# Patient Record
Sex: Female | Born: 1985 | Race: White | Hispanic: No | Marital: Married | Smoking: Never smoker
Health system: Southern US, Community
[De-identification: ages and names within clinical notes are randomized; demographics above are authoritative.]

## PROBLEM LIST (undated history)

## (undated) ENCOUNTER — Inpatient Hospital Stay: Payer: Self-pay

## (undated) DIAGNOSIS — R001 Bradycardia, unspecified: Secondary | ICD-10-CM

## (undated) DIAGNOSIS — D649 Anemia, unspecified: Secondary | ICD-10-CM

## (undated) DIAGNOSIS — I10 Essential (primary) hypertension: Secondary | ICD-10-CM

## (undated) DIAGNOSIS — E119 Type 2 diabetes mellitus without complications: Secondary | ICD-10-CM

## (undated) DIAGNOSIS — R55 Syncope and collapse: Secondary | ICD-10-CM

## (undated) DIAGNOSIS — K56609 Unspecified intestinal obstruction, unspecified as to partial versus complete obstruction: Secondary | ICD-10-CM

## (undated) DIAGNOSIS — R519 Headache, unspecified: Secondary | ICD-10-CM

## (undated) DIAGNOSIS — G4730 Sleep apnea, unspecified: Secondary | ICD-10-CM

## (undated) DIAGNOSIS — K432 Incisional hernia without obstruction or gangrene: Secondary | ICD-10-CM

## (undated) DIAGNOSIS — R42 Dizziness and giddiness: Secondary | ICD-10-CM

## (undated) DIAGNOSIS — I959 Hypotension, unspecified: Secondary | ICD-10-CM

## (undated) DIAGNOSIS — G039 Meningitis, unspecified: Secondary | ICD-10-CM

## (undated) DIAGNOSIS — E7800 Pure hypercholesterolemia, unspecified: Secondary | ICD-10-CM

## (undated) DIAGNOSIS — Z87442 Personal history of urinary calculi: Secondary | ICD-10-CM

## (undated) DIAGNOSIS — F32A Depression, unspecified: Secondary | ICD-10-CM

## (undated) DIAGNOSIS — E282 Polycystic ovarian syndrome: Secondary | ICD-10-CM

## (undated) DIAGNOSIS — R51 Headache: Secondary | ICD-10-CM

## (undated) DIAGNOSIS — F329 Major depressive disorder, single episode, unspecified: Secondary | ICD-10-CM

## (undated) HISTORY — DX: Bradycardia, unspecified: R00.1

## (undated) HISTORY — PX: DIAGNOSTIC LAPAROSCOPY: SUR761

## (undated) HISTORY — PX: VENTRAL HERNIA REPAIR: SHX424

## (undated) HISTORY — PX: CHOLECYSTECTOMY: SHX55

## (undated) HISTORY — PX: COLON SURGERY: SHX602

## (undated) HISTORY — PX: TONSILLECTOMY: SUR1361

## (undated) HISTORY — PX: OOPHORECTOMY: SHX86

## (undated) HISTORY — PX: TONSILECTOMY, ADENOIDECTOMY, BILATERAL MYRINGOTOMY AND TUBES: SHX2538

## (undated) HISTORY — DX: Syncope and collapse: R55

## (undated) HISTORY — PX: HERNIA REPAIR: SHX51

---

## 2006-07-09 DIAGNOSIS — O1490 Unspecified pre-eclampsia, unspecified trimester: Secondary | ICD-10-CM

## 2007-07-27 ENCOUNTER — Other Ambulatory Visit: Payer: Self-pay

## 2007-07-27 ENCOUNTER — Emergency Department: Payer: Self-pay | Admitting: Emergency Medicine

## 2008-07-24 ENCOUNTER — Encounter: Payer: Self-pay | Admitting: Sports Medicine

## 2008-08-03 ENCOUNTER — Encounter: Payer: Self-pay | Admitting: Sports Medicine

## 2008-09-02 ENCOUNTER — Encounter: Payer: Self-pay | Admitting: Sports Medicine

## 2008-10-18 ENCOUNTER — Emergency Department: Payer: Self-pay | Admitting: Emergency Medicine

## 2009-05-28 ENCOUNTER — Emergency Department: Payer: Self-pay | Admitting: Emergency Medicine

## 2009-06-19 ENCOUNTER — Emergency Department: Payer: Self-pay | Admitting: Emergency Medicine

## 2009-06-26 ENCOUNTER — Emergency Department: Payer: Self-pay | Admitting: Unknown Physician Specialty

## 2009-09-19 ENCOUNTER — Emergency Department: Payer: Self-pay | Admitting: Emergency Medicine

## 2010-09-24 ENCOUNTER — Emergency Department: Payer: Self-pay | Admitting: Emergency Medicine

## 2011-03-06 ENCOUNTER — Emergency Department: Payer: Self-pay | Admitting: Emergency Medicine

## 2011-03-10 ENCOUNTER — Emergency Department: Payer: Self-pay

## 2011-05-15 ENCOUNTER — Emergency Department: Payer: Self-pay | Admitting: *Deleted

## 2011-06-25 ENCOUNTER — Emergency Department: Payer: Self-pay | Admitting: Emergency Medicine

## 2011-06-25 LAB — CBC
HCT: 46.4 % (ref 35.0–47.0)
HGB: 15.7 g/dL (ref 12.0–16.0)
MCH: 30.5 pg (ref 26.0–34.0)
MCV: 90 fL (ref 80–100)
RDW: 12.2 % (ref 11.5–14.5)
WBC: 8.1 10*3/uL (ref 3.6–11.0)

## 2011-06-25 LAB — HCG, QUANTITATIVE, PREGNANCY: Beta Hcg, Quant.: <1 m[IU]/mL — ABNORMAL LOW

## 2011-06-25 LAB — COMPREHENSIVE METABOLIC PANEL
Alkaline Phosphatase: 99 U/L (ref 50–136)
Bilirubin,Total: 0.5 mg/dL (ref 0.2–1.0)
Chloride: 101 mmol/L (ref 98–107)
Co2: 27 mmol/L (ref 21–32)
Creatinine: 0.75 mg/dL (ref 0.60–1.30)
EGFR (African American): >60
EGFR (Non-African Amer.): >60
Glucose: 290 mg/dL — ABNORMAL HIGH (ref 65–99)
SGPT (ALT): 76 U/L

## 2011-06-25 LAB — URINALYSIS, COMPLETE
Bilirubin,UR: NEGATIVE
Ketone: NEGATIVE
Ph: 5 (ref 4.5–8.0)
Protein: 30
Specific Gravity: 1.026 (ref 1.003–1.030)
Squamous Epithelial: 14

## 2011-07-29 ENCOUNTER — Emergency Department: Payer: Self-pay | Admitting: Emergency Medicine

## 2011-07-29 LAB — URINALYSIS, COMPLETE
Bacteria: NONE SEEN
Bilirubin,UR: NEGATIVE
Ph: 6 (ref 4.5–8.0)
Squamous Epithelial: 3
WBC UR: 10 /HPF (ref 0–5)

## 2011-07-29 LAB — CBC
HGB: 15.1 g/dL (ref 12.0–16.0)
MCH: 30.6 pg (ref 26.0–34.0)
MCHC: 34.1 g/dL (ref 32.0–36.0)
Platelet: 343 10*3/uL (ref 150–440)

## 2011-07-29 LAB — COMPREHENSIVE METABOLIC PANEL
Alkaline Phosphatase: 101 U/L (ref 50–136)
BUN: 10 mg/dL (ref 7–18)
Bilirubin,Total: 0.4 mg/dL (ref 0.2–1.0)
Co2: 25 mmol/L (ref 21–32)
Creatinine: 0.63 mg/dL (ref 0.60–1.30)
EGFR (Non-African Amer.): >60
Glucose: 290 mg/dL — ABNORMAL HIGH (ref 65–99)
Potassium: 3.7 mmol/L (ref 3.5–5.1)
SGPT (ALT): 83 U/L — ABNORMAL HIGH
Total Protein: 7.5 g/dL (ref 6.4–8.2)

## 2011-07-29 LAB — PREGNANCY, URINE: Pregnancy Test, Urine: NEGATIVE m[IU]/mL

## 2011-09-07 ENCOUNTER — Emergency Department: Payer: Self-pay | Admitting: Unknown Physician Specialty

## 2012-07-28 DIAGNOSIS — E785 Hyperlipidemia, unspecified: Secondary | ICD-10-CM | POA: Insufficient documentation

## 2012-08-23 ENCOUNTER — Ambulatory Visit: Payer: Self-pay | Admitting: Internal Medicine

## 2012-09-14 DIAGNOSIS — G4733 Obstructive sleep apnea (adult) (pediatric): Secondary | ICD-10-CM | POA: Insufficient documentation

## 2013-01-31 ENCOUNTER — Emergency Department: Payer: Self-pay | Admitting: Emergency Medicine

## 2013-02-07 ENCOUNTER — Emergency Department: Payer: Self-pay | Admitting: Emergency Medicine

## 2013-02-08 LAB — URINALYSIS, COMPLETE
Bilirubin,UR: NEGATIVE
Glucose,UR: >=500 mg/dL
Ketone: NEGATIVE
Leukocyte Esterase: NEGATIVE
Nitrite: NEGATIVE
Ph: 5
Protein: 30
RBC,UR: 4 /HPF
Specific Gravity: 1.033
Squamous Epithelial: 11
WBC UR: 4 /HPF

## 2013-03-04 DIAGNOSIS — IMO0002 Reserved for concepts with insufficient information to code with codable children: Secondary | ICD-10-CM | POA: Insufficient documentation

## 2013-05-05 DIAGNOSIS — G039 Meningitis, unspecified: Secondary | ICD-10-CM

## 2013-05-05 HISTORY — DX: Meningitis, unspecified: G03.9

## 2013-06-22 ENCOUNTER — Emergency Department: Payer: Self-pay | Admitting: Emergency Medicine

## 2013-07-10 DIAGNOSIS — R519 Headache, unspecified: Secondary | ICD-10-CM | POA: Insufficient documentation

## 2013-07-15 DIAGNOSIS — I10 Essential (primary) hypertension: Secondary | ICD-10-CM | POA: Insufficient documentation

## 2013-07-22 DIAGNOSIS — F329 Major depressive disorder, single episode, unspecified: Secondary | ICD-10-CM | POA: Insufficient documentation

## 2013-07-22 DIAGNOSIS — E039 Hypothyroidism, unspecified: Secondary | ICD-10-CM | POA: Insufficient documentation

## 2014-04-07 DIAGNOSIS — H6983 Other specified disorders of Eustachian tube, bilateral: Secondary | ICD-10-CM | POA: Insufficient documentation

## 2014-04-07 DIAGNOSIS — IMO0001 Reserved for inherently not codable concepts without codable children: Secondary | ICD-10-CM | POA: Insufficient documentation

## 2014-04-07 DIAGNOSIS — G43909 Migraine, unspecified, not intractable, without status migrainosus: Secondary | ICD-10-CM | POA: Insufficient documentation

## 2014-05-25 DIAGNOSIS — N946 Dysmenorrhea, unspecified: Secondary | ICD-10-CM | POA: Insufficient documentation

## 2014-05-25 DIAGNOSIS — K625 Hemorrhage of anus and rectum: Secondary | ICD-10-CM | POA: Insufficient documentation

## 2014-06-26 ENCOUNTER — Emergency Department: Payer: Self-pay | Admitting: Emergency Medicine

## 2014-06-26 ENCOUNTER — Emergency Department (HOSPITAL_COMMUNITY)
Admission: EM | Admit: 2014-06-26 | Discharge: 2014-06-26 | Disposition: A | Discharge status: Home / Self Care | Payer: BC Managed Care – PPO | Attending: Emergency Medicine | Admitting: Emergency Medicine

## 2014-06-26 ENCOUNTER — Encounter (HOSPITAL_COMMUNITY): Payer: Self-pay | Admitting: Emergency Medicine

## 2014-06-26 ENCOUNTER — Emergency Department (HOSPITAL_COMMUNITY): Payer: BC Managed Care – PPO

## 2014-06-26 DIAGNOSIS — Z794 Long term (current) use of insulin: Secondary | ICD-10-CM | POA: Insufficient documentation

## 2014-06-26 DIAGNOSIS — R1031 Right lower quadrant pain: Secondary | ICD-10-CM | POA: Diagnosis not present

## 2014-06-26 DIAGNOSIS — Z79899 Other long term (current) drug therapy: Secondary | ICD-10-CM | POA: Diagnosis not present

## 2014-06-26 DIAGNOSIS — Z880 Allergy status to penicillin: Secondary | ICD-10-CM | POA: Insufficient documentation

## 2014-06-26 DIAGNOSIS — Z9889 Other specified postprocedural states: Secondary | ICD-10-CM | POA: Diagnosis not present

## 2014-06-26 DIAGNOSIS — R109 Unspecified abdominal pain: Secondary | ICD-10-CM | POA: Diagnosis present

## 2014-06-26 DIAGNOSIS — Z9049 Acquired absence of other specified parts of digestive tract: Secondary | ICD-10-CM | POA: Diagnosis not present

## 2014-06-26 DIAGNOSIS — E119 Type 2 diabetes mellitus without complications: Secondary | ICD-10-CM | POA: Insufficient documentation

## 2014-06-26 HISTORY — DX: Type 2 diabetes mellitus without complications: E11.9

## 2014-06-26 LAB — CBC WITH DIFFERENTIAL/PLATELET
BASOS PCT: 0 % (ref 0–1)
Basophils Absolute: 0 10*3/uL (ref 0.0–0.1)
Eosinophils Absolute: 0.2 10*3/uL (ref 0.0–0.7)
Eosinophils Relative: 3 % (ref 0–5)
HCT: 38.9 % (ref 36.0–46.0)
HEMOGLOBIN: 13.7 g/dL (ref 12.0–15.0)
LYMPHS ABS: 3.4 10*3/uL (ref 0.7–4.0)
Lymphocytes Relative: 36 % (ref 12–46)
MCH: 30.2 pg (ref 26.0–34.0)
MCHC: 35.2 g/dL (ref 30.0–36.0)
MCV: 85.9 fL (ref 78.0–100.0)
MONO ABS: 0.6 10*3/uL (ref 0.1–1.0)
MONOS PCT: 6 % (ref 3–12)
Neutro Abs: 5.2 10*3/uL (ref 1.7–7.7)
Neutrophils Relative %: 55 % (ref 43–77)
Platelets: 359 10*3/uL (ref 150–400)
RBC: 4.53 MIL/uL (ref 3.87–5.11)
RDW: 12.4 % (ref 11.5–15.5)
WBC: 9.4 10*3/uL (ref 4.0–10.5)

## 2014-06-26 LAB — URINALYSIS, ROUTINE W REFLEX MICROSCOPIC
Bilirubin Urine: NEGATIVE
GLUCOSE, UA: 250 mg/dL — AB
HGB URINE DIPSTICK: NEGATIVE
KETONES UR: NEGATIVE mg/dL
LEUKOCYTES UA: NEGATIVE
Nitrite: NEGATIVE
Protein, ur: NEGATIVE mg/dL
Specific Gravity, Urine: 1.025 (ref 1.005–1.030)
UROBILINOGEN UA: 1 mg/dL (ref 0.0–1.0)
pH: 5.5 (ref 5.0–8.0)

## 2014-06-26 LAB — LIPASE, BLOOD: LIPASE: 23 U/L (ref 11–59)

## 2014-06-26 MED ORDER — HYDROCODONE-ACETAMINOPHEN 5-325 MG PO TABS: 1.0000 | ORAL_TABLET | Freq: Four times a day (QID) | ORAL | Status: DC | PRN

## 2014-06-26 MED ORDER — IOHEXOL 300 MG/ML  SOLN
100.0000 mL | Freq: Once | INTRAMUSCULAR | Status: AC | PRN
  Administered 2014-06-26: 100 mL via INTRAVENOUS

## 2014-06-26 MED ORDER — SODIUM CHLORIDE 0.9 % IV BOLUS (SEPSIS)
1000.0000 mL | Freq: Once | INTRAVENOUS | Status: AC
  Administered 2014-06-26: 1000 mL via INTRAVENOUS

## 2014-06-26 MED ORDER — IOHEXOL 300 MG/ML  SOLN
25.0000 mL | Freq: Once | INTRAMUSCULAR | Status: AC | PRN
  Administered 2014-06-26: 25 mL via ORAL

## 2014-06-26 MED ORDER — PROMETHAZINE HCL 25 MG PO TABS: 25.0000 mg | ORAL_TABLET | Freq: Three times a day (TID) | ORAL | Status: DC | PRN

## 2014-06-26 NOTE — ED Provider Notes (Signed)
CSN: [id number]     Arrival date & time 06/26/14  1120 History   First MD Initiated Contact with Patient 06/26/14 1214     Chief Complaint  Patient presents with  . Abdominal Pain     (Consider location/radiation/quality/duration/timing/severity/associated sxs/prior Treatment) HPI Patient presents to the emergency department with right-sided abdominal pain that started yesterday.  The patient states [facility] and then went to [facility] this morning.  She was sent here due to the fact that the CT scanner at [facility] does not support her weight.  Patient is transferred in by EMS.  Patient states that she has not had any chest pain, shortness of breath, vomiting,weakness, dizziness, headache, blurred vision, fever, back pain, dysuria, hematemesis, bloody stool, rash, or syncope.  Patient states that she did not take any medications prior to arrival and seems make her condition better, but palpation makes the pain worse Past Medical History  Diagnosis Date  . Diabetes mellitus without complication    Past Surgical History  Procedure Laterality Date  . Cholecystectomy    . Cesarean section    . Tonsillectomy     No family history on file. History  Substance Use Topics  . Smoking status: Never Smoker   . Smokeless tobacco: Not on file  . Alcohol Use: No   OB History    No data available     Review of Systems  All other systems negative except as documented in the HPI. All pertinent positives and negatives as reviewed in the HPI.  Allergies  Cefdinir and Penicillins  Home Medications   Prior to Admission medications   Medication Sig Start Date End Date Taking? Authorizing Provider  etonogestrel (NEXPLANON) 68 MG IMPL implant 1 each by Subdermal route once.   Yes Historical Provider, MD  insulin glargine (LANTUS) 100 UNIT/ML injection Inject 64 Units into the skin at bedtime.   Yes Historical  Provider, MD  metFORMIN (GLUCOPHAGE) 500 MG tablet Take 500 mg by mouth 2 (two) times daily with a meal.   Yes Historical Provider, MD  propranolol (INDERAL) 20 MG tablet Take 10 mg by mouth 2 (two) times daily.   Yes Historical Provider, MD  sertraline (ZOLOFT) 100 MG tablet Take 200 mg by mouth daily.   Yes Historical Provider, MD   BP 124/53 mmHg  Pulse 63  Temp(Src) 99.1 F (37.3 C) (Oral)  Resp 16  Ht 6\' 1"  (1.854 m)  Wt 404 lb (183.253 kg)  BMI 53.31 kg/m2  SpO2 97%  LMP 06/14/2014 Physical Exam  Constitutional: She is oriented to person, place, and time. She appears well-developed and well-nourished. No distress.  HENT:  Head: Normocephalic and atraumatic.  Mouth/Throat: Oropharynx is clear and moist.  Eyes: Pupils are equal, round, and reactive to light.  Neck: Normal range of motion. Neck supple.  Cardiovascular: Normal rate and regular rhythm.   Pulmonary/Chest: Effort normal and breath sounds normal. No respiratory distress. She has no wheezes.  Abdominal: Soft. Bowel sounds are normal. She exhibits no distension. There is tenderness. There is no rebound and no guarding.  Musculoskeletal: She exhibits no edema.  Neurological: She is alert and oriented to person, place, and time. She exhibits normal muscle tone. Coordination normal.  Skin: Skin is warm and dry. No rash noted. No erythema.  Nursing note and vitals reviewed.   ED Course  Procedures (including critical care time) Labs Review Labs Reviewed  URINALYSIS, ROUTINE W REFLEX MICROSCOPIC -  Abnormal; Notable for the following:    Glucose, UA 250 (*)    All other components within normal limits  CBC WITH DIFFERENTIAL/PLATELET  LIPASE, BLOOD    Imaging Review Ct Abdomen Pelvis W Contrast  06/26/2014   CLINICAL DATA:  Acute right lower quadrant abdominal pain.  EXAM: CT ABDOMEN AND PELVIS WITH CONTRAST  TECHNIQUE: Multidetector CT imaging of the abdomen and pelvis was performed using the standard protocol  following bolus administration of intravenous contrast.  CONTRAST:  OMNIPAQUE IOHEXOL 300 MG/ML  SOLN  COMPARISON:  CT scan of October 19, 2008.  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 Visualized lung bases appear normal.  Status post cholecystectomy. Fatty infiltration of the liver is noted. The spleen and pancreas appear normal. Adrenal glands and kidneys appear normal. No hydronephrosis or renal obstruction is noted. Appendix appears normal. There is no evidence of bowel obstruction. No abnormal fluid collection is noted. Uterus and ovaries appear normal. Urinary bladder is decompressed. No significant adenopathy is noted.  IMPRESSION: Fatty infiltration of the liver. No other significant abnormality seen in the abdomen or pelvis.   Electronically Signed   By: Lupita Raider, M.D.   On: 06/26/2014 14:42    The patient will be discharged home with follow-up with her primary care Dr. told to return here as needed.  I advised the patient.  Increase her fluid intake and rest as much possible and is negative CT scan findings and normal laboratory values here in the emergency department.  I reviewed her testing from her visit to [facility] and her laboratory testing was normal at that time as well  MDM   Final diagnoses:  RLQ abdominal pain      Carlyle Dolly, PA-C 06/26/14 1459  Richardean Canal, MD 06/26/14 [phone]

## 2014-06-26 NOTE — ED Notes (Signed)
Family at bedside. 

## 2014-06-26 NOTE — Discharge Instructions (Signed)
Return here as needed.  Follow-up with your primary care doctor.  The CT scan did not show any significant abnormalities °

## 2014-06-26 NOTE — ED Notes (Signed)
Pt in cT

## 2014-06-26 NOTE — ED Notes (Signed)
PT REQUEST md TO COME TO BEDSIDE BEFORE d/c

## 2014-06-26 NOTE — ED Notes (Signed)
Pt was sent here from [facility] for a CT Scan. PT started having RLQ pain this AM with nausea and diarrhea x 10. Denies any vomiting.

## 2014-06-26 NOTE — ED Notes (Signed)
Pt up ambulatory to the bathroom to attempt an urine specimen

## 2014-07-14 ENCOUNTER — Ambulatory Visit: Payer: Self-pay | Admitting: Gastroenterology

## 2014-08-28 LAB — SURGICAL PATHOLOGY

## 2015-02-12 ENCOUNTER — Other Ambulatory Visit: Payer: Self-pay | Admitting: Otolaryngology

## 2015-02-12 DIAGNOSIS — H9319 Tinnitus, unspecified ear: Secondary | ICD-10-CM

## 2015-02-16 ENCOUNTER — Ambulatory Visit: Payer: BC Managed Care – PPO

## 2015-02-20 ENCOUNTER — Ambulatory Visit
Admission: RE | Admit: 2015-02-20 | Discharge: 2015-02-20 | Disposition: A | Discharge status: Home / Self Care | Payer: BC Managed Care – PPO | Source: Ambulatory Visit | Attending: Otolaryngology | Admitting: Otolaryngology

## 2015-02-20 DIAGNOSIS — R42 Dizziness and giddiness: Secondary | ICD-10-CM | POA: Diagnosis present

## 2015-02-20 DIAGNOSIS — R51 Headache: Secondary | ICD-10-CM | POA: Insufficient documentation

## 2015-02-20 DIAGNOSIS — H9319 Tinnitus, unspecified ear: Secondary | ICD-10-CM | POA: Insufficient documentation

## 2015-03-27 DIAGNOSIS — E119 Type 2 diabetes mellitus without complications: Secondary | ICD-10-CM | POA: Insufficient documentation

## 2015-04-05 HISTORY — PX: GASTRIC BYPASS: SHX52

## 2015-04-11 ENCOUNTER — Other Ambulatory Visit: Payer: Self-pay | Admitting: Specialist

## 2015-04-16 ENCOUNTER — Other Ambulatory Visit: Payer: Self-pay | Admitting: Specialist

## 2015-04-16 ENCOUNTER — Ambulatory Visit
Admission: RE | Admit: 2015-04-16 | Discharge: 2015-04-16 | Disposition: A | Discharge status: Home / Self Care | Payer: BC Managed Care – PPO | Source: Ambulatory Visit | Attending: Specialist | Admitting: Specialist

## 2015-04-20 DIAGNOSIS — E559 Vitamin D deficiency, unspecified: Secondary | ICD-10-CM | POA: Insufficient documentation

## 2015-08-30 DIAGNOSIS — Z8679 Personal history of other diseases of the circulatory system: Secondary | ICD-10-CM | POA: Insufficient documentation

## 2015-08-30 DIAGNOSIS — Z8742 Personal history of other diseases of the female genital tract: Secondary | ICD-10-CM | POA: Insufficient documentation

## 2015-09-13 DIAGNOSIS — Z9884 Bariatric surgery status: Secondary | ICD-10-CM | POA: Insufficient documentation

## 2015-09-20 ENCOUNTER — Encounter: Payer: Self-pay | Admitting: Emergency Medicine

## 2015-09-20 ENCOUNTER — Emergency Department
Admission: EM | Admit: 2015-09-20 | Discharge: 2015-09-20 | Disposition: A | Discharge status: Home / Self Care | Payer: BC Managed Care – PPO | Attending: Emergency Medicine | Admitting: Emergency Medicine

## 2015-09-20 ENCOUNTER — Emergency Department: Payer: BC Managed Care – PPO

## 2015-09-20 DIAGNOSIS — Z7984 Long term (current) use of oral hypoglycemic drugs: Secondary | ICD-10-CM | POA: Diagnosis not present

## 2015-09-20 DIAGNOSIS — Z79899 Other long term (current) drug therapy: Secondary | ICD-10-CM | POA: Diagnosis not present

## 2015-09-20 DIAGNOSIS — Z794 Long term (current) use of insulin: Secondary | ICD-10-CM | POA: Diagnosis not present

## 2015-09-20 DIAGNOSIS — R102 Pelvic and perineal pain: Secondary | ICD-10-CM

## 2015-09-20 DIAGNOSIS — O26831 Pregnancy related renal disease, first trimester: Secondary | ICD-10-CM | POA: Diagnosis not present

## 2015-09-20 DIAGNOSIS — N1330 Unspecified hydronephrosis: Secondary | ICD-10-CM | POA: Insufficient documentation

## 2015-09-20 DIAGNOSIS — Z3A01 Less than 8 weeks gestation of pregnancy: Secondary | ICD-10-CM | POA: Insufficient documentation

## 2015-09-20 DIAGNOSIS — R319 Hematuria, unspecified: Secondary | ICD-10-CM | POA: Insufficient documentation

## 2015-09-20 DIAGNOSIS — R300 Dysuria: Secondary | ICD-10-CM

## 2015-09-20 DIAGNOSIS — E119 Type 2 diabetes mellitus without complications: Secondary | ICD-10-CM | POA: Insufficient documentation

## 2015-09-20 LAB — COMPREHENSIVE METABOLIC PANEL
ALT: 19 U/L (ref 14–54)
AST: 18 U/L (ref 15–41)
Albumin: 4.4 g/dL (ref 3.5–5.0)
Alkaline Phosphatase: 62 U/L (ref 38–126)
Anion gap: 7 (ref 5–15)
BILIRUBIN TOTAL: 0.7 mg/dL (ref 0.3–1.2)
BUN: 11 mg/dL (ref 6–20)
CHLORIDE: 109 mmol/L (ref 101–111)
CO2: 23 mmol/L (ref 22–32)
CREATININE: 0.43 mg/dL — AB (ref 0.44–1.00)
Calcium: 9.1 mg/dL (ref 8.9–10.3)
Glucose, Bld: 87 mg/dL (ref 65–99)
POTASSIUM: 3.3 mmol/L — AB (ref 3.5–5.1)
Sodium: 139 mmol/L (ref 135–145)
TOTAL PROTEIN: 7.1 g/dL (ref 6.5–8.1)

## 2015-09-20 LAB — HCG, QUANTITATIVE, PREGNANCY: hCG, Beta Chain, Quant, S: 13487 m[IU]/mL — ABNORMAL HIGH (ref <5)

## 2015-09-20 LAB — WET PREP, GENITAL
Clue Cells Wet Prep HPF POC: NONE SEEN
SPERM: NONE SEEN
Trich, Wet Prep: NONE SEEN
YEAST WET PREP: NONE SEEN

## 2015-09-20 LAB — CBC
HCT: 37.6 % (ref 35.0–47.0)
Hemoglobin: 12.6 g/dL (ref 12.0–16.0)
MCH: 29.9 pg (ref 26.0–34.0)
MCHC: 33.4 g/dL (ref 32.0–36.0)
MCV: 89.5 fL (ref 80.0–100.0)
PLATELETS: 406 10*3/uL (ref 150–440)
RBC: 4.21 MIL/uL (ref 3.80–5.20)
RDW: 13 % (ref 11.5–14.5)
WBC: 10.5 10*3/uL (ref 3.6–11.0)

## 2015-09-20 LAB — CHLAMYDIA/NGC RT PCR (ARMC ONLY)
Chlamydia Tr: NOT DETECTED
N GONORRHOEAE: NOT DETECTED

## 2015-09-20 NOTE — ED Notes (Signed)
Pt sent here by PCP for possible ectopic. (about [redacted] weeks pregnant) Pt reports right lower abdominal pain and urinary frequency, reports dx with UTI. Pt reports very small amount of brownish-pink vaginal discharge.

## 2015-09-20 NOTE — Discharge Instructions (Signed)

## 2015-09-20 NOTE — ED Provider Notes (Addendum)
[facility] Emergency Department Provider Note   ____________________________________________  Time seen: Approximately 2:05 PM  I have reviewed the triage vital signs and the nursing notes.   HISTORY  Chief Complaint possible ectopic    HPI ANNELLA Fuller is a 30 y.o. 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 She says that the pain can last up to an hour and a half and is a maximum of 6 out of 10. 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 She says that she feels the urge to urinate but then goes to the bathroom and has minimal to no urine production. She has been on multiple antibiotics from her primary care doctor. She was just at her primary care doctors earlier today where she reported the right lower quadrant pain and was sent to the emergency department to rule out ectopic pregnancy. The patient is a G2 P1 without any history of miscarriage or sexually transmitted disease. She denies any pain at this time. Denies any nausea or vomiting. Says that she is also taking prenatal vitamins. York Spaniel that she was instructed to go to the emergency department to rule out the ectopic and then call her primary care doctor back if she was cleared that they may make the next steps in the decision-making about the dysuria. The patient showed me her latest urinalysis result which only showed blood and did not have any nitrite or leukocyte Estrace.She says the pain radiates through to her back. She says that she also has had this similar pain with her dysuria in the past which resolved. She says that she has been on multiple courses of antibiotics and that the symptoms do resolve after antibiotic treatment but always return. She says that she did not have any of these issues  prior to the surgery in December. Also with a small amount of pink to brownish blood after wiping this morning.  Past Medical History  Diagnosis Date  . Diabetes mellitus without complication (HCC)     There are no active problems to display for this patient.   Past Surgical History  Procedure Laterality Date  . Cholecystectomy    . Cesarean section    . Tonsillectomy      Current Outpatient Rx  Name  Route  Sig  Dispense  Refill  . etonogestrel (NEXPLANON) 68 MG IMPL implant   Subdermal   1 each by Subdermal route once.         Marland Kitchen HYDROcodone-acetaminophen (NORCO/VICODIN) 5-325 MG per tablet   Oral   Take 1 tablet by mouth every 6 (six) hours as needed for moderate pain.   15 tablet   0   . insulin glargine (LANTUS) 100 UNIT/ML injection   Subcutaneous   Inject 64 Units into the skin at bedtime.         . metFORMIN (GLUCOPHAGE) 500 MG tablet   Oral   Take 500 mg by mouth 2 (two) times daily with a meal.         . promethazine (PHENERGAN) 25 MG tablet   Oral   Take 1 tablet (25 mg total) by mouth every 8 (eight) hours as needed for nausea or vomiting.   15 tablet   0   . propranolol (INDERAL) 20 MG tablet   Oral  Take 10 mg by mouth 2 (two) times daily.         . sertraline (ZOLOFT) 100 MG tablet   Oral   Take 200 mg by mouth daily.           Allergies Cefdinir and Penicillins  No family history on file.  Social History Social History  Substance Use Topics  . Smoking status: Never Smoker   . Smokeless tobacco: None  . Alcohol Use: No    Review of Systems Constitutional: No fever/chills Eyes: No visual changes. ENT: No sore throat. Cardiovascular: Denies chest pain. Respiratory: Denies shortness of breath. Gastrointestinal:No nausea, no vomiting.  No diarrhea.  No constipation. Genitourinary: As above Musculoskeletal:As above Skin: Negative for rash. Neurological: Negative for headaches, focal weakness or numbness.  10-point ROS  otherwise negative.  ____________________________________________   PHYSICAL EXAM:  VITAL SIGNS: ED Triage Vitals  Enc Vitals Group     BP 09/20/15 1256 139/87 mmHg     Pulse Rate 09/20/15 1256 58     Resp 09/20/15 1256 16     Temp 09/20/15 1256 97.9 F (36.6 C)     Temp Source 09/20/15 1256 Oral     SpO2 09/20/15 1256 100 %     Weight 09/20/15 1256 287 lb (130.182 kg)     Height 09/20/15 1256 6\' 1"  (1.854 m)     Head Cir --      Peak Flow --      Pain Score 09/20/15 1257 6     Pain Loc --      Pain Edu? --      Excl. in GC? --     Constitutional: Alert and oriented. Well appearing and in no acute distress. Eyes: Conjunctivae are normal. PERRL. EOMI. Head: Atraumatic. Nose: No congestion/rhinnorhea. Mouth/Throat: Mucous membranes are moist.   Neck: No stridor. Cardiovascular: Normal rate, regular rhythm. Grossly normal heart sounds.   Respiratory: Normal respiratory effort.  No retractions. Lungs CTAB. Gastrointestinal: Soft and nontender. No distention.  No CVA tenderness. Genitourinary: Normal external examination. Speculum exam without any discharge or bleeding. Bimanual exam with a closed cervical os. No CMT. No uterine or adnexal tenderness or masses palpated. Musculoskeletal: No lower extremity tenderness nor edema.  No joint effusions. Neurologic:  Normal speech and language. No gross focal neurologic deficits are appreciated.  Skin:  Skin is warm, dry and intact. No rash noted. Psychiatric: Mood and affect are normal. Speech and behavior are normal.  ____________________________________________   LABS (all labs ordered are listed, but only abnormal results are displayed)  Labs Reviewed  WET PREP, GENITAL - Abnormal; Notable for the following:    WBC, Wet Prep HPF POC RARE (*)    All other components within normal limits  HCG, QUANTITATIVE, PREGNANCY - Abnormal; Notable for the following:    hCG, Beta Chain, Quant, S 13487 (*)    All other components within  normal limits  COMPREHENSIVE METABOLIC PANEL - Abnormal; Notable for the following:    Potassium 3.3 (*)    Creatinine, Ser 0.43 (*)    All other components within normal limits  CHLAMYDIA/NGC RT PCR ([facility] ONLY)  CBC  URINALYSIS COMPLETEWITH MICROSCOPIC ([facility] ONLY)  ABO/RH   ____________________________________________  EKG   ____________________________________________  RADIOLOGY   [place] Art/Ven Flow Abd Pelv Doppler (Final result) Result time: 09/20/15 16:04:22   Final result by Rad Results In Interface (09/20/15 16:04:22)   Narrative:   CLINICAL DATA: Pelvic pain. Frequency. UTI.  EXAM: RENAL / URINARY TRACT  ULTRASOUND COMPLETE  COMPARISON: CT 06/26/2014.  FINDINGS: Right Kidney:  Length: 15.0 cm. Echogenicity within normal limits. No mass. Mild right hydronephrosis.  Left Kidney:  Length: 16.1 cm. Echogenicity within normal limits. No mass or hydronephrosis visualized.  Bladder:  Bladder is nondistended. Ureteral jets could not be identified.  IMPRESSION: 1. Mild right hydronephrosis.  2. The kidneys are bilaterally prominent, this may be related to the patient's size. No focal renal cortical abnormality identified.   Electronically Signed By: Maisie Fus Register On: 09/20/2015 16:03          US OB Transvaginal (Final result) Result time: 09/20/15 16:05:14   Final result by Rad Results In Interface (09/20/15 16:05:14)   Narrative:   CLINICAL DATA: Right pelvic pain  EXAM: OBSTETRIC <14 WK [place] AND TRANSVAGINAL OB  US DOPPLER ULTRASOUND OF OVARIES  TECHNIQUE: Both transabdominal and transvaginal ultrasound examinations were performed for complete evaluation of the gestation as well as the maternal uterus, adnexal regions, and pelvic cul-de-sac. Transvaginal technique was performed to assess early pregnancy.  Color and duplex Doppler ultrasound was utilized to evaluate blood flow to the ovaries.  COMPARISON:  None.  FINDINGS: Intrauterine gestational sac: Single  Yolk sac: Yes  Embryo: Yes  Cardiac Activity: Yes  Heart Rate: 115 bpm  CRL:  3.5 mm  6 w 0 d         [place] EDC: 05/15/2016  Subchorionic hemorrhage: None visualized.  Maternal uterus/adnexae: Corpus luteal cysts noted within the right ovary. The left ovary appears normal.  A moderate amount of free fluid noted within the pelvis.  Pulsed Doppler evaluation of both ovaries demonstrates normal appearing low-resistance arterial and venous waveforms.  IMPRESSION: 1. Single living intrauterine gestation with an estimated gestational age of [redacted] weeks and 0 days. 2. Moderate free fluid noted within the pelvis. 3. No evidence for ovarian torsion.   Electronically Signed By: Signa Kell M.D. On: 09/20/2015 16:05          US OB Comp Less 14 Wks (Final result) Result time: 09/20/15 16:05:14   Final result by Rad Results In Interface (09/20/15 16:05:14)   Narrative:   CLINICAL DATA: Right pelvic pain  EXAM: OBSTETRIC <14 WK [place] AND TRANSVAGINAL OB  US DOPPLER ULTRASOUND OF OVARIES  TECHNIQUE: Both transabdominal and transvaginal ultrasound examinations were performed for complete evaluation of the gestation as well as the maternal uterus, adnexal regions, and pelvic cul-de-sac. Transvaginal technique was performed to assess early pregnancy.  Color and duplex Doppler ultrasound was utilized to evaluate blood flow to the ovaries.  COMPARISON: None.  FINDINGS: Intrauterine gestational sac: Single  Yolk sac: Yes  Embryo: Yes  Cardiac Activity: Yes  Heart Rate: 115 bpm  CRL:  3.5 mm  6 w 0 d         [place] EDC: 05/15/2016  Subchorionic hemorrhage: None visualized.  Maternal uterus/adnexae: Corpus luteal cysts noted within the right ovary. The left ovary appears normal.  A moderate amount of free fluid noted within the pelvis.  Pulsed Doppler evaluation of both  ovaries demonstrates normal appearing low-resistance arterial and venous waveforms.  IMPRESSION: 1. Single living intrauterine gestation with an estimated gestational age of [redacted] weeks and 0 days. 2. Moderate free fluid noted within the pelvis. 3. No evidence for ovarian torsion.   Electronically Signed By: Signa Kell M.D. On: 09/20/2015 16:05          US Renal (Final result) Result time: 09/20/15 16:04:33   Final result by Rad Results In Interface (09/20/15  16:04:33)   Narrative:   CLINICAL DATA: Right pelvic pain, frequency, urinary tract infection.  EXAM: RENAL / URINARY TRACT ULTRASOUND COMPLETE  COMPARISON: CT abdomen dated 06/26/2014.  FINDINGS: Right Kidney:  Length: 15 cm. Cortical thickness and echogenicity is within normal limits. Mild hydronephrosis. No renal stones seen.  Left Kidney:  Length: 16.1 cm. Cortical thickness and echogenicity is within normal limits. No hydronephrosis. No renal stones seen.  Bladder:  Decompressed.  IMPRESSION: 1. Mild right hydronephrosis. 2. Left kidney appears normal. 3. Bladder is decompressed. Per report, a pelvic ultrasound performed today showed floating debris in the bladder.   Electronically Signed By: Bary RichardStan Maynard M.D. On: 09/20/2015 16:04       ____________________________________________   PROCEDURES   ____________________________________________   INITIAL IMPRESSION / ASSESSMENT AND PLAN / ED COURSE  Pertinent labs & imaging results that were available during my care of the patient were reviewed by me and considered in my medical decision making (see chart for details).  ----------------------------------------- 4:53 PM on 09/20/2015 -----------------------------------------  Patient resting comfortable at this time and still without any pain. Explain to her the results of her ultrasound including the right-sided hydronephrosis as well as the intrauterine pregnancy at 6  weeks. I also discussed case with Dr. Marlou PorchHerrick of urology who does not recommend any intervention at this time. He says that she may be referred to urology for likely further workup after delivery. The patient will be following up with her primary care doctor, Dr. Ludwig ClarksSantayana.  The patient says that she'll be calling the patient tonight for further recommendations. No signs of UTI on the patient's urine dip today. Will defer to the primary care doctor for further workup and treatment for the patient's urinary issues. The patient will continue to take prenatal vitamins. Patient does have right lower quadrant tenderness palpation but this is an ongoing issue. No nausea or vomiting. Normal white count. I feel this is more likely related to her ongoing issues and is not consistent with acute appendicitis. ____________________________________________   FINAL CLINICAL IMPRESSION(S) / ED DIAGNOSES  Final diagnoses:  Pelvic pain in female  Dysuria.    NEW MEDICATIONS STARTED DURING THIS VISIT:  New Prescriptions   No medications on file     Note:  This document was prepared using Dragon voice recognition software and may include unintentional dictation errors.    Myrna Blazeravid Matthew Schaevitz, MD 09/20/15 1655  Possible diagnosis of kidney stone as well but with only mild hydronephrosis as well as normal renal function and normal white count without fever do not think that there is any further workup to be done at this point either.  Myrna Blazeravid Matthew Schaevitz, MD 09/20/15 [phone]

## 2015-09-21 LAB — ABO/RH: ABO/RH(D): O POS

## 2015-10-06 ENCOUNTER — Observation Stay
Admission: EM | Admit: 2015-10-06 | Discharge: 2015-10-08 | Disposition: A | Discharge status: Home / Self Care | Payer: BC Managed Care – PPO | Attending: Obstetrics and Gynecology | Admitting: Obstetrics and Gynecology

## 2015-10-06 ENCOUNTER — Emergency Department: Payer: BC Managed Care – PPO

## 2015-10-06 ENCOUNTER — Encounter: Payer: Self-pay | Admitting: Emergency Medicine

## 2015-10-06 DIAGNOSIS — N83201 Unspecified ovarian cyst, right side: Secondary | ICD-10-CM | POA: Diagnosis not present

## 2015-10-06 DIAGNOSIS — Z3A08 8 weeks gestation of pregnancy: Secondary | ICD-10-CM | POA: Diagnosis not present

## 2015-10-06 DIAGNOSIS — R300 Dysuria: Secondary | ICD-10-CM | POA: Insufficient documentation

## 2015-10-06 DIAGNOSIS — R3915 Urgency of urination: Secondary | ICD-10-CM | POA: Diagnosis not present

## 2015-10-06 DIAGNOSIS — Z9049 Acquired absence of other specified parts of digestive tract: Secondary | ICD-10-CM | POA: Diagnosis not present

## 2015-10-06 DIAGNOSIS — Z6836 Body mass index (BMI) 36.0-36.9, adult: Secondary | ICD-10-CM | POA: Insufficient documentation

## 2015-10-06 DIAGNOSIS — Z87440 Personal history of urinary (tract) infections: Secondary | ICD-10-CM | POA: Diagnosis not present

## 2015-10-06 DIAGNOSIS — Z9884 Bariatric surgery status: Secondary | ICD-10-CM | POA: Insufficient documentation

## 2015-10-06 DIAGNOSIS — R1031 Right lower quadrant pain: Secondary | ICD-10-CM | POA: Insufficient documentation

## 2015-10-06 DIAGNOSIS — O99211 Obesity complicating pregnancy, first trimester: Secondary | ICD-10-CM | POA: Diagnosis not present

## 2015-10-06 DIAGNOSIS — O3481 Maternal care for other abnormalities of pelvic organs, first trimester: Principal | ICD-10-CM | POA: Insufficient documentation

## 2015-10-06 DIAGNOSIS — R309 Painful micturition, unspecified: Secondary | ICD-10-CM

## 2015-10-06 DIAGNOSIS — N12 Tubulo-interstitial nephritis, not specified as acute or chronic: Secondary | ICD-10-CM | POA: Insufficient documentation

## 2015-10-06 DIAGNOSIS — Z9889 Other specified postprocedural states: Secondary | ICD-10-CM | POA: Insufficient documentation

## 2015-10-06 DIAGNOSIS — O26891 Other specified pregnancy related conditions, first trimester: Secondary | ICD-10-CM

## 2015-10-06 DIAGNOSIS — R109 Unspecified abdominal pain: Secondary | ICD-10-CM | POA: Diagnosis present

## 2015-10-06 DIAGNOSIS — O0991 Supervision of high risk pregnancy, unspecified, first trimester: Secondary | ICD-10-CM

## 2015-10-06 DIAGNOSIS — Z880 Allergy status to penicillin: Secondary | ICD-10-CM | POA: Diagnosis not present

## 2015-10-06 DIAGNOSIS — O2300 Infections of kidney in pregnancy, unspecified trimester: Secondary | ICD-10-CM | POA: Diagnosis present

## 2015-10-06 DIAGNOSIS — O2301 Infections of kidney in pregnancy, first trimester: Secondary | ICD-10-CM | POA: Diagnosis present

## 2015-10-06 DIAGNOSIS — R102 Pelvic and perineal pain: Secondary | ICD-10-CM

## 2015-10-06 DIAGNOSIS — R10A1 Flank pain, right side: Secondary | ICD-10-CM | POA: Diagnosis present

## 2015-10-06 DIAGNOSIS — R1011 Right upper quadrant pain: Secondary | ICD-10-CM | POA: Diagnosis present

## 2015-10-06 LAB — CBC
HCT: 36.5 % (ref 35.0–47.0)
Hemoglobin: 12 g/dL (ref 12.0–16.0)
MCH: 29.3 pg (ref 26.0–34.0)
MCHC: 33 g/dL (ref 32.0–36.0)
MCV: 88.9 fL (ref 80.0–100.0)
PLATELETS: 367 10*3/uL (ref 150–440)
RBC: 4.1 MIL/uL (ref 3.80–5.20)
RDW: 12.5 % (ref 11.5–14.5)
WBC: 15.4 10*3/uL — AB (ref 3.6–11.0)

## 2015-10-06 LAB — BASIC METABOLIC PANEL
Anion gap: 9 (ref 5–15)
BUN: 9 mg/dL (ref 6–20)
CALCIUM: 9.2 mg/dL (ref 8.9–10.3)
CO2: 22 mmol/L (ref 22–32)
Chloride: 107 mmol/L (ref 101–111)
Creatinine, Ser: 0.42 mg/dL — ABNORMAL LOW (ref 0.44–1.00)
GFR calc Af Amer: >60 mL/min (ref >60)
GLUCOSE: 101 mg/dL — AB (ref 65–99)
Potassium: 3.6 mmol/L (ref 3.5–5.1)
SODIUM: 138 mmol/L (ref 135–145)

## 2015-10-06 LAB — URINALYSIS COMPLETE WITH MICROSCOPIC (ARMC ONLY)
Bacteria, UA: NONE SEEN
Bilirubin Urine: NEGATIVE
GLUCOSE, UA: NEGATIVE mg/dL
KETONES UR: NEGATIVE mg/dL
LEUKOCYTES UA: NEGATIVE
NITRITE: NEGATIVE
PH: 5 (ref 5.0–8.0)
PROTEIN: NEGATIVE mg/dL
SPECIFIC GRAVITY, URINE: 1.019 (ref 1.005–1.030)

## 2015-10-06 LAB — HCG, QUANTITATIVE, PREGNANCY: HCG, BETA CHAIN, QUANT, S: 65444 m[IU]/mL — AB (ref <5)

## 2015-10-06 MED ORDER — LACTATED RINGERS IV SOLN
125.0000 mL/h | INTRAVENOUS | Status: DC
  Administered 2015-10-06 – 2015-10-08 (×4): 125 mL/h via INTRAVENOUS

## 2015-10-06 MED ORDER — MORPHINE SULFATE (PF) 2 MG/ML IV SOLN
2.0000 mg | INTRAVENOUS | Status: DC | PRN
  Administered 2015-10-06 – 2015-10-07 (×5): 2 mg via INTRAVENOUS
  Filled 2015-10-06 (×5): qty 1

## 2015-10-06 MED ORDER — DEXTROSE 5 % IV SOLN
1.0000 g | Freq: Once | INTRAVENOUS | Status: AC
  Administered 2015-10-06: 1 g via INTRAVENOUS
  Filled 2015-10-06: qty 1

## 2015-10-06 MED ORDER — SODIUM CHLORIDE 0.9 % IV BOLUS (SEPSIS)
1000.0000 mL | Freq: Once | INTRAVENOUS | Status: AC
  Administered 2015-10-06: 1000 mL via INTRAVENOUS

## 2015-10-06 MED ORDER — DEXTROSE 5 % IV SOLN
1.0000 g | Freq: Two times a day (BID) | INTRAVENOUS | Status: AC
  Administered 2015-10-07: 1 g via INTRAVENOUS
  Filled 2015-10-06: qty 1

## 2015-10-06 NOTE — ED Notes (Signed)
Pt reports being 8 weeks pregnancy and having a hard time urinating and had a fever of 101.3 last night. Reports Right flank pain denies radiation. Denies vaginal bleeding or abd pain.

## 2015-10-06 NOTE — Consult Note (Signed)
Consult: Right flank pain, right lower quadrant pain Requested by: Dr. Inocencio Homes  History of Present Illness: This is a 30 year old white female [redacted] weeks pregnant. She reports a several year history of frequency urgency and dysuria. This has been treated with very labile antibiotics. Of note, she developed some mild to moderate right flank pain and right lower quadrant pain about 2 weeks ago. She reports she had a high fever. She is afebrile here. She has no history of kidney stones. She has not seen a stone pass. She's had no gross hematuria. The pain comes and goes and is not always present. She underwent a renal ultrasound which showed no hydronephrosis. The central collecting system was just visible but the calyces and ureter were not dilated. There were ureteral jets. I reviewed the images. Her white count is 15, kidney function normal. UA showed 6-30 red cells and 6-30 white cells. No bacteria.  Past Medical History  Diagnosis Date  . Diabetes mellitus without complication [facility])    Past Surgical History  Procedure Laterality Date  . Cholecystectomy    . Cesarean section    . Tonsillectomy      Home Medications:   (Not in a hospital admission) Allergies:  Allergies  Allergen Reactions  . Cefdinir   . Penicillins     No family history on file. Social History:  reports that she has never smoked. She does not have any smokeless tobacco history on file. She reports that she does not drink alcohol or use illicit drugs.  ROS: A complete review of systems was performed.  All systems are negative except for pertinent findings as noted. ROS   Physical Exam:  Vital signs in last 24 hours: Temp:  [97.7 F (36.5 C)] 97.7 F (36.5 C) (06/03 0857) Pulse Rate:  [52-66] 64 (06/03 1415) Resp:  [18-20] 18 (06/03 1014) BP: (91-112)/(43-83) 106/55 mmHg (06/03 1400) SpO2:  [97 %-100 %] 100 % (06/03 1415) Weight:  [126.554 kg (279 lb)] 126.554 kg (279 lb) (06/03 0857) General:  Alert and  oriented, No acute distress Neck: No JVD or lymphadenopathy Cardiovascular: Regular rate and rhythm Lungs: Regular rate and effort Neurologic: Grossly intact  Laboratory Data:  Results for orders placed or performed during the hospital encounter of 10/06/15 (from the past 24 hour(s))  Urinalysis complete, with microscopic- may I&O cath if menses     Status: Abnormal   Collection Time: 10/06/15  9:02 AM  Result Value Ref Range   Color, Urine YELLOW (A) YELLOW   APPearance CLOUDY (A) CLEAR   Glucose, UA NEGATIVE NEGATIVE mg/dL   Bilirubin Urine NEGATIVE NEGATIVE   Ketones, ur NEGATIVE NEGATIVE mg/dL   Specific Gravity, Urine 1.019 1.005 - 1.030   Hgb urine dipstick 1+ (A) NEGATIVE   pH 5.0 5.0 - 8.0   Protein, ur NEGATIVE NEGATIVE mg/dL   Nitrite NEGATIVE NEGATIVE   Leukocytes, UA NEGATIVE NEGATIVE   RBC / HPF 6-30 0 - 5 RBC/hpf   WBC, UA 6-30 0 - 5 WBC/hpf   Bacteria, UA NONE SEEN NONE SEEN   Squamous Epithelial / LPF 6-30 (A) NONE SEEN   Mucous PRESENT    Uric Acid Crys, UA PRESENT   hCG, quantitative, pregnancy     Status: Abnormal   Collection Time: 10/06/15  9:02 AM  Result Value Ref Range   hCG, Beta Chain, Quant, S 65444 (H) <5 mIU/mL  Basic metabolic panel     Status: Abnormal   Collection Time: 10/06/15  9:03 AM  Result Value Ref Range   Sodium 138 135 - 145 mmol/L   Potassium 3.6 3.5 - 5.1 mmol/L   Chloride 107 101 - 111 mmol/L   CO2 22 22 - 32 mmol/L   Glucose, Bld 101 (H) 65 - 99 mg/dL   BUN 9 6 - 20 mg/dL   Creatinine, Ser 9.140.42 (L) 0.44 - 1.00 mg/dL   Calcium 9.2 8.9 - 78.210.3 mg/dL   GFR calc non Af Amer >60 >60 mL/min   GFR calc Af Amer >60 >60 mL/min   Anion gap 9 5 - 15  CBC     Status: Abnormal   Collection Time: 10/06/15  9:03 AM  Result Value Ref Range   WBC 15.4 (H) 3.6 - 11.0 K/uL   RBC 4.10 3.80 - 5.20 MIL/uL   Hemoglobin 12.0 12.0 - 16.0 g/dL   HCT 95.636.5 21.335.0 - 08.647.0 %   MCV 88.9 80.0 - 100.0 fL   MCH 29.3 26.0 - 34.0 pg   MCHC 33.0 32.0 - 36.0  g/dL   RDW 57.812.5 46.911.5 - 62.914.5 %   Platelets 367 150 - 440 K/uL   No results found for this or any previous visit (from the past 240 hour(s)). Creatinine:  Recent Labs  10/06/15 0903  CREATININE 0.42*    Impression/Assessment/Plan: -Dysuria, urgency-this is more chronic. Agree with urine culture and prophylactic IV antibiotics because of history of fever. Possible pyelonephritis although a white count of 15 may be normal during pregnancy.  -Right flank pain, right lower quadrant pain-extensive differential diagnosis. Ultrasound did not appear concerning for significant obstruction. Possible small stone passing or possible pyelonephritis are two GU possibitilites. Could be musculoskeletal. If it is a stone, most stones pass during pregnancy with supportive care. Agree with fluids and pain medicine. No need for urgent urological intervention. I think if she remains stable without changing clinical symptoms or fever, she could be d/c'd in AM.   Maria Fuller 10/06/2015, 2:39 PM

## 2015-10-06 NOTE — ED Provider Notes (Signed)
[facility] Emergency Department Provider Note   ____________________________________________  Time seen: Approximately 9:43 AM  I have reviewed the triage vital signs and the nursing notes.   HISTORY  Chief Complaint Fever and Flank Pain    HPI Maria Fuller is a 30 y.o. female with history of diabetes, G2 P1 at 8 weeks estimated gestational age by first trimester ultrasound who presents for evaluation of 2 days of right flank pain and increased urinary hesitancy, gradual onset, constant, no modifying factors. Patient reports last night she also had a fever of 101.3 degrees Fahrenheit. No abdominal pain. No abnormal vaginal bleeding or vaginal discharge. No vomiting or diarrhea but she has had nausea. No chest pain or difficulty breathing. She does have issues with recurrent urinary tract infections.   Past Medical History  Diagnosis Date  . Diabetes mellitus without complication (HCC)     There are no active problems to display for this patient.   Past Surgical History  Procedure Laterality Date  . Cholecystectomy    . Cesarean section    . Tonsillectomy      Current Outpatient Rx  Name  Route  Sig  Dispense  Refill  . etonogestrel (NEXPLANON) 68 MG IMPL implant   Subdermal   1 each by Subdermal route once.         Marland Kitchen. HYDROcodone-acetaminophen (NORCO/VICODIN) 5-325 MG per tablet   Oral   Take 1 tablet by mouth every 6 (six) hours as needed for moderate pain.   15 tablet   0   . insulin glargine (LANTUS) 100 UNIT/ML injection   Subcutaneous   Inject 64 Units into the skin at bedtime.         . metFORMIN (GLUCOPHAGE) 500 MG tablet   Oral   Take 500 mg by mouth 2 (two) times daily with a meal.         . promethazine (PHENERGAN) 25 MG tablet   Oral   Take 1 tablet (25 mg total) by mouth every 8 (eight) hours as needed for nausea or vomiting.   15 tablet   0   . propranolol (INDERAL) 20 MG tablet   Oral   Take 10 mg by  mouth 2 (two) times daily.         . sertraline (ZOLOFT) 100 MG tablet   Oral   Take 200 mg by mouth daily.           Allergies Cefdinir and Penicillins  No family history on file.  Social History Social History  Substance Use Topics  . Smoking status: Never Smoker   . Smokeless tobacco: Not on file  . Alcohol Use: No    Review of Systems Constitutional: + fever, no chills Eyes: No visual changes. ENT: No sore throat. Cardiovascular: Denies chest pain. Respiratory: Denies shortness of breath. Gastrointestinal: No abdominal pain.  + nausea, no vomiting.  No diarrhea.  No constipation. Genitourinary: Negative for dysuria. Musculoskeletal: Negative for back pain. Skin: Negative for rash. Neurological: Negative for headaches, focal weakness or numbness.  10-point ROS otherwise negative.  ____________________________________________   PHYSICAL EXAM:  VITAL SIGNS: ED Triage Vitals  Enc Vitals Group     BP 10/06/15 0857 112/45 mmHg     Pulse Rate 10/06/15 0857 66     Resp 10/06/15 0857 20     Temp 10/06/15 0857 97.7 F (36.5 C)     Temp Source 10/06/15 0857 Oral     SpO2 10/06/15 0857 100 %  Weight 10/06/15 0857 279 lb (126.554 kg)     Height 10/06/15 0857  (1.854 m)     Head Cir --      Peak Flow --      Pain Score 10/06/15 0859 6     Pain Loc --      Pain Edu? --      Excl. in GC? --     Constitutional: Alert and oriented. Well appearing and in no acute distress. Eyes: Conjunctivae are normal. PERRL. EOMI. Head: Atraumatic. Nose: No congestion/rhinnorhea. Mouth/Throat: Mucous membranes are moist.  Oropharynx non-erythematous. Neck: No stridor.  Supple without meningismus. Cardiovascular: Normal rate, regular rhythm. Grossly normal heart sounds.  Good peripheral circulation. Respiratory: Normal respiratory effort.  No retractions. Lungs CTAB. Gastrointestinal: Soft and nontender. No distention.  No CVA tenderness. Genitourinary:  deferred Musculoskeletal: No lower extremity tenderness nor edema.  No joint effusions. Neurologic:  Normal speech and language. No gross focal neurologic deficits are appreciated. No gait instability. Skin:  Skin is warm, dry and intact. No rash noted. Psychiatric: Mood and affect are normal. Speech and behavior are normal.  ____________________________________________   LABS (all labs ordered are listed, but only abnormal results are displayed)  Labs Reviewed  URINALYSIS COMPLETEWITH MICROSCOPIC ([facility] ONLY) - Abnormal; Notable for the following:    Color, Urine YELLOW (*)    APPearance CLOUDY (*)    Hgb urine dipstick 1+ (*)    Squamous Epithelial / LPF 6-30 (*)    All other components within normal limits  BASIC METABOLIC PANEL - Abnormal; Notable for the following:    Glucose, Bld 101 (*)    Creatinine, Ser 0.42 (*)    All other components within normal limits  CBC - Abnormal; Notable for the following:    WBC 15.4 (*)    All other components within normal limits  HCG, QUANTITATIVE, PREGNANCY - Abnormal; Notable for the following:    hCG, Beta Chain, Quant, S 65444 (*)    All other components within normal limits  URINE CULTURE  CULTURE, BLOOD (ROUTINE X 2)  CULTURE, BLOOD (ROUTINE X 2)   ____________________________________________  EKG  none ____________________________________________  RADIOLOGY  US renal IMPRESSION: Mild fullness of the right renal collecting system, possibly early right hydronephrosis. ____________________________________________   PROCEDURES  Procedure(s) performed: None  Critical Care performed: No  ____________________________________________   INITIAL IMPRESSION / ASSESSMENT AND PLAN / ED COURSE  Pertinent labs & imaging results that were available during my care of the patient were reviewed by me and considered in my medical decision making (see chart for details).  Maria Fuller is a 30 y.o. female with history of  diabetes, G2 P1 at 8 weeks estimated gestational age by first trimester ultrasound who presents for evaluation of 2 days of right flank pain and increased urinary hesitancy. On exam, she is jelly well-appearing and in no acute distress. Vital signs stable, she is afebrile. She has a benign abdominal exam, no CVA tenderness. I reviewed her labs, CBC is remarkable for leukocytosis, white blood cell count is 15.4. CMP unremarkable. HCG elevated as expected in early pregnancy. Urinalysis with several red blood cells and white blood cells but also likely contaminated with squamous cells, no bacteria, negative leukocytes and nitrates, equivocal for UTI. Will obtain renal ultrasound, reassess for disposition.  ----------------------------------------- 2:53 PM on 10/06/2015 ----------------------------------------- Renal ultrasound again shows early right-sided hydro-nephrosis. I discussed the case with Dr. Mena Goes of urology and given the patient's equivocal UTI, complaining of fever,  leukocytosis and concern for possibly an obstructing stone versus pyelonephritis.  Dr. Mena Goes will evaluate the patient, he recommends, antibiotics and admission to OB/GYN. I discussed the case with Dr. Jean Rosenthal of OB/GYN and he will evaluate the patient and admit. I ordered a history of MS the patient has a history of allergic reaction including hives to both Cefdinir and penicillin.  ____________________________________________   FINAL CLINICAL IMPRESSION(S) / ED DIAGNOSES  Final diagnoses:  Acute right flank pain  Pyelonephritis      NEW MEDICATIONS STARTED DURING THIS VISIT:  New Prescriptions   No medications on file     Note:  This document was prepared using Dragon voice recognition software and may include unintentional dictation errors.    Gayla Doss, MD 10/06/15 1455

## 2015-10-06 NOTE — H&P (Signed)
OBSTETRICS/GYNECOLOGY ADMISSION HISTORY AND PHYSICAL  OB/GYN Admission  Attending Provider: Conard NovakJackson, Stephen D, MD   Maria Fuller [id number] 10/06/2015 3:51 PM    Chief Complaint:   Maria Fuller is a 30 y.o. G2P0101 at 6948w2d gestational age by a 3831w0d u/s on 09/20/15 who presents with right flank pain and a recent fever.    History of Present Ilness:   She has a complicated history of urinary tract infection for which she has been treated with multiple antibiotic regimens.  Most recently she was diagnosed with a UTI by her PCP (about two weeks ago). She was given fosfomycin and took one dose.  Her symptoms did not resolve. She states that she is to be referred to a urogynecologist for further evaluation.  In the past two weeks she has had worsening right flank/back pain for which she has taken tylenol and used a heating pad.  Yesterday she had a fever reported to be 101.29F with worsening back pain. She also reports a throbbing sort of pain in her lower anterior abdomen.  The pain is described as dull and aching.  It does not radiate.  She rates it as severe.  She notes mild nausea as an associated symptom.  She specifically denies emesis, vaginal bleeding or uterine cramping.  She is currently tolerating PO well.  Her last pregnancy was complicated by severe preeclampsia necessitating delivery by cesarean section at [redacted] weeks gestation 9 years ago.  Her current pregnancy is complicated by her obesity and history of diabetes and hypertension (both of which may be resolved as a result of losing 130+ pounds since her surgery).  Nothing seems to make her pain worse.  The heating pad and tylenol due help with the discomfort somewhat. When her symptoms began two weeks ago, she states she had hematuria. However, that is no longer present.  Past Medical History  Diagnosis Date  . Diabetes mellitus without complication (HCC)     states has resolved since her gastric bypass surgery (last A1c 4.6 two months  ago)   Past Surgical History  Procedure Laterality Date  . Cholecystectomy    . Cesarean section    . Tonsillectomy    . 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 Cefdinir Rash  . Penicillins Rash    Has patient had a PCN reaction causing immediate rash, facial/tongue/throat swelling, SOB or lightheadedness with hypotension: Yes Has patient had a PCN reaction causing severe rash involving mucus membranes or skin necrosis: No Has patient had a PCN reaction that required hospitalization No Has patient had a PCN reaction occurring within the last 10 years: No If all of the above answers are "NO", then may proceed with Cephalosporin use.   Prior to Admission medications   Medication Sig Start Date End Date Taking? Authorizing Provider  sertraline (ZOLOFT) 100 MG tablet Take 100 mg by mouth every morning.    Yes Historical Provider, MD    Obstetric History: She is a 532P0101 female with an obstetrical history as outlined in her HPI   Gynecologic History:  Denies a history of abnormal pap smears and STDs  Social History:  She  reports that she has never smoked. She does not have any smokeless tobacco history on file. She reports that she does not drink alcohol or use illicit drugs.  Family History:  Reviewed with patient. Reports only scattered diabetes and PCOS in multiple female family members.   Review of Systems:  Negative x 10  systems reviewed except as noted in the HPI.    Objective    BP 106/55 mmHg  Pulse 64  Temp(Src) 97.7 F (36.5 C) (Oral)  Resp 18  Ht  (1.854 m)  Wt 126.554 kg (279 lb)  BMI 36.82 kg/m2  SpO2 100%  LMP 08/11/2015 (Exact Date) Physical Exam  General:  She is a well appearing female in no distress. Hirsute.  HEENT:  Normocephalic, atraumatic.   Neck:  supple, no lymphadenopathy Cardiac:  RRR Pulmonary:  Clear to auscultation bilaterally. No wheezes, rales, rhonchi.   Abdomen:  Obese, soft, non-tender, non-distended, no CVAT  tenderness. No tenderness over her area of reported pain. Pelvic:  deferred Extremities:  Non-tender, symmetric no edema bilaterally.   Neurologic:  Alert & oriented x 3.  Appropriate, conversant.    Laboratory Results:   Lab Results  Component Value Date   WBC 15.4* 10/06/2015   RBC 4.10 10/06/2015   HGB 12.0 10/06/2015   HCT 36.5 10/06/2015   PLT 367 10/06/2015   NA 138 10/06/2015   K 3.6 10/06/2015   CREATININE 0.42* 10/06/2015   Lab Results  Component Value Date   APPEARANCEUR CLOUDY* 10/06/2015   GLUCOSEU NEGATIVE 10/06/2015   BILIRUBINUR NEGATIVE 10/06/2015   KETONESUR NEGATIVE 10/06/2015   LABSPEC 1.019 10/06/2015   HGBUR 1+* 10/06/2015   PHURINE 5.0 10/06/2015   NITRITE NEGATIVE 10/06/2015   LEUKOCYTESUR NEGATIVE 10/06/2015   RBCU 6-30 10/06/2015   WBCU 6-30 10/06/2015   BACTERIA NONE SEEN 10/06/2015   EPIU 6-30* 10/06/2015   MUCOUSUACOMP PRESENT 10/06/2015   Imaging Results:  US Renal  10/06/2015  CLINICAL DATA:  Right flank pain for 2 weeks EXAM: RENAL / URINARY TRACT ULTRASOUND COMPLETE COMPARISON:  None. FINDINGS: Right Kidney: Length: 15.9 cm. Mild fullness of the right renal collecting system, possibly early hydronephrosis. Normal echotexture. No mass. Left Kidney: Length: 14.4 cm. Echogenicity within normal limits. No mass or hydronephrosis visualized. Bladder: Appears normal for degree of bladder distention. IMPRESSION: Mild fullness of the right renal collecting system, possibly early right hydronephrosis. Electronically Signed   By: Charlett Nose M.D.   On: 10/06/2015 11:50     Assessment & Plan   Maria Fuller is a 30 y.o. G2P0101 female at [redacted]w[redacted]d gestational age with a complicated history of dysuria and UTI symptoms, today with right flank pain and reported history of fever.  Differential is wide.  Her recent surgery must be considered. She has no severe GI symptoms which might suggest a bowel obstruction or other complication from her surgery.  However,  if her fever and pain persists, imaging of her abdomen, possibly by CT, might be warranted to investigate any post-op issues.  Certainly pyelonephrosis has to be considered at the top of the list given her complicated urinary history and recent pain with fever.  Urology has seen the patient and did recommend admission with IV antibiosis as a measure in case this is her issue.  No direct obstetrical issues are obvious. Imaging will be warranted if she develops vaginal bleeding or uterine cramping.    1.  Admit for at least observation 2.  IV antibiotics with aztreonam.  Her dosing has been suggested to be 1g every 12 hours by pharmacy. Will continue this for now.  However, a Q8h dosing schedule has been suggested in the literature for pyelonephritis in pregnancy.   3. Monitor vitals.  No tylenol to mask fever. Will treat with IV pain meds, prn.  4. Regular diet and  will get a hemoglobin a1c, as add on if possible.  Conard Novak, MD 10/06/2015 3:51 PM

## 2015-10-06 NOTE — Progress Notes (Signed)
Pharmacy Antibiotic Note  Maria Fuller is a 30 y.o. female admitted on 10/06/2015 with pyelonephritis.  Pharmacy has been consulted for aztreonam dosing. Patient is [redacted] weeks pregnant with complaints of flank pain. She is allergic to penicillins and cephalosporins and reports rash to both. Aztreonam is pregnancy category B.  Plan: Aztreonam 1 g IV q12h  Height: 6\' 1"  (185.4 cm) Weight: 279 lb (126.554 kg) IBW/kg (Calculated) : 75.4  Temp (24hrs), Avg:97.7 F (36.5 C), Min:97.7 F (36.5 C), Max:97.7 F (36.5 C)   Recent Labs Lab 10/06/15 0903  WBC 15.4*  CREATININE 0.42*    Estimated Creatinine Clearance: 155.7 mL/min (by C-G formula based on Cr of 0.42).    Allergies  Allergen Reactions  . Cefdinir Rash  . Penicillins Rash    Has patient had a PCN reaction causing immediate rash, facial/tongue/throat swelling, SOB or lightheadedness with hypotension: Yes Has patient had a PCN reaction causing severe rash involving mucus membranes or skin necrosis: No Has patient had a PCN reaction that required hospitalization No Has patient had a PCN reaction occurring within the last 10 years: No If all of the above answers are "NO", then may proceed with Cephalosporin use.   Antimicrobials this admission: aztreonam 6/3 >>   Dose adjustments this admission:  Microbiology results: 6/3 BCx: Sent 6/3 UCx: Sent   Thank you for allowing pharmacy to be a part of this patient's care.  Cindi CarbonMary M Marny Smethers, PharmD Clinical Pharmacist 10/06/2015 3:10 PM

## 2015-10-07 ENCOUNTER — Observation Stay: Payer: BC Managed Care – PPO

## 2015-10-07 DIAGNOSIS — R1031 Right lower quadrant pain: Secondary | ICD-10-CM | POA: Diagnosis not present

## 2015-10-07 DIAGNOSIS — R309 Painful micturition, unspecified: Secondary | ICD-10-CM | POA: Diagnosis not present

## 2015-10-07 DIAGNOSIS — N12 Tubulo-interstitial nephritis, not specified as acute or chronic: Secondary | ICD-10-CM | POA: Diagnosis not present

## 2015-10-07 LAB — URINE CULTURE: Special Requests: NORMAL

## 2015-10-07 LAB — HEMOGLOBIN A1C: Hgb A1c MFr Bld: 4.9 % (ref 4.0–6.0)

## 2015-10-07 MED ORDER — PROGESTERONE MICRONIZED 100 MG PO CAPS
200.0000 mg | ORAL_CAPSULE | Freq: Two times a day (BID) | ORAL | Status: DC
  Administered 2015-10-07 – 2015-10-08 (×2): 200 mg via ORAL
  Filled 2015-10-07 (×2): qty 2

## 2015-10-07 MED ORDER — HYDROCODONE-ACETAMINOPHEN 5-325 MG PO TABS
1.0000 | ORAL_TABLET | ORAL | Status: DC | PRN
Start: 2015-10-07 — End: 2015-10-08
  Administered 2015-10-07: 1 via ORAL
  Filled 2015-10-07: qty 1

## 2015-10-07 MED ORDER — DEXTROSE 5 % IV SOLN
1.0000 g | Freq: Three times a day (TID) | INTRAVENOUS | Status: DC
  Administered 2015-10-07 – 2015-10-08 (×3): 1 g via INTRAVENOUS
  Filled 2015-10-07 (×5): qty 1

## 2015-10-07 NOTE — Progress Notes (Addendum)
Subjective/Objective Pt is resting comfortably in bed. She states that she only feels discomfort when she is urinating. It is not burning but a pressure and also unable to fully void feeling. She has been tolerating PO intake and she is ambulating without difficulty. She states her back pain has improved. Her pain is controlled with morphine injection.  ROS: Alert, oriented, no distress. No HA, SOB, N/V, F/C Normal effort of breathing No evidence of DVT   Scheduled Meds: . aztreonam  1 g Intravenous Q8H   Continuous Infusions: . lactated ringers 125 mL/hr (10/07/15 0420)   PRN Meds:morphine injection  Vital signs in last 24 hours: Temp:  [97.7 F (36.5 C)-99.2 F (37.3 C)] 97.7 F (36.5 C) (06/04 0724) Pulse Rate:  [52-71] 54 (06/04 0724) Resp:  [16-18] 16 (06/04 0724) BP: (91-112)/(43-83) 99/55 mmHg (06/04 0724) SpO2:  [97 %-100 %] 98 % (06/04 0724)  Intake/Output last 3 shifts: I/O last 3 completed shifts: In: -  Out: 825 [Urine:825] Intake/Output this shift:    Problem Assessment/Plan G2P1 at 6010w3d Continue with current plan of care Service: Obstetrics  Tresea MallGLEDHILL,Madellyn Denio, CNM

## 2015-10-07 NOTE — Progress Notes (Signed)
Pharmacy Antibiotic Note  Maria Fuller is a 30 y.o. female admitted on 10/06/2015 with pyelonephritis.  Pharmacy has been consulted for aztreonam dosing. Patient is [redacted] weeks pregnant with complaints of flank pain. She is allergic to penicillins and cephalosporins and reports rash to both. Aztreonam is pregnancy category B.  Plan: Will change dose to Aztreonam 1 g IV q8h   Height: 6\' 1"  (185.4 cm) Weight: 279 lb (126.554 kg) IBW/kg (Calculated) : 75.4  Temp (24hrs), Avg:98.5 F (36.9 C), Min:97.7 F (36.5 C), Max:99.2 F (37.3 C)   Recent Labs Lab 10/06/15 0903  WBC 15.4*  CREATININE 0.42*    Estimated Creatinine Clearance: 155.7 mL/min (by C-G formula based on Cr of 0.42).    Allergies  Allergen Reactions  . Cefdinir Rash  . Penicillins Rash    Has patient had a PCN reaction causing immediate rash, facial/tongue/throat swelling, SOB or lightheadedness with hypotension: Yes Has patient had a PCN reaction causing severe rash involving mucus membranes or skin necrosis: No Has patient had a PCN reaction that required hospitalization No Has patient had a PCN reaction occurring within the last 10 years: No If all of the above answers are "NO", then may proceed with Cephalosporin use.   Antimicrobials this admission: aztreonam 6/3 >>   Dose adjustments this admission:  Microbiology results: 6/3 BCx: Sent 6/3 UCx: Sent   Thank you for allowing pharmacy to be a part of this patient's care.  Marty HeckWang, Cleo Villamizar L, PharmD Clinical Pharmacist 10/07/2015 8:09 AM

## 2015-10-07 NOTE — Progress Notes (Signed)
Patient feeling stable, about the same as before. Reviewed u/s with no apparent pregnancy issues apart from a possible right ovarian corpus luteal cyst rupture. Discussed the uncertainty of this diagnosis and how it fits in to the overall picture.  However, it could explain some of her pelvic pain.   Will treat as if this is a ruptured CL cyst. There is a paucity of data on the effect of a ruptured CL cyst at this point in pregnancy.  Will start on prometrium 200mg  po bid until [redacted] weeks gestation age. Explained rationale to patient. She agrees with the plan.  Expect discharge tomorrow, if continues to be stable.  Thomasene MohairStephen Jackson, MD 10/07/2015 7:44 PM

## 2015-10-07 NOTE — Progress Notes (Signed)
Patient still having some significant pain, requiring iv pain medication.  Pain appears to be worse in her pelvis with continued dysuria and pain after voiding.   She has had no fevers or chills.  Back pain is somewhat improved, though she is requiring a heating pad at this point.   Will get pelvic u/s to assess position of uterus and any potential anatomic causes of pain.   Kidney u/s yesterday only mentioned bladder characteristics, no mention of uterus as incidental finding. Patient agrees with plan. Will continue in-house given severity of pain.  Also, no on Q8h antibiotics per pharmacy.  Will see if this resolves her pain issue so that she can be discharged.  Thomasene MohairStephen Naiyah Klostermann, MD 10/07/2015 3:15 PM

## 2015-10-07 NOTE — Progress Notes (Addendum)
Patient complains of right lower quadrant discomfort as well as urgency. No dysuria. She is remained afebrile.  Physical exam: She is sitting up in bed well. She is in no acute distress.  She underwent a transvaginal ultrasound which revealed "Suspect ruptured corpus luteum on the right with hemorrhagic fluid in the pelvis."   A/p: Urinary urgency-again this sounds like a chronic issue but could be a small distal stone. Her pain is controlled. Outpatient observation is not unreasonable in the setting of stable vitals and normal renal imaging.  Right lower quadrant pain-possibly related to rupture of corpus luteum. Other possibility related to GI tract or less likely distal stone. I suppose next up if she were to get fever or have worsening clinical could be MRI of the abdomen and pelvis. This may show the ureter, possibly a stone (if larger, filling defect on MRI) and bowel as well.  I discussed with Dr. Jean RosenthalJackson. I will sign off. Please call with any questions, concerns or change in patient's status.

## 2015-10-08 MED ORDER — LACTATED RINGERS IV BOLUS (SEPSIS)
500.0000 mL | Freq: Once | INTRAVENOUS | Status: AC
  Administered 2015-10-08: 500 mL via INTRAVENOUS

## 2015-10-08 MED ORDER — PROGESTERONE MICRONIZED 200 MG PO CAPS: 200.0000 mg | ORAL_CAPSULE | Freq: Two times a day (BID) | ORAL | Status: DC

## 2015-10-08 NOTE — Progress Notes (Signed)
Patient understands all discharge instructions and the need to attend follow up appointments. Patient escorted out with RN.

## 2015-10-08 NOTE — Discharge Summary (Signed)
DC Summary Discharge Summary   Patient ID: Maria Bongatasha K Schussler [id number] 30 y.o. 1985-05-08  Admit date: 10/06/2015  Discharge date: 10/08/2015  Principal Diagnoses:  1) high risk pregnancy in first trimester 2) [redacted] weeks gestational age 723) pyelonephritis  Secondary Diagnoses:  None  Procedures performed during the hospitalization:  U/S of kidneys, pelvic obstetrical u/s, urology consultation.  HPI: Maria Fuller is a 30 y.o. G2P0101 at 6633w2d gestational age by a 1345w0d u/s on 09/20/15 who presents with right flank pain and a recent fever. She has a complicated history of urinary tract infection for which she has been treated with multiple antibiotic regimens. Most recently she was diagnosed with a UTI by her PCP (about two weeks ago). She was given fosfomycin and took one dose. Her symptoms did not resolve. She states that she is to be referred to a urogynecologist for further evaluation. In the past two weeks she has had worsening right flank/back pain for which she has taken tylenol and used a heating pad. Yesterday she had a fever reported to be 101.27F with worsening back pain. She also reports a throbbing sort of pain in her lower anterior abdomen. The pain is described as dull and aching. It does not radiate. She rates it as severe. She notes mild nausea as an associated symptom. She specifically denies emesis, vaginal bleeding or uterine cramping. She is currently tolerating PO well. Her last pregnancy was complicated by severe preeclampsia necessitating delivery by cesarean section at [redacted] weeks gestation 9 years ago. Her current pregnancy is complicated by her obesity and history of diabetes and hypertension (both of which may be resolved as a result of losing 130+ pounds since her surgery). Nothing seems to make her pain worse. The heating pad and tylenol due help with the discomfort somewhat. When her symptoms began two weeks ago, she states she had hematuria. However, that is no  longer present.  Past Medical History  Diagnosis Date  . Diabetes mellitus without complication (HCC)     states has resolved since her gastric bypass surgery (last A1c 4.6 two months ago)    Past Surgical History  Procedure Laterality Date  . Cholecystectomy    . Cesarean section    . Tonsillectomy    . Gastric bypass  04/2015    Allergies  Allergen Reactions  . Cefdinir Rash  . Penicillins Rash    Has patient had a PCN reaction causing immediate rash, facial/tongue/throat swelling, SOB or lightheadedness with hypotension: Yes Has patient had a PCN reaction causing severe rash involving mucus membranes or skin necrosis: No Has patient had a PCN reaction that required hospitalization No Has patient had a PCN reaction occurring within the last 10 years: No If all of the above answers are "NO", then may proceed with Cephalosporin use.    Social History  Substance Use Topics  . Smoking status: Never Smoker   . Smokeless tobacco: None  . Alcohol Use: No   Family History: History reviewed. No pertinent family history.  Hospital Course:  Admitted for presumed pyelonephritis and was given aztreonam 1 gram initially every 12 hours, then switching to every 8 hours.  She remained afebrile throughout her hospitalization.  Her pain improved, though she did continue to have dysuria.  On hospital day #3 she required no pain medication (she did for the first 24-30 hours).  We discussed outpatient antibiotics in the setting of no clear diagnosis.  She certainly has risk factors for pyelonephritis.  However, she is not  really meeting diagnostic criteria. She had a ruptured ovarian cyst on her right side, which might explain her pain and partially her dysuria.  Discussed sending her home on bactrim with high dose folic acid ( ).  Also, discussed sending her home without bactrim with close follow up.  Discussed that there might be some potential teratogenic risk with bactrim.  Discussed that if  she goes home on no antibiotics she risks needing readmission. However, given any concrete findings to support pyelonephritis, it may be reasonable to send her home on no antibiotics.  After consideration, a mutual decision is made to send her home without antibiotics.  She voiced understanding of the risks of this.  However, I think it is a reasonable option given the overall clinical picture.   Discharge Exam: BP 105/53 mmHg  Pulse 54  Temp(Src) 97.7 F (36.5 C) (Oral)  Resp 16  Ht  (1.854 m)  Wt 126.554 kg (279 lb)  BMI 36.82 kg/m2  SpO2 100%  LMP 08/11/2015 (Exact Date) General  no apparent distress   CV  RRR   Pulmonary  clear to ausculatation bllaterally   Abdomen  Bowel sounds: present, soft, nontender, non-distended, no CVAT  Extremities  no edema, symmetric, SCDs in place    Condition at Discharge: Stable  Complications affecting treatment: None  Discharge Medications:    Medication List    STOP taking these medications        HYDROcodone-acetaminophen 5-325 MG tablet  Commonly known as:  NORCO/VICODIN     promethazine 25 MG tablet  Commonly known as:  PHENERGAN      TAKE these medications        progesterone 200 MG capsule  Commonly known as:  PROMETRIUM  Take 1 capsule (200 mg total) by mouth 2 (two) times daily.     sertraline 100 MG tablet  Commonly known as:  ZOLOFT  Take 100 mg by mouth every morning.        Follow-up Information    Follow up with Leola Brazil, MD In 2 days.   Specialty:  Obstetrics and Gynecology   Why:  keep current appointment   Contact information:   [address] [place] [address] [phone]      Discharge Disposition: home  Signed: Conard Novak, MD 10/08/2015 9:10 AM

## 2015-10-08 NOTE — Progress Notes (Signed)
Pt complains of pressure to urinate after voiding. Bladder scanner showed 50cc in bladder. Shirlean KellyJennifer Samiyyah Moffa RN

## 2015-10-11 LAB — CULTURE, BLOOD (ROUTINE X 2)
CULTURE: NO GROWTH
Culture: NO GROWTH

## 2015-10-18 ENCOUNTER — Ambulatory Visit
Admission: RE | Admit: 2015-10-18 | Discharge: 2015-10-18 | Disposition: A | Discharge status: Home / Self Care | Payer: BC Managed Care – PPO | Source: Ambulatory Visit | Attending: Obstetrics and Gynecology | Admitting: Obstetrics and Gynecology

## 2015-10-18 VITALS — BP 130/68 | HR 63 | Temp 98.2°F | Resp 18 | Ht 73.2 in | Wt 273.6 lb

## 2015-10-18 DIAGNOSIS — F32A Depression, unspecified: Secondary | ICD-10-CM | POA: Insufficient documentation

## 2015-10-18 DIAGNOSIS — F329 Major depressive disorder, single episode, unspecified: Secondary | ICD-10-CM

## 2015-10-18 DIAGNOSIS — O09299 Supervision of pregnancy with other poor reproductive or obstetric history, unspecified trimester: Secondary | ICD-10-CM | POA: Insufficient documentation

## 2015-10-18 DIAGNOSIS — O99341 Other mental disorders complicating pregnancy, first trimester: Secondary | ICD-10-CM | POA: Diagnosis not present

## 2015-10-18 DIAGNOSIS — O09291 Supervision of pregnancy with other poor reproductive or obstetric history, first trimester: Secondary | ICD-10-CM | POA: Diagnosis not present

## 2015-10-18 DIAGNOSIS — O99841 Bariatric surgery status complicating pregnancy, first trimester: Secondary | ICD-10-CM | POA: Insufficient documentation

## 2015-10-18 DIAGNOSIS — O34219 Maternal care for unspecified type scar from previous cesarean delivery: Secondary | ICD-10-CM | POA: Insufficient documentation

## 2015-10-18 NOTE — Progress Notes (Signed)
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 Maria Fuller is a 30 y.o. G2P0101 at [redacted]w[redacted]d by scan at [facility]  who presents in consultation from Dr Jean Rosenthal   for pregnancy after recent duodenal switch  with 140 lb weight loss.  Pt underwent gastric bypass in December after an office mate had excellent results . Pt was on insulin and her hgb A1c went form 9 to 4.9 now off meds.  She has lost 140 lbs overall  and has conintiued to lose 24  lbs during pregnancy. Overall pt is pleased that  she had the surgery but concerned that it not negatively impact the pregnancy.She is on 4 mg of folic acid and has her micronutrients checked here in [place] for iron, B12 . Recent hospitalization for pyelo - responded to antibiotics but cultures were negative - started on macrobid suppression .  PAST OB 30 yo [place] -7 lbs? at 34 weeks pt called her OB in [place] due to puffiness and a HA - she had BP 198/115 and was sent to [facility] - she underwent cesarean and was hospitalized for a week / She had pulmonary edema and receive diuretics that took "90 lbs" off of her . She did not require long term BP meds.    Past Medical History: Patient  has a past medical history of Diabetes mellitus without complication (HCC). h/o anxiety requiring antidepressants Zoloft  Past Surgical History: She  has past surgical history that includes Cholecystectomy; Cesarean section; Tonsillectomy; and Gastric bypass (04/2015).  Obstetric History:  OB History    Gravida Para Term Preterm AB TAB SAB Ectopic Multiple Living   Gynecologic History:  Patient's last menstrual period was 08/11/2015 (exact date).     Medications: vitamins, folic acid , zoloft  Allergies: Patient is allergic to cefdinir and penicillins. tolerated ceftriaxone iv  Social  History: Patient  reports that she has never smoked. She has never used smokeless tobacco. She reports that she does not drink alcohol or use illicit drugs.  Family History: family history is not on file.  Review of Systems A full 12 point review of systems was negative or as noted in the History of Present Illness.  Physical Exam: BP 130/68 mmHg  Pulse 63  Temp(Src) 98.2 F (36.8 C) (Oral)  Resp 18  Ht 6' 1.2" (1.859 m)  Wt 273 lb 9.6 oz (124.104 kg)  BMI 35.91 kg/m2  SpO2 100%  LMP 08/11/2015 (Exact Date) Heavy set WF - with some temporal recession   Asessement: 1. Previous gastric bypass affecting pregnancy in first trimester, antepartum   2. Hx of preeclampsia, prior pregnancy, currently pregnant, first trimester   3. Previous cesarean delivery, antepartum condition or complication   4 , depression on meds  Plan: 1 follow up with bariatric program re micronutrients - consider dietiian consult may need supplement  during pregnancy with Boost or similar  2 no glucose load - osmotic load can cause dumping - have pt check fbs and 2 h pp for several days in third trimester in place of screen 3 for abdominal pain not clearly from OB causes - have GSU see to r/o anastomotic leak rare but dangerous - pt warned  4 pt will ask Dr Roderic Ovens office if baby aspirin ok for her  bypass would avoid other NSAIDs 5 continue zoloft some risk of neonatal irritability 6 pt leaning toward repeat cesarean - may be helpful to obtain records from [facility] 7 consider baseline TFTs, urine p/c ratio ( pt had EKG and CXR done in last few years, given h/o pulmonary edema - low threshold for an echo )   F/u prn      Total time spent with the patient was 30 minutes with greater than 50% spent in counseling and coordination of care. We appreciate this interesting consult and will be happy to be involved in the ongoing care of Maria Fuller in anyway her obstetricians desire.  Jimmey RalphLivingston,  [place] Montminy  Maternal-Fetal Medicine [facility]

## 2015-10-23 ENCOUNTER — Ambulatory Visit
Admission: RE | Admit: 2015-10-23 | Discharge: 2015-10-23 | Disposition: A | Discharge status: Home / Self Care | Payer: BC Managed Care – PPO | Source: Ambulatory Visit | Attending: Obstetrics and Gynecology | Admitting: Obstetrics and Gynecology

## 2015-10-23 DIAGNOSIS — R002 Palpitations: Secondary | ICD-10-CM | POA: Insufficient documentation

## 2015-10-23 DIAGNOSIS — R001 Bradycardia, unspecified: Secondary | ICD-10-CM | POA: Insufficient documentation

## 2015-11-10 ENCOUNTER — Encounter: Payer: Self-pay | Admitting: Emergency Medicine

## 2015-11-10 ENCOUNTER — Emergency Department
Admission: EM | Admit: 2015-11-10 | Discharge: 2015-11-10 | Disposition: A | Discharge status: Home / Self Care | Payer: BC Managed Care – PPO | Attending: Emergency Medicine | Admitting: Emergency Medicine

## 2015-11-10 DIAGNOSIS — R300 Dysuria: Secondary | ICD-10-CM | POA: Insufficient documentation

## 2015-11-10 DIAGNOSIS — O26892 Other specified pregnancy related conditions, second trimester: Secondary | ICD-10-CM | POA: Insufficient documentation

## 2015-11-10 DIAGNOSIS — Z3A13 13 weeks gestation of pregnancy: Secondary | ICD-10-CM | POA: Insufficient documentation

## 2015-11-10 DIAGNOSIS — Z7982 Long term (current) use of aspirin: Secondary | ICD-10-CM | POA: Diagnosis not present

## 2015-11-10 DIAGNOSIS — R102 Pelvic and perineal pain: Secondary | ICD-10-CM | POA: Diagnosis not present

## 2015-11-10 LAB — URINALYSIS COMPLETE WITH MICROSCOPIC (ARMC ONLY)
BACTERIA UA: NONE SEEN
Bilirubin Urine: NEGATIVE
Glucose, UA: NEGATIVE mg/dL
Hgb urine dipstick: NEGATIVE
KETONES UR: NEGATIVE mg/dL
Leukocytes, UA: NEGATIVE
NITRITE: NEGATIVE
PH: 5 (ref 5.0–8.0)
Protein, ur: 30 mg/dL — AB
Specific Gravity, Urine: 1.03 (ref 1.005–1.030)

## 2015-11-10 LAB — BASIC METABOLIC PANEL
ANION GAP: 7 (ref 5–15)
BUN: 9 mg/dL (ref 6–20)
CO2: 19 mmol/L — AB (ref 22–32)
Calcium: 8.8 mg/dL — ABNORMAL LOW (ref 8.9–10.3)
Chloride: 110 mmol/L (ref 101–111)
Creatinine, Ser: 0.32 mg/dL — ABNORMAL LOW (ref 0.44–1.00)
GFR calc Af Amer: >60 mL/min (ref >60)
GLUCOSE: 83 mg/dL (ref 65–99)
POTASSIUM: 3.5 mmol/L (ref 3.5–5.1)
Sodium: 136 mmol/L (ref 135–145)

## 2015-11-10 LAB — CBC
HEMATOCRIT: 35 % (ref 35.0–47.0)
HEMOGLOBIN: 12.4 g/dL (ref 12.0–16.0)
MCH: 31.5 pg (ref 26.0–34.0)
MCHC: 35.3 g/dL (ref 32.0–36.0)
MCV: 89.1 fL (ref 80.0–100.0)
Platelets: 311 10*3/uL (ref 150–440)
RBC: 3.93 MIL/uL (ref 3.80–5.20)
RDW: 13.2 % (ref 11.5–14.5)
WBC: 8.2 10*3/uL (ref 3.6–11.0)

## 2015-11-10 LAB — HCG, QUANTITATIVE, PREGNANCY: hCG, Beta Chain, Quant, S: 37796 m[IU]/mL — ABNORMAL HIGH (ref <5)

## 2015-11-10 LAB — ABO/RH: ABO/RH(D): O POS

## 2015-11-10 MED ORDER — ONDANSETRON 8 MG PO TBDP
8.0000 mg | ORAL_TABLET | Freq: Once | ORAL | Status: AC
  Administered 2015-11-10: 8 mg via ORAL
  Filled 2015-11-10: qty 1

## 2015-11-10 MED ORDER — FOSFOMYCIN TROMETHAMINE 3 G PO PACK
3.0000 g | PACK | Freq: Once | ORAL | Status: DC
  Filled 2015-11-10: qty 3

## 2015-11-10 MED ORDER — ONDANSETRON 4 MG PO TBDP: 4.0000 mg | ORAL_TABLET | Freq: Three times a day (TID) | ORAL | Status: DC | PRN

## 2015-11-10 NOTE — ED Provider Notes (Signed)
[facility] Emergency Department Provider Note  ____________________________________________  Time seen: 4:40 PM  I have reviewed the triage vital signs and the nursing notes.   HISTORY  Chief Complaint Dysuria    HPI Maria Fuller is a 30 y.o. female who complains of dysuria for a month. This is been evaluated extensively by her obstetrician as she is [redacted] weeks pregnant. They've attempted to culture multiple times without any success in isolating a pathogen. She has previously had cystitis during pregnancy and is on Macrobid chronically. She feels like she is having more pain associated with nausea for the last several days. Decreased oral intake because of this. No dizziness. No chest pain shortness of breath. No vaginal bleeding or leakage of fluids or contractions. She follows with [facility] Dr. Elesa Massed.     History reviewed. No pertinent past medical history.   Patient Active Problem List   Diagnosis Date Noted  . 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 Hx of preeclampsia, prior pregnancy, currently pregnant 10/18/2015  . Previous cesarean delivery, antepartum condition or complication 10/18/2015  . Depression affecting pregnancy in first trimester, antepartum 10/18/2015  . Supervision of high risk pregnancy in first trimester 10/06/2015  . Right flank pain 10/06/2015  . Pyelonephritis affecting pregnancy in first trimester 10/06/2015  . Pyelonephritis complicating pregnancy 10/06/2015     Past Surgical History  Procedure Laterality Date  . Cholecystectomy    . Cesarean section    . Tonsillectomy    . Gastric bypass  04/2015     Current Outpatient Rx  Name  Route  Sig  Dispense  Refill  . aspirin 81 MG tablet   Oral   Take 81 mg by mouth daily.         . nitrofurantoin, macrocrystal-monohydrate, (MACROBID) 100 MG capsule   Oral   Take 100 mg by mouth 2 (two) times daily.          . ondansetron (ZOFRAN ODT) 4 MG disintegrating tablet   Oral   Take 1 tablet (4 mg total) by mouth every 8 (eight) hours as needed for nausea or vomiting.   20 tablet   0   . Prenatal Vit-Fe Fumarate-FA (PRENATAL MULTIVITAMIN) TABS tablet   Oral   Take 1 tablet by mouth daily at 12 noon.         . progesterone (PROMETRIUM) 200 MG capsule   Oral   Take 1 capsule (200 mg total) by mouth 2 (two) times daily.   22 capsule   0   . sertraline (ZOLOFT) 100 MG tablet   Oral   Take 100 mg by mouth every morning.             Allergies Cefdinir and Penicillins   No family history on file.  Social History Social History  Substance Use Topics  . Smoking status: Never Smoker   . Smokeless tobacco: Never Used  . Alcohol Use: No    Review of Systems  Constitutional:   No fever or chills.  ENT:   No sore throat. No rhinorrhea. Cardiovascular:   No chest pain. Respiratory:   No dyspnea or cough. Gastrointestinal:   Positive suprapubic pain.  Genitourinary:   Positive dysuria. Musculoskeletal:   Negative for focal pain or swelling Neurological:   Negative for headaches 10-point ROS otherwise negative.  ____________________________________________   PHYSICAL EXAM:  VITAL SIGNS: ED Triage Vitals  Enc Vitals Group     BP 11/10/15 1301 127/53  mmHg     Pulse Rate 11/10/15 1301 65     Resp 11/10/15 1301 18     Temp 11/10/15 1301 98 F (36.7 C)     Temp Source 11/10/15 1301 Oral     SpO2 11/10/15 1301 98 %     Weight 11/10/15 1301 265 lb (120.203 kg)     Height 11/10/15 1301 6\' 1"  (1.854 m)     Head Cir --      Peak Flow --      Pain Score 11/10/15 1302 4     Pain Loc --      Pain Edu? --      Excl. in GC? --     Vital signs reviewed, nursing assessments reviewed.   Constitutional:   Alert and oriented. Well appearing and in no distress. Eyes:   No scleral icterus. No conjunctival pallor. PERRL. EOMI.  No nystagmus. ENT   Head:   Normocephalic and  atraumatic.   Nose:   No congestion/rhinnorhea. No septal hematoma   Mouth/Throat:   MMM, no pharyngeal erythema. No peritonsillar mass.    Neck:   No stridor. No SubQ emphysema. No meningismus. Hematological/Lymphatic/Immunilogical:   No cervical lymphadenopathy. Cardiovascular:   RRR. Symmetric bilateral radial and DP pulses.  No murmurs.  Respiratory:   Normal respiratory effort without tachypnea nor retractions. Breath sounds are clear and equal bilaterally. No wheezes/rales/rhonchi. Gastrointestinal:   Soft With suprapubic tenderness. Non distended. There is no CVA tenderness.  No rebound, rigidity, or guarding. Genitourinary:   deferred Musculoskeletal:   Nontender with normal range of motion in all extremities. No joint effusions.  No lower extremity tenderness.  No edema. Neurologic:   Normal speech and language.  CN 2-10 normal. Motor grossly intact. No gross focal neurologic deficits are appreciated.  Skin:    Skin is warm, dry and intact. No rash noted.  No petechiae, purpura, or bullae.  ____________________________________________    LABS (pertinent positives/negatives) (all labs ordered are listed, but only abnormal results are displayed) Labs Reviewed  URINALYSIS COMPLETEWITH MICROSCOPIC ([facility] ONLY) - Abnormal; Notable for the following:    Color, Urine AMBER (*)    APPearance CLOUDY (*)    Protein, ur 30 (*)    Squamous Epithelial / LPF 6-30 (*)    All other components within normal limits  HCG, QUANTITATIVE, PREGNANCY - Abnormal; Notable for the following:    hCG, Beta Chain, Quant, S [id number] (*)    All other components within normal limits  BASIC METABOLIC PANEL - Abnormal; Notable for the following:    CO2 19 (*)    Creatinine, Ser 0.32 (*)    Calcium 8.8 (*)    All other components within normal limits  URINE CULTURE  CBC  ABO/RH   ____________________________________________   EKG    ____________________________________________     RADIOLOGY    ____________________________________________   PROCEDURES   ____________________________________________   INITIAL IMPRESSION / ASSESSMENT AND PLAN / ED COURSE  Pertinent labs & imaging results that were available during my care of the patient were reviewed by me and considered in my medical decision making (see chart for details).  Patient well appearing no acute distress. Labs are entirely unremarkable. No evidence of urinary tract infection at this time even though her symptoms and exam are consistent. No evidence of pyelonephritis. Patient is allergic to penicillins and cephalosporins. She potentially has a Macrobid resistant UTI that is not showing significant inflammatory changes on urinalysis due to her chronic Macrobid use.  She was ordered fosfomycin in the ED but she refused it due to concern over GI side effects. There are no other safe antibiotics that would be effective, so I'll defer further management to obstetrics. She is very well-appearing, only other issue is nausea for which she was given Zofran by mouth she is tolerating oral intake and suitable for discharge home.     ____________________________________________   FINAL CLINICAL IMPRESSION(S) / ED DIAGNOSES  Final diagnoses:  Dysuria during pregnancy, second trimester       Portions of this note were generated with dragon dictation software. Dictation errors may occur despite best attempts at proofreading.   Sharman CheekPhillip Nyeisha Goodall, MD 11/10/15 [phone] [id number]

## 2015-11-10 NOTE — Discharge Instructions (Signed)
Abdominal Pain During Pregnancy °Abdominal pain is common in pregnancy. Most of the time, it does not cause harm. There are many causes of abdominal pain. Some causes are more serious than others. Some of the causes of abdominal pain in pregnancy are easily diagnosed. Occasionally, the diagnosis takes time to understand. Other times, the cause is not determined. Abdominal pain can be a sign that something is very wrong with the pregnancy, or the pain may have nothing to do with the pregnancy at all. For this reason, always tell your health care provider if you have any abdominal discomfort. °HOME CARE INSTRUCTIONS  °Monitor your abdominal pain for any changes. The following actions may help to alleviate any discomfort you are experiencing: °· Do not have sexual intercourse or put anything in your vagina until your symptoms go away completely. °· Get plenty of rest until your pain improves. °· Drink clear fluids if you feel nauseous. Avoid solid food as long as you are uncomfortable or nauseous. °· Only take over-the-counter or prescription medicine as directed by your health care provider. °· Keep all follow-up appointments with your health care provider. °SEEK IMMEDIATE MEDICAL CARE IF: °· You are bleeding, leaking fluid, or passing tissue from the vagina. °· You have increasing pain or cramping. °· You have persistent vomiting. °· You have painful or bloody urination. °· You have a fever. °· You notice a decrease in your baby's movements. °· You have extreme weakness or feel faint. °· You have shortness of breath, with or without abdominal pain. °· You develop a severe headache with abdominal pain. °· You have abnormal vaginal discharge with abdominal pain. °· You have persistent diarrhea. °· You have abdominal pain that continues even after rest, or gets worse. °MAKE SURE YOU:  °· Understand these instructions. °· Will watch your condition. °· Will get help right away if you are not doing well or get worse. °    °This information is not intended to replace advice given to you by your health care provider. Make sure you discuss any questions you have with your health care provider. °  °Document Released: 04/21/2005 Document Revised: 02/09/2013 Document Reviewed: 11/18/2012 °Elsevier Interactive Patient Education ©2016 [employer] ° ° °Dysuria °Dysuria is pain or discomfort while urinating. The pain or discomfort may be felt in the tube that carries urine out of the bladder (urethra) or in the surrounding tissue of the genitals. The pain may also be felt in the groin area, lower abdomen, and lower back. You may have to urinate frequently or have the sudden feeling that you have to urinate (urgency). Dysuria can affect both men and women, but is more common in women. °Dysuria can be caused by many different things, including: °· Urinary tract infection in women. °· Infection of the kidney or bladder. °· Kidney stones or bladder stones. °· Certain sexually transmitted infections (STIs), such as chlamydia. °· Dehydration. °· Inflammation of the vagina. °· Use of certain medicines. °· Use of certain soaps or scented products that cause irritation. °HOME CARE INSTRUCTIONS °Watch your dysuria for any changes. The following actions may help to reduce any discomfort you are feeling: °· Drink enough fluid to keep your urine clear or pale yellow. °· Empty your bladder often. Avoid holding urine for long periods of time. °· After a bowel movement or urination, women should cleanse from front to back, using each tissue only once. °· Empty your bladder after sexual intercourse. °· Take medicines only as directed by your health   care provider. °· If you were prescribed an antibiotic medicine, finish it all even if you start to feel better. °· Avoid caffeine, tea, and alcohol. They can irritate the bladder and make dysuria worse. In men, alcohol may irritate the prostate. °· Keep all follow-up visits as directed by your health care  provider. This is important. °· If you had any tests done to find the cause of dysuria, it is your responsibility to obtain your test results. Ask the lab or department performing the test when and how you will get your results. Talk with your health care provider if you have any questions about your results. °SEEK MEDICAL CARE IF: °· You develop pain in your back or sides. °· You have a fever. °· You have nausea or vomiting. °· You have blood in your urine. °· You are not urinating as often as you usually do. °SEEK IMMEDIATE MEDICAL CARE IF: °· You pain is severe and not relieved with medicines. °· You are unable to hold down any fluids. °· You or someone else notices a change in your mental function. °· You have a rapid heartbeat at rest. °· You have shaking or chills. °· You feel extremely weak. °  °This information is not intended to replace advice given to you by your health care provider. Make sure you discuss any questions you have with your health care provider. °  °Document Released: 01/18/2004 Document Revised: 05/12/2014 Document Reviewed: 12/15/2013 °Elsevier Interactive Patient Education ©2016 [employer] Inc[employer] ° °

## 2015-11-10 NOTE — ED Notes (Addendum)
Patient to ER for c/o UTI symptoms that began last month. States she is [redacted] weeks pregnant, has had trouble with UTI's for some time now and has been admitted for kidney infection. Patient reports lower back pain, and noticed streak of blood in urine. +Dysuria. Denies any vaginal bleeding.

## 2015-11-12 LAB — URINE CULTURE

## 2015-12-11 ENCOUNTER — Encounter: Payer: Self-pay | Admitting: Emergency Medicine

## 2015-12-11 ENCOUNTER — Emergency Department: Payer: BC Managed Care – PPO

## 2015-12-11 ENCOUNTER — Observation Stay
Admission: EM | Admit: 2015-12-11 | Discharge: 2015-12-12 | Disposition: A | Discharge status: Home / Self Care | Payer: BC Managed Care – PPO | Attending: Obstetrics and Gynecology | Admitting: Obstetrics and Gynecology

## 2015-12-11 DIAGNOSIS — Z9889 Other specified postprocedural states: Secondary | ICD-10-CM | POA: Diagnosis not present

## 2015-12-11 DIAGNOSIS — Z3A17 17 weeks gestation of pregnancy: Secondary | ICD-10-CM | POA: Diagnosis not present

## 2015-12-11 DIAGNOSIS — Z9049 Acquired absence of other specified parts of digestive tract: Secondary | ICD-10-CM | POA: Diagnosis not present

## 2015-12-11 DIAGNOSIS — O09292 Supervision of pregnancy with other poor reproductive or obstetric history, second trimester: Secondary | ICD-10-CM | POA: Diagnosis present

## 2015-12-11 DIAGNOSIS — R10A2 Flank pain, left side: Secondary | ICD-10-CM | POA: Diagnosis present

## 2015-12-11 DIAGNOSIS — O99212 Obesity complicating pregnancy, second trimester: Secondary | ICD-10-CM | POA: Diagnosis not present

## 2015-12-11 DIAGNOSIS — O26832 Pregnancy related renal disease, second trimester: Principal | ICD-10-CM | POA: Insufficient documentation

## 2015-12-11 DIAGNOSIS — R109 Unspecified abdominal pain: Secondary | ICD-10-CM | POA: Diagnosis present

## 2015-12-11 DIAGNOSIS — F329 Major depressive disorder, single episode, unspecified: Secondary | ICD-10-CM

## 2015-12-11 DIAGNOSIS — O9989 Other specified diseases and conditions complicating pregnancy, childbirth and the puerperium: Secondary | ICD-10-CM | POA: Insufficient documentation

## 2015-12-11 DIAGNOSIS — Z6834 Body mass index (BMI) 34.0-34.9, adult: Secondary | ICD-10-CM | POA: Insufficient documentation

## 2015-12-11 DIAGNOSIS — O99342 Other mental disorders complicating pregnancy, second trimester: Secondary | ICD-10-CM | POA: Diagnosis not present

## 2015-12-11 DIAGNOSIS — O99841 Bariatric surgery status complicating pregnancy, first trimester: Secondary | ICD-10-CM

## 2015-12-11 DIAGNOSIS — Z833 Family history of diabetes mellitus: Secondary | ICD-10-CM | POA: Insufficient documentation

## 2015-12-11 DIAGNOSIS — Z803 Family history of malignant neoplasm of breast: Secondary | ICD-10-CM | POA: Diagnosis not present

## 2015-12-11 DIAGNOSIS — Z9884 Bariatric surgery status: Secondary | ICD-10-CM | POA: Insufficient documentation

## 2015-12-11 DIAGNOSIS — N132 Hydronephrosis with renal and ureteral calculous obstruction: Secondary | ICD-10-CM

## 2015-12-11 DIAGNOSIS — E282 Polycystic ovarian syndrome: Secondary | ICD-10-CM | POA: Diagnosis not present

## 2015-12-11 DIAGNOSIS — Z880 Allergy status to penicillin: Secondary | ICD-10-CM | POA: Diagnosis not present

## 2015-12-11 DIAGNOSIS — Z842 Family history of other diseases of the genitourinary system: Secondary | ICD-10-CM | POA: Insufficient documentation

## 2015-12-11 DIAGNOSIS — N200 Calculus of kidney: Secondary | ICD-10-CM | POA: Diagnosis present

## 2015-12-11 DIAGNOSIS — Z87440 Personal history of urinary (tract) infections: Secondary | ICD-10-CM | POA: Diagnosis not present

## 2015-12-11 DIAGNOSIS — Z79899 Other long term (current) drug therapy: Secondary | ICD-10-CM | POA: Diagnosis not present

## 2015-12-11 DIAGNOSIS — N1330 Unspecified hydronephrosis: Secondary | ICD-10-CM | POA: Insufficient documentation

## 2015-12-11 DIAGNOSIS — O99341 Other mental disorders complicating pregnancy, first trimester: Secondary | ICD-10-CM

## 2015-12-11 HISTORY — DX: Polycystic ovarian syndrome: E28.2

## 2015-12-11 HISTORY — DX: Major depressive disorder, single episode, unspecified: F32.9

## 2015-12-11 HISTORY — DX: Depression, unspecified: F32.A

## 2015-12-11 LAB — COMPREHENSIVE METABOLIC PANEL
ALBUMIN: 3.6 g/dL (ref 3.5–5.0)
ALT: 25 U/L (ref 14–54)
ANION GAP: 6 (ref 5–15)
AST: 19 U/L (ref 15–41)
Alkaline Phosphatase: 52 U/L (ref 38–126)
BILIRUBIN TOTAL: 0.8 mg/dL (ref 0.3–1.2)
BUN: 8 mg/dL (ref 6–20)
CHLORIDE: 110 mmol/L (ref 101–111)
CO2: 22 mmol/L (ref 22–32)
Calcium: 8.9 mg/dL (ref 8.9–10.3)
Creatinine, Ser: 0.4 mg/dL — ABNORMAL LOW (ref 0.44–1.00)
GFR calc Af Amer: >60 mL/min (ref >60)
GFR calc non Af Amer: >60 mL/min (ref >60)
GLUCOSE: 81 mg/dL (ref 65–99)
POTASSIUM: 3.3 mmol/L — AB (ref 3.5–5.1)
SODIUM: 138 mmol/L (ref 135–145)
Total Protein: 6.4 g/dL — ABNORMAL LOW (ref 6.5–8.1)

## 2015-12-11 LAB — URINALYSIS COMPLETE WITH MICROSCOPIC (ARMC ONLY)
BACTERIA UA: NONE SEEN
Bilirubin Urine: NEGATIVE
GLUCOSE, UA: NEGATIVE mg/dL
Ketones, ur: NEGATIVE mg/dL
LEUKOCYTES UA: NEGATIVE
Nitrite: NEGATIVE
PH: 5 (ref 5.0–8.0)
Protein, ur: 30 mg/dL — AB
SPECIFIC GRAVITY, URINE: 1.023 (ref 1.005–1.030)

## 2015-12-11 LAB — CBC WITH DIFFERENTIAL/PLATELET
BASOS ABS: 0 10*3/uL (ref 0–0.1)
BASOS PCT: 0 %
EOS ABS: 0.1 10*3/uL (ref 0–0.7)
Eosinophils Relative: 2 %
HEMATOCRIT: 35.2 % (ref 35.0–47.0)
Hemoglobin: 12.2 g/dL (ref 12.0–16.0)
Lymphocytes Relative: 34 %
Lymphs Abs: 3 10*3/uL (ref 1.0–3.6)
MCH: 31.3 pg (ref 26.0–34.0)
MCHC: 34.7 g/dL (ref 32.0–36.0)
MCV: 90.2 fL (ref 80.0–100.0)
MONOS PCT: 6 %
Monocytes Absolute: 0.5 10*3/uL (ref 0.2–0.9)
NEUTROS ABS: 5.2 10*3/uL (ref 1.4–6.5)
NEUTROS PCT: 58 %
PLATELETS: 314 10*3/uL (ref 150–440)
RBC: 3.91 MIL/uL (ref 3.80–5.20)
RDW: 13.4 % (ref 11.5–14.5)
WBC: 8.9 10*3/uL (ref 3.6–11.0)

## 2015-12-11 LAB — HCG, QUANTITATIVE, PREGNANCY: HCG, BETA CHAIN, QUANT, S: 14677 m[IU]/mL — AB (ref <5)

## 2015-12-11 MED ORDER — MORPHINE SULFATE (PF) 4 MG/ML IV SOLN
4.0000 mg | Freq: Once | INTRAVENOUS | Status: AC
  Administered 2015-12-11: 4 mg via INTRAVENOUS
  Filled 2015-12-11: qty 1

## 2015-12-11 MED ORDER — NITROFURANTOIN MONOHYD MACRO 100 MG PO CAPS
100.0000 mg | ORAL_CAPSULE | Freq: Every day | ORAL | Status: DC
  Administered 2015-12-11: 100 mg via ORAL
  Filled 2015-12-11 (×2): qty 1

## 2015-12-11 MED ORDER — ONDANSETRON HCL 4 MG/2ML IJ SOLN
4.0000 mg | Freq: Once | INTRAMUSCULAR | Status: AC
  Administered 2015-12-11: 4 mg via INTRAVENOUS
  Filled 2015-12-11: qty 2

## 2015-12-11 MED ORDER — ALUM & MAG HYDROXIDE-SIMETH 200-200-20 MG/5ML PO SUSP
30.0000 mL | ORAL | Status: DC | PRN
  Administered 2015-12-11: 30 mL via ORAL
  Filled 2015-12-11: qty 30

## 2015-12-11 MED ORDER — ASPIRIN EC 81 MG PO TBEC
81.0000 mg | DELAYED_RELEASE_TABLET | Freq: Every day | ORAL | Status: DC
  Administered 2015-12-11: 81 mg via ORAL
  Filled 2015-12-11 (×2): qty 1

## 2015-12-11 MED ORDER — SODIUM CHLORIDE 0.9 % IV BOLUS (SEPSIS)
1000.0000 mL | Freq: Once | INTRAVENOUS | Status: AC
  Administered 2015-12-11: 1000 mL via INTRAVENOUS

## 2015-12-11 MED ORDER — HYDROCODONE-ACETAMINOPHEN 5-325 MG PO TABS
1.0000 | ORAL_TABLET | ORAL | Status: DC | PRN
  Administered 2015-12-11 – 2015-12-12 (×7): 2 via ORAL
  Filled 2015-12-11 (×7): qty 2

## 2015-12-11 MED ORDER — LACTATED RINGERS IV SOLN
125.0000 mL/h | INTRAVENOUS | Status: DC
  Administered 2015-12-11 – 2015-12-12 (×4): 125 mL/h via INTRAVENOUS

## 2015-12-11 MED ORDER — PRENATAL MULTIVITAMIN CH
1.0000 | ORAL_TABLET | Freq: Every day | ORAL | Status: DC
  Filled 2015-12-11: qty 1

## 2015-12-11 MED ORDER — SERTRALINE HCL 100 MG PO TABS
200.0000 mg | ORAL_TABLET | Freq: Every day | ORAL | Status: DC
Start: 2015-12-11 — End: 2015-12-12
  Administered 2015-12-11: 200 mg via ORAL
  Filled 2015-12-11 (×2): qty 2

## 2015-12-11 NOTE — H&P (Signed)
OBSTETRICS/GYNECOLOGY ADMISSION HISTORY AND PHYSICAL  OB/GYN Admission  Attending Provider: Conard Novak, MD   Parneet Glantz Ra [id number] 12/11/2015 10:33 AM    Chief Complaint:   Maria Fuller is a 30 y.o. G2P0101 at 106w3d gestational age by a [redacted]w[redacted]d u/s on 09/20/15 who presents with acute onset left flank pain.   History of Present Ilness:   She has a complicated history of urinary tract infection for which she has been treated with multiple antibiotic regimens.  She has been taking nitrofurantoin for prophylaxis.  She had acute-onset left back and left flank pain that woke her from sleep this morning at 3am.  The pain starts in her mid-lower back and radiates around her side and to her left lower quadrant. The pain is described as sharp and stabbing and is constant.  The pain is rated 8/10.  Nothing makes it better. Pushing over the areas of pain makes it worse.  She had diarrhea two days ago, but no associated symptoms today. She specifically denies emesis, vaginal bleeding or uterine cramping.  She is currently tolerating PO well.  Her last pregnancy was complicated by severe preeclampsia necessitating delivery by cesarean section at [redacted] weeks gestation 9 years ago.  Her current pregnancy is complicated by her obesity and history of diabetes and hypertension (both of which may be resolved as a result of losing 130+ pounds since her surgery).  She had hematuria last week, which resolved.   Past Medical History:  Diagnosis Date  . Depression   . Diabetes mellitus without complication (HCC)   . PCOS (polycystic ovarian syndrome)    Past Surgical History:  Procedure Laterality Date  . CESAREAN SECTION    . CHOLECYSTECTOMY    . GASTRIC BYPASS  04/2015  . TONSILLECTOMY     Allergies  Allergen Reactions  . Cefdinir Rash  . Penicillins Rash    Has patient had a PCN reaction causing immediate rash, facial/tongue/throat swelling, SOB or lightheadedness with hypotension: Yes Has  patient had a PCN reaction causing severe rash involving mucus membranes or skin necrosis: No Has patient had a PCN reaction that required hospitalization No Has patient had a PCN reaction occurring within the last 10 years: No If all of the above answers are "NO", then may proceed with Cephalosporin use.   Prior to Admission medications   Medication Sig Start Date End Date Taking? Authorizing Provider  sertraline (ZOLOFT) 100 MG tablet Take 100 mg by mouth every morning.    Yes Historical Provider, MD    Obstetric History: She is a G5P0101 female with an obstetrical history as outlined in her HPI   Gynecologic History:  Denies a history of abnormal pap smears and STDs  Social History:  She  reports that she has never smoked. She has never used smokeless tobacco. She reports that she does not drink alcohol or use drugs.  Family History  Problem Relation Age of Onset  . Polycystic ovary syndrome Mother   . Diabetes Father   . Endometriosis Sister   . Polycystic ovary syndrome Sister   . Breast cancer Paternal Grandmother   . Diabetes Paternal Grandmother   . Diabetes Paternal Grandfather      Review of Systems:  Negative x 10 systems reviewed except as noted in the HPI.    Objective    BP (!) 106/58 (BP Location: Right Arm)   Pulse 62   Temp 97.5 F (36.4 C) (Oral)   Resp 17   Ht  (  1.854 m)   Wt 117 kg (258 lb)   LMP 08/11/2015 (Exact Date)   SpO2 100%   BMI 34.04 kg/m  Physical Exam  General:  She is a well appearing female in no mild distress. Hirsute.  HEENT:  Normocephalic, atraumatic.   Neck:  supple, no lymphadenopathy Cardiac:  RRR Pulmonary:  Clear to auscultation bilaterally. No wheezes, rales, rhonchi.   Abdomen:  Obese, soft, non-tender, non-distended, left CVA and left flank tenderness to moderate palpation. Pelvic:  deferred Extremities:  Non-tender, symmetric no edema bilaterally.   Neurologic:  Alert & oriented x 3.  Appropriate, conversant.     Laboratory Results:   Lab Results  Component Value Date   WBC 8.9 12/11/2015   RBC 3.91 12/11/2015   HGB 12.2 12/11/2015   HCT 35.2 12/11/2015   PLT 314 12/11/2015   NA 138 12/11/2015   K 3.3 (L) 12/11/2015   CREATININE 0.40 (L) 12/11/2015   Lab Results  Component Value Date   APPEARANCEUR CLOUDY (A) 12/11/2015   GLUCOSEU NEGATIVE 12/11/2015   BILIRUBINUR NEGATIVE 12/11/2015   KETONESUR NEGATIVE 12/11/2015   LABSPEC 1.023 12/11/2015   HGBUR 3+ (A) 12/11/2015   PHURINE 5.0 12/11/2015   NITRITE NEGATIVE 12/11/2015   LEUKOCYTESUR NEGATIVE 12/11/2015   RBCU TOO NUMEROUS TO COUNT 12/11/2015   WBCU 6-30 12/11/2015   BACTERIA NONE SEEN 12/11/2015   EPIU 6-30 (A) 12/11/2015   MUCOUSUACOMP PRESENT 12/11/2015   CAOXCRYSTALS PRESENT 12/11/2015   Imaging Results:  [place] Renal  Result Date: 12/11/2015 CLINICAL DATA:  LEFT flank pain and low back pain for 1 month mid microscopic hematuria, [redacted] weeks pregnant EXAM: RENAL / URINARY TRACT ULTRASOUND COMPLETE COMPARISON:  11/02/2015 FINDINGS: Right Kidney: Length: 15.9 cm. Normal cortical thickness and echogenicity. No mass, hydronephrosis or shadowing calcification. Left Kidney: Length: 14.7 cm. Normal cortical thickness and echogenicity. Mild LEFT hydronephrosis new since previous exam. No mass or shadowing calcification. Bladder: Contains minimal urine, is suboptimally visualized. Unable to assess for ureteral jets. IMPRESSION: Mild LEFT hydronephrosis new since prior study. Electronically Signed   By: Ulyses SouthwardMark  Boles M.D.   On: 12/11/2015 08:42     Assessment & Plan   Maria Fuller is a 30 y.o. 332P0101 female at 1349w3d gestational age with a history of acute onset left flank pain and left back pain, new left hydronephrosis (mild).  Admit for pain control and urology consult for treatment options. Mostly concerning for left ureterolithiasis.  No evidence of obstruction with infection.  However, will defer to urology on assessment and  treatment  1.  Admit for at least observation 2.  Pain control for now with PO medication 3.  Urology consult. Placed by ER physician. Urologist on call aware per ER physician.  Conard NovakJackson, Stephen D, MD 12/11/2015 10:33 AM

## 2015-12-11 NOTE — ED Triage Notes (Signed)
Pt reports awoke at 3am with cramping to lower abd and back; no vag bleeding; EDC 1/11, approx 17.[redacted]wks pregnant; [facility]; G2 P1

## 2015-12-11 NOTE — Consult Note (Signed)
Urology Consult  Referring physician: S Jackson Reason for referral: Hydronephrosis  Chief Complaint: Hydronephrosis  History of Present Illness: 30 year old woman [redacted] weeks pregnant; left flank pain; taking prophylactic macrodantin for UTIs; her u/sound shows mild left hydro but jets could not be assessed as noted; no hydro usound June 30th; the ER did not think she had a UTI and c/s sent; serum Cr normal;   In June she was felt to have pyelo but all c/s normal; there was NO STONE June 3 on u/sound; she has been on macrobid bid since   SInce weight loss surgery she has had UTI but she said many times she had no symptoms  Her pain was acute; not convinced she had increased frequency; she had no nausea or burning; she said urine today bit foul smelling  Pain improve today with meds  Modifying factors: There are no other modifying factors  Associated signs and symptoms: There are no other associated signs and symptoms Aggravating and relieving factors: There are no other aggravating or relieving factors Severity: Moderate Duration: Persistent but improved  Past Medical History:  Diagnosis Date  . Depression   . Diabetes mellitus without complication (HCC)   . PCOS (polycystic ovarian syndrome)    Past Surgical History:  Procedure Laterality Date  . CESAREAN SECTION    . CHOLECYSTECTOMY    . GASTRIC BYPASS  04/2015  . TONSILLECTOMY      Medications: I have reviewed the patient's current medications. Allergies:  Allergies  Allergen Reactions  . Cefdinir Rash  . Penicillins Rash    Has patient had a PCN reaction causing immediate rash, facial/tongue/throat swelling, SOB or lightheadedness with hypotension: Yes Has patient had a PCN reaction causing severe rash involving mucus membranes or skin necrosis: No Has patient had a PCN reaction that required hospitalization No Has patient had a PCN reaction occurring within the last 10 years: No If all of the above answers are "NO",  then may proceed with Cephalosporin use.    Family History  Problem Relation Age of Onset  . Polycystic ovary syndrome Mother   . Diabetes Father   . Endometriosis Sister   . Polycystic ovary syndrome Sister   . Breast cancer Paternal Grandmother   . Diabetes Paternal Grandmother   . Diabetes Paternal Grandfather    Social History:  reports that she has never smoked. She has never used smokeless tobacco. She reports that she does not drink alcohol or use drugs.  ROS: All systems are reviewed and negative except as noted. Rest negative  Physical Exam:  Vital signs in last 24 hours: Temp:  [97.5 F (36.4 C)-98.3 F (36.8 C)] 98.3 F (36.8 C) (08/08 1657) Pulse Rate:  [53-70] 56 (08/08 1657) Resp:  [17-20] 18 (08/08 1657) BP: (105-133)/(57-74) 108/58 (08/08 1657) SpO2:  [100 %] 100 % (08/08 1126) Weight:  [117 kg (258 lb)] 117 kg (258 lb) (08/08 0647)  Cardiovascular: Skin warm; not flushed Respiratory: Breaths quiet; no shortness of breath Abdomen: No masses Neurological: Normal sensation to touch Musculoskeletal: Normal motor function arms and legs Lymphatics: No inguinal adenopathy Skin: No rashes Genitourinary:non toxic and minimal CVA tender  Laboratory Data:  Results for orders placed or performed during the hospital encounter of 12/11/15 (from the past 72 hour(s))  Urinalysis complete, with microscopic ([facility] only)     Status: Abnormal   Collection Time: 12/11/15  7:12 AM  Result Value Ref Range   Color, Urine AMBER (A) YELLOW   APPearance CLOUDY (  A) CLEAR   Glucose, UA NEGATIVE NEGATIVE mg/dL   Bilirubin Urine NEGATIVE NEGATIVE   Ketones, ur NEGATIVE NEGATIVE mg/dL   Specific Gravity, Urine 1.023 1.005 - 1.030   Hgb urine dipstick 3+ (A) NEGATIVE   pH 5.0 5.0 - 8.0   Protein, ur 30 (A) NEGATIVE mg/dL   Nitrite NEGATIVE NEGATIVE   Leukocytes, UA NEGATIVE NEGATIVE   RBC / HPF TOO NUMEROUS TO COUNT 0 - 5 RBC/hpf   WBC, UA 6-30 0 - 5 WBC/hpf   Bacteria, UA  NONE SEEN NONE SEEN   Squamous Epithelial / LPF 6-30 (A) NONE SEEN   Mucous PRESENT    Ca Oxalate Crys, UA PRESENT   CBC with Differential/Platelet     Status: None   Collection Time: 12/11/15  7:27 AM  Result Value Ref Range   WBC 8.9 3.6 - 11.0 K/uL   RBC 3.91 3.80 - 5.20 MIL/uL   Hemoglobin 12.2 12.0 - 16.0 g/dL   HCT 35.2 35.0 - 47.0 %   MCV 90.2 80.0 - 100.0 fL   MCH 31.3 26.0 - 34.0 pg   MCHC 34.7 32.0 - 36.0 g/dL   RDW 13.4 11.5 - 14.5 %   Platelets 314 150 - 440 K/uL   Neutrophils Relative % 58 %   Neutro Abs 5.2 1.4 - 6.5 K/uL   Lymphocytes Relative 34 %   Lymphs Abs 3.0 1.0 - 3.6 K/uL   Monocytes Relative 6 %   Monocytes Absolute 0.5 0.2 - 0.9 K/uL   Eosinophils Relative 2 %   Eosinophils Absolute 0.1 0 - 0.7 K/uL   Basophils Relative 0 %   Basophils Absolute 0.0 0 - 0.1 K/uL  Comprehensive metabolic panel     Status: Abnormal   Collection Time: 12/11/15  7:27 AM  Result Value Ref Range   Sodium 138 135 - 145 mmol/L   Potassium 3.3 (L) 3.5 - 5.1 mmol/L   Chloride 110 101 - 111 mmol/L   CO2 22 22 - 32 mmol/L   Glucose, Bld 81 65 - 99 mg/dL   BUN 8 6 - 20 mg/dL   Creatinine, Ser 0.40 (L) 0.44 - 1.00 mg/dL   Calcium 8.9 8.9 - 10.3 mg/dL   Total Protein 6.4 (L) 6.5 - 8.1 g/dL   Albumin 3.6 3.5 - 5.0 g/dL   AST 19 15 - 41 U/L   ALT 25 14 - 54 U/L   Alkaline Phosphatase 52 38 - 126 U/L   Total Bilirubin 0.8 0.3 - 1.2 mg/dL   GFR calc non Af Amer >60 >60 mL/min   GFR calc Af Amer >60 >60 mL/min    Comment: (NOTE) The eGFR has been calculated using the CKD EPI equation. This calculation has not been validated in all clinical situations. eGFR's persistently <60 mL/min signify possible Chronic Kidney Disease.    Anion gap 6 5 - 15  hCG, quantitative, pregnancy     Status: Abnormal   Collection Time: 12/11/15  7:28 AM  Result Value Ref Range   hCG, Beta Chain, Quant, S 14,677 (H) <5 mIU/mL    Comment:          GEST. AGE      CONC.  (mIU/mL)   <=1 WEEK        5  - 50     2 WEEKS       50 - 500     3 WEEKS       100 - 10,000  4 WEEKS     1,000 - 30,000     5 WEEKS     3,500 - 115,000   6-8 WEEKS     12,000 - 270,000    12 WEEKS     15,000 - 220,000        FEMALE AND NON-PREGNANT FEMALE:     LESS THAN 5 mIU/mL    No results found for this or any previous visit (from the past 240 hour(s)). Creatinine:  Recent Labs  12/11/15 0727  CREATININE 0.40*    Xrays: See report/chart See above  Impression/Assessment:  Symptomatic left hydro with cultures pending; she does not look like she has pyelo; no stone was present in June  Plan:  Treat symptomatically; no scans now; will follow; await cultures; I will leave it up to primary team on whether or not to treat prior to c/s return (pregnant and allergies noted)  , A 12/11/2015, 5:44 PM     

## 2015-12-11 NOTE — ED Notes (Signed)
Patient transported to Ultrasound 

## 2015-12-11 NOTE — ED Notes (Signed)
Pt back from US

## 2015-12-11 NOTE — ED Provider Notes (Signed)
[facility] Emergency Department Provider Note  ____________________________________________  Time seen: Approximately 7:27 AM  I have reviewed the triage vital signs and the nursing notes.   HISTORY  Chief Complaint Abdominal Pain   HPI Maria Fuller is a 30 y.o. female a history of remote gastric bypass surgery and recurrent UTIs currently on prophylactic macrobid who presents for evaluation of abdominal pain. Patient reports that she woke up at 3 AM with severe left flank pain radiating to her left groin that she describes as cramping/sharp, constant, associated with nausea. Patient has been placed on Macrobid by her OB/GYN to prevent urinary tract infections. She denies any dysuria or hematuria, fever, chills, constipation. She hasn't taken anything at home for the pain. She does report that she had diarrhea 24 hours ago but that has resolved. She denies chest pain or shortness of breath. Patient reports no complications with this current pregnancy and she is currently at 3018 weeks gestational age. She had a previous pregnancy that was complicated by preeclampsia. She denies prior history of kidney stones.  Past Medical History:  Diagnosis Date  . Depression   . Diabetes mellitus without complication (HCC)   . PCOS (polycystic ovarian syndrome)     Patient Active Problem List   Diagnosis Date Noted  . Left flank pain 12/11/2015  . 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 Hx of preeclampsia, prior pregnancy, currently pregnant 10/18/2015  . Previous cesarean delivery, antepartum condition or complication 10/18/2015  . Depression affecting pregnancy in first trimester, antepartum 10/18/2015  . Supervision of high risk pregnancy in first trimester 10/06/2015  . Right flank pain 10/06/2015  . Pyelonephritis affecting pregnancy in first trimester 10/06/2015  . Pyelonephritis complicating pregnancy 10/06/2015     Past Surgical History:  Procedure Laterality Date  . CESAREAN SECTION    . CHOLECYSTECTOMY    . GASTRIC BYPASS  04/2015  . TONSILLECTOMY      Prior to Admission medications   Medication Sig Start Date End Date Taking? Authorizing Provider  aspirin 81 MG tablet Take 81 mg by mouth daily.   Yes Historical Provider, MD  nitrofurantoin, macrocrystal-monohydrate, (MACROBID) 100 MG capsule Take 100 mg by mouth 2 (two) times daily.   Yes Historical Provider, MD  Prenatal Vit-Fe Fumarate-FA (PRENATAL MULTIVITAMIN) TABS tablet Take 1 tablet by mouth daily at 12 noon.   Yes Historical Provider, MD  sertraline (ZOLOFT) 100 MG tablet Take 200 mg by mouth every morning.    Yes Historical Provider, MD    Allergies Cefdinir and Penicillins  Family History  Problem Relation Age of Onset  . Polycystic ovary syndrome Mother   . Diabetes Father   . Endometriosis Sister   . Polycystic ovary syndrome Sister   . Breast cancer Paternal Grandmother   . Diabetes Paternal Grandmother   . Diabetes Paternal Grandfather     Social History Social History  Substance Use Topics  . Smoking status: Never Smoker  . Smokeless tobacco: Never Used  . Alcohol use No    Review of Systems Constitutional: Negative for fever. Eyes: Negative for visual changes. ENT: Negative for sore throat. Cardiovascular: Negative for chest pain. Respiratory: Negative for shortness of breath. Gastrointestinal: + LLQ abdominal pain and nausea. No vomiting or diarrhea. Genitourinary: Negative for dysuria. + Left flank pain Musculoskeletal: Negative for back pain. Skin: Negative for rash. Neurological: Negative for headaches, weakness or numbness.  ____________________________________________   PHYSICAL EXAM:  VITAL SIGNS: ED Triage  Vitals  Enc Vitals Group     BP 12/11/15 0647 133/74     Pulse Rate 12/11/15 0647 70     Resp 12/11/15 0647 20     Temp 12/11/15 0647 97.5 F (36.4 C)     Temp Source 12/11/15  0647 Oral     SpO2 12/11/15 0647 100 %     Weight 12/11/15 0647 258 lb (117 kg)     Height 12/11/15 0647 6\' 1"  (1.854 m)     Head Circumference --      Peak Flow --      Pain Score 12/11/15 0642 9     Pain Loc --      Pain Edu? --      Excl. in GC? --     Constitutional: Alert and oriented, mild distress due to pain.  HEENT:      Head: Normocephalic and atraumatic.         Eyes: Conjunctivae are normal. Sclera is non-icteric. EOMI. PERRL      Mouth/Throat: Mucous membranes are moist.       Neck: Supple with no signs of meningismus. Cardiovascular: Regular rate and rhythm. No murmurs, gallops, or rubs. 2+ symmetrical distal pulses are present in all extremities. No JVD. Respiratory: Normal respiratory effort. Lungs are clear to auscultation bilaterally. No wheezes, crackles, or rhonchi.  Gastrointestinal: Soft, mildly ttp over the LLQ and suprapubic regions, and non distended with positive bowel sounds. No rebound or guarding. Genitourinary: moderate L CVA tenderness. Musculoskeletal: Nontender with normal range of motion in all extremities. No edema, cyanosis, or erythema of extremities. Neurologic: Normal speech and language. Face is symmetric. Moving all extremities. No gross focal neurologic deficits are appreciated. Skin: Skin is warm, dry and intact. No rash noted. Psychiatric: Mood and affect are normal. Speech and behavior are normal.  ____________________________________________   LABS (all labs ordered are listed, but only abnormal results are displayed)  Labs Reviewed  COMPREHENSIVE METABOLIC PANEL - Abnormal; Notable for the following:       Result Value   Potassium 3.3 (*)    Creatinine, Ser 0.40 (*)    Total Protein 6.4 (*)    All other components within normal limits  URINALYSIS COMPLETEWITH MICROSCOPIC ([facility] ONLY) - Abnormal; Notable for the following:    Color, Urine AMBER (*)    APPearance CLOUDY (*)    Hgb urine dipstick 3+ (*)    Protein, ur 30 (*)     Squamous Epithelial / LPF 6-30 (*)    All other components within normal limits  HCG, QUANTITATIVE, PREGNANCY - Abnormal; Notable for the following:    hCG, Beta Chain, Quant, S 14,677 (*)    All other components within normal limits  URINE CULTURE  CBC WITH DIFFERENTIAL/PLATELET   ____________________________________________  EKG  none  ____________________________________________  RADIOLOGY  Renal US: New L sided hydronephrosis ____________________________________________   PROCEDURES  Procedure(s) performed: None Procedures Critical Care performed:  None ____________________________________________   INITIAL IMPRESSION / ASSESSMENT AND PLAN / ED COURSE  30 y.o. female a history of remote gastric bypass surgery and recurrent UTIs currently on prophylactic macrobid who presents for evaluation of L flank pain radiating to the LLQ associated with nausea. Patient is in mild distress due to the pain, her vital signs are within normal limits. We'll check a CBC, CMP, hCG, UA. We'll treat her symptoms with IV fluids, IV Zofran, IV morphine. We'll do a bedside ultrasound to evaluate the fetus. If UA consistent with urinary tract  infection will treat appropriately. Also concern for possible kidney stone, therefore we'll pursue the ultrasound of her left kidney.  Clinical Course  Comment By Time  Renal ultrasound as showing new left-sided hydronephrosis, patient's urinalysis showing 3+ blood, 6-30 WBCs but no bacteria. I'm concerned the patient has a new kidney stone. She does not seem to have an overlying infection at this time. She continues to have pain and has received a second round of morphine at this time. I have paged urology and OB/GYN. I am planning to admit to OB/GYN for obvious and have urology follow in case patient develops an overlying infection. Nita Sickle, MD 08/08 [phone]  Spoke with Dr. Tula Nakayama from Urology who agrees with admission for obs and will follow patient  in house. Waiting to hear back from OB. Nita Sickle, MD 08/08 [phone]   Patient admitted to [facility] for obs.  Pertinent labs & imaging results that were available during my care of the patient were reviewed by me and considered in my medical decision making (see chart for details).    ____________________________________________   FINAL CLINICAL IMPRESSION(S) / ED DIAGNOSES  Final diagnoses:  Kidney stone complicating pregnancy, second trimester      NEW MEDICATIONS STARTED DURING THIS VISIT:  Current Discharge Medication List       Note:  This document was prepared using Dragon voice recognition software and may include unintentional dictation errors.    Nita Sickle, MD 12/11/15 [phone]

## 2015-12-12 LAB — URINE CULTURE

## 2015-12-12 MED ORDER — NITROFURANTOIN MONOHYD MACRO 100 MG PO CAPS: 100.0000 mg | ORAL_CAPSULE | Freq: Every day | ORAL | 5 refills | Status: DC

## 2015-12-12 MED ORDER — HYDROCODONE-ACETAMINOPHEN 5-325 MG PO TABS: 1.0000 | ORAL_TABLET | Freq: Four times a day (QID) | ORAL | 0 refills | Status: DC | PRN

## 2015-12-12 NOTE — Progress Notes (Signed)
Notify Kiara, Rn of vs

## 2015-12-12 NOTE — Discharge Summary (Signed)
Physician Discharge Summary  Patient ID: Maria Fuller MRN: [id number] DOB/AGE: [birth year] 30 y.o.  Admit date: 12/11/2015 Discharge date: 12/12/2015  Admission Diagnoses: G2P0101 at 47w4dwith acute onset left flank pain  Discharge Diagnoses:  Active Problems:   Left flank pain Mild left hydronephrosis, no evidence of kidney stones, hx of uti and now on suppression therapy, + fetal heart tones  Discharged Condition: good  Hospital Course: pt admitted inpatient, kidney ultrasound/urology consult, PO medications for pain, abx  Consults: urology  Significant Diagnostic Studies: labs:   Results for Maria, Fuller(MRN [id number] as of 12/12/2015 17:11  Ref. Range 12/11/2015 07:12 12/11/2015 07:27 12/11/2015 07:28 12/11/2015 08:36  Sodium Latest Ref Range: 135 - 145 mmol/L  138    Potassium Latest Ref Range: 3.5 - 5.1 mmol/L  3.3 (L)    Chloride Latest Ref Range: 101 - 111 mmol/L  110    CO2 Latest Ref Range: 22 - 32 mmol/L  22    BUN Latest Ref Range: 6 - 20 mg/dL  8    Creatinine Latest Ref Range: 0.44 - 1.00 mg/dL  0.40 (L)    Calcium Latest Ref Range: 8.9 - 10.3 mg/dL  8.9    EGFR (Non-African Amer.) Latest Ref Range: >60 mL/min  >60    EGFR (African American) Latest Ref Range: >60 mL/min  >60    Glucose Latest Ref Range: 65 - 99 mg/dL  81    Anion gap Latest Ref Range: 5 - 15   6    Alkaline Phosphatase Latest Ref Range: 38 - 126 U/L  52    Albumin Latest Ref Range: 3.5 - 5.0 g/dL  3.6    AST Latest Ref Range: 15 - 41 U/L  19    ALT Latest Ref Range: 14 - 54 U/L  25    Total Protein Latest Ref Range: 6.5 - 8.1 g/dL  6.4 (L)    Total Bilirubin Latest Ref Range: 0.3 - 1.2 mg/dL  0.8    HCG, Beta Chain, Quant, S Latest Ref Range: <5 mIU/mL   14,677 (H)   WBC Latest Ref Range: 3.6 - 11.0 K/uL  8.9    RBC Latest Ref Range: 3.80 - 5.20 MIL/uL  3.91    Hemoglobin Latest Ref Range: 12.0 - 16.0 g/dL  12.2    HCT Latest Ref Range: 35.0 - 47.0 %  35.2    MCV Latest Ref Range: 80.0 - 100.0  fL  90.2    MCH Latest Ref Range: 26.0 - 34.0 pg  31.3    MCHC Latest Ref Range: 32.0 - 36.0 g/dL  34.7    RDW Latest Ref Range: 11.5 - 14.5 %  13.4    Platelets Latest Ref Range: 150 - 440 K/uL  314    Neutrophils Latest Units: %  58    Lymphocytes Latest Units: %  34    Monocytes Relative Latest Units: %  6    Eosinophil Latest Units: %  2    Basophil Latest Units: %  0    NEUT# Latest Ref Range: 1.4 - 6.5 K/uL  5.2    Lymphocyte # Latest Ref Range: 1.0 - 3.6 K/uL  3.0    Monocyte # Latest Ref Range: 0.2 - 0.9 K/uL  0.5    Eosinophils Absolute Latest Ref Range: 0 - 0.7 K/uL  0.1    Basophils Absolute Latest Ref Range: 0 - 0.1 K/uL  0.0    Appearance Latest Ref Range: CLEAR  CLOUDY (  A)     Bacteria, UA Latest Ref Range: NONE SEEN  NONE SEEN     Bilirubin Urine Latest Ref Range: NEGATIVE  NEGATIVE     Ca Oxalate Crys, UA Unknown PRESENT     Color, Urine Latest Ref Range: YELLOW  AMBER (A)     Glucose Latest Ref Range: NEGATIVE mg/dL NEGATIVE     Hgb urine dipstick Latest Ref Range: NEGATIVE  3+ (A)     Ketones, ur Latest Ref Range: NEGATIVE mg/dL NEGATIVE     Leukocytes, UA Latest Ref Range: NEGATIVE  NEGATIVE     Mucous Unknown PRESENT     Nitrite Latest Ref Range: NEGATIVE  NEGATIVE     pH Latest Ref Range: 5.0 - 8.0  5.0     Protein Latest Ref Range: NEGATIVE mg/dL 30 (A)     RBC / HPF Latest Ref Range: 0 - 5 RBC/hpf TOO NUMEROUS TO C...     Specific Gravity, Urine Latest Ref Range: 1.005 - 1.030  1.023     Squamous Epithelial / LPF Latest Ref Range: NONE SEEN  6-30 (A)     WBC, UA Latest Ref Range: 0 - 5 WBC/hpf 6-30     URINE CULTURE Unknown Rpt (A)     US RENAL Unknown    Rpt    Treatments: antibiotics: macrobid and analgesia: Vicodin  Discharge Exam: Blood pressure (!) 107/52, pulse (!) 54, temperature 98 F (36.7 C), temperature source Oral, resp. rate 20, height 6' 1" (1.854 m), weight 117 kg (258 lb), last menstrual period 08/11/2015, SpO2 98 %. General appearance:  alert, cooperative, appears stated age and no distress Resp: clear to auscultation bilaterally Cardio: regular rate and rhythm  Fetus: spot Doppler 150 bpm  Disposition: 01-Home or Self Care  Discharge Instructions    Discharge activity:  No Restrictions    Complete by:  As directed   Discharge diet:  No restrictions    Complete by:  As directed   Stay hydrated with enough h20 for your urine to run clear/light yellow   No sexual activity restrictions    Complete by:  As directed       Medication List    TAKE these medications   aspirin 81 MG tablet Take 81 mg by mouth daily.   HYDROcodone-acetaminophen 5-325 MG tablet Commonly known as:  NORCO/VICODIN Take 1-2 tablets by mouth every 6 (six) hours as needed for moderate pain or severe pain.   nitrofurantoin (macrocrystal-monohydrate) 100 MG capsule Commonly known as:  MACROBID Take 1 capsule (100 mg total) by mouth at bedtime. What changed:  when to take this   prenatal multivitamin Tabs tablet Take 1 tablet by mouth daily at 12 noon.   sertraline 100 MG tablet Commonly known as:  ZOLOFT Take 200 mg by mouth every morning.      Follow-up Information    Will Bonnet, MD .   Specialty:  Obstetrics and Gynecology Why:  regular scheduled prenatal care at [facility] information: [address] [place] [address] [phone]           Signed: Rod Can, CNM

## 2015-12-12 NOTE — Progress Notes (Signed)
RN reviewed discharge instructions with pt; RN gave pt prescriptions; pt discharged at this time via wheelchair escorted by nurse tech; pt going home with significant other

## 2015-12-12 NOTE — Progress Notes (Signed)
Pt concerned about pain yesterday, it was severe and new to her This am, not in pain; no nausea. See urology consult.  Mild left hydronephrosis but no evidence for stone and signs of infection. Cultures pending.  AF, VSS- see records and trends ABD- S, ND, NT, gravid FHTs 140s BACK- no CVAT Extr- no edema  18 weeks, pain On Macrobid for suppression based on past hx No evidence now for pyelo, stone Monitor for recurrence of pain episode Diet, Ambulate Consider discharge later

## 2016-01-10 ENCOUNTER — Other Ambulatory Visit: Payer: Self-pay | Admitting: Obstetrics & Gynecology

## 2016-01-10 DIAGNOSIS — N1330 Unspecified hydronephrosis: Secondary | ICD-10-CM

## 2016-01-15 ENCOUNTER — Ambulatory Visit: Payer: BC Managed Care – PPO

## 2016-01-15 ENCOUNTER — Ambulatory Visit
Admission: RE | Admit: 2016-01-15 | Discharge: 2016-01-15 | Disposition: A | Discharge status: Home / Self Care | Payer: BC Managed Care – PPO | Source: Ambulatory Visit | Attending: Obstetrics & Gynecology | Admitting: Obstetrics & Gynecology

## 2016-01-15 DIAGNOSIS — N1330 Unspecified hydronephrosis: Secondary | ICD-10-CM

## 2016-02-10 ENCOUNTER — Observation Stay
Admission: EM | Admit: 2016-02-10 | Discharge: 2016-02-10 | Disposition: A | Discharge status: Home / Self Care | Payer: BC Managed Care – PPO | Attending: Certified Nurse Midwife | Admitting: Certified Nurse Midwife

## 2016-02-10 ENCOUNTER — Encounter: Payer: Self-pay | Admitting: *Deleted

## 2016-02-10 DIAGNOSIS — R51 Headache: Secondary | ICD-10-CM | POA: Diagnosis not present

## 2016-02-10 DIAGNOSIS — Z3A26 26 weeks gestation of pregnancy: Secondary | ICD-10-CM | POA: Diagnosis not present

## 2016-02-10 DIAGNOSIS — O09299 Supervision of pregnancy with other poor reproductive or obstetric history, unspecified trimester: Secondary | ICD-10-CM

## 2016-02-10 DIAGNOSIS — F32A Depression, unspecified: Secondary | ICD-10-CM

## 2016-02-10 DIAGNOSIS — Z9884 Bariatric surgery status: Secondary | ICD-10-CM | POA: Insufficient documentation

## 2016-02-10 DIAGNOSIS — O26892 Other specified pregnancy related conditions, second trimester: Secondary | ICD-10-CM | POA: Diagnosis not present

## 2016-02-10 DIAGNOSIS — Z9049 Acquired absence of other specified parts of digestive tract: Secondary | ICD-10-CM | POA: Insufficient documentation

## 2016-02-10 DIAGNOSIS — O99341 Other mental disorders complicating pregnancy, first trimester: Secondary | ICD-10-CM

## 2016-02-10 DIAGNOSIS — F329 Major depressive disorder, single episode, unspecified: Secondary | ICD-10-CM

## 2016-02-10 DIAGNOSIS — R1013 Epigastric pain: Secondary | ICD-10-CM | POA: Insufficient documentation

## 2016-02-10 DIAGNOSIS — O26899 Other specified pregnancy related conditions, unspecified trimester: Secondary | ICD-10-CM | POA: Diagnosis present

## 2016-02-10 DIAGNOSIS — R109 Unspecified abdominal pain: Secondary | ICD-10-CM

## 2016-02-10 DIAGNOSIS — Z87440 Personal history of urinary (tract) infections: Secondary | ICD-10-CM | POA: Insufficient documentation

## 2016-02-10 DIAGNOSIS — O99841 Bariatric surgery status complicating pregnancy, first trimester: Secondary | ICD-10-CM

## 2016-02-10 LAB — COMPREHENSIVE METABOLIC PANEL
ALT: 25 U/L (ref 14–54)
AST: 15 U/L (ref 15–41)
Albumin: 3.1 g/dL — ABNORMAL LOW (ref 3.5–5.0)
Alkaline Phosphatase: 51 U/L (ref 38–126)
Anion gap: 4 — ABNORMAL LOW (ref 5–15)
BUN: 9 mg/dL (ref 6–20)
CHLORIDE: 109 mmol/L (ref 101–111)
CO2: 23 mmol/L (ref 22–32)
CREATININE: 0.43 mg/dL — AB (ref 0.44–1.00)
Calcium: 8.2 mg/dL — ABNORMAL LOW (ref 8.9–10.3)
Glucose, Bld: 79 mg/dL (ref 65–99)
POTASSIUM: 3.6 mmol/L (ref 3.5–5.1)
Sodium: 136 mmol/L (ref 135–145)
Total Bilirubin: 0.9 mg/dL (ref 0.3–1.2)
Total Protein: 5.8 g/dL — ABNORMAL LOW (ref 6.5–8.1)

## 2016-02-10 LAB — PROTEIN / CREATININE RATIO, URINE
Creatinine, Urine: 136 mg/dL
PROTEIN CREATININE RATIO: 0.12 mg/mg{creat} (ref 0.00–0.15)
Total Protein, Urine: 16 mg/dL

## 2016-02-10 LAB — CBC
HEMATOCRIT: 31.3 % — AB (ref 35.0–47.0)
Hemoglobin: 10.9 g/dL — ABNORMAL LOW (ref 12.0–16.0)
MCH: 32.2 pg (ref 26.0–34.0)
MCHC: 34.9 g/dL (ref 32.0–36.0)
MCV: 92.3 fL (ref 80.0–100.0)
PLATELETS: 283 10*3/uL (ref 150–440)
RBC: 3.39 MIL/uL — AB (ref 3.80–5.20)
RDW: 13.6 % (ref 11.5–14.5)
WBC: 8.1 10*3/uL (ref 3.6–11.0)

## 2016-02-10 LAB — LIPASE, BLOOD: LIPASE: 14 U/L (ref 11–51)

## 2016-02-10 MED ORDER — FAMOTIDINE 20 MG PO TABS
20.0000 mg | ORAL_TABLET | Freq: Once | ORAL | Status: AC
  Administered 2016-02-10: 20 mg via ORAL

## 2016-02-10 MED ORDER — BUTALBITAL-APAP-CAFFEINE 50-325-40 MG PO TABS
2.0000 | ORAL_TABLET | Freq: Once | ORAL | Status: AC
  Administered 2016-02-10: 2 via ORAL

## 2016-02-10 MED ORDER — FAMOTIDINE 20 MG PO TABS
ORAL_TABLET | ORAL | Status: AC
  Administered 2016-02-10: 20 mg via ORAL
  Filled 2016-02-10: qty 1

## 2016-02-10 MED ORDER — BUTALBITAL-APAP-CAFFEINE 50-325-40 MG PO TABS
ORAL_TABLET | ORAL | Status: AC
  Administered 2016-02-10: 2 via ORAL
  Filled 2016-02-10: qty 2

## 2016-02-10 NOTE — OB Triage Note (Signed)
C/O burning with urination since Thursday. Culture sent Thursday with [facility] [place]. Just finished last dose of abx for double ear infection. C/O RUQ pain/pressure, constant. Nausea off and on since last Monday. C/O diarrhea turning to constipation. H/A started this am. Reports pain is on top of her head. Denies seeing spots. Denies blurred vision. C/O dizziness. Has a history of migraines. Hx of preeclampsia with first pregnancy @ 34 weeks. Maria Fuller, Maria Fuller

## 2016-02-10 NOTE — Final Progress Note (Signed)
Physician Final Progress Note  Patient ID: Maria Fuller MRN: [id number] DOB/AGE: July 13, 1985 30 y.o.  Admit date: 02/10/2016 Admitting provider: Conard Novak, MD Discharge date: 02/10/2016   Admission Diagnoses: Upper abdominal/ epigastric pain Headache IUP at [facility]  Discharge Diagnoses:  IUP at [facility] Improved abdominal pain Improved headache Consults: none  Significant Findings/ Diagnostic Studies: 30 year old G2 P0101 WF with EDC=05/17/2016 presented at [facility] with complaints of headache and upper abdominal pain. Has had a headache on the crown of her head off and on all week, but this morning it woke her up and is constant and throbbing. Does not feel like her migraine headaches.Had some black spots in her vision earlier but not currently. Has not taken any medication for the headache. Her upper abdominal pain began yesterday in the RUQ. She has had nausea intermittently this past week. She has also had loose stools starting last weekend, then went a few days without a BM. When she started with the upper abdominal pain, she thought it was from constipation, so she took Miralax...had a BM today..but that did not relieve the upper abdominal pain. ON Monday 10/2 she was seen by her PCP and diagnosed with bilateral otitis media and started on Azithromycin. She completed that 5 day course of antibiotics on Friday. She has also been taking Macrobid daily for PPX due to a history of frequent UTIs and possible pyelo in early pregnancy. Has been feeling FM. Prenatal care also remarkable for a history of preeclampsia with her first pregnancy at 54 week, a LTCS with G1, depression (currently taking Zoloft 200 mgm daily), history of morbid obesity and a gastric sleeve (current BMI=36.8) Past Medical Hx is remarkable for [facility] and DM She had lost 130# prior to her pregnancy after having bariatric surgery and her hypertension and diabetes had resolved with her weight loss. She has lost another 21#  with her pregnancy. Hx of migraine Past Surgical History: Cholecystectomy, tonsilectomy, gastric sleeve, Cesarean section Allergies: Ceftin, PCN ROS (as documented in HPI) and also positive for reflux/heartburn (takes Zantac), dark colored urine and chills and negative for blood in stool, dysuria, fever.  Exam: BP (!) 108/57 (BP Location: Right Arm)   Pulse (!) 48   Temp 99.2 F (37.3 C) (Oral)   Resp 16   Ht 6\' 1"  (1.854 m)   Wt 117 kg (258 lb)   LMP 08/11/2015 (Exact Date)   BMI 34.04 kg/m   General: alert, awake, answering questions appropriately Abdomen: bowel sounds active, gravid, tenderness in RUQ with deep palpation, non distended. FHR 145 with accelerations to 155 Toco: no contractions seen Results for orders placed or performed during the hospital encounter of 02/10/16 (from the past 24 hour(s))  Protein / creatinine ratio, urine     Status: None   Collection Time: 02/10/16  9:12 AM  Result Value Ref Range   Creatinine, Urine 136 mg/dL   Total Protein, Urine 16 mg/dL   Protein Creatinine Ratio 0.12 0.00 - 0.15 mg/mg[Cre]  Comprehensive metabolic panel     Status: Abnormal   Collection Time: 02/10/16  9:22 AM  Result Value Ref Range   Sodium 136 135 - 145 mmol/L   Potassium 3.6 3.5 - 5.1 mmol/L   Chloride 109 101 - 111 mmol/L   CO2 23 22 - 32 mmol/L   Glucose, Bld 79 65 - 99 mg/dL   BUN 9 6 - 20 mg/dL   Creatinine, Ser 4.09 (L) 0.44 - 1.00 mg/dL   Calcium 8.2 (  L) 8.9 - 10.3 mg/dL   Total Protein 5.8 (L) 6.5 - 8.1 g/dL   Albumin 3.1 (L) 3.5 - 5.0 g/dL   AST 15 15 - 41 U/L   ALT 25 14 - 54 U/L   Alkaline Phosphatase 51 38 - 126 U/L   Total Bilirubin 0.9 0.3 - 1.2 mg/dL   GFR calc non Af Amer >60 >60 mL/min   GFR calc Af Amer >60 >60 mL/min   Anion gap 4 (L) 5 - 15  CBC     Status: Abnormal   Collection Time: 02/10/16  9:22 AM  Result Value Ref Range   WBC 8.1 3.6 - 11.0 K/uL   RBC 3.39 (L) 3.80 - 5.20 MIL/uL   Hemoglobin 10.9 (L) 12.0 - 16.0 g/dL   HCT 16.131.3  (L) 09.635.0 - 47.0 %   MCV 92.3 80.0 - 100.0 fL   MCH 32.2 26.0 - 34.0 pg   MCHC 34.9 32.0 - 36.0 g/dL   RDW 04.513.6 40.911.5 - 81.114.5 %   Platelets 283 150 - 440 K/uL  Lipase, blood     Status: None   Collection Time: 02/10/16  9:22 AM  Result Value Ref Range   Lipase 14 11 - 51 U/L   A: Suspect upper abdominal cramping/ GI upset may be due to antibiotic side effect Headache-possibly tension headache Can not rule out viral syndrome No evidence of preeclampsia P: Treated with Fioricet and Pepcid with almost total relief. Patient was hungry and ready for discharge. Advised to stop Macrobid for the next week. Increase fluid intake [facility]   Procedures: none  Discharge Condition: stable  Disposition: 01-Home or Self Care  Diet: Encourage fluids and as tolerated  Discharge Activity: Activity as tolerated     Medication List    STOP taking these medications   azithromycin 250 MG tablet Commonly known as:  ZITHROMAX   HYDROcodone-acetaminophen 5-325 MG tablet Commonly known as:  NORCO/VICODIN   nitrofurantoin (macrocrystal-monohydrate) 100 MG capsule Commonly known as:  MACROBID     TAKE these medications   aspirin 81 MG tablet Take 81 mg by mouth daily.   prenatal multivitamin Tabs tablet Take 1 tablet by mouth daily at 12 noon.   sertraline 100 MG tablet Commonly known as:  ZOLOFT Take 200 mg by mouth every morning.      Follow-up Information    Jash Wahlen, CNM. Schedule an appointment as soon as possible for a visit today.   Specialty:  Certified Nurse Midwife Why:  Call tomorrow, Monday to make an appointment to be seen in the office this week. Contact information: [address]  [place] [phone]           Total time spent taking care of this patient: 20 minutes  Signed: Farrel ConnersGUTIERREZ, Laurren Lepkowski 02/10/2016, 11:03 PM

## 2016-02-26 ENCOUNTER — Observation Stay
Admission: EM | Admit: 2016-02-26 | Discharge: 2016-02-26 | Disposition: A | Discharge status: Home / Self Care | Payer: BC Managed Care – PPO | Attending: Obstetrics and Gynecology | Admitting: Obstetrics and Gynecology

## 2016-02-26 ENCOUNTER — Encounter: Payer: Self-pay | Admitting: *Deleted

## 2016-02-26 DIAGNOSIS — O9989 Other specified diseases and conditions complicating pregnancy, childbirth and the puerperium: Secondary | ICD-10-CM | POA: Insufficient documentation

## 2016-02-26 DIAGNOSIS — Z9049 Acquired absence of other specified parts of digestive tract: Secondary | ICD-10-CM | POA: Diagnosis not present

## 2016-02-26 DIAGNOSIS — Z880 Allergy status to penicillin: Secondary | ICD-10-CM | POA: Diagnosis not present

## 2016-02-26 DIAGNOSIS — R109 Unspecified abdominal pain: Secondary | ICD-10-CM | POA: Diagnosis not present

## 2016-02-26 DIAGNOSIS — O99343 Other mental disorders complicating pregnancy, third trimester: Secondary | ICD-10-CM | POA: Insufficient documentation

## 2016-02-26 DIAGNOSIS — Z888 Allergy status to other drugs, medicaments and biological substances status: Secondary | ICD-10-CM | POA: Insufficient documentation

## 2016-02-26 DIAGNOSIS — Z7982 Long term (current) use of aspirin: Secondary | ICD-10-CM | POA: Diagnosis not present

## 2016-02-26 DIAGNOSIS — O26893 Other specified pregnancy related conditions, third trimester: Secondary | ICD-10-CM | POA: Diagnosis not present

## 2016-02-26 DIAGNOSIS — O99843 Bariatric surgery status complicating pregnancy, third trimester: Secondary | ICD-10-CM | POA: Insufficient documentation

## 2016-02-26 DIAGNOSIS — Z79899 Other long term (current) drug therapy: Secondary | ICD-10-CM | POA: Insufficient documentation

## 2016-02-26 DIAGNOSIS — O99841 Bariatric surgery status complicating pregnancy, first trimester: Secondary | ICD-10-CM

## 2016-02-26 DIAGNOSIS — E282 Polycystic ovarian syndrome: Secondary | ICD-10-CM | POA: Diagnosis not present

## 2016-02-26 DIAGNOSIS — R197 Diarrhea, unspecified: Secondary | ICD-10-CM | POA: Diagnosis not present

## 2016-02-26 DIAGNOSIS — O24913 Unspecified diabetes mellitus in pregnancy, third trimester: Secondary | ICD-10-CM | POA: Diagnosis not present

## 2016-02-26 DIAGNOSIS — R112 Nausea with vomiting, unspecified: Secondary | ICD-10-CM | POA: Diagnosis not present

## 2016-02-26 DIAGNOSIS — F329 Major depressive disorder, single episode, unspecified: Secondary | ICD-10-CM | POA: Insufficient documentation

## 2016-02-26 DIAGNOSIS — Z3A28 28 weeks gestation of pregnancy: Secondary | ICD-10-CM | POA: Diagnosis not present

## 2016-02-26 DIAGNOSIS — O99341 Other mental disorders complicating pregnancy, first trimester: Secondary | ICD-10-CM

## 2016-02-26 DIAGNOSIS — O09299 Supervision of pregnancy with other poor reproductive or obstetric history, unspecified trimester: Secondary | ICD-10-CM

## 2016-02-26 LAB — URINALYSIS COMPLETE WITH MICROSCOPIC (ARMC ONLY)
BACTERIA UA: NONE SEEN
BILIRUBIN URINE: NEGATIVE
Glucose, UA: NEGATIVE mg/dL
HGB URINE DIPSTICK: NEGATIVE
KETONES UR: NEGATIVE mg/dL
LEUKOCYTES UA: NEGATIVE
NITRITE: NEGATIVE
PH: 5 (ref 5.0–8.0)
PROTEIN: NEGATIVE mg/dL
RBC / HPF: NONE SEEN RBC/hpf (ref 0–5)
SPECIFIC GRAVITY, URINE: 1.021 (ref 1.005–1.030)

## 2016-02-26 MED ORDER — LACTATED RINGERS IV SOLN
INTRAVENOUS | Status: DC
  Administered 2016-02-26: 11:00:00 via INTRAVENOUS

## 2016-02-26 MED ORDER — LOPERAMIDE HCL 2 MG PO CAPS
2.0000 mg | ORAL_CAPSULE | ORAL | Status: DC | PRN
Start: 2016-02-26 — End: 2016-02-26

## 2016-02-26 MED ORDER — DIPHENOXYLATE-ATROPINE 2.5-0.025 MG PO TABS
1.0000 | ORAL_TABLET | Freq: Once | ORAL | Status: AC
  Administered 2016-02-26: 1 via ORAL
  Filled 2016-02-26: qty 1

## 2016-02-26 NOTE — OB Triage Note (Signed)
N/V/D since 0300 yesterday. Was seen in OB office yesterday. Last emesis was last night at 1800. Has been taking Zofran. Last episode of diarrhea was @ 0800 this am. Has been taking Imodium. Last dose @ 0700 this am. Temp this am @ home was 100.5, took tylenol. Elaina HoopsElks, Shanette Tamargo S

## 2016-02-26 NOTE — Discharge Summary (Signed)
Physician Final Progress Note  Patient ID: Maria Fuller MRN: [id number] DOB/AGE: April 18, 1986 30 y.o.  Admit date: 02/26/2016 Admitting provider: Tresea Mall, CNM Discharge date: 02/26/2016   Admission Diagnoses: G2 P0101 at [redacted]w[redacted]d with acute onset nausea, vomiting, diarrhea beginning on 10/23. She was seen in clinic yesterday and received Rx for zofran and was told to come to hospital with continuing symptoms today. Pt states nausea and vomiting improved last night, however, diarrhea has persisted. Pt admits positive fetal movement. She denies contractions, LOF/VB  Discharge Diagnoses:  IUP at [redacted]w[redacted]d with positive fetal heart tones on greater than 20 minute strip, GI upset with improvement following IV hydration and anti diarrheal medication  Hospital Course: Pt was admitted for observation, placed on monitors, IV hydration with LR, labs done, anti diarrheal med given  Past Medical History:  Diagnosis Date  . Depression   . Diabetes mellitus without complication (HCC)   . PCOS (polycystic ovarian syndrome)   . Preterm labor     Past Surgical History:  Procedure Laterality Date  . CESAREAN SECTION    . CHOLECYSTECTOMY    . GASTRIC BYPASS  04/2015  . TONSILLECTOMY      No current facility-administered medications on file prior to encounter.    Current Outpatient Prescriptions on File Prior to Encounter  Medication Sig Dispense Refill  . aspirin 81 MG tablet Take 81 mg by mouth daily.    . Prenatal Vit-Fe Fumarate-FA (PRENATAL MULTIVITAMIN) TABS tablet Take 1 tablet by mouth daily at 12 noon.    . sertraline (ZOLOFT) 100 MG tablet Take 200 mg by mouth every morning.       Allergies  Allergen Reactions  . Cefdinir Rash  . Penicillins Rash    Has patient had a PCN reaction causing immediate rash, facial/tongue/throat swelling, SOB or lightheadedness with hypotension: Yes Has patient had a PCN reaction causing severe rash involving mucus membranes or skin necrosis: No Has  patient had a PCN reaction that required hospitalization No Has patient had a PCN reaction occurring within the last 10 years: No If all of the above answers are "NO", then may proceed with Cephalosporin use.    Social History   Social History  . Marital status: Married    Spouse name: N/A  . Number of children: N/A  . Years of education: N/A   Occupational History  . Not on file.   Social History Main Topics  . Smoking status: Never Smoker  . Smokeless tobacco: Never Used  . Alcohol use No  . Drug use: No  . Sexual activity: Yes    Birth control/ protection: IUD   Other Topics Concern  . Not on file   Social History Narrative  . No narrative on file    Physical Exam: BP (!) 122/58 (BP Location: Right Arm)   Pulse (!) 52   Temp 99 F (37.2 C) (Oral)   Resp 16   LMP 08/11/2015 (Exact Date)   Gen: NAD CV: RRR Pulm: CTAB Pelvic: deferred Ext: no evidence of DVT  Consults: None  Significant Findings/ Diagnostic Studies: labs:   Results for MEAGEN, LIMONES (MRN [id number]) as of 02/26/2016 15:27  Ref. Range 02/10/2016 09:22 02/26/2016 10:10  Appearance Latest Ref Range: CLEAR   HAZY (A)  Bacteria, UA Latest Ref Range: NONE SEEN   NONE SEEN  Bilirubin Urine Latest Ref Range: NEGATIVE   NEGATIVE  Ca Oxalate Crys, UA Unknown  PRESENT  Color, Urine Latest Ref Range: YELLOW   YELLOW (  A)  Glucose Latest Ref Range: NEGATIVE mg/dL  NEGATIVE  Hgb urine dipstick Latest Ref Range: NEGATIVE   NEGATIVE  Ketones, ur Latest Ref Range: NEGATIVE mg/dL  NEGATIVE  Leukocytes, UA Latest Ref Range: NEGATIVE   NEGATIVE  Mucous Unknown  PRESENT  Nitrite Latest Ref Range: NEGATIVE   NEGATIVE  pH Latest Ref Range: 5.0 - 8.0   5.0  Protein Latest Ref Range: NEGATIVE mg/dL  NEGATIVE  RBC / HPF Latest Ref Range: 0 - 5 RBC/hpf  NONE SEEN  Specific Gravity, Urine Latest Ref Range: 1.005 - 1.030   1.021  Squamous Epithelial / LPF Latest Ref Range: NONE SEEN   0-5 (A)  WBC, UA Latest Ref  Range: 0 - 5 WBC/hpf  0-5    Procedures: NST  Discharge Condition: fair  Disposition: 01-Home or Self Care  Diet: Clear liquid diet, advance as tolerated  Discharge Activity: Activity as tolerated  Discharge Instructions    Discharge activity:  No Restrictions    Complete by:  As directed    Discharge diet:    Complete by:  As directed    Clear liquids and light foods- advance as tolerated   No sexual activity restrictions    Complete by:  As directed    Notify physician for a general feeling that "something is not right"    Complete by:  As directed    Notify physician for increase or change in vaginal discharge    Complete by:  As directed    Notify physician for intestinal cramps, with or without diarrhea, sometimes described as "gas pain"    Complete by:  As directed    Notify physician for leaking of fluid    Complete by:  As directed    Notify physician for low, dull backache, unrelieved by heat or Tylenol    Complete by:  As directed    Notify physician for menstrual like cramps    Complete by:  As directed    Notify physician for pelvic pressure    Complete by:  As directed    Notify physician for uterine contractions.  These may be painless and feel like the uterus is tightening or the baby is  "balling up"    Complete by:  As directed    Notify physician for vaginal bleeding    Complete by:  As directed    PRETERM LABOR:  Includes any of the follwing symptoms that occur between 20 - [redacted] weeks gestation.  If these symptoms are not stopped, preterm labor can result in preterm delivery, placing your baby at risk    Complete by:  As directed        Medication List    TAKE these medications   aspirin 81 MG tablet Take 81 mg by mouth daily.   ondansetron 8 MG tablet Commonly known as:  ZOFRAN Take 8 mg by mouth every 8 (eight) hours as needed for nausea or vomiting.   prenatal multivitamin Tabs tablet Take 1 tablet by mouth daily at 12 noon.   sertraline  100 MG tablet Commonly known as:  ZOLOFT Take 200 mg by mouth every morning.      Follow-up Information    [facility] [place] .   Why:  go to your regular scheduled prenatal appointment Contact information: [address] [place] [address] [phone]           Total time spent taking care of this patient: 30 minutes  Signed: Obie DredgeGLEDHILL,Shawnae Leiva, CNM  02/26/2016,  3:23 PM

## 2016-04-09 ENCOUNTER — Inpatient Hospital Stay
Admission: EM | Admit: 2016-04-09 | Discharge: 2016-04-09 | Disposition: A | Discharge status: Home / Self Care | Payer: BC Managed Care – PPO | Attending: Obstetrics & Gynecology | Admitting: Obstetrics & Gynecology

## 2016-04-09 ENCOUNTER — Emergency Department
Admission: EM | Admit: 2016-04-09 | Discharge: 2016-04-09 | Disposition: A | Discharge status: Home / Self Care | Payer: BC Managed Care – PPO | Source: Home / Self Care | Attending: Emergency Medicine | Admitting: Emergency Medicine

## 2016-04-09 ENCOUNTER — Encounter: Payer: Self-pay | Admitting: Emergency Medicine

## 2016-04-09 DIAGNOSIS — Z3A39 39 weeks gestation of pregnancy: Secondary | ICD-10-CM | POA: Diagnosis present

## 2016-04-09 DIAGNOSIS — O99841 Bariatric surgery status complicating pregnancy, first trimester: Secondary | ICD-10-CM | POA: Diagnosis present

## 2016-04-09 DIAGNOSIS — E119 Type 2 diabetes mellitus without complications: Secondary | ICD-10-CM | POA: Insufficient documentation

## 2016-04-09 DIAGNOSIS — Z79899 Other long term (current) drug therapy: Secondary | ICD-10-CM | POA: Insufficient documentation

## 2016-04-09 DIAGNOSIS — O99843 Bariatric surgery status complicating pregnancy, third trimester: Secondary | ICD-10-CM | POA: Insufficient documentation

## 2016-04-09 DIAGNOSIS — Z9889 Other specified postprocedural states: Secondary | ICD-10-CM | POA: Insufficient documentation

## 2016-04-09 DIAGNOSIS — Z7982 Long term (current) use of aspirin: Secondary | ICD-10-CM | POA: Insufficient documentation

## 2016-04-09 DIAGNOSIS — O99341 Other mental disorders complicating pregnancy, first trimester: Secondary | ICD-10-CM

## 2016-04-09 DIAGNOSIS — Z3A34 34 weeks gestation of pregnancy: Secondary | ICD-10-CM | POA: Insufficient documentation

## 2016-04-09 DIAGNOSIS — O2303 Infections of kidney in pregnancy, third trimester: Secondary | ICD-10-CM | POA: Diagnosis not present

## 2016-04-09 DIAGNOSIS — O99343 Other mental disorders complicating pregnancy, third trimester: Secondary | ICD-10-CM | POA: Insufficient documentation

## 2016-04-09 DIAGNOSIS — N1330 Unspecified hydronephrosis: Secondary | ICD-10-CM | POA: Diagnosis not present

## 2016-04-09 DIAGNOSIS — R109 Unspecified abdominal pain: Secondary | ICD-10-CM | POA: Insufficient documentation

## 2016-04-09 DIAGNOSIS — E282 Polycystic ovarian syndrome: Secondary | ICD-10-CM | POA: Insufficient documentation

## 2016-04-09 DIAGNOSIS — O26893 Other specified pregnancy related conditions, third trimester: Secondary | ICD-10-CM

## 2016-04-09 DIAGNOSIS — Z9049 Acquired absence of other specified parts of digestive tract: Secondary | ICD-10-CM | POA: Insufficient documentation

## 2016-04-09 DIAGNOSIS — O99283 Endocrine, nutritional and metabolic diseases complicating pregnancy, third trimester: Secondary | ICD-10-CM | POA: Insufficient documentation

## 2016-04-09 DIAGNOSIS — F329 Major depressive disorder, single episode, unspecified: Secondary | ICD-10-CM | POA: Insufficient documentation

## 2016-04-09 DIAGNOSIS — Z880 Allergy status to penicillin: Secondary | ICD-10-CM | POA: Diagnosis not present

## 2016-04-09 DIAGNOSIS — R319 Hematuria, unspecified: Secondary | ICD-10-CM | POA: Insufficient documentation

## 2016-04-09 DIAGNOSIS — O34219 Maternal care for unspecified type scar from previous cesarean delivery: Secondary | ICD-10-CM | POA: Diagnosis present

## 2016-04-09 DIAGNOSIS — O09299 Supervision of pregnancy with other poor reproductive or obstetric history, unspecified trimester: Secondary | ICD-10-CM

## 2016-04-09 LAB — CBC
HEMATOCRIT: 35.4 % (ref 35.0–47.0)
Hemoglobin: 12.2 g/dL (ref 12.0–16.0)
MCH: 31.7 pg (ref 26.0–34.0)
MCHC: 34.3 g/dL (ref 32.0–36.0)
MCV: 92.4 fL (ref 80.0–100.0)
PLATELETS: 342 10*3/uL (ref 150–440)
RBC: 3.83 MIL/uL (ref 3.80–5.20)
RDW: 12.6 % (ref 11.5–14.5)
WBC: 9.1 10*3/uL (ref 3.6–11.0)

## 2016-04-09 LAB — URINALYSIS, COMPLETE (UACMP) WITH MICROSCOPIC
BILIRUBIN URINE: NEGATIVE
Bacteria, UA: NONE SEEN
GLUCOSE, UA: NEGATIVE mg/dL
Ketones, ur: NEGATIVE mg/dL
LEUKOCYTES UA: NEGATIVE
NITRITE: NEGATIVE
PH: 5 (ref 5.0–8.0)
Protein, ur: NEGATIVE mg/dL
SPECIFIC GRAVITY, URINE: 1.017 (ref 1.005–1.030)

## 2016-04-09 LAB — HEPATIC FUNCTION PANEL
ALT: 18 U/L (ref 14–54)
AST: 17 U/L (ref 15–41)
Albumin: 3.2 g/dL — ABNORMAL LOW (ref 3.5–5.0)
Alkaline Phosphatase: 93 U/L (ref 38–126)
BILIRUBIN DIRECT: 0.2 mg/dL (ref 0.1–0.5)
BILIRUBIN INDIRECT: 0.3 mg/dL (ref 0.3–0.9)
BILIRUBIN TOTAL: 0.5 mg/dL (ref 0.3–1.2)
Total Protein: 6.2 g/dL — ABNORMAL LOW (ref 6.5–8.1)

## 2016-04-09 LAB — BASIC METABOLIC PANEL
Anion gap: 5 (ref 5–15)
BUN: 7 mg/dL (ref 6–20)
CHLORIDE: 107 mmol/L (ref 101–111)
CO2: 24 mmol/L (ref 22–32)
CREATININE: 0.46 mg/dL (ref 0.44–1.00)
Calcium: 8.6 mg/dL — ABNORMAL LOW (ref 8.9–10.3)
GFR calc Af Amer: >60 mL/min (ref >60)
GFR calc non Af Amer: >60 mL/min (ref >60)
Glucose, Bld: 78 mg/dL (ref 65–99)
POTASSIUM: 3.5 mmol/L (ref 3.5–5.1)
SODIUM: 136 mmol/L (ref 135–145)

## 2016-04-09 NOTE — H&P (Signed)
Obstetrics Admission History & Physical   CC: RIGHT FLANK PAIN  HPI:  30 y.o. [id number] @ [redacted]w[redacted]d(05/15/2016, Date entered prior to episode creation). Admitted on 04/09/2016:   Patient Active Problem List   Diagnosis Date Noted  . Labor and delivery indication for care or intervention 02/26/2016  . Abdominal pain in pregnancy 02/10/2016  . Left flank pain 12/11/2015  . 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 Hx of preeclampsia, prior pregnancy, currently pregnant 10/18/2015  . Previous cesarean delivery, antepartum condition or complication 045/80/9983 . Depression affecting pregnancy in first trimester, antepartum 10/18/2015  . Supervision of high risk pregnancy in first trimester 10/06/2015  . Right flank pain 10/06/2015  . Pyelonephritis affecting pregnancy in first trimester 10/06/2015  . Pyelonephritis complicating pregnancy 038/25/0539    Presents for Right flank pain last night and this am, also noted hematuria today.  Prior h/o right hydronephrosis with intermittant hematuria this pregnancy.  No ctxs, VB, ROM. Good FM.  [facility] PMHx:  Past Medical History:  Diagnosis Date  . Depression   . Diabetes mellitus [place] complication [place]   . PCOS (polycystic ovarian syndrome)   . Preterm labor    PSHx:  Past Surgical History:  Procedure Laterality Date  . CESAREAN SECTION    . CHOLECYSTECTOMY    . GASTRIC BYPASS  04/2015  . TONSILLECTOMY     Medications:  Prescriptions Prior to Admission  Medication Sig Dispense Refill Last Dose  . aspirin 81 MG tablet Take 81 mg by mouth daily.   04/09/2016  . ondansetron (ZOFRAN) 8 MG tablet Take 8 mg by mouth every 8 (eight) hours as needed for nausea or vomiting.   04/09/2016  . Prenatal Vit-Fe Fumarate-FA (PRENATAL MULTIVITAMIN) TABS tablet Take 1 tablet by mouth daily at 12 noon.   04/09/2016  . sertraline (ZOLOFT) 100 MG tablet Take 200 mg by mouth every morning.     04/09/2016   Allergies: is allergic to cefdinir and penicillins. OBHx:  OB History  Gravida Para Term Preterm AB Living  '2 1   1   1  ' SAB TAB Ectopic Multiple Live Births          1    # Outcome Date GA Lbr Len/2nd Weight Sex Delivery Anes PTL Lv  2 Current           1 Preterm 07/09/06    M CS-LTranv   LIV     Complications: Preeclampsia     FJQB:HALPFXTK/WIOXBDZHGDJMexcept as detailed in HPI. Soc Hx: Never smoker, Alcohol: none and Recreational drug use: none  Objective:  There were no vitals filed for this visit. General: Well nourished, well developed female in no acute distress.  Skin: Warm and dry.  Cardiovascular:Regular rate and rhythm. Respiratory: Clear to auscultation bilateral. Normal respiratory effort Abdomen: Nio abd pain. Gravid and Vtx BACK- No CVAT, mild RIGHT low back pain. Neuro/Psych: Normal mood and affect.   LABS- Results for HSTACEE, EARP(MRN [id number] as of 04/09/2016 10:01  Ref. Range 04/09/2016 08:18  Sodium Latest Ref Range: 135 - 145 mmol/L 136  Potassium Latest Ref Range: 3.5 - 5.1 mmol/L 3.5  Chloride Latest Ref Range: 101 - 111 mmol/L 107  CO2 Latest Ref Range: 22 - 32 mmol/L 24  BUN Latest Ref Range: 6 - 20 mg/dL 7  Creatinine Latest Ref Range: 0.44 - 1.00 mg/dL 0.46  Calcium Latest Ref Range: 8.9 - 10.3 mg/dL 8.6 (  L)  EGFR (Non-African Amer.) Latest Ref Range: >60 mL/min >60  EGFR (African American) Latest Ref Range: >60 mL/min >60  Glucose Latest Ref Range: 65 - 99 mg/dL 78  Anion gap Latest Ref Range: 5 - 15  5  Alkaline Phosphatase Latest Ref Range: 38 - 126 U/L 93  Albumin Latest Ref Range: 3.5 - 5.0 g/dL 3.2 (L)  AST Latest Ref Range: 15 - 41 U/L 17  ALT Latest Ref Range: 14 - 54 U/L 18  Total Protein Latest Ref Range: 6.5 - 8.1 g/dL 6.2 (L)  Bilirubin, Direct Latest Ref Range: 0.1 - 0.5 mg/dL 0.2  Indirect Bilirubin Latest Ref Range: 0.3 - 0.9 mg/dL 0.3  Total Bilirubin Latest Ref Range: 0.3 - 1.2 mg/dL 0.5  WBC Latest Ref  Range: 3.6 - 11.0 K/uL 9.1  RBC Latest Ref Range: 3.80 - 5.20 MIL/uL 3.83  Hemoglobin Latest Ref Range: 12.0 - 16.0 g/dL 12.2  HCT Latest Ref Range: 35.0 - 47.0 % 35.4  MCV Latest Ref Range: 80.0 - 100.0 fL 92.4  MCH Latest Ref Range: 26.0 - 34.0 pg 31.7  MCHC Latest Ref Range: 32.0 - 36.0 g/dL 34.3  RDW Latest Ref Range: 11.5 - 14.5 % 12.6  Platelets Latest Ref Range: 150 - 440 K/uL 342  Appearance Latest Ref Range: CLEAR  HAZY (A)  Bacteria, UA Latest Ref Range: NONE SEEN  NONE SEEN  Bilirubin Urine Latest Ref Range: NEGATIVE  NEGATIVE  Ca Oxalate Crys, UA Unknown PRESENT  Color, Urine Latest Ref Range: YELLOW  YELLOW (A)  Glucose Latest Ref Range: NEGATIVE mg/dL NEGATIVE  Hgb urine dipstick Latest Ref Range: NEGATIVE  MODERATE (A)  Ketones, ur Latest Ref Range: NEGATIVE mg/dL NEGATIVE  Leukocytes, UA Latest Ref Range: NEGATIVE  NEGATIVE  Mucous Unknown PRESENT  Nitrite Latest Ref Range: NEGATIVE  NEGATIVE  pH Latest Ref Range: 5.0 - 8.0  5.0  Protein Latest Ref Range: NEGATIVE mg/dL NEGATIVE  RBC / HPF Latest Ref Range: 0 - 5 RBC/hpf TOO NUMEROUS TO C...  Specific Gravity, Urine Latest Ref Range: 1.005 - 1.030  1.017  Squamous Epithelial / LPF Latest Ref Range: NONE SEEN  0-5 (A)  WBC, UA Latest Ref Range: 0 - 5 WBC/hpf 0-5      Perinatal info:  Blood type: O positive Rubella- Immune Varicella -Immune TDaP Given during third trimester of this pregnancy RPR NR / HIV Neg/ HBsAg Neg   Assessment & Plan:   30 y.o. [id number] @ [redacted]w[redacted]d Admitted on 04/09/2016: Hematuria and Right flank pain.  H/o hydronephrosis. Counseled as to options for management. Labs and UA reassuring for kidney function and hydration status. Monitor at home with rest, hydration.  Appt tomorrow. Plans CS No s/sx preeclampsia

## 2016-04-09 NOTE — ED Provider Notes (Signed)
[facility] Emergency Department Provider Note  ____________________________________________   I have reviewed the triage vital signs and the nursing notes.   HISTORY  Chief Complaint Flank Pain    HPI Maria Fuller is a 30 y.o. female G2 P1 remote history of gastric bypass, history of PCO S, depression, and right-sided hydronephrosis in the past,who had a C-section at 34 weeks with her last pregnancy secondary to preeclampsia, presents today complaining of back pain which is been there since yesterday. She also had a trace of blood in her urine. No gush of fluid or vaginal bleeding. She is adamant that it was in her urine. In addition, she has a low back pressure which seems to calm and go. Patient does not have any urge to push and has not had any significant uterine pain. Denies history of kidney stones.      Past Medical History:  Diagnosis Date  . Depression   . Diabetes mellitus without complication (HCC)   . PCOS (polycystic ovarian syndrome)   . Preterm labor     Patient Active Problem List   Diagnosis Date Noted  . Labor and delivery indication for care or intervention 02/26/2016  . Abdominal pain in pregnancy 02/10/2016  . Left flank pain 12/11/2015  . 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 Hx of preeclampsia, prior pregnancy, currently pregnant 10/18/2015  . Previous cesarean delivery, antepartum condition or complication 10/18/2015  . Depression affecting pregnancy in first trimester, antepartum 10/18/2015  . Supervision of high risk pregnancy in first trimester 10/06/2015  . Right flank pain 10/06/2015  . Pyelonephritis affecting pregnancy in first trimester 10/06/2015  . Pyelonephritis complicating pregnancy 10/06/2015    Past Surgical History:  Procedure Laterality Date  . CESAREAN SECTION    . CHOLECYSTECTOMY    . GASTRIC BYPASS  04/2015  . TONSILLECTOMY      Prior to  Admission medications   Medication Sig Start Date End Date Taking? Authorizing Provider  aspirin 81 MG tablet Take 81 mg by mouth daily.    Historical Provider, MD  ondansetron (ZOFRAN) 8 MG tablet Take 8 mg by mouth every 8 (eight) hours as needed for nausea or vomiting.    Historical Provider, MD  Prenatal Vit-Fe Fumarate-FA (PRENATAL MULTIVITAMIN) TABS tablet Take 1 tablet by mouth daily at 12 noon.    Historical Provider, MD  sertraline (ZOLOFT) 100 MG tablet Take 200 mg by mouth every morning.     Historical Provider, MD    Allergies Cefdinir and Penicillins  Family History  Problem Relation Age of Onset  . Polycystic ovary syndrome Mother   . Diabetes Father   . Endometriosis Sister   . Polycystic ovary syndrome Sister   . Breast cancer Paternal Grandmother   . Diabetes Paternal Grandmother   . Diabetes Paternal Grandfather     Social History Social History  Substance Use Topics  . Smoking status: Never Smoker  . Smokeless tobacco: Never Used  . Alcohol use No    Review of Systems Constitutional: No fever/chills Eyes: No visual changes. ENT: No sore throat. No stiff neck no neck pain Cardiovascular: Denies chest pain. Respiratory: Denies shortness of breath. Gastrointestinal:   no vomiting.  No diarrhea.  No constipation. Genitourinary: Negative for dysuria. Musculoskeletal: Negative lower extremity swelling Skin: Negative for rash. Neurological: Negative for severe headaches, focal weakness or numbness. 10-point ROS otherwise negative.  ____________________________________________   PHYSICAL EXAM:  VITAL SIGNS: ED Triage Vitals  Enc  Vitals Group     BP 04/09/16 0810 120/64     Pulse Rate 04/09/16 0810 (!) 59     Resp 04/09/16 0810 18     Temp 04/09/16 0810 97.5 F (36.4 C)     Temp Source 04/09/16 0810 Oral     SpO2 04/09/16 0810 100 %     Weight 04/09/16 0810 260 lb (117.9 kg)     Height 04/09/16 0810 6\' 1"  (1.854 m)     Head Circumference --       Peak Flow --      Pain Score 04/09/16 0814 7     Pain Loc --      Pain Edu? --      Excl. in GC? --     Constitutional: Alert and oriented. Well appearing and in no acute distress. Eyes: Conjunctivae are normal. PERRL. EOMI. Head: Atraumatic. Nose: No congestion/rhinnorhea. Mouth/Throat: Mucous membranes are moist.  Oropharynx non-erythematous. Neck: No stridor.   Nontender with no meningismus Cardiovascular: Normal rate, regular rhythm. Grossly normal heart sounds.  Good peripheral circulation. Respiratory: Normal respiratory effort.  No retractions. Lungs CTAB. Abdominal: Soft and nontender. Patient does have a gravid uterus palpated above the umbilicus.. No guarding no rebound Back:  There is no focal tenderness or step off.  there is no midline tenderness there are no lesions noted. there is mild right CVA tenderness Musculoskeletal: No lower extremity tenderness, no upper extremity tenderness. No joint effusions, no DVT signs strong distal pulses no edema Neurologic:  Normal speech and language. No gross focal neurologic deficits are appreciated.  Skin:  Skin is warm, dry and intact. No rash noted. Psychiatric: Mood and affect are normal. Speech and behavior are normal.  ____________________________________________   LABS (all labs ordered are listed, but only abnormal results are displayed)  Labs Reviewed  BASIC METABOLIC PANEL - Abnormal; Notable for the following:       Result Value   Calcium 8.6 (*)    All other components within normal limits  HEPATIC FUNCTION PANEL - Abnormal; Notable for the following:    Total Protein 6.2 (*)    Albumin 3.2 (*)    All other components within normal limits  URINE CULTURE  CBC  URINALYSIS, COMPLETE (UACMP) WITH MICROSCOPIC   ____________________________________________  EKG  I personally interpreted any EKGs ordered by me or triage  ____________________________________________  RADIOLOGY  I reviewed any imaging ordered by  me or triage that were performed during my shift and, if possible, patient and/or family made aware of any abnormal findings. ____________________________________________   PROCEDURES  Procedure(s) performed: None  Procedures  Critical Care performed: None  ____________________________________________   INITIAL IMPRESSION / ASSESSMENT AND PLAN / ED COURSE  Pertinent labs & imaging results that were available during my care of the patient were reviewed by me and considered in my medical decision making (see chart for details).  Discussed with Dr. Annamarie Major, patient likely having urinary symptoms however, given her history of C-section, early delivery in the past, they did want her brought him immediately to the OB/GYN area for monitoring. Dr. Tiburcio Pea will continue working up her urine up there. Results are pending. He will follow up on urinalysis and etc. Abdomen is benign. At this time and does not appear to be any evidence of appendicitis or gallbladder disease or diverticulitis etc. Given that the patient has had no evidence of impending labor here, we did defer pelvic exam to she went upstairs. This was discussed with OB  and they are in agreement with this plan. Patient would prefer to be upstairs at this time. Vital signs are reassuring. She'll be technically discharged from the emergency room but we will escorted directly to wear her OBs are to ensure that this is not a unusual labor presentation.  Clinical Course    ____________________________________________   FINAL CLINICAL IMPRESSION(S) / ED DIAGNOSES  Final diagnoses:  Flank pain      This chart was dictated using voice recognition software.  Despite best efforts to proofread,  errors can occur which can change meaning.      Jeanmarie PlantJames A McShane, MD 04/09/16 [id number]

## 2016-04-09 NOTE — ED Notes (Signed)
edp notified this RN pt will be discharged to L&D.

## 2016-04-09 NOTE — Discharge Summary (Signed)
Pt d/c'd to home by self. Transported by volunteer to visitor's entrance. Given preterm labor and preeclampsia precautions. Verbalized understanding.

## 2016-04-09 NOTE — ED Notes (Signed)
Pt is [redacted] wks gestation with her second pregnany c/o low right side back pain started last night with some blood in her urine. Denies any pregnancy issues.

## 2016-04-09 NOTE — ED Triage Notes (Signed)
Pt presents with right flank pain since yesterday. Pt states she has hx UTI. Last UTI was in August, was admitted for kidney infection. Pt states she has pain in right flank and lower abdomen. Pt alert & oriented with NAD noted.

## 2016-04-09 NOTE — ED Triage Notes (Signed)
Pt is [redacted] weeks pregnant at this time; edc 05/17/2016

## 2016-04-09 NOTE — Progress Notes (Signed)
[facility] LAB[facility] AND DELIVERY [id number] [place] [address] Phone: [phone] Fax: [phone]  April 09, 2016  Patient: Maria Fuller  Date of Birth: 1985/06/20  Date of Visit: 04/09/2016    To Whom It May Concern:  Maria Fuller was seen and treated in our [facility] on 12/6/2017and needs to be excused from work for the next 2 days due to her pregnancy risk factors. Maria Fuller  may return to work on 04/11/2016.  Sincerely,  Annamarie MajorPaul Paiden Caraveo, MD [facility]

## 2016-04-09 NOTE — ED Notes (Signed)
Notified lab of add on. 

## 2016-04-09 NOTE — Discharge Instructions (Signed)
Go directly to OB obs upstairs.  We will escort you.

## 2016-04-09 NOTE — Discharge Summary (Signed)
Physician Discharge Summary  Patient ID: Maria Fuller MRN: [id number] DOB/AGE: [birth year] 31 y.o.  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  Seen and examined.  Stable for outpatient management.  Plan office visit tomorrow.  Monitor pain and hematuria.  Known hydronephrosis, urology consult if worsens.  Planning CS at 39 weeks.    Significant Diagnostic Studies: A NST procedure was performed with FHR monitoring and a normal baseline established, appropriate time of 20-40 minutes of evaluation, and accels >2 seen w 15x15 characteristics.  Results show a REACTIVE NST.   Discharge Exam: Last menstrual period 08/11/2015. no changes, see H&P  Disposition: 01-Home or Self Care     Medication List    TAKE these medications   aspirin 81 MG tablet Take 81 mg by mouth daily.   ondansetron 8 MG tablet Commonly known as:  ZOFRAN Take 8 mg by mouth every 8 (eight) hours as needed for nausea or vomiting.   prenatal multivitamin Tabs tablet Take 1 tablet by mouth daily at 12 noon.   sertraline 100 MG tablet Commonly known as:  ZOLOFT Take 200 mg by mouth every morning.        Signed: Letitia Libraobert Paul Fuller 04/09/2016, 10:41 AM

## 2016-04-10 ENCOUNTER — Emergency Department
Admission: EM | Admit: 2016-04-10 | Discharge: 2016-04-10 | Disposition: A | Discharge status: Home / Self Care | Payer: BC Managed Care – PPO | Attending: Student in an Organized Health Care Education/Training Program | Admitting: Student in an Organized Health Care Education/Training Program

## 2016-04-10 ENCOUNTER — Encounter: Payer: Self-pay | Admitting: Emergency Medicine

## 2016-04-10 ENCOUNTER — Emergency Department: Payer: BC Managed Care – PPO

## 2016-04-10 DIAGNOSIS — Z7982 Long term (current) use of aspirin: Secondary | ICD-10-CM | POA: Diagnosis not present

## 2016-04-10 DIAGNOSIS — O26893 Other specified pregnancy related conditions, third trimester: Secondary | ICD-10-CM | POA: Insufficient documentation

## 2016-04-10 DIAGNOSIS — R10823 Right lower quadrant rebound abdominal tenderness: Secondary | ICD-10-CM | POA: Insufficient documentation

## 2016-04-10 DIAGNOSIS — Z3A34 34 weeks gestation of pregnancy: Secondary | ICD-10-CM | POA: Diagnosis not present

## 2016-04-10 DIAGNOSIS — E119 Type 2 diabetes mellitus without complications: Secondary | ICD-10-CM | POA: Insufficient documentation

## 2016-04-10 DIAGNOSIS — Z79899 Other long term (current) drug therapy: Secondary | ICD-10-CM | POA: Diagnosis not present

## 2016-04-10 DIAGNOSIS — R109 Unspecified abdominal pain: Secondary | ICD-10-CM

## 2016-04-10 DIAGNOSIS — R319 Hematuria, unspecified: Secondary | ICD-10-CM | POA: Diagnosis not present

## 2016-04-10 LAB — URINALYSIS, COMPLETE (UACMP) WITH MICROSCOPIC
Bilirubin Urine: NEGATIVE
GLUCOSE, UA: NEGATIVE mg/dL
HGB URINE DIPSTICK: NEGATIVE
Ketones, ur: NEGATIVE mg/dL
LEUKOCYTES UA: NEGATIVE
NITRITE: NEGATIVE
Protein, ur: NEGATIVE mg/dL
SPECIFIC GRAVITY, URINE: 1.018 (ref 1.005–1.030)
pH: 6 (ref 5.0–8.0)

## 2016-04-10 LAB — URINE CULTURE

## 2016-04-10 MED ORDER — ACETAMINOPHEN 500 MG PO TABS
1000.0000 mg | ORAL_TABLET | Freq: Once | ORAL | Status: AC
  Administered 2016-04-10: 1000 mg via ORAL
  Filled 2016-04-10: qty 2

## 2016-04-10 MED ORDER — FOSFOMYCIN TROMETHAMINE 3 G PO PACK: 3.0000 g | PACK | Freq: Once | ORAL | 0 refills | Status: AC

## 2016-04-10 MED ORDER — METOCLOPRAMIDE HCL 5 MG/ML IJ SOLN
10.0000 mg | Freq: Once | INTRAMUSCULAR | Status: AC
Start: 2016-04-10 — End: 2016-04-10
  Administered 2016-04-10: 10 mg via INTRAVENOUS
  Filled 2016-04-10: qty 2

## 2016-04-10 NOTE — ED Notes (Signed)
Pt in ultrasound

## 2016-04-10 NOTE — ED Notes (Signed)
This RN called L&D to check if patient needed to be transferred upstairs.  They spoke to the on call ob-gyn and states that patient needs to be evaluated for hydronephrosis by ED physician and then can get a fetal check up in L&D after ED evaluation.

## 2016-04-10 NOTE — Discharge Instructions (Signed)
Return to the ER should he have any worsening fever, pain and inability to tolerate oral medications. Please follow-up with OB/GYN. Follow up with urology.

## 2016-04-10 NOTE — ED Triage Notes (Signed)
Patient presents to the ED with right flank pain since yesterday with hematuria.  Patient reports history of hydronephrosis and states she was seen in the ED with the same yesterday.  Patient reports that she is [redacted] weeks pregnant and was instructed to come back to the ED if pain did not improve or worsened.

## 2016-04-10 NOTE — ED Provider Notes (Signed)
[facility] Emergency Department Provider Note    First MD Initiated Contact with Patient 04/10/16 1148     (approximate)  I have reviewed the triage vital signs and the nursing notes.   HISTORY  Chief Complaint Flank Pain    HPI Maria Fuller is a 30 y.o. female who is G2 P1 roughly [redacted] weeks pregnant presents with persistent right flank pain with intermittent hematuria and crampy abdominal pain. She denies any vaginal discharge or bleeding. States that pain has not changed in nature since yesterday's evaluation here in the ER. She went up to L&D and was offered admission but declined and chose to go home. Since going home she's had worsening nausea and pain. No fevers. No chest pain or shortness of breath. Still feels the baby kicking. Is not feeling any contractions at this time.   Past Medical History:  Diagnosis Date  . Depression   . Diabetes mellitus without complication (HCC)   . PCOS (polycystic ovarian syndrome)   . Preterm labor    Family History  Problem Relation Age of Onset  . Polycystic ovary syndrome Mother   . Diabetes Father   . Endometriosis Sister   . Polycystic ovary syndrome Sister   . Breast cancer Paternal Grandmother   . Diabetes Paternal Grandmother   . Diabetes Paternal Grandfather    Past Surgical History:  Procedure Laterality Date  . CESAREAN SECTION    . CHOLECYSTECTOMY    . GASTRIC BYPASS  04/2015  . TONSILLECTOMY     Patient Active Problem List   Diagnosis Date Noted  . Labor and delivery indication for care or intervention 02/26/2016  . Abdominal pain in pregnancy 02/10/2016  . Left flank pain 12/11/2015  . 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 Hx of preeclampsia, prior pregnancy, currently pregnant 10/18/2015  . Previous cesarean delivery, antepartum condition or complication 10/18/2015  . Depression affecting pregnancy in first trimester, antepartum  10/18/2015  . Supervision of high risk pregnancy in first trimester 10/06/2015  . Right flank pain 10/06/2015  . Pyelonephritis affecting pregnancy in first trimester 10/06/2015  . Pyelonephritis complicating pregnancy 10/06/2015      Prior to Admission medications   Medication Sig Start Date End Date Taking? Authorizing Provider  aspirin 81 MG tablet Take 81 mg by mouth daily.    Historical Provider, MD  ondansetron (ZOFRAN) 8 MG tablet Take 8 mg by mouth every 8 (eight) hours as needed for nausea or vomiting.    Historical Provider, MD  Prenatal Vit-Fe Fumarate-FA (PRENATAL MULTIVITAMIN) TABS tablet Take 1 tablet by mouth daily at 12 noon.    Historical Provider, MD  sertraline (ZOLOFT) 100 MG tablet Take 200 mg by mouth every morning.     Historical Provider, MD    Allergies Cefdinir and Penicillins    Social History Social History  Substance Use Topics  . Smoking status: Never Smoker  . Smokeless tobacco: Never Used  . Alcohol use No    Review of Systems Patient denies headaches, rhinorrhea, blurry vision, numbness, shortness of breath, chest pain, edema, cough, abdominal pain, nausea, vomiting, diarrhea, dysuria, fevers, rashes or hallucinations unless otherwise stated above in HPI. ____________________________________________   PHYSICAL EXAM:  VITAL SIGNS: Vitals:   04/10/16 1100  BP: 136/64  Pulse: 60  Resp: 18  Temp: 98 F (36.7 C)    Constitutional: Alert and oriented. Well appearing and in no acute distress. Eyes: Conjunctivae are normal. PERRL. EOMI. Head:  Atraumatic. Nose: No congestion/rhinnorhea. Mouth/Throat: Mucous membranes are moist.  Oropharynx non-erythematous. Neck: No stridor. Painless ROM. No cervical spine tenderness to palpation Hematological/Lymphatic/Immunilogical: No cervical lymphadenopathy. Cardiovascular: Normal rate, regular rhythm. Grossly normal heart sounds.  Good peripheral circulation. Respiratory: Normal respiratory effort.   No retractions. Lungs CTAB. Gastrointestinal: Soft and nontender. No distention. No abdominal bruits. + right CVA ttp Genitourinary: deferred Musculoskeletal: No lower extremity tenderness nor edema.  No joint effusions. Neurologic:  Normal speech and language. No gross focal neurologic deficits are appreciated. No gait instability. Skin:  Skin is warm, dry and intact. No rash noted. Psychiatric: Mood and affect are normal. Speech and behavior are normal.  ____________________________________________   LABS (all labs ordered are listed, but only abnormal results are displayed)  Results for orders placed or performed during the hospital encounter of 04/10/16 (from the past 24 hour(s))  Urinalysis, Complete w Microscopic     Status: Abnormal   Collection Time: 04/10/16 11:55 AM  Result Value Ref Range   Color, Urine YELLOW (A) YELLOW   APPearance CLEAR (A) CLEAR   Specific Gravity, Urine 1.018 1.005 - 1.030   pH 6.0 5.0 - 8.0   Glucose, UA NEGATIVE NEGATIVE mg/dL   Hgb urine dipstick NEGATIVE NEGATIVE   Bilirubin Urine NEGATIVE NEGATIVE   Ketones, ur NEGATIVE NEGATIVE mg/dL   Protein, ur NEGATIVE NEGATIVE mg/dL   Nitrite NEGATIVE NEGATIVE   Leukocytes, UA NEGATIVE NEGATIVE   RBC / HPF 0-5 0 - 5 RBC/hpf   WBC, UA 0-5 0 - 5 WBC/hpf   Bacteria, UA RARE (A) NONE SEEN   Squamous Epithelial / LPF 0-5 (A) NONE SEEN   Mucous PRESENT    Ca Oxalate Crys, UA PRESENT    ____________________________________________  EKG_________________________________  RADIOLOGY  I personally reviewed all radiographic images ordered to evaluate for the above acute complaints and reviewed radiology reports and findings.  These findings were personally discussed with the patient.  Please see medical record for radiology report.  ____________________________________________   PROCEDURES  Procedure(s) performed: none Procedures    Critical Care performed:  no ____________________________________________   INITIAL IMPRESSION / ASSESSMENT AND PLAN / ED COURSE  Pertinent labs & imaging results that were available during my care of the patient were reviewed by me and considered in my medical decision making (see chart for details).  DDX: uti, stone, pyelo, msk strain,   Maria Fuller is a 30 y.o. who presents to the ED with right flank pain since yesterday with intermittent hematuria. Patient presented to the ER for similar but worsening symptoms. Patient afebrile hemodynamic stable. Given her right CVA tenderness to palpation will repeat ultrasound to evaluate for any evidence of hydronephrosis. Do not feel lab testing clinically indicated. We'll provide symptomatic treatment.  Clinical Course as of Apr 10 1530  Thu Apr 10, 2016  1315 No evidence of blood on UA.  US kidney pending.  [PR]  1356 Renal ultrasound with no evidence of hydronephrosis on the right. Urine without any infection. Patient with no pain at this time. We'll touch base with L&D for further monitoring.  [PR]  1400 I spoke with Dr. Chauncey Cruel of OB/GYN. Patient was supposed to follow-up in outpatient clinic. She had reassuring strip yesterday does not feel patient requires L&D evaluation at this time. Patient stable for further outpatient management and follow-up with urology.  [PR]    Clinical Course User Index [PR] Willy Eddy, MD     ____________________________________________   FINAL CLINICAL IMPRESSION(S) / ED DIAGNOSES  Final diagnoses:  Right flank pain  [redacted] weeks gestation of pregnancy      NEW MEDICATIONS STARTED DURING THIS VISIT:  New Prescriptions   No medications on file     Note:  This document was prepared using Dragon voice recognition software and may include unintentional dictation errors.    Willy EddyPatrick Fines Kimberlin, MD 04/10/16 [phone]

## 2016-04-11 ENCOUNTER — Ambulatory Visit: Payer: BC Managed Care – PPO | Admitting: Urology

## 2016-04-11 ENCOUNTER — Encounter: Payer: Self-pay | Admitting: Urology

## 2016-04-11 VITALS — BP 99/58 | HR 64 | Ht 73.0 in | Wt 265.2 lb

## 2016-04-11 DIAGNOSIS — N200 Calculus of kidney: Secondary | ICD-10-CM | POA: Diagnosis not present

## 2016-04-11 LAB — URINALYSIS, COMPLETE
BILIRUBIN UA: NEGATIVE
Glucose, UA: NEGATIVE
Ketones, UA: NEGATIVE
LEUKOCYTES UA: NEGATIVE
Nitrite, UA: NEGATIVE
PH UA: 5.5 (ref 5.0–7.5)
PROTEIN UA: NEGATIVE
RBC UA: NEGATIVE
SPEC GRAV UA: 1.025 (ref 1.005–1.030)
Urobilinogen, Ur: 0.2 mg/dL (ref 0.2–1.0)

## 2016-04-11 LAB — MICROSCOPIC EXAMINATION

## 2016-04-11 NOTE — Progress Notes (Signed)
04/11/2016 10:06 AM   Maria Fuller 01/06/1986 [id number]  [facility] [address] [place], [place] [id number]  Chief Complaint  Patient presents with  . New Patient (Initial Visit)    Renal stones     HPI: We were consult to assess the patient's right kidney stone. She is [redacted] weeks pregnant. She is prone to bladder infections. Since Tuesday she's been having right flank discomfort associated with some nausea. She noted some blood in the urine. She went to the emergency room yesterday. On December 6 the urine culture was negative. The urinalysis was negative. She had a renal ultrasound demonstrating a 5 mm stone in the midpole of the right kidney versus a blood vessel artifact. She had no right-sided hydronephrosis. She had mild left hydronephrosis. She was given fosfomycin but she often gets nausea with it and therefore she was switched a macro [place] yesterday by her gynecologist  She is voiding approximately every 1 hour throughout her pregnancy.    August 8 an ultrasound demonstrated mild hydronephrosis on the left.  I reviewed the ultrasound from yesterday and there was no shadowing related to the possible calcification in the right kidney. If she does have mild hydronephrosis on the left it certainly is mild and unrelated.  Her pain is in the right flank and radiates to the right iliac crest  Modifying factors: There are no other modifying factors  Associated signs and symptoms: There are no other associated signs and symptoms Aggravating and relieving factors: There are no other aggravating or relieving factors Severity: Moderate Duration: Persistent   PMH: Past Medical History:  Diagnosis Date  . Depression   . Diabetes mellitus without complication (HCC)   . PCOS (polycystic ovarian syndrome)   . Preterm labor     Surgical History: Past Surgical History:  Procedure Laterality Date  . CESAREAN SECTION      . CHOLECYSTECTOMY    . GASTRIC BYPASS  04/2015  . TONSILLECTOMY      Home Medications:    Medication List       Accurate as of 04/11/16 10:06 AM. Always use your most recent med list.          aspirin 81 MG tablet Take 81 mg by mouth daily.   nitrofurantoin (macrocrystal-monohydrate) 100 MG capsule Commonly known as:  MACROBID Take 100 mg by mouth 2 (two) times daily.   ondansetron 8 MG disintegrating tablet Commonly known as:  ZOFRAN-ODT Take 8 mg by mouth every 6 (six) hours as needed for nausea or vomiting.   prenatal multivitamin Tabs tablet Take 1 tablet by mouth daily at 12 noon.   sertraline 100 MG tablet Commonly known as:  ZOLOFT Take 200 mg by mouth daily.       Allergies:  Allergies  Allergen Reactions  . Cefdinir Rash  . Penicillins Rash    Has patient had a PCN reaction causing immediate rash, facial/tongue/throat swelling, SOB or lightheadedness with hypotension: Yes Has patient had a PCN reaction causing severe rash involving mucus membranes or skin necrosis: No Has patient had a PCN reaction that required hospitalization No Has patient had a PCN reaction occurring within the last 10 years: No If all of the above answers are "NO", then may proceed with Cephalosporin use.    Family History: Family History  Problem Relation Age of Onset  . Polycystic ovary syndrome Mother   . Diabetes Father   . Endometriosis Sister   . Polycystic  ovary syndrome Sister   . Breast cancer Paternal Grandmother   . Diabetes Paternal Grandmother   . Diabetes Paternal Grandfather     Social History:  reports that she has never smoked. She has never used smokeless tobacco. She reports that she does not drink alcohol or use drugs.  ROS: UROLOGY Frequent Urination?: Yes Hard to postpone urination?: No Burning/pain with urination?: No Get up at night to urinate?: Yes Leakage of urine?: No Urine stream starts and stops?: No Trouble starting stream?: No Do you  have to strain to urinate?: No Blood in urine?: Yes Urinary tract infection?: No Sexually transmitted disease?: No Injury to kidneys or bladder?: No Painful intercourse?: No Weak stream?: No Currently pregnant?: No Vaginal bleeding?: No Last menstrual period?: No  Gastrointestinal Nausea?: Yes Vomiting?: No Indigestion/heartburn?: No Diarrhea?: No Constipation?: No  Constitutional Fever: No Night sweats?: No Weight loss?: Yes Fatigue?: Yes  Skin Skin rash/lesions?: No Itching?: No  Eyes Blurred vision?: No Double vision?: No  Ears/Nose/Throat Sore throat?: No Sinus problems?: No  Hematologic/Lymphatic Swollen glands?: No Easy bruising?: No  Cardiovascular Leg swelling?: Yes Chest pain?: No  Respiratory Cough?: No Shortness of breath?: No  Endocrine Excessive thirst?: No  Musculoskeletal Back pain?: Yes Joint pain?: No  Neurological Headaches?: No Dizziness?: No  Psychologic Depression?: Yes Anxiety?: Yes  Physical Exam: BP (!) 99/58   Pulse 64   Ht 6\' 1"  (1.854 m)   Wt 265 lb 3.2 oz (120.3 kg)   LMP 08/11/2015 (Exact Date)   BMI 34.99 kg/m   Constitutional:  Alert and oriented, No acute distress. HEENT: [place] AT, moist mucus membranes.  Trachea midline, no masses. Cardiovascular: No clubbing, cyanosis, or edema. Respiratory: Normal respiratory effort, no increased work of breathing. GI: Abdomen is soft, nontender, nondistended, no abdominal masses GU: Mild Rt CVA tenderness.  Skin: No rashes, bruises or suspicious lesions. Lymph: No cervical or inguinal adenopathy. Neurologic: Grossly intact, no focal deficits, moving all 4 extremities. Psychiatric: Normal mood and affect.  Laboratory Data: Lab Results  Component Value Date   WBC 9.1 04/09/2016   HGB 12.2 04/09/2016   HCT 35.4 04/09/2016   MCV 92.4 04/09/2016   PLT 342 04/09/2016    Lab Results  Component Value Date   CREATININE 0.46 04/09/2016    No results found for:  PSA  No results found for: TESTOSTERONE  Lab Results  Component Value Date   HGBA1C 4.9 10/06/2015    Urinalysis    Component Value Date/Time   COLORURINE YELLOW (A) 04/10/2016 1155   APPEARANCEUR CLEAR (A) 04/10/2016 1155   APPEARANCEUR Hazy 02/08/2013 0027   LABSPEC 1.018 04/10/2016 1155   LABSPEC 1.033 02/08/2013 0027   PHURINE 6.0 04/10/2016 1155   GLUCOSEU NEGATIVE 04/10/2016 1155   GLUCOSEU >=500 02/08/2013 0027   HGBUR NEGATIVE 04/10/2016 1155   BILIRUBINUR NEGATIVE 04/10/2016 1155   BILIRUBINUR Negative 02/08/2013 0027   KETONESUR NEGATIVE 04/10/2016 1155   PROTEINUR NEGATIVE 04/10/2016 1155   UROBILINOGEN 1.0 06/26/2014 1405   NITRITE NEGATIVE 04/10/2016 1155   LEUKOCYTESUR NEGATIVE 04/10/2016 1155   LEUKOCYTESUR Negative 02/08/2013 0027    Pertinent Imaging: See above  Assessment & Plan:  The patient has right sided pain with no hydronephrosis. She currently is on Macrobid. Her recent urine culture is negative. She might have a nonobstructing stone on the right side versus artifact. A picture was drawn. Based upon the ultrasound the pain is not related to the possible stone. Her mild hydronephrosis on the left is  clinically not related and has been reported previous in August.  The patient should be continued to be followed carefully with urine cultures and repeat x-rays if needed.  Reassurance given. I will see the patient when necessary  1. Nephrolithiasis 2. Urinary frequency - Urinalysis, Complete - CULTURE, URINE COMPREHENSIVE   No Follow-up on file.  Martina SinnerMACDIARMID,Kostas Marrow A, MD  [facility] [address], [address] [place], [place] [address] [phone]

## 2016-04-16 LAB — CULTURE, URINE COMPREHENSIVE

## 2016-04-17 ENCOUNTER — Observation Stay
Admission: EM | Admit: 2016-04-17 | Discharge: 2016-04-17 | Disposition: A | Discharge status: Home / Self Care | Payer: BC Managed Care – PPO | Attending: Obstetrics and Gynecology | Admitting: Obstetrics and Gynecology

## 2016-04-17 ENCOUNTER — Encounter: Payer: Self-pay | Admitting: *Deleted

## 2016-04-17 DIAGNOSIS — Z888 Allergy status to other drugs, medicaments and biological substances status: Secondary | ICD-10-CM | POA: Diagnosis not present

## 2016-04-17 DIAGNOSIS — Z7982 Long term (current) use of aspirin: Secondary | ICD-10-CM | POA: Insufficient documentation

## 2016-04-17 DIAGNOSIS — O9989 Other specified diseases and conditions complicating pregnancy, childbirth and the puerperium: Secondary | ICD-10-CM

## 2016-04-17 DIAGNOSIS — F32A Depression, unspecified: Secondary | ICD-10-CM

## 2016-04-17 DIAGNOSIS — F329 Major depressive disorder, single episode, unspecified: Secondary | ICD-10-CM | POA: Insufficient documentation

## 2016-04-17 DIAGNOSIS — Z880 Allergy status to penicillin: Secondary | ICD-10-CM | POA: Insufficient documentation

## 2016-04-17 DIAGNOSIS — O09299 Supervision of pregnancy with other poor reproductive or obstetric history, unspecified trimester: Secondary | ICD-10-CM

## 2016-04-17 DIAGNOSIS — O99843 Bariatric surgery status complicating pregnancy, third trimester: Secondary | ICD-10-CM | POA: Insufficient documentation

## 2016-04-17 DIAGNOSIS — Z842 Family history of other diseases of the genitourinary system: Secondary | ICD-10-CM | POA: Insufficient documentation

## 2016-04-17 DIAGNOSIS — Z9049 Acquired absence of other specified parts of digestive tract: Secondary | ICD-10-CM | POA: Diagnosis not present

## 2016-04-17 DIAGNOSIS — O99343 Other mental disorders complicating pregnancy, third trimester: Secondary | ICD-10-CM | POA: Insufficient documentation

## 2016-04-17 DIAGNOSIS — Z79899 Other long term (current) drug therapy: Secondary | ICD-10-CM | POA: Diagnosis not present

## 2016-04-17 DIAGNOSIS — O26893 Other specified pregnancy related conditions, third trimester: Principal | ICD-10-CM | POA: Insufficient documentation

## 2016-04-17 DIAGNOSIS — O99841 Bariatric surgery status complicating pregnancy, first trimester: Secondary | ICD-10-CM

## 2016-04-17 DIAGNOSIS — Z3A35 35 weeks gestation of pregnancy: Secondary | ICD-10-CM | POA: Insufficient documentation

## 2016-04-17 DIAGNOSIS — Z8661 Personal history of infections of the central nervous system: Secondary | ICD-10-CM | POA: Insufficient documentation

## 2016-04-17 DIAGNOSIS — O2303 Infections of kidney in pregnancy, third trimester: Secondary | ICD-10-CM | POA: Diagnosis not present

## 2016-04-17 DIAGNOSIS — O99341 Other mental disorders complicating pregnancy, first trimester: Secondary | ICD-10-CM

## 2016-04-17 DIAGNOSIS — Z833 Family history of diabetes mellitus: Secondary | ICD-10-CM | POA: Insufficient documentation

## 2016-04-17 DIAGNOSIS — Z803 Family history of malignant neoplasm of breast: Secondary | ICD-10-CM | POA: Diagnosis not present

## 2016-04-17 DIAGNOSIS — O99283 Endocrine, nutritional and metabolic diseases complicating pregnancy, third trimester: Secondary | ICD-10-CM | POA: Insufficient documentation

## 2016-04-17 DIAGNOSIS — M549 Dorsalgia, unspecified: Secondary | ICD-10-CM | POA: Diagnosis present

## 2016-04-17 DIAGNOSIS — E119 Type 2 diabetes mellitus without complications: Secondary | ICD-10-CM | POA: Insufficient documentation

## 2016-04-17 DIAGNOSIS — O99891 Other specified diseases and conditions complicating pregnancy: Secondary | ICD-10-CM | POA: Diagnosis present

## 2016-04-17 HISTORY — DX: Meningitis, unspecified: G03.9

## 2016-04-17 HISTORY — DX: Headache, unspecified: R51.9

## 2016-04-17 HISTORY — DX: Headache: R51

## 2016-04-17 HISTORY — DX: Essential (primary) hypertension: I10

## 2016-04-17 NOTE — Discharge Instructions (Signed)
Discharge instructions reviewed with patient, also discussed labor precautions, lower back pain in pregnancy, true/false labor, and fetal kick count.  Pt. Verbalized understanding of all discharge instructions, copy signed and given.

## 2016-04-17 NOTE — OB Triage Note (Signed)
Pt. Present with complaint of lower back pain (see history of possible kidney stone)  and decreased fetal movement. Denies sudden gush of fluid, or vaginal bleeding. Last sexual encounter was "weeks ago". U/S applied - FHR 140s; Toco applied - abd soft.

## 2016-04-17 NOTE — Discharge Summary (Signed)
Obstetric History and Physical  Maria Fuller is a 30 y.o. G2P0101 with Estimated Date of Delivery: 05/17/16 per LMP & 8 wk US who presents at 3440w5d  presenting for low back pain and low abdominal pain. Patient states she has been having q 15 min contractions, no vaginal bleeding, intact membranes, with decreased fetal movement.    Prenatal Course Source of Care: [facility]  with onset of care at 7 weeks Pregnancy complications or risks: -H/o severe pre-eclampsia and pulmonary hypertension at 34 wks, LTCS -H/o gastric bypass -Kidney infection -Depression  Patient Active Problem List   Diagnosis Date Noted  . Back pain affecting pregnancy 04/17/2016  . Labor and delivery indication for care or intervention 02/26/2016  . Abdominal pain in pregnancy 02/10/2016  . Left flank pain 12/11/2015  . 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 Hx of preeclampsia, prior pregnancy, currently pregnant 10/18/2015  . Previous cesarean delivery, antepartum condition or complication 10/18/2015  . Depression affecting pregnancy in first trimester, antepartum 10/18/2015  . Supervision of high risk pregnancy in first trimester 10/06/2015  . Right flank pain 10/06/2015  . Pyelonephritis affecting pregnancy in first trimester 10/06/2015  . Pyelonephritis complicating pregnancy 10/06/2015     Prenatal Transfer Tool   Past Medical History:  Diagnosis Date  . Depression   . Diabetes mellitus without complication (HCC)    resolved with weight loss from gastric bypass  . Headache    migraines  . Hypertension    resolved since gastric bypass, severe pre-eclampsia with G1  . Meningitis 2015  . PCOS (polycystic ovarian syndrome)   . Preterm labor     Past Surgical History:  Procedure Laterality Date  . CESAREAN SECTION    . CHOLECYSTECTOMY    . GASTRIC BYPASS  04/2015  . TONSILLECTOMY      OB History  Gravida Para Term Preterm AB Living  2 1   1   1    SAB TAB Ectopic Multiple Live Births          1    # Outcome Date GA Lbr Len/2nd Weight Sex Delivery Anes PTL Lv  2 Current           1 Preterm 07/09/06 251w0d  7 lb 3 oz (3.26 kg) M CS-LTranv   LIV     Complications: Preeclampsia      Social History   Social History  . Marital status: Married    Spouse name: N/A  . Number of children: N/A  . Years of education: N/A   Social History Main Topics  . Smoking status: Never Smoker  . Smokeless tobacco: Never Used  . Alcohol use No  . Drug use: No  . Sexual activity: Yes   Other Topics Concern  . None   Social History Narrative  . None    Family History  Problem Relation Age of Onset  . Polycystic ovary syndrome Mother   . Diabetes Father   . Endometriosis Sister   . Polycystic ovary syndrome Sister   . Breast cancer Paternal Grandmother   . Diabetes Paternal Grandmother   . Diabetes Paternal Grandfather     Prescriptions Prior to Admission  Medication Sig Dispense Refill Last Dose  . aspirin 81 MG tablet Take 81 mg by mouth daily.   04/16/2016 at Unknown time  . nitrofurantoin, macrocrystal-monohydrate, (MACROBID) 100 MG capsule Take 100 mg by mouth 2 (two) times daily.   04/16/2016 at Unknown time  . ondansetron (ZOFRAN-ODT)  8 MG disintegrating tablet Take 8 mg by mouth every 6 (six) hours as needed for nausea or vomiting.   Past Week at Unknown time  . Prenatal Vit-Fe Fumarate-FA (PRENATAL MULTIVITAMIN) TABS tablet Take 1 tablet by mouth daily at 12 noon.   04/17/2016 at Unknown time  . sertraline (ZOLOFT) 100 MG tablet Take 200 mg by mouth daily.    04/17/2016 at Unknown time    Allergies  Allergen Reactions  . Cefdinir Rash  . Penicillins Rash    Has patient had a PCN reaction causing immediate rash, facial/tongue/throat swelling, SOB or lightheadedness with hypotension: Yes Has patient had a PCN reaction causing severe rash involving mucus membranes or skin necrosis: No Has patient had a PCN reaction that  required hospitalization No Has patient had a PCN reaction occurring within the last 10 years: No If all of the above answers are "NO", then may proceed with Cephalosporin use.    Review of Systems: Negative except for what is mentioned in HPI.  Physical Exam: LMP 08/11/2015 (Exact Date)  GENERAL: Well-developed, well-nourished female in no acute distress.  LUNGS: unlabored breathing ABDOMEN: Soft, nontender, nondistended, gravid. EXTREMITIES: Nontender, no edema Cervical Exam: Dilatation 0cm   Effacement 50%   Station -1   Presentation: cephalic FHT: Category: 1 Baseline rate 135 bpm   Variability moderate  Accelerations present   Decelerations none Contractions: irritability   Pertinent Labs/Studies:   No results found for this or any previous visit (from the past 24 hour(s)).  Assessment : IUP at 3963w5d, discomforts of pregnancy  Plan: Discharge Home  [facility], growth US ordered prior to NV d/t h/o macrosomia and h/o gastric bypass

## 2016-05-05 ENCOUNTER — Encounter: Payer: Self-pay | Admitting: *Deleted

## 2016-05-05 ENCOUNTER — Observation Stay
Admission: EM | Admit: 2016-05-05 | Discharge: 2016-05-05 | Disposition: A | Discharge status: Home / Self Care | Payer: BC Managed Care – PPO | Source: Home / Self Care | Admitting: Obstetrics and Gynecology

## 2016-05-05 DIAGNOSIS — Z7982 Long term (current) use of aspirin: Secondary | ICD-10-CM | POA: Insufficient documentation

## 2016-05-05 DIAGNOSIS — O99843 Bariatric surgery status complicating pregnancy, third trimester: Secondary | ICD-10-CM

## 2016-05-05 DIAGNOSIS — Z79899 Other long term (current) drug therapy: Secondary | ICD-10-CM | POA: Insufficient documentation

## 2016-05-05 DIAGNOSIS — O26893 Other specified pregnancy related conditions, third trimester: Secondary | ICD-10-CM | POA: Insufficient documentation

## 2016-05-05 DIAGNOSIS — R319 Hematuria, unspecified: Secondary | ICD-10-CM

## 2016-05-05 DIAGNOSIS — F32A Depression, unspecified: Secondary | ICD-10-CM

## 2016-05-05 DIAGNOSIS — F329 Major depressive disorder, single episode, unspecified: Secondary | ICD-10-CM | POA: Insufficient documentation

## 2016-05-05 DIAGNOSIS — O99341 Other mental disorders complicating pregnancy, first trimester: Secondary | ICD-10-CM

## 2016-05-05 DIAGNOSIS — O99343 Other mental disorders complicating pregnancy, third trimester: Secondary | ICD-10-CM | POA: Insufficient documentation

## 2016-05-05 DIAGNOSIS — O99841 Bariatric surgery status complicating pregnancy, first trimester: Secondary | ICD-10-CM

## 2016-05-05 DIAGNOSIS — Z3A38 38 weeks gestation of pregnancy: Secondary | ICD-10-CM

## 2016-05-05 DIAGNOSIS — O09299 Supervision of pregnancy with other poor reproductive or obstetric history, unspecified trimester: Secondary | ICD-10-CM

## 2016-05-05 DIAGNOSIS — M549 Dorsalgia, unspecified: Secondary | ICD-10-CM

## 2016-05-05 LAB — URINALYSIS, COMPLETE (UACMP) WITH MICROSCOPIC
BACTERIA UA: NONE SEEN
Bilirubin Urine: NEGATIVE
GLUCOSE, UA: NEGATIVE mg/dL
KETONES UR: NEGATIVE mg/dL
LEUKOCYTES UA: NEGATIVE
NITRITE: NEGATIVE
PH: 5 (ref 5.0–8.0)
PROTEIN: 30 mg/dL — AB
Specific Gravity, Urine: 1.018 (ref 1.005–1.030)

## 2016-05-05 NOTE — Discharge Instructions (Signed)
Drink plenty of fluid and get plenty of rest. Call your provider for any other concerns, Keep your next scheduled appointment.

## 2016-05-05 NOTE — Final Progress Note (Signed)
Physician Final Progress Note  Patient ID: Maria Fuller MRN: [id number] DOB/AGE: 1986-04-10 31 y.o.  Admit date: 05/05/2016 Admitting provider: Conard Novak, MD Discharge date: 05/05/2016   Admission Diagnoses: Back pain in pregnancy  Discharge Diagnoses: back pain in pregnancy  History of Present Illness: The patient is a 31 y.o. female G2P0101 at [redacted]w[redacted]d who presents for worsening back pain, described as bilateral.  She denies fevers. She has had chills. Notes hematuria.  She notes +FM, no LOF, no vaginal bleeding.  Denies contractions.  She has a history of hematuria and was evaluated by a urologist at the beginning of December.  Her symptoms are possibly a bit stronger than before, but similar in nature.    Past Medical History:  Diagnosis Date  . Depression   . Diabetes mellitus without complication (HCC)    resolved with weight loss from gastric bypass  . Headache    migraines  . Hypertension    resolved since gastric bypass, severe pre-eclampsia with G1  . Meningitis 2015  . PCOS (polycystic ovarian syndrome)   . Preterm labor     Past Surgical History:  Procedure Laterality Date  . CESAREAN SECTION    . CHOLECYSTECTOMY    . GASTRIC BYPASS  04/2015  . TONSILLECTOMY      No current facility-administered medications on file prior to encounter.    Current Outpatient Prescriptions on File Prior to Encounter  Medication Sig Dispense Refill  . aspirin 81 MG tablet Take 81 mg by mouth daily.    . nitrofurantoin, macrocrystal-monohydrate, (MACROBID) 100 MG capsule Take 100 mg by mouth 2 (two) times daily.    . Prenatal Vit-Fe Fumarate-FA (PRENATAL MULTIVITAMIN) TABS tablet Take 1 tablet by mouth daily at 12 noon.    . sertraline (ZOLOFT) 100 MG tablet Take 200 mg by mouth daily.     . ondansetron (ZOFRAN-ODT) 8 MG disintegrating tablet Take 8 mg by mouth every 6 (six) hours as needed for nausea or vomiting.      Allergies  Allergen Reactions  . Cefdinir Rash  .  Penicillins Rash    Has patient had a PCN reaction causing immediate rash, facial/tongue/throat swelling, SOB or lightheadedness with hypotension: Yes Has patient had a PCN reaction causing severe rash involving mucus membranes or skin necrosis: No Has patient had a PCN reaction that required hospitalization No Has patient had a PCN reaction occurring within the last 10 years: No If all of the above answers are "NO", then may proceed with Cephalosporin use.    Social History   Social History  . Marital status: Married    Spouse name: N/A  . Number of children: N/A  . Years of education: N/A   Occupational History  . Not on file.   Social History Main Topics  . Smoking status: Never Smoker  . Smokeless tobacco: Never Used  . Alcohol use No  . Drug use: No  . Sexual activity: Yes    Birth control/ protection: IUD   Other Topics Concern  . Not on file   Social History Narrative  . No narrative on file    Physical Exam: BP 137/76 (BP Location: Right Arm)   Pulse (!) 59   Temp 98.1 F (36.7 C) (Oral)   Resp 18   LMP 08/11/2015 (Exact Date)   Gen: NAD CV: RRR Pulm: CTAB Back: mild right flank tenderness Pelvic: deferred Ext: no e/c/t  Consults: None  Significant Findings/ Diagnostic Studies:  Lab Results  Component Value  Date   APPEARANCEUR CLOUDY (A) 05/05/2016   GLUCOSEU NEGATIVE 05/05/2016   BILIRUBINUR NEGATIVE 05/05/2016   KETONESUR NEGATIVE 05/05/2016   LABSPEC 1.018 05/05/2016   HGBUR LARGE (A) 05/05/2016   PHURINE 5.0 05/05/2016   NITRITE NEGATIVE 05/05/2016   LEUKOCYTESUR NEGATIVE 05/05/2016   RBCU TOO NUMEROUS TO COUNT 05/05/2016   WBCU 6-30 05/05/2016   BACTERIA NONE SEEN 05/05/2016   EPIU 0-5 (A) 05/05/2016   MUCOUSUACOMP PRESENT 05/05/2016   CAOXCRYSTALS PRESENT 05/05/2016     Procedures: NST Baseline: 135 Variability: moderate Accelerations: present Decelerations: absent Tocometry: irritability   Discharge Condition:  stable  Disposition: 01-Home or Self Care  Diet: Regular diet  Discharge Activity: Activity as tolerated   Follow-up Information    [facility] day(s).   Why:  Keep previously schedule OB appt Contact information: [address] [place] [address] [phone]          Total time spent taking care of this patient: 20 minutes  Signed: Conard NovakJackson, Genesis Paget D, MD  05/05/2016, 5:02 PM

## 2016-05-05 NOTE — Discharge Summary (Signed)
Patient discharged home, discharge instructions given, patient states understanding. Patient left floor in stable condition, denies any other needs at this time. Patient to keep next scheduled OB appointment 

## 2016-05-05 NOTE — Discharge Summary (Signed)
See Final progress note 

## 2016-05-05 NOTE — OB Triage Note (Signed)
Patient states her back started hurting last night and peeing became painful with blood in her urine this morning

## 2016-05-06 ENCOUNTER — Inpatient Hospital Stay
Admission: EM | Admit: 2016-05-06 | Discharge: 2016-05-09 | DRG: 766 | Disposition: A | Discharge status: Home / Self Care | Payer: BC Managed Care – PPO | Attending: Obstetrics & Gynecology | Admitting: Obstetrics & Gynecology

## 2016-05-06 DIAGNOSIS — O34211 Maternal care for low transverse scar from previous cesarean delivery: Principal | ICD-10-CM | POA: Diagnosis present

## 2016-05-06 DIAGNOSIS — O9081 Anemia of the puerperium: Secondary | ICD-10-CM | POA: Diagnosis present

## 2016-05-06 DIAGNOSIS — O99344 Other mental disorders complicating childbirth: Secondary | ICD-10-CM | POA: Diagnosis present

## 2016-05-06 DIAGNOSIS — F329 Major depressive disorder, single episode, unspecified: Secondary | ICD-10-CM | POA: Diagnosis present

## 2016-05-06 DIAGNOSIS — Z833 Family history of diabetes mellitus: Secondary | ICD-10-CM

## 2016-05-06 DIAGNOSIS — Z3A38 38 weeks gestation of pregnancy: Secondary | ICD-10-CM

## 2016-05-06 DIAGNOSIS — O99844 Bariatric surgery status complicating childbirth: Secondary | ICD-10-CM | POA: Diagnosis present

## 2016-05-06 MED ORDER — MORPHINE SULFATE (PF) 10 MG/ML IV SOLN
10.0000 mg | Freq: Once | INTRAVENOUS | Status: AC
  Administered 2016-05-06: 10 mg via INTRAMUSCULAR
  Filled 2016-05-06: qty 1

## 2016-05-06 MED ORDER — ONDANSETRON 4 MG PO TBDP
4.0000 mg | ORAL_TABLET | Freq: Three times a day (TID) | ORAL | Status: DC | PRN
  Administered 2016-05-06: 4 mg via ORAL
  Filled 2016-05-06: qty 1

## 2016-05-06 MED ORDER — PROMETHAZINE HCL 25 MG/ML IJ SOLN
25.0000 mg | Freq: Once | INTRAMUSCULAR | Status: AC
  Administered 2016-05-06: 25 mg via INTRAMUSCULAR
  Filled 2016-05-06: qty 1

## 2016-05-06 NOTE — H&P (Signed)
OB History & Physical   History of Present Illness:  Chief Complaint: contractions  HPI:  Maria Fuller is a 31 y.o. G57P0101 female at [redacted]w[redacted]d dated by LMP.  Her pregnancy has been complicated by obesity, h/o gastric bypass, h/o preeclampsia, h/o previous c/s, depression on medication, kidney infection on suppressive dose of macrobid.    She reports painful contractions all evening that are getting stronger.   She denies leakage of fluid.   She denies vaginal bleeding.   She reports fetal movement.    Maternal Medical History:   Past Medical History:  Diagnosis Date  . Depression   . Diabetes mellitus without complication (HCC)    resolved with weight loss from gastric bypass  . Headache    migraines  . Hypertension    resolved since gastric bypass, severe pre-eclampsia with G1  . Meningitis 2015  . PCOS (polycystic ovarian syndrome)   . Preterm labor     Past Surgical History:  Procedure Laterality Date  . CESAREAN SECTION    . CHOLECYSTECTOMY    . GASTRIC BYPASS  04/2015  . TONSILLECTOMY      Allergies  Allergen Reactions  . Cefdinir Rash  . Penicillins Rash    Has patient had a PCN reaction causing immediate rash, facial/tongue/throat swelling, SOB or lightheadedness with hypotension: Yes Has patient had a PCN reaction causing severe rash involving mucus membranes or skin necrosis: No Has patient had a PCN reaction that required hospitalization No Has patient had a PCN reaction occurring within the last 10 years: No If all of the above answers are "NO", then may proceed with Cephalosporin use.    Prior to Admission medications   Medication Sig Start Date End Date Taking? Authorizing Provider  aspirin 81 MG tablet Take 81 mg by mouth daily.   Yes Historical Provider, MD  nitrofurantoin, macrocrystal-monohydrate, (MACROBID) 100 MG capsule Take 100 mg by mouth 2 (two) times daily.   Yes Historical Provider, MD  ondansetron (ZOFRAN-ODT) 8 MG disintegrating tablet Take  8 mg by mouth every 6 (six) hours as needed for nausea or vomiting.   Yes Historical Provider, MD  Prenatal Vit-Fe Fumarate-FA (PRENATAL MULTIVITAMIN) TABS tablet Take 1 tablet by mouth daily at 12 noon.   Yes Historical Provider, MD  sertraline (ZOLOFT) 100 MG tablet Take 200 mg by mouth daily.    Yes Historical Provider, MD    OB History  Gravida Para Term Preterm AB Living  2 1   1   1   SAB TAB Ectopic Multiple Live Births          1    # Outcome Date GA Lbr Len/2nd Weight Sex Delivery Anes PTL Lv  2 Current           1 Preterm 07/09/06 [redacted]w[redacted]d  7 lb 3 oz (3.26 kg) M CS-LTranv   LIV     Complications: Preeclampsia      Prenatal care site: [facility]  Social History: She  reports that she has never smoked. She has never used smokeless tobacco. She reports that she does not drink alcohol or use drugs.  Family History: family history includes Breast cancer in her paternal grandmother; Diabetes in her father, paternal grandfather, and paternal grandmother; Endometriosis in her sister; Polycystic ovary syndrome in her mother and sister.   Review of Systems: Negative x 10 systems reviewed except as noted in the HPI.    Physical Exam:  Vital Signs: LMP 08/11/2015 (Exact Date)  General: no acute  distress.  HEENT: normocephalic, atraumatic Heart: regular rate & rhythm.  No murmurs/rubs/gallops Lungs: clear to auscultation bilaterally Abdomen: soft, gravid, non-tender;  EFW: 7.5 to 8 pounds Pelvic:   External: Normal external female genitalia  Cervix: Dilation: 1 / Effacement (%): Thick / Station: -3 on admission and no change after 4 hours of observation  Extremities: non-tender, symmetric, trace edema bilaterally.  DTRs: 2+ Neurologic: Alert & oriented x 3.    Pertinent Results:  Prenatal Labs: Blood type/Rh O positive  Antibody screen negative  Rubella Immune  Varicella Immune    RPR negative  HBsAg negative  HIV negative  GC negative  Chlamydia negative  Genetic  screening NA  1 hour GTT 63  3 hour GTT NA  GBS negative on 12/21   Baseline FHR: 135 beats/min   Variability: moderate   Accelerations: present   Decelerations: absent Contractions: present frequency: Q 2-4 Overall assessment: Category I   Assessment:  Maria Fuller is a 31 y.o. 522P0101 female at 7771w3d with prodromal labor contractions.   Plan:  1. Admit to observation  2. Therapeutic morphine rest overnight 3. Fetal well being: Category I  4. Repeat c/s scheduled for 1/11 or sooner as needed    Kaylon Laroche, CNM 05/06/2016 11:25 PM

## 2016-05-07 ENCOUNTER — Inpatient Hospital Stay: Admit: 2016-05-07 | Payer: Self-pay | Admitting: Obstetrics and Gynecology

## 2016-05-07 ENCOUNTER — Encounter
Admission: EM | Disposition: A | Discharge status: Home / Self Care | Payer: Self-pay | Source: Home / Self Care | Attending: Obstetrics & Gynecology

## 2016-05-07 ENCOUNTER — Inpatient Hospital Stay: Payer: BC Managed Care – PPO | Admitting: Anesthesiology

## 2016-05-07 DIAGNOSIS — O99344 Other mental disorders complicating childbirth: Secondary | ICD-10-CM | POA: Diagnosis present

## 2016-05-07 DIAGNOSIS — Z833 Family history of diabetes mellitus: Secondary | ICD-10-CM | POA: Diagnosis not present

## 2016-05-07 DIAGNOSIS — Z3493 Encounter for supervision of normal pregnancy, unspecified, third trimester: Secondary | ICD-10-CM | POA: Diagnosis present

## 2016-05-07 DIAGNOSIS — F329 Major depressive disorder, single episode, unspecified: Secondary | ICD-10-CM | POA: Diagnosis present

## 2016-05-07 DIAGNOSIS — Z3A38 38 weeks gestation of pregnancy: Secondary | ICD-10-CM | POA: Diagnosis not present

## 2016-05-07 DIAGNOSIS — O9081 Anemia of the puerperium: Secondary | ICD-10-CM | POA: Diagnosis present

## 2016-05-07 DIAGNOSIS — O99844 Bariatric surgery status complicating childbirth: Secondary | ICD-10-CM | POA: Diagnosis present

## 2016-05-07 DIAGNOSIS — O34211 Maternal care for low transverse scar from previous cesarean delivery: Secondary | ICD-10-CM | POA: Diagnosis present

## 2016-05-07 LAB — CBC
HEMATOCRIT: 37.4 % (ref 35.0–47.0)
Hemoglobin: 12.8 g/dL (ref 12.0–16.0)
MCH: 31.1 pg (ref 26.0–34.0)
MCHC: 34.3 g/dL (ref 32.0–36.0)
MCV: 90.8 fL (ref 80.0–100.0)
Platelets: 355 10*3/uL (ref 150–440)
RBC: 4.12 MIL/uL (ref 3.80–5.20)
RDW: 12.3 % (ref 11.5–14.5)
WBC: 12 10*3/uL — AB (ref 3.6–11.0)

## 2016-05-07 LAB — URINE CULTURE

## 2016-05-07 LAB — TYPE AND SCREEN
ABO/RH(D): O POS
ANTIBODY SCREEN: NEGATIVE

## 2016-05-07 SURGERY — Surgical Case
Anesthesia: Spinal | Site: Abdomen | Wound class: Clean Contaminated

## 2016-05-07 MED ORDER — DIBUCAINE 1 % RE OINT: 1.0000 "application " | TOPICAL_OINTMENT | RECTAL | Status: DC | PRN

## 2016-05-07 MED ORDER — KETOROLAC TROMETHAMINE 30 MG/ML IJ SOLN: 30.0000 mg | Freq: Four times a day (QID) | INTRAMUSCULAR | Status: DC | PRN

## 2016-05-07 MED ORDER — PRENATAL MULTIVITAMIN CH
1.0000 | ORAL_TABLET | Freq: Every day | ORAL | Status: DC
  Administered 2016-05-08 – 2016-05-09 (×2): 1 via ORAL
  Filled 2016-05-07 (×2): qty 1

## 2016-05-07 MED ORDER — ONDANSETRON HCL 4 MG/2ML IJ SOLN: 4.0000 mg | Freq: Three times a day (TID) | INTRAMUSCULAR | Status: DC | PRN

## 2016-05-07 MED ORDER — NALOXONE HCL 0.4 MG/ML IJ SOLN: 0.4000 mg | INTRAMUSCULAR | Status: DC | PRN

## 2016-05-07 MED ORDER — ONDANSETRON HCL 4 MG/2ML IJ SOLN
INTRAMUSCULAR | Status: AC
  Filled 2016-05-07: qty 2

## 2016-05-07 MED ORDER — KETOROLAC TROMETHAMINE 30 MG/ML IJ SOLN
30.0000 mg | Freq: Four times a day (QID) | INTRAMUSCULAR | Status: DC
  Administered 2016-05-07 – 2016-05-08 (×3): 30 mg via INTRAVENOUS
  Filled 2016-05-07 (×3): qty 1

## 2016-05-07 MED ORDER — OXYCODONE-ACETAMINOPHEN 5-325 MG PO TABS: 1.0000 | ORAL_TABLET | ORAL | Status: DC | PRN

## 2016-05-07 MED ORDER — BUPIVACAINE HCL 0.25 % IJ SOLN
INTRAMUSCULAR | Status: DC | PRN
  Administered 2016-05-07: 10 mL

## 2016-05-07 MED ORDER — FENTANYL CITRATE (PF) 100 MCG/2ML IJ SOLN
INTRAMUSCULAR | Status: DC | PRN
  Administered 2016-05-07: 15 ug via INTRAVENOUS

## 2016-05-07 MED ORDER — OXYTOCIN 40 UNITS IN LACTATED RINGERS INFUSION - SIMPLE MED: 2.5000 [IU]/h | INTRAVENOUS | Status: DC

## 2016-05-07 MED ORDER — DIPHENHYDRAMINE HCL 25 MG PO CAPS: 25.0000 mg | ORAL_CAPSULE | Freq: Four times a day (QID) | ORAL | Status: DC | PRN

## 2016-05-07 MED ORDER — OXYCODONE HCL 5 MG/5ML PO SOLN
5.0000 mg | Freq: Once | ORAL | Status: DC | PRN
  Filled 2016-05-07: qty 5

## 2016-05-07 MED ORDER — CLINDAMYCIN PHOSPHATE 900 MG/50ML IV SOLN
900.0000 mg | INTRAVENOUS | Status: AC
  Administered 2016-05-07: 900 mg via INTRAVENOUS
  Filled 2016-05-07: qty 50

## 2016-05-07 MED ORDER — BUPIVACAINE 0.25 % ON-Q PUMP DUAL CATH 400 ML
400.0000 mL | INJECTION | Status: DC
  Filled 2016-05-07 (×2): qty 400

## 2016-05-07 MED ORDER — NALBUPHINE HCL 10 MG/ML IJ SOLN: 5.0000 mg | INTRAMUSCULAR | Status: DC | PRN

## 2016-05-07 MED ORDER — TERBUTALINE SULFATE 1 MG/ML IJ SOLN
0.2500 mg | Freq: Once | INTRAMUSCULAR | Status: AC
  Administered 2016-05-07: 0.25 mg via SUBCUTANEOUS

## 2016-05-07 MED ORDER — OXYTOCIN 40 UNITS IN LACTATED RINGERS INFUSION - SIMPLE MED
INTRAVENOUS | Status: AC
  Filled 2016-05-07: qty 1000

## 2016-05-07 MED ORDER — ACETAMINOPHEN 500 MG PO TABS
1000.0000 mg | ORAL_TABLET | Freq: Four times a day (QID) | ORAL | Status: DC
  Administered 2016-05-07 – 2016-05-08 (×3): 1000 mg via ORAL
  Filled 2016-05-07 (×3): qty 2

## 2016-05-07 MED ORDER — BUPIVACAINE HCL (PF) 0.5 % IJ SOLN
INTRAMUSCULAR | Status: AC
  Filled 2016-05-07: qty 30

## 2016-05-07 MED ORDER — NALBUPHINE HCL 10 MG/ML IJ SOLN: 5.0000 mg | Freq: Once | INTRAMUSCULAR | Status: DC | PRN

## 2016-05-07 MED ORDER — SIMETHICONE 80 MG PO CHEW: 80.0000 mg | CHEWABLE_TABLET | ORAL | Status: DC

## 2016-05-07 MED ORDER — DIPHENHYDRAMINE HCL 50 MG/ML IJ SOLN: 12.5000 mg | INTRAMUSCULAR | Status: DC | PRN

## 2016-05-07 MED ORDER — LIDOCAINE HCL (PF) 2 % IJ SOLN
INTRAMUSCULAR | Status: AC
  Filled 2016-05-07: qty 2

## 2016-05-07 MED ORDER — OXYCODONE-ACETAMINOPHEN 5-325 MG PO TABS
2.0000 | ORAL_TABLET | ORAL | Status: DC | PRN
  Administered 2016-05-08 – 2016-05-09 (×6): 2 via ORAL
  Filled 2016-05-07 (×6): qty 2

## 2016-05-07 MED ORDER — BUPIVACAINE IN DEXTROSE 0.75-8.25 % IT SOLN
INTRATHECAL | Status: DC | PRN
  Administered 2016-05-07: 1.8 mL via INTRATHECAL

## 2016-05-07 MED ORDER — ACETAMINOPHEN 325 MG PO TABS: 650.0000 mg | ORAL_TABLET | ORAL | Status: DC | PRN

## 2016-05-07 MED ORDER — DIPHENHYDRAMINE HCL 25 MG PO CAPS: 25.0000 mg | ORAL_CAPSULE | ORAL | Status: DC | PRN

## 2016-05-07 MED ORDER — PHENYLEPHRINE 40 MCG/ML (10ML) SYRINGE FOR IV PUSH (FOR BLOOD PRESSURE SUPPORT)
PREFILLED_SYRINGE | INTRAVENOUS | Status: AC
  Filled 2016-05-07: qty 10

## 2016-05-07 MED ORDER — WITCH HAZEL-GLYCERIN EX PADS: 1.0000 "application " | MEDICATED_PAD | CUTANEOUS | Status: DC | PRN

## 2016-05-07 MED ORDER — MORPHINE SULFATE (PF) 0.5 MG/ML IJ SOLN
INTRAMUSCULAR | Status: DC | PRN
Start: 2016-05-07 — End: 2016-05-07
  Administered 2016-05-07: .1 mg via EPIDURAL

## 2016-05-07 MED ORDER — NALOXONE HCL 2 MG/2ML IJ SOSY
1.0000 ug/kg/h | PREFILLED_SYRINGE | INTRAVENOUS | Status: DC | PRN
  Filled 2016-05-07: qty 2

## 2016-05-07 MED ORDER — OXYCODONE HCL 5 MG PO TABS: 5.0000 mg | ORAL_TABLET | Freq: Four times a day (QID) | ORAL | Status: DC | PRN

## 2016-05-07 MED ORDER — ONDANSETRON HCL 4 MG/2ML IJ SOLN
INTRAMUSCULAR | Status: DC | PRN
  Administered 2016-05-07: 4 mg via INTRAVENOUS

## 2016-05-07 MED ORDER — FENTANYL CITRATE (PF) 100 MCG/2ML IJ SOLN
INTRAMUSCULAR | Status: AC
  Filled 2016-05-07: qty 2

## 2016-05-07 MED ORDER — EPHEDRINE 5 MG/ML INJ
INTRAVENOUS | Status: AC
  Filled 2016-05-07: qty 10

## 2016-05-07 MED ORDER — LACTATED RINGERS IV SOLN
INTRAVENOUS | Status: DC
  Administered 2016-05-07: 1000 mL via INTRAVENOUS

## 2016-05-07 MED ORDER — MORPHINE SULFATE (PF) 2 MG/ML IV SOLN: 1.0000 mg | INTRAVENOUS | Status: DC | PRN

## 2016-05-07 MED ORDER — CIPROFLOXACIN IN D5W 400 MG/200ML IV SOLN
400.0000 mg | INTRAVENOUS | Status: AC
  Administered 2016-05-07: 400 mg via INTRAVENOUS
  Filled 2016-05-07: qty 200

## 2016-05-07 MED ORDER — TERBUTALINE SULFATE 1 MG/ML IJ SOLN
INTRAMUSCULAR | Status: AC
  Administered 2016-05-07: 0.25 mg via SUBCUTANEOUS
  Filled 2016-05-07: qty 1

## 2016-05-07 MED ORDER — SOD CITRATE-CITRIC ACID 500-334 MG/5ML PO SOLN
30.0000 mL | ORAL | Status: AC
  Administered 2016-05-07: 30 mL via ORAL
  Filled 2016-05-07: qty 15

## 2016-05-07 MED ORDER — COCONUT OIL OIL: 1.0000 "application " | TOPICAL_OIL | Status: DC | PRN

## 2016-05-07 MED ORDER — OXYCODONE HCL 5 MG PO TABS: 5.0000 mg | ORAL_TABLET | Freq: Once | ORAL | Status: DC | PRN

## 2016-05-07 MED ORDER — EPHEDRINE SULFATE 50 MG/ML IJ SOLN
INTRAMUSCULAR | Status: DC | PRN
  Administered 2016-05-07: 10 mg via INTRAVENOUS

## 2016-05-07 MED ORDER — SODIUM CHLORIDE 0.9% FLUSH: 3.0000 mL | INTRAVENOUS | Status: DC | PRN

## 2016-05-07 MED ORDER — FENTANYL CITRATE (PF) 100 MCG/2ML IJ SOLN: 25.0000 ug | INTRAMUSCULAR | Status: DC | PRN

## 2016-05-07 MED ORDER — MORPHINE SULFATE (PF) 0.5 MG/ML IJ SOLN
INTRAMUSCULAR | Status: AC
  Filled 2016-05-07: qty 10

## 2016-05-07 MED ORDER — BUPIVACAINE HCL 0.5 % IJ SOLN: 10.0000 mL | Freq: Once | INTRAMUSCULAR | Status: DC

## 2016-05-07 MED ORDER — SENNOSIDES-DOCUSATE SODIUM 8.6-50 MG PO TABS
2.0000 | ORAL_TABLET | ORAL | Status: DC
  Administered 2016-05-08: 2 via ORAL
  Filled 2016-05-07: qty 2

## 2016-05-07 MED ORDER — PHENYLEPHRINE 40 MCG/ML (10ML) SYRINGE FOR IV PUSH (FOR BLOOD PRESSURE SUPPORT)
PREFILLED_SYRINGE | INTRAVENOUS | Status: DC | PRN
  Administered 2016-05-07 (×5): 100 ug via INTRAVENOUS

## 2016-05-07 MED ORDER — MENTHOL 3 MG MT LOZG
1.0000 | LOZENGE | OROMUCOSAL | Status: DC | PRN
  Filled 2016-05-07: qty 9

## 2016-05-07 MED ORDER — ZOLPIDEM TARTRATE 5 MG PO TABS: 5.0000 mg | ORAL_TABLET | Freq: Every evening | ORAL | Status: DC | PRN

## 2016-05-07 MED ORDER — OXYTOCIN 40 UNITS IN LACTATED RINGERS INFUSION - SIMPLE MED
INTRAVENOUS | Status: DC | PRN
  Administered 2016-05-07 (×2): 1000 mL via INTRAVENOUS

## 2016-05-07 MED ORDER — SIMETHICONE 80 MG PO CHEW: 80.0000 mg | CHEWABLE_TABLET | ORAL | Status: DC | PRN

## 2016-05-07 MED ORDER — SIMETHICONE 80 MG PO CHEW
80.0000 mg | CHEWABLE_TABLET | Freq: Three times a day (TID) | ORAL | Status: DC
  Administered 2016-05-08 – 2016-05-09 (×4): 80 mg via ORAL
  Filled 2016-05-07 (×5): qty 1

## 2016-05-07 MED ORDER — LACTATED RINGERS IV SOLN
INTRAVENOUS | Status: DC
  Administered 2016-05-08: 02:00:00 via INTRAVENOUS

## 2016-05-07 SURGICAL SUPPLY — 29 items
BARRIER ADHS 3X4 INTERCEED (GAUZE/BANDAGES/DRESSINGS) ×3 IMPLANT
CANISTER SUCT 3000ML (MISCELLANEOUS) ×3 IMPLANT
CATH KIT ON-Q SILVERSOAK 5IN (CATHETERS) ×6 IMPLANT
CHLORAPREP W/TINT 26ML (MISCELLANEOUS) ×6 IMPLANT
DRESSING SURGICEL FIBRLLR 1X2 (HEMOSTASIS) ×1 IMPLANT
DRSG OPSITE POSTOP 4X10 (GAUZE/BANDAGES/DRESSINGS) ×6 IMPLANT
DRSG SURGICEL FIBRILLAR 1X2 (HEMOSTASIS) ×3
ELECT CAUTERY BLADE 6.4 (BLADE) ×3 IMPLANT
ELECT REM PT RETURN 9FT ADLT (ELECTROSURGICAL) ×3
ELECTRODE REM PT RTRN 9FT ADLT (ELECTROSURGICAL) ×1 IMPLANT
GLOVE SKINSENSE NS SZ8.0 LF (GLOVE) ×4
GLOVE SKINSENSE STRL SZ8.0 LF (GLOVE) ×2 IMPLANT
GOWN STRL REUS W/ TWL LRG LVL3 (GOWN DISPOSABLE) ×1 IMPLANT
GOWN STRL REUS W/ TWL XL LVL3 (GOWN DISPOSABLE) ×2 IMPLANT
GOWN STRL REUS W/TWL LRG LVL3 (GOWN DISPOSABLE) ×2
GOWN STRL REUS W/TWL XL LVL3 (GOWN DISPOSABLE) ×4
LIQUID BAND (GAUZE/BANDAGES/DRESSINGS) ×3 IMPLANT
NS IRRIG 1000ML POUR BTL (IV SOLUTION) ×3 IMPLANT
PACK C SECTION AR (MISCELLANEOUS) ×3 IMPLANT
PAD OB MATERNITY 4.3X12.25 (PERSONAL CARE ITEMS) ×3 IMPLANT
PAD PREP 24X41 OB/GYN DISP (PERSONAL CARE ITEMS) ×3 IMPLANT
RETRACTOR TRAXI PANNICULUS (MISCELLANEOUS) ×1 IMPLANT
SPONGE LAP 18X18 5 PK (GAUZE/BANDAGES/DRESSINGS) ×3 IMPLANT
SUT MAXON ABS #0 GS21 30IN (SUTURE) ×6 IMPLANT
SUT PLAIN 2 0 XLH (SUTURE) ×3 IMPLANT
SUT VIC AB 1 CT1 36 (SUTURE) ×12 IMPLANT
SUT VIC AB 2-0 CT1 36 (SUTURE) ×3 IMPLANT
SUT VIC AB 4-0 FS2 27 (SUTURE) ×3 IMPLANT
TRAXI PANNICULUS RETRACTOR (MISCELLANEOUS) ×2

## 2016-05-07 NOTE — Anesthesia Preprocedure Evaluation (Addendum)
Anesthesia Evaluation  Patient identified by MRN, date of birth, ID band Patient awake    Reviewed: Allergy & Precautions, H&P , NPO status , Patient's Chart, lab work & pertinent test results  History of Anesthesia Complications Negative for: history of anesthetic complications  Airway Mallampati: II  TM Distance: >3 FB Neck ROM: full    Dental  (+) Poor Dentition, Chipped   Pulmonary neg pulmonary ROS, neg shortness of breath,    Pulmonary exam normal breath sounds clear to auscultation       Cardiovascular Exercise Tolerance: Good hypertension, (-) angina(-) Past MI and (-) DOE Normal cardiovascular exam Rhythm:regular Rate:Normal     Neuro/Psych  Headaches, PSYCHIATRIC DISORDERS Depression    GI/Hepatic negative GI ROS,   Endo/Other  diabetes  Renal/GU   negative genitourinary   Musculoskeletal   Abdominal   Peds  Hematology negative hematology ROS (+)   Anesthesia Other Findings Past Medical History: No date: Depression No date: Diabetes mellitus without complication (HCC)     Comment: resolved with weight loss from gastric bypass No date: Headache     Comment: migraines No date: Hypertension     Comment: resolved since gastric bypass, severe               pre-eclampsia with G1 2015: Meningitis No date: PCOS (polycystic ovarian syndrome) No date: Preterm labor  Past Surgical History: No date: CESAREAN SECTION No date: CHOLECYSTECTOMY 04/2015: GASTRIC BYPASS No date: TONSILLECTOMY  BMI    Body Mass Index:  35.89 kg/m      Reproductive/Obstetrics (+) Pregnancy                             Anesthesia Physical Anesthesia Plan  ASA: III  Anesthesia Plan: Spinal   Post-op Pain Management:    Induction:   Airway Management Planned:   Additional Equipment:   Intra-op Plan:   Post-operative Plan:   Informed Consent: I have reviewed the patients History and  Physical, chart, labs and discussed the procedure including the risks, benefits and alternatives for the proposed anesthesia with the patient or authorized representative who has indicated his/her understanding and acceptance.   Dental Advisory Given  Plan Discussed with: Anesthesiologist, CRNA and Surgeon  Anesthesia Plan Comments:        Anesthesia Quick Evaluation

## 2016-05-07 NOTE — Anesthesia Postprocedure Evaluation (Deleted)
Anesthesia Post Note  Patient: Maria Fuller  Procedure(s) Performed: Procedure(s) (LRB): CESAREAN SECTION (N/A)  Patient location during evaluation: Endoscopy Anesthesia Type: Spinal Level of consciousness: awake and alert Pain management: pain level controlled Vital Signs Assessment: post-procedure vital signs reviewed and stable Respiratory status: spontaneous breathing, nonlabored ventilation, respiratory function stable and patient connected to nasal cannula oxygen Cardiovascular status: blood pressure returned to baseline and stable Postop Assessment: no signs of nausea or vomiting Anesthetic complications: no     Last Vitals:  Vitals:   05/07/16 1145 05/07/16 1200  BP: 119/71 125/68  Pulse: (!) 47 (!) 50  Resp: 20 (!) 24  Temp:      Last Pain:  Vitals:   05/07/16 1214  TempSrc:   PainSc: 0-No pain                 Cleda MccreedyJoseph K Piscitello

## 2016-05-07 NOTE — Discharge Summary (Signed)
Obstetrical Discharge Summary  Date of Admission: 05/06/2016 Date of Discharge: @dischargedt @  Discharge Diagnosis: Term Pregnancy-delivered Primary OB:  [facility]   Gestational Age at Delivery: 2637w4d  Antepartum complications: Labor, Prior Cesarean Section Date of Delivery: 05/07/2016  Delivered By: Annamarie MajorPaul Harris, MD Delivery Type: repeat cesarean section, low transverse incision Intrapartum complications/course: None Anesthesia: spinal Placenta: manual removal Laceration: none Episiotomy: none Live born female  Birth Weight: 8 lb 11 oz (3940 g) APGAR: 8, 9   Post partum course: Since the delivery, patient has tolerate activity, diet, and daily functions without difficulty or complication.  Min lochia.  No breast concerns at this time.  No signs of depression currently.   Postpartum Exam BP 129/80 (BP Location: Right Arm)   Pulse (!) 55   Temp 98.2 F (36.8 C) (Oral)   Resp 18   Ht 6\' 1"  (1.854 m)   Wt 272 lb (123.4 kg)   LMP 08/11/2015 (Exact Date)   SpO2 100%   Breastfeeding? Unknown   BMI 35.89 kg/m   :General appearance: alert, cooperative and no distress GI: soft, non-tender; bowel sounds normal; no masses,  no organomegaly and Fundus firm and non-tender Extremities: extremities normal, atraumatic, no cyanosis or edema, Homans sign is negative, no sign of DVT and no edema, redness or tenderness in the calves or thighs Incision: Clean/dry/intact without erythema, warmth, tenderness, and induration  Disposition: home with infant Rh Immune globulin given: not applicable Rubella vaccine given: no Varicella vaccine given: no Tdap vaccine given in AP or PP setting: given during prenatal care Flu vaccine given in AP or PP setting: given during prenatal care Contraception: to be determined at post partum visit  Prenatal Labs: O POS//Rubella Immune//RPR negative//HIV negative/HepB Surface Ag negative//plans to formula feed  Plan:  Maria Fuller was discharged to home in  good condition. [facility] week for incision check and postpartum depression check  Discharge Medications: Allergies as of 05/09/2016      Reactions   Cefdinir Rash   Penicillins Rash   Has patient had a PCN reaction causing immediate rash, facial/tongue/throat swelling, SOB or lightheadedness with hypotension: Yes Has patient had a PCN reaction causing severe rash involving mucus membranes or skin necrosis: No Has patient had a PCN reaction that required hospitalization No Has patient had a PCN reaction occurring within the last 10 years: No If all of the above answers are "NO", then may proceed with Cephalosporin use.      Medication List    STOP taking these medications   aspirin 81 MG tablet   nitrofurantoin (macrocrystal-monohydrate) 100 MG capsule Commonly known as:  MACROBID   ondansetron 8 MG disintegrating tablet Commonly known as:  ZOFRAN-ODT     TAKE these medications   oxyCODONE-acetaminophen 5-325 MG tablet Commonly known as:  PERCOCET/ROXICET Take 2 tablets by mouth every 4 (four) hours as needed (pain scale > 7).   prenatal multivitamin Tabs tablet Take 1 tablet by mouth daily at 12 noon.   sertraline 100 MG tablet Commonly known as:  ZOLOFT Take 200 mg by mouth daily.       Follow-up arrangements:  Follow-up Information    Letitia Libraobert Paul Harris, MD Follow up in 1 week(s).   Specialty:  Obstetrics and Gynecology Contact information: [address] [place] [address] [phone]          Thomasene MohairStephen Jackson, MD 05/09/2016 10:04 AM

## 2016-05-07 NOTE — Anesthesia Procedure Notes (Signed)
Date/Time: 05/07/2016 9:30 AM Performed by: Henrietta HooverPOPE, Malikye Reppond Pre-anesthesia Checklist: Patient identified, Emergency Drugs available, Suction available, Patient being monitored and Timeout performed Patient Re-evaluated:Patient Re-evaluated prior to inductionOxygen Delivery Method: Nasal cannula Placement Confirmation: positive ETCO2

## 2016-05-07 NOTE — Progress Notes (Signed)
  Labor Progress Note   31 y.o. [id number] @ 5750w4d , admitted for Early vs false labor on admission.  Subjective:  Pain with ctxs still present; ctxs every 5 min.  Did get some rest on Morphine overnight but ctxs persisted.  Pt is NPO since MN.  Objective:  BP 122/62   Pulse (!) 57   Ht 6\' 1"  (1.854 m)   Wt 272 lb (123.4 kg)   LMP 08/11/2015 (Exact Date)   BMI 35.89 kg/m  Abd: moderate Extr: trace to 1+ bilateral pedal edema SVE: CERVIX: unchanged but Vtx low  EFM: FHR: 140 bpm, variability: moderate,  accelerations:  Present,  decelerations:  Absent Toco: Frequency: Every 5 minutes Labs: I have reviewed the patient's lab results.   Assessment & Plan:  [id number] @ 2750w4d, admitted for  Pregnancy and Labor/Delivery Management  1. Pain management: none. 2. FWB: FHT category 1.  3. ID: GBS will get results soon for peds 4. Labor management: As still contracting painfully and regularly, decision made for CS (repeat, was scheduled for next week).  Pros and cons discussed. Consent obtained.  Risks of surgery as well as 38 weeks maturity discussed.  The risks of cesarean section discussed with the patient included but were not limited to: bleeding which may require transfusion or reoperation; infection which may require antibiotics; injury to bowel, bladder, ureters or other surrounding organs; injury to the fetus; need for additional procedures including hysterectomy in the event of a life-threatening hemorrhage; placental abnormalities wth subsequent pregnancies, incisional problems, thromboembolic phenomenon and other postoperative/anesthesia complications. The patient concurred with the proposed plan, giving informed written consent for the procedure.   All discussed with patient, see orders

## 2016-05-07 NOTE — Progress Notes (Signed)
Admit Date: 05/06/2016 Today's Date: 05/07/2016  Subjective: Postpartum Day 0: Cesarean Delivery Patient reports mild pain in abdomen when fundal massage performed  Objective: Vital signs in last 24 hours: Temp:  [97.8 F (36.6 C)-98.6 F (37 C)] 98.4 F (36.9 C) (01/03 1600) Pulse Rate:  [44-72] 51 (01/03 1600) Resp:  [14-24] 20 (01/03 1600) BP: (107-125)/(57-73) 107/57 (01/03 1600) SpO2:  [95 %-100 %] 99 % (01/03 1600) Weight:  [272 lb (123.4 kg)] 272 lb (123.4 kg) (01/03 0449)  Physical Exam:  General: alert, cooperative and no distress Lochia: appropriate Uterine Fundus: firm Incision: healing well DVT Evaluation: No evidence of DVT seen on physical exam.   Recent Labs  05/07/16 0624  HGB 12.8  HCT 37.4    Assessment/Plan: Status post Cesarean section. Doing well postoperatively.  Continue current care.  Letitia Libraobert Paul Harris 05/07/2016, 4:32 PM

## 2016-05-07 NOTE — Transfer of Care (Signed)
Immediate Anesthesia Transfer of Care Note  Patient: Maria Fuller  Procedure(s) Performed: Procedure(s) with comments: CESAREAN SECTION (N/A) - Time of birth: 10:16 Sex: Female Weight: 3940 kg, 8 lb 11 oz  Patient Location: PACU  Anesthesia Type:Spinal  Level of Consciousness: awake  Airway & Oxygen Therapy: Patient Spontanous Breathing and Patient connected to nasal cannula oxygen  Post-op Assessment: Report given to RN and Post -op Vital signs reviewed and stable  Post vital signs: Reviewed and stable  Last Vitals:  Vitals:   05/07/16 0608 05/07/16 0731  BP:  121/64  Pulse:  (!) 49  Resp: 16 16  Temp: 36.7 C 36.6 C    Last Pain:  Vitals:   05/07/16 0731  TempSrc: Oral  PainSc: 8          Complications: No apparent anesthesia complications

## 2016-05-07 NOTE — Anesthesia Procedure Notes (Addendum)
Spinal  Patient location during procedure: OR Start time: 05/07/2016 9:42 AM End time: 05/07/2016 9:54 AM Staffing Anesthesiologist: Andria Frames Other anesthesia staff: Martha Clan Performed: anesthesiologist and other anesthesia staff  Preanesthetic Checklist Completed: patient identified, site marked, surgical consent, pre-op evaluation, timeout performed, IV checked, risks and benefits discussed and monitors and equipment checked Spinal Block Patient position: sitting Prep: ChloraPrep Patient monitoring: heart rate, continuous pulse ox, blood pressure and cardiac monitor Approach: midline Location: L2-3 Injection technique: single-shot Needle Needle type: Whitacre and Introducer  Needle gauge: 24 G Needle length: 9 cm Assessment Sensory level: T2 Additional Notes Patient very deep.  Multiple paresthesias that resolved with needle direction.  First attempts by Piscitello, final attempts by Rosey Bath  Negative paresthesia. Negative blood return. Positive free-flowing CSF. Expiration date of kit checked and confirmed. Patient tolerated procedure well, without complications.

## 2016-05-07 NOTE — Discharge Instructions (Signed)

## 2016-05-07 NOTE — Op Note (Signed)
Cesarean Section Procedure Note Indications: prior cesarean section and term intrauterine pregnancy  Pre-operative Diagnosis: Intrauterine pregnancy [id number] ;  In Labor (need for CS prior to 39 weeks), prior cesarean section and term intrauterine pregnancy Post-operative Diagnosis: same, delivered. Procedure: Low Transverse Cesarean Section Surgeon: Annamarie MajorPaul Airyanna Dipalma, MD, FACOG Anesthesia: Spinal anesthesia Estimated Blood Loss:300  Complications: None; patient tolerated the procedure well. Disposition: PACU - hemodynamically stable. Condition: stable  Findings: A female infant in the cephalic presentation. Amniotic fluid - Meconium  Birth weight 8-11 lbs.  Apgars of 8 and 9.  Intact placenta with a three-vessel cord. Grossly normal uterus, tubes and ovaries bilaterally. Min intraabdominal adhesions were noted.  Procedure Details   The patient was taken to Operating Room, identified as the correct patient and the procedure verified as C-Section Delivery. A Time Out was held and the above information confirmed. After induction of anesthesia, the patient was draped and prepped in the usual sterile manner. A Traxis was used to assist retraction of abdominal pannus.  A Pfannenstiel incision was made and carried down through the subcutaneous tissue to the fascia. Fascial incision was made and extended transversely with the Mayo scissors. The fascia was separated from the underlying rectus tissue superiorly and inferiorly. The peritoneum was identified and entered bluntly. Peritoneal incision was extended longitudinally. The utero-vesical peritoneal reflection was incised transversely and a bladder flap was created digitally.  A low transverse hysterotomy was made. The fetus was delivered atraumatically. The umbilical cord was clamped x2 and cut and the infant was handed to the awaiting pediatricians. The placenta was removed intact and appeared normal with a 3-vessel cord.  The uterus was exteriorized and  cleared of all clot and debris. The hysterotomy was closed with running sutures of 0 Vicryl suture. A second imbricating layer was placed with the same suture. Excellent hemostasis was observed. The uterus was returned to the abdomen.  Fibrilar was placed over incision initially to assist with hemostasis.  Interceed was later applied for adhesion prevention. The pelvis was irrigated and again, excellent hemostasis was noted.  The On Q Pain pump System was then placed.  Trocars were placed through the abdominal wall into the subfascial space and these were used to thread the silver soaker cathaters into place.The rectus fascia was then reapproximated with running sutures of Maxon, with careful placement not to incorporate the cathaters. Subcutaneous tissues are then irrigated with saline and hemostasis assured.  Skin is then closed with 4-0 vicryl suture in a subcuticular fashion followed by skin adhesive. The cathaters are flushed each with 5 mL of Bupivicaine and stabilized into place with dressing. Instrument, sponge, and needle counts were correct prior to the abdominal closure and at the conclusion of the case.  The patient tolerated the procedure well and was transferred to the recovery room in stable condition.

## 2016-05-08 LAB — CBC
HEMATOCRIT: 31.4 % — AB (ref 35.0–47.0)
HEMOGLOBIN: 10.7 g/dL — AB (ref 12.0–16.0)
MCH: 31.2 pg (ref 26.0–34.0)
MCHC: 34 g/dL (ref 32.0–36.0)
MCV: 91.5 fL (ref 80.0–100.0)
Platelets: 295 10*3/uL (ref 150–440)
RBC: 3.43 MIL/uL — ABNORMAL LOW (ref 3.80–5.20)
RDW: 12.3 % (ref 11.5–14.5)
WBC: 11.9 10*3/uL — ABNORMAL HIGH (ref 3.6–11.0)

## 2016-05-08 MED ORDER — INFLUENZA VAC SPLIT QUAD 0.5 ML IM SUSY
0.5000 mL | PREFILLED_SYRINGE | INTRAMUSCULAR | Status: AC
  Administered 2016-05-09: 0.5 mL via INTRAMUSCULAR
  Filled 2016-05-08: qty 0.5

## 2016-05-08 MED ORDER — SERTRALINE HCL 100 MG PO TABS
200.0000 mg | ORAL_TABLET | Freq: Every day | ORAL | Status: DC
  Administered 2016-05-08: 200 mg via ORAL
  Filled 2016-05-08: qty 2

## 2016-05-08 MED ORDER — WHITE PETROLATUM GEL
Status: AC
  Filled 2016-05-08: qty 20

## 2016-05-08 NOTE — Anesthesia Post-op Follow-up Note (Signed)
  Anesthesia Pain Follow-up Note  Patient: Maria Fuller  Day #: 1  Date of Follow-up: 05/08/2016 Time: 7:37 AM  Last Vitals:  Vitals:   05/07/16 2350 05/08/16 0356  BP: 126/65 124/72  Pulse: (!) 54 (!) 52  Resp: 18 18  Temp: 37 C 37.1 C    Level of Consciousness: alert  Pain: mild   Side Effects:None  Catheter Site Exam:clean     Plan: D/C from anesthesia care at surgeon's request  Jules SchickLogan,  Abrey Bradway P

## 2016-05-08 NOTE — Progress Notes (Addendum)
  Subjective:  Doing well, pain well controlled on oral analgesics, tolerating general diet, no fever, no chills.  Minimal lochia.     Objective:  Blood pressure 118/72, pulse (!) 52, temperature 98.4 F (36.9 C), temperature source Oral, resp. rate 18, height 6\' 1"  (1.854 m), weight 272 lb (123.4 kg), last menstrual period 08/11/2015, SpO2 96 %, unknown if currently breastfeeding.  General: NAD Pulmonary: no increased work of breathing Abdomen: non-distended, non-tender, fundus firm at level of umbilicus Incision: D/C/I honeycomb dressing Extremities: no edema, no erythema, no tenderness  Results for orders placed or performed during the hospital encounter of 05/06/16 (from the past 72 hour(s))  CBC     Status: Abnormal   Collection Time: 05/07/16  6:24 AM  Result Value Ref Range   WBC 12.0 (H) 3.6 - 11.0 K/uL   RBC 4.12 3.80 - 5.20 MIL/uL   Hemoglobin 12.8 12.0 - 16.0 g/dL   HCT 40.937.4 81.135.0 - 91.447.0 %   MCV 90.8 80.0 - 100.0 fL   MCH 31.1 26.0 - 34.0 pg   MCHC 34.3 32.0 - 36.0 g/dL   RDW 78.212.3 95.611.5 - 21.314.5 %   Platelets 355 150 - 440 K/uL  Type and screen [facility]     Status: None   Collection Time: 05/07/16  6:24 AM  Result Value Ref Range   ABO/RH(D) O POS    Antibody Screen NEG    Sample Expiration 05/10/2016   CBC     Status: Abnormal   Collection Time: 05/08/16  5:55 AM  Result Value Ref Range   WBC 11.9 (H) 3.6 - 11.0 K/uL   RBC 3.43 (L) 3.80 - 5.20 MIL/uL   Hemoglobin 10.7 (L) 12.0 - 16.0 g/dL   HCT 08.631.4 (L) 57.835.0 - 46.947.0 %   MCV 91.5 80.0 - 100.0 fL   MCH 31.2 26.0 - 34.0 pg   MCHC 34.0 32.0 - 36.0 g/dL   RDW 62.912.3 52.811.5 - 41.314.5 %   Platelets 295 150 - 440 K/uL     Assessment:   30 y.o. [id number] postoperativeday # 1 RTLCS   Plan:  1) Acute blood loss anemia - hemodynamically stable and asymptomatic - po ferrous sulfate  2) --/--/O POS (01/03 [id number]) / Rubella Immune / Varicella Immune  3) Depression - restart home dose of Zoloft  4)  Breast/undecided  5) Disposition anticipate discharge POD 2-3

## 2016-05-08 NOTE — Anesthesia Postprocedure Evaluation (Signed)
Anesthesia Post Note  Patient: Maria Fuller  Procedure(s) Performed: Procedure(s) (LRB): CESAREAN SECTION (N/A)  Patient location during evaluation: Mother Baby Anesthesia Type: Spinal Level of consciousness: oriented and awake and alert Pain management: pain level controlled Vital Signs Assessment: post-procedure vital signs reviewed and stable Respiratory status: spontaneous breathing, respiratory function stable and patient connected to nasal cannula oxygen Cardiovascular status: blood pressure returned to baseline and stable Postop Assessment: no headache, no backache, spinal receding and patient able to bend at knees Anesthetic complications: no     Last Vitals:  Vitals:   05/07/16 2350 05/08/16 0356  BP: 126/65 124/72  Pulse: (!) 54 (!) 52  Resp: 18 18  Temp: 37 C 37.1 C    Last Pain:  Vitals:   05/08/16 0356  TempSrc: Oral  PainSc:                  Jules SchickLogan,  Jara Feider P

## 2016-05-09 ENCOUNTER — Encounter: Payer: Self-pay | Admitting: Obstetrics & Gynecology

## 2016-05-09 MED ORDER — IBUPROFEN 600 MG PO TABS: 600.0000 mg | ORAL_TABLET | Freq: Four times a day (QID) | ORAL | 0 refills | Status: DC | PRN

## 2016-05-09 MED ORDER — OXYCODONE-ACETAMINOPHEN 5-325 MG PO TABS: 2.0000 | ORAL_TABLET | ORAL | 0 refills | Status: DC | PRN

## 2016-05-09 NOTE — Progress Notes (Signed)
Discharge inst given to mom.  Rx given for home use.  Pt verb u/o

## 2016-05-09 NOTE — Progress Notes (Signed)
Discharged to home to car via auxillary with newborn

## 2016-05-14 ENCOUNTER — Inpatient Hospital Stay: Admission: RE | Admit: 2016-05-14 | Payer: BC Managed Care – PPO | Source: Ambulatory Visit

## 2016-06-02 ENCOUNTER — Ambulatory Visit: Payer: BC Managed Care – PPO | Admitting: Surgery

## 2016-07-16 ENCOUNTER — Encounter: Payer: Self-pay | Admitting: Obstetrics & Gynecology

## 2016-07-16 ENCOUNTER — Ambulatory Visit (INDEPENDENT_AMBULATORY_CARE_PROVIDER_SITE_OTHER): Payer: BC Managed Care – PPO | Admitting: Obstetrics & Gynecology

## 2016-07-16 VITALS — BP 110/60 | HR 49 | Ht 73.0 in | Wt 240.0 lb

## 2016-07-16 DIAGNOSIS — Z30431 Encounter for routine checking of intrauterine contraceptive device: Secondary | ICD-10-CM | POA: Diagnosis not present

## 2016-07-16 NOTE — Progress Notes (Signed)
  History of Present Illness:  Maria Fuller is a 31 y.o. that had a Liletta IUD placed approximately 4 weeks ago. Since that time, she states that she has had no bleeding and pain  PMHx: She  has a past medical history of Depression; Diabetes mellitus without complication (HCC); Headache; Hypertension; Meningitis (2015); PCOS (polycystic ovarian syndrome); and Preterm labor. Also,  has a past surgical history that includes Cholecystectomy; Cesarean section; Tonsillectomy; Gastric bypass (04/2015); and Cesarean section (N/A, 05/07/2016)., family history includes Breast cancer in her paternal grandmother; Diabetes in her father, paternal grandfather, and paternal grandmother; Endometriosis in her sister; Polycystic ovary syndrome in her mother and sister.,  reports that she has never smoked. She has never used smokeless tobacco. She reports that she does not drink alcohol or use drugs.  She has a current medication list which includes the following prescription(s): sertraline, ibuprofen, oxycodone-acetaminophen, and prenatal multivitamin. Also, is allergic to cefdinir and penicillins.  Review of Systems  Constitutional: Negative for chills, fever and malaise/fatigue.  HENT: Negative for congestion, sinus pain and sore throat.   Eyes: Negative for blurred vision and pain.  Respiratory: Negative for cough and wheezing.   Cardiovascular: Negative for chest pain and leg swelling.  Gastrointestinal: Negative for abdominal pain, constipation, diarrhea, heartburn, nausea and vomiting.  Genitourinary: Negative for dysuria, frequency, hematuria and urgency.  Musculoskeletal: Negative for back pain, joint pain, myalgias and neck pain.  Skin: Negative for itching and rash.  Neurological: Negative for dizziness, tremors and weakness.  Endo/Heme/Allergies: Does not bruise/bleed easily.  Psychiatric/Behavioral: Negative for depression. The patient is not nervous/anxious and does not have insomnia.      Physical Exam:  BP 110/60   Pulse (!) 49   Ht 6\' 1"  (1.854 m)   Wt 240 lb (108.9 kg)   BMI 31.66 kg/m  Body mass index is 31.66 kg/m. Constitutional: Well nourished, well developed female in no acute distress.  Abdomen: diffusely non tender to palpation, non distended, and no masses, hernias Neuro: Grossly intact Psych:  Normal mood and affect.    Pelvic exam:  Two IUD strings present seen coming from the cervical os. EGBUS, vaginal vault and cervix: within normal limits  Assessment: IUD strings present in proper location; pt doing well  Plan: She was told to continue to use barrier contraception, in order to prevent any STIs, and to take a home pregnancy test or call us if she ever thinks she may be pregnant, and that her IUD expires in 5 years.  She was amenable to this plan and we will see her back in 1 year/PRN.  Annamarie MajorPaul Keoni Havey, MD, Merlinda FrederickFACOG [facility], [facility] 07/16/2016  4:39 PM

## 2016-08-24 ENCOUNTER — Emergency Department
Admission: EM | Admit: 2016-08-24 | Discharge: 2016-08-24 | Disposition: A | Discharge status: Home / Self Care | Payer: BC Managed Care – PPO | Attending: Emergency Medicine | Admitting: Emergency Medicine

## 2016-08-24 ENCOUNTER — Encounter: Payer: Self-pay | Admitting: Emergency Medicine

## 2016-08-24 DIAGNOSIS — Z5321 Procedure and treatment not carried out due to patient leaving prior to being seen by health care provider: Secondary | ICD-10-CM | POA: Insufficient documentation

## 2016-08-24 DIAGNOSIS — Z79899 Other long term (current) drug therapy: Secondary | ICD-10-CM | POA: Insufficient documentation

## 2016-08-24 DIAGNOSIS — R109 Unspecified abdominal pain: Secondary | ICD-10-CM | POA: Insufficient documentation

## 2016-08-24 DIAGNOSIS — I10 Essential (primary) hypertension: Secondary | ICD-10-CM | POA: Insufficient documentation

## 2016-08-24 DIAGNOSIS — E119 Type 2 diabetes mellitus without complications: Secondary | ICD-10-CM | POA: Insufficient documentation

## 2016-08-24 LAB — CBC
HEMATOCRIT: 37.4 % (ref 35.0–47.0)
Hemoglobin: 12.3 g/dL (ref 12.0–16.0)
MCH: 29.4 pg (ref 26.0–34.0)
MCHC: 32.9 g/dL (ref 32.0–36.0)
MCV: 89.5 fL (ref 80.0–100.0)
PLATELETS: 371 10*3/uL (ref 150–440)
RBC: 4.18 MIL/uL (ref 3.80–5.20)
RDW: 12.7 % (ref 11.5–14.5)
WBC: 6.8 10*3/uL (ref 3.6–11.0)

## 2016-08-24 LAB — URINALYSIS, COMPLETE (UACMP) WITH MICROSCOPIC
BACTERIA UA: NONE SEEN
Bilirubin Urine: NEGATIVE
Glucose, UA: NEGATIVE mg/dL
Ketones, ur: NEGATIVE mg/dL
LEUKOCYTES UA: NEGATIVE
Nitrite: NEGATIVE
PROTEIN: 30 mg/dL — AB
Specific Gravity, Urine: 1.023 (ref 1.005–1.030)
pH: 5 (ref 5.0–8.0)

## 2016-08-24 LAB — BASIC METABOLIC PANEL
Anion gap: 6 (ref 5–15)
BUN: 13 mg/dL (ref 6–20)
CALCIUM: 8.9 mg/dL (ref 8.9–10.3)
CO2: 26 mmol/L (ref 22–32)
CREATININE: 0.47 mg/dL (ref 0.44–1.00)
Chloride: 108 mmol/L (ref 101–111)
GFR calc Af Amer: >60 mL/min (ref >60)
Glucose, Bld: 70 mg/dL (ref 65–99)
POTASSIUM: 3.7 mmol/L (ref 3.5–5.1)
SODIUM: 140 mmol/L (ref 135–145)

## 2016-08-24 LAB — POCT PREGNANCY, URINE: PREG TEST UR: NEGATIVE

## 2016-08-24 NOTE — ED Triage Notes (Signed)
Pt comes into the ED via POV c/o left flank pain and bladder pain.  Patient states she has had an increase in urination and pink tinged urine.  Patient in NAD at this time and ambulatory to triage.  Patient has h/o kidney stones in the past and states this feels similar.

## 2016-08-24 NOTE — ED Notes (Signed)
Pt to front desk states she passed a kidney stone and the pain is improved and is going to go home. Encouraged patient to stay to be evaluated but states she is going to go home. Discussed return precautions such as pain that returns or blood in urine, fevers, n/v.  Pt verbalized understanding.

## 2016-08-26 ENCOUNTER — Telehealth: Payer: Self-pay | Admitting: Emergency Medicine

## 2016-08-26 NOTE — Telephone Encounter (Signed)
Called patient due to lwot to inquire about condition and follow up plans. Left message.   

## 2016-09-08 ENCOUNTER — Encounter: Payer: Self-pay | Admitting: Emergency Medicine

## 2016-09-08 ENCOUNTER — Emergency Department: Payer: Managed Care, Other (non HMO)

## 2016-09-08 ENCOUNTER — Emergency Department
Admission: EM | Admit: 2016-09-08 | Discharge: 2016-09-08 | Disposition: A | Discharge status: Home / Self Care | Payer: Managed Care, Other (non HMO) | Attending: Emergency Medicine | Admitting: Emergency Medicine

## 2016-09-08 DIAGNOSIS — N200 Calculus of kidney: Secondary | ICD-10-CM | POA: Diagnosis not present

## 2016-09-08 DIAGNOSIS — I10 Essential (primary) hypertension: Secondary | ICD-10-CM | POA: Insufficient documentation

## 2016-09-08 DIAGNOSIS — R109 Unspecified abdominal pain: Secondary | ICD-10-CM

## 2016-09-08 DIAGNOSIS — E119 Type 2 diabetes mellitus without complications: Secondary | ICD-10-CM | POA: Diagnosis not present

## 2016-09-08 DIAGNOSIS — Z79899 Other long term (current) drug therapy: Secondary | ICD-10-CM | POA: Diagnosis not present

## 2016-09-08 LAB — URINALYSIS, COMPLETE (UACMP) WITH MICROSCOPIC
Bacteria, UA: NONE SEEN
Bilirubin Urine: NEGATIVE
GLUCOSE, UA: NEGATIVE mg/dL
Hgb urine dipstick: NEGATIVE
Ketones, ur: NEGATIVE mg/dL
NITRITE: NEGATIVE
PROTEIN: NEGATIVE mg/dL
Specific Gravity, Urine: 1.019 (ref 1.005–1.030)
pH: 5 (ref 5.0–8.0)

## 2016-09-08 LAB — CBC
HCT: 38.2 % (ref 35.0–47.0)
HEMOGLOBIN: 12.9 g/dL (ref 12.0–16.0)
MCH: 30.3 pg (ref 26.0–34.0)
MCHC: 33.9 g/dL (ref 32.0–36.0)
MCV: 89.3 fL (ref 80.0–100.0)
PLATELETS: 385 10*3/uL (ref 150–440)
RBC: 4.28 MIL/uL (ref 3.80–5.20)
RDW: 12.5 % (ref 11.5–14.5)
WBC: 7.5 10*3/uL (ref 3.6–11.0)

## 2016-09-08 LAB — LIPASE, BLOOD: Lipase: 19 U/L (ref 11–51)

## 2016-09-08 LAB — COMPREHENSIVE METABOLIC PANEL
ALK PHOS: 70 U/L (ref 38–126)
ALT: 18 U/L (ref 14–54)
ANION GAP: 7 (ref 5–15)
AST: 17 U/L (ref 15–41)
Albumin: 4.7 g/dL (ref 3.5–5.0)
BILIRUBIN TOTAL: 0.7 mg/dL (ref 0.3–1.2)
BUN: 14 mg/dL (ref 6–20)
CALCIUM: 9.2 mg/dL (ref 8.9–10.3)
CO2: 28 mmol/L (ref 22–32)
CREATININE: 0.44 mg/dL (ref 0.44–1.00)
Chloride: 106 mmol/L (ref 101–111)
Glucose, Bld: 86 mg/dL (ref 65–99)
Potassium: 4.3 mmol/L (ref 3.5–5.1)
Sodium: 141 mmol/L (ref 135–145)
TOTAL PROTEIN: 7.9 g/dL (ref 6.5–8.1)

## 2016-09-08 LAB — POCT PREGNANCY, URINE: PREG TEST UR: NEGATIVE

## 2016-09-08 MED ORDER — OXYCODONE HCL 5 MG PO TABS: 5.0000 mg | ORAL_TABLET | Freq: Four times a day (QID) | ORAL | 0 refills | Status: DC | PRN

## 2016-09-08 MED ORDER — KETOROLAC TROMETHAMINE 30 MG/ML IJ SOLN
INTRAMUSCULAR | Status: AC
  Administered 2016-09-08: 15 mg via INTRAMUSCULAR
  Filled 2016-09-08: qty 1

## 2016-09-08 MED ORDER — KETOROLAC TROMETHAMINE 60 MG/2ML IM SOLN
15.0000 mg | Freq: Once | INTRAMUSCULAR | Status: AC
  Administered 2016-09-08: 15 mg via INTRAMUSCULAR

## 2016-09-08 NOTE — ED Triage Notes (Signed)
Pt reports passing 3 kidney stones this weekend, reports right flank has gotten worse. Pt reports hematuria, burning and frequency with urination.

## 2016-09-08 NOTE — ED Provider Notes (Signed)
[facility] Emergency Department Provider Note  ____________________________________________  Time seen: Approximately 3:31 PM  I have reviewed the triage vital signs and the nursing notes.   HISTORY  Chief Complaint Flank Pain    HPI Maria Fuller is a 31 y.o. female who complains of right flank pain radiating to the right groin for the past 3 days. The pain is waxing and waning, aching and sharp. Severe. No aggravating or alleviating factors. It feels like previous episodes of kidney stones that she's had in the past. She was straining her urine and actually noticed that she is pressed 3 very small kidney stones yesterday.However, the pain is continued to worsen. No fever chills vomiting dizziness syncope. No dysuria frequency or urgency.     Past Medical History:  Diagnosis Date  . Depression   . Diabetes mellitus without complication (HCC)    resolved with weight loss from gastric bypass  . Headache    migraines  . Hypertension    resolved since gastric bypass, severe pre-eclampsia with G1  . Meningitis 2015  . PCOS (polycystic ovarian syndrome)   . Preterm labor      Patient Active Problem List   Diagnosis Date Noted  . Labor and delivery, indication for care 05/05/2016  . Back pain affecting pregnancy 04/17/2016  . Labor and delivery indication for care or intervention 02/26/2016  . Abdominal pain in pregnancy 02/10/2016  . Left flank pain 12/11/2015  . 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 Hx of preeclampsia, prior pregnancy, currently pregnant 10/18/2015  . Previous cesarean delivery, antepartum condition or complication 10/18/2015  . Depression affecting pregnancy in first trimester, antepartum 10/18/2015  . Supervision of high risk pregnancy in first trimester 10/06/2015  . Right flank pain 10/06/2015  . Pyelonephritis affecting pregnancy in first trimester 10/06/2015  .  Pyelonephritis complicating pregnancy 10/06/2015     Past Surgical History:  Procedure Laterality Date  . CESAREAN SECTION    . CESAREAN SECTION N/A 05/07/2016   Procedure: CESAREAN SECTION;  Surgeon: Nadara Mustard, MD;  Location: [facility] ORS;  Service: Obstetrics;  Laterality: N/A;  Time of birth: 10:16 Sex: Female Weight: 3940 kg, 8 lb 11 oz  . CHOLECYSTECTOMY    . GASTRIC BYPASS  04/2015  . TONSILLECTOMY       Prior to Admission medications   Medication Sig Start Date End Date Taking? Authorizing Provider  ibuprofen (ADVIL,MOTRIN) 600 MG tablet Take 1 tablet (600 mg total) by mouth every 6 (six) hours as needed for mild pain or cramping. Patient not taking: Reported on 07/16/2016 05/09/16   Conard Novak, MD  oxyCODONE (ROXICODONE) 5 MG immediate release tablet Take 1 tablet (5 mg total) by mouth every 6 (six) hours as needed for breakthrough pain. 09/08/16   Sharman Cheek, MD  oxyCODONE-acetaminophen (PERCOCET/ROXICET) 5-325 MG tablet Take 2 tablets by mouth every 4 (four) hours as needed (pain scale > 7). Patient not taking: Reported on 07/16/2016 05/09/16   Conard Novak, MD  Prenatal Vit-Fe Fumarate-FA (PRENATAL MULTIVITAMIN) TABS tablet Take 1 tablet by mouth daily at 12 noon.    [provider]  sertraline (ZOLOFT) 100 MG tablet Take 200 mg by mouth daily.     [provider]     Allergies Cefdinir and Penicillins   Family History  Problem Relation Age of Onset  . Polycystic ovary syndrome Mother   . Diabetes Father   . Endometriosis Sister   . Polycystic  ovary syndrome Sister   . Breast cancer Paternal Grandmother   . Diabetes Paternal Grandmother   . Diabetes Paternal Grandfather     Social History Social History  Substance Use Topics  . Smoking status: Never Smoker  . Smokeless tobacco: Never Used  . Alcohol use No    Review of Systems  Constitutional:   No fever or chills.  ENT:   No sore throat. No rhinorrhea. Lymphatic: No  swollen glands, No extremity swelling Endocrine: No hot/cold flashes. No significant weight change. No neck swelling. Cardiovascular:   No chest pain or syncope. Respiratory:   No dyspnea or cough. Gastrointestinal:   Right flank pain as above without vomiting and diarrhea.  Genitourinary:   Negative for dysuria or difficulty urinating. Positive hematuria Musculoskeletal:   Negative for focal pain or swelling Neurological:   Negative for headaches or weakness. All other systems reviewed and are negative except as documented above in ROS and HPI.  ____________________________________________   PHYSICAL EXAM:  VITAL SIGNS: ED Triage Vitals  Enc Vitals Group     BP 09/08/16 0954 138/67     Pulse Rate 09/08/16 0953 70     Resp 09/08/16 0953 14     Temp 09/08/16 0953 98.6 F (37 C)     Temp Source 09/08/16 0953 Oral     SpO2 09/08/16 0953 100 %     Weight 09/08/16 0953 235 lb (106.6 kg)     Height 09/08/16 0953 6\' 1"  (1.854 m)     Head Circumference --      Peak Flow --      Pain Score 09/08/16 0953 7     Pain Loc --      Pain Edu? --      Excl. in GC? --     Vital signs reviewed, nursing assessments reviewed.   Constitutional:   Alert and oriented. Well appearing and in no distress. Eyes:   No scleral icterus. No conjunctival pallor. PERRL. EOMI.  No nystagmus. ENT   Head:   Normocephalic and atraumatic.   Nose:   No congestion/rhinnorhea. No septal hematoma   Mouth/Throat:   MMM, no pharyngeal erythema. No peritonsillar mass.    Neck:   No stridor. No SubQ emphysema. No meningismus. Hematological/Lymphatic/Immunilogical:   No cervical lymphadenopathy. Cardiovascular:   RRR. Symmetric bilateral radial and DP pulses.  No murmurs.  Respiratory:   Normal respiratory effort without tachypnea nor retractions. Breath sounds are clear and equal bilaterally. No wheezes/rales/rhonchi. Gastrointestinal:   Soft and nontender. Non distended. There is no CVA tenderness.  No  rebound, rigidity, or guarding. Genitourinary:   deferred Musculoskeletal:   Normal range of motion in all extremities. No joint effusions.  No lower extremity tenderness.  No edema. Neurologic:   Normal speech and language.  CN 2-10 normal. Motor grossly intact. No gross focal neurologic deficits are appreciated.  Skin:    Skin is warm, dry and intact. No rash noted.  No petechiae, purpura, or bullae.  ____________________________________________    LABS (pertinent positives/negatives) (all labs ordered are listed, but only abnormal results are displayed) Labs Reviewed  URINALYSIS, COMPLETE (UACMP) WITH MICROSCOPIC - Abnormal; Notable for the following:       Result Value   Color, Urine YELLOW (*)    APPearance CLEAR (*)    Leukocytes, UA TRACE (*)    Squamous Epithelial / LPF 0-5 (*)    All other components within normal limits  LIPASE, BLOOD  COMPREHENSIVE METABOLIC PANEL  CBC  POC URINE PREG, ED  POCT PREGNANCY, URINE   ____________________________________________   EKG    ____________________________________________    RADIOLOGY  Ct Renal Stone Study  Result Date: 09/08/2016 CLINICAL DATA:  Patient passed multiple kidney stones this weekend. Hematuria, burning, and frequency of urination. EXAM: CT ABDOMEN AND PELVIS WITHOUT CONTRAST TECHNIQUE: Multidetector CT imaging of the abdomen and pelvis was performed following the standard protocol without IV contrast. COMPARISON:  CT abdomen and pelvis 06/26/2014. FINDINGS: Lower chest: No acute abnormality. Hepatobiliary: No focal liver abnormality is seen. Status post cholecystectomy. No biliary dilatation. Steatosis. Pancreas: Unremarkable. No pancreatic ductal dilatation or surrounding inflammatory changes. Spleen: Normal in size without focal abnormality. Adrenals/Urinary Tract: BILATERAL nephrolithiasis. A RIGHT renal calculus measures up to 8 mm in long-axis. There is no hydronephrosis. The bladder appears unremarkable.  Stomach/Bowel: Sequelae of gastric bypass surgery.  No obstruction. Vascular/Lymphatic: No significant vascular findings are present. No enlarged abdominal or pelvic lymph nodes. Reproductive: Uterus and bilateral adnexa are unremarkable. IUD appears in good position. Other: None Musculoskeletal: No acute or significant osseous findings. BILATERAL L5 spondylolysis with grade 1 spondylolisthesis. IMPRESSION: BILATERAL nephrolithiasis without hydronephrosis. Sequelae of gastric bypass surgery, without bowel obstruction. Electronically Signed   By: Elsie Stain M.D.   On: 09/08/2016 14:37    ____________________________________________   PROCEDURES Procedures  ____________________________________________   INITIAL IMPRESSION / ASSESSMENT AND PLAN / ED COURSE  Pertinent labs & imaging results that were available during my care of the patient were reviewed by me and considered in my medical decision making (see chart for details).  Patient presents with right flank pain, CT positive for 8 mm stone in the right kidney, likely the cause of her symptoms and her showering small stones. Due to the symptomatology, I did encourage her to follow up with urology for further monitoring of her symptoms and possible intervention. I counseled the patient to Take Ibuprofen and Tylenol Around-The-Clock and Provided a very limited oxycodone prescription for severe pain episodes.Considering the patient's symptoms, medical history, and physical examination today, I have low suspicion for cholecystitis or biliary pathology, pancreatitis, perforation or bowel obstruction, hernia, intra-abdominal abscess, AAA or dissection, volvulus or intussusception, mesenteric ischemia, or appendicitis.           ____________________________________________   FINAL CLINICAL IMPRESSION(S) / ED DIAGNOSES  Final diagnoses:  Right flank pain  Kidney stone      New Prescriptions   OXYCODONE (ROXICODONE) 5 MG IMMEDIATE  RELEASE TABLET    Take 1 tablet (5 mg total) by mouth every 6 (six) hours as needed for breakthrough pain.     Portions of this note were generated with dragon dictation software. Dictation errors may occur despite best attempts at proofreading.    Sharman Cheek, MD 09/08/16 1536

## 2016-09-09 ENCOUNTER — Encounter: Payer: Self-pay | Admitting: Urology

## 2016-09-09 ENCOUNTER — Telehealth: Payer: Self-pay | Admitting: Radiology

## 2016-09-09 ENCOUNTER — Ambulatory Visit (INDEPENDENT_AMBULATORY_CARE_PROVIDER_SITE_OTHER): Payer: 59 | Admitting: Urology

## 2016-09-09 VITALS — BP 142/78 | HR 59 | Ht 72.0 in | Wt 230.5 lb

## 2016-09-09 DIAGNOSIS — N200 Calculus of kidney: Secondary | ICD-10-CM

## 2016-09-09 DIAGNOSIS — R109 Unspecified abdominal pain: Secondary | ICD-10-CM | POA: Diagnosis not present

## 2016-09-09 DIAGNOSIS — R310 Gross hematuria: Secondary | ICD-10-CM

## 2016-09-09 NOTE — Telephone Encounter (Signed)
LMOM. Need to schedule pt for surgery.

## 2016-09-09 NOTE — Progress Notes (Signed)
09/09/2016 8:55 AM   Maria Fuller 11-Sep-1985 [id number]  Referring provider: [facility] [address] [place], [place] [address]  Chief Complaint  Patient presents with  . Nephrolithiasis    er referral for stone last seen 12/17    HPI: Patient is a 31 year old Caucasian female who is referred by [facility] ED for nephrolithiasis.  Patient states the onset of the pain was four days ago.   It was sharp, waxing and waning.  It lasted for several hours.  The pain was located right groin and radiated to right flank.  The pain was a 8/10.  Nothing made the pain better.   Nothing made the pain worse.  She did have gross hematuria, she had chills, vomiting and nausea.  She denies fevers.    In the ED, she was given oxycodone.  Her UA was unremarkable.  Her serum creatinine 0.44.  WBC count was 7.5.    CT Renal stone study performed on 09/08/2016 noted BILATERAL nephrolithiasis without hydronephrosis.  Sequelae of gastric bypass surgery, without bowel obstruction.  I have independently reviewed the films.    Today, she is experiencing frequency, dysuria, nocturia and gross hematuria.   She is still experiencing right flank pain.  4/10 pain today.  She states that she has been having a constant dull ache in her right flank since December.    Her UA today demonstrates 3- 10 RBC's.    She does have a prior history of stones.  She was straining her urine and actually noticed that she is passed 3 very small kidney stones prior to seeking treatment in the ED.  She has brought them in today and we will sent them for analysis.  She was also seen by Dr. Sherron Monday in 04/2016 for a right renal stone while pregnant.    PMH: Past Medical History:  Diagnosis Date  . Depression   . Diabetes mellitus without complication (HCC)    resolved with weight loss from gastric bypass  . Headache    migraines  . Hypertension    resolved since gastric bypass, severe  pre-eclampsia with G1  . Meningitis 2015  . PCOS (polycystic ovarian syndrome)   . Preterm labor     Surgical History: Past Surgical History:  Procedure Laterality Date  . CESAREAN SECTION    . CESAREAN SECTION N/A 05/07/2016   Procedure: CESAREAN SECTION;  Surgeon: Nadara Mustard, MD;  Location: [facility] ORS;  Service: Obstetrics;  Laterality: N/A;  Time of birth: 10:[birth year] Sex: Female Weight: 3940 kg, 8 lb 11 oz  . CHOLECYSTECTOMY    . GASTRIC BYPASS  04/2015  . TONSILLECTOMY      Home Medications:  Allergies as of 09/09/2016      Reactions   Cefdinir Rash   Penicillins Rash   Has patient had a PCN reaction causing immediate rash, facial/tongue/throat swelling, SOB or lightheadedness with hypotension: Yes Has patient had a PCN reaction causing severe rash involving mucus membranes or skin necrosis: No Has patient had a PCN reaction that required hospitalization No Has patient had a PCN reaction occurring within the last 10 years: No If all of the above answers are "NO", then may proceed with Cephalosporin use.      Medication List       Accurate as of 09/09/16  8:55 AM. Always use your most recent med list.          ibuprofen 600 MG tablet Commonly known  as:  ADVIL,MOTRIN Take 1 tablet (600 mg total) by mouth every 6 (six) hours as needed for mild pain or cramping.   oxyCODONE 5 MG immediate release tablet Commonly known as:  ROXICODONE Take 1 tablet (5 mg total) by mouth every 6 (six) hours as needed for breakthrough pain.   oxyCODONE-acetaminophen 5-325 MG tablet Commonly known as:  PERCOCET/ROXICET Take 2 tablets by mouth every 4 (four) hours as needed (pain scale > 7).   prenatal multivitamin Tabs tablet Take 1 tablet by mouth daily at 12 noon.   sertraline 100 MG tablet Commonly known as:  ZOLOFT Take 200 mg by mouth daily.       Allergies:  Allergies  Allergen Reactions  . Cefdinir Rash  . Penicillins Rash    Has patient had a PCN reaction causing immediate  rash, facial/tongue/throat swelling, SOB or lightheadedness with hypotension: Yes Has patient had a PCN reaction causing severe rash involving mucus membranes or skin necrosis: No Has patient had a PCN reaction that required hospitalization No Has patient had a PCN reaction occurring within the last 10 years: No If all of the above answers are "NO", then may proceed with Cephalosporin use.    Family History: Family History  Problem Relation Age of Onset  . Polycystic ovary syndrome Mother   . Diabetes Father   . Endometriosis Sister   . Polycystic ovary syndrome Sister   . Breast cancer Paternal Grandmother   . Diabetes Paternal Grandmother   . Diabetes Paternal Grandfather   . Kidney cancer Neg Hx   . Prostate cancer Neg Hx   . Bladder Cancer Neg Hx     Social History:  reports that she has never smoked. She has never used smokeless tobacco. She reports that she does not drink alcohol or use drugs.  ROS: UROLOGY Frequent Urination?: Yes Hard to postpone urination?: No Burning/pain with urination?: Yes Get up at night to urinate?: Yes Leakage of urine?: No Urine stream starts and stops?: No Trouble starting stream?: No Do you have to strain to urinate?: No Blood in urine?: Yes Urinary tract infection?: No Sexually transmitted disease?: No Injury to kidneys or bladder?: No Painful intercourse?: Yes Weak stream?: No Currently pregnant?: No Vaginal bleeding?: No Last menstrual period?: N  Gastrointestinal Nausea?: Yes Vomiting?: No Indigestion/heartburn?: No Diarrhea?: No Constipation?: No  Constitutional Fever: No Night sweats?: No Weight loss?: No Fatigue?: Yes  Skin Skin rash/lesions?: No Itching?: No  Eyes Blurred vision?: No Double vision?: No  Ears/Nose/Throat Sore throat?: No Sinus problems?: No  Hematologic/Lymphatic Swollen glands?: No Easy bruising?: No  Cardiovascular Leg swelling?: No Chest pain?: No  Respiratory Cough?:  No Shortness of breath?: No  Endocrine Excessive thirst?: No  Musculoskeletal Back pain?: No Joint pain?: No  Neurological Headaches?: No Dizziness?: Yes  Psychologic Depression?: No Anxiety?: Yes  Physical Exam: BP (!) 142/78   Pulse (!) 59   Ht 6' (1.829 m)   Wt 230 lb 8 oz (104.6 kg)   BMI 31.26 kg/m   Constitutional: Well nourished. Alert and oriented, No acute distress. HEENT: [place] AT, moist mucus membranes. Trachea midline, no masses. Cardiovascular: No clubbing, cyanosis, or edema. Respiratory: Normal respiratory effort, no increased work of breathing. GI: Abdomen is soft, non tender, non distended, no abdominal masses. Liver and spleen not palpable.  No hernias appreciated.  Stool sample for occult testing is not indicated.   GU: No CVA tenderness.  No bladder fullness or masses.   Skin: No rashes, bruises or  suspicious lesions. Lymph: No cervical or inguinal adenopathy. Neurologic: Grossly intact, no focal deficits, moving all 4 extremities. Psychiatric: Normal mood and affect.  Laboratory Data: Lab Results  Component Value Date   WBC 7.5 09/08/2016   HGB 12.9 09/08/2016   HCT 38.2 09/08/2016   MCV 89.3 09/08/2016   PLT 385 09/08/2016    Lab Results  Component Value Date   CREATININE 0.44 09/08/2016     Lab Results  Component Value Date   HGBA1C 4.9 10/06/2015    Lab Results  Component Value Date   AST 17 09/08/2016   Lab Results  Component Value Date   ALT 18 09/08/2016    Urinalysis 3-10RBC's.  See EPIC.  Pertinent Imaging: CLINICAL DATA:  Patient passed multiple kidney stones this weekend. Hematuria, burning, and frequency of urination.  EXAM: CT ABDOMEN AND PELVIS WITHOUT CONTRAST  TECHNIQUE: Multidetector CT imaging of the abdomen and pelvis was performed following the standard protocol without IV contrast.  COMPARISON:  CT abdomen and pelvis 06/26/2014.  FINDINGS: Lower chest: No acute  abnormality.  Hepatobiliary: No focal liver abnormality is seen. Status post cholecystectomy. No biliary dilatation. Steatosis.  Pancreas: Unremarkable. No pancreatic ductal dilatation or surrounding inflammatory changes.  Spleen: Normal in size without focal abnormality.  Adrenals/Urinary Tract: BILATERAL nephrolithiasis. A RIGHT renal calculus measures up to 8 mm in long-axis. There is no hydronephrosis. The bladder appears unremarkable.  Stomach/Bowel: Sequelae of gastric bypass surgery.  No obstruction.  Vascular/Lymphatic: No significant vascular findings are present. No enlarged abdominal or pelvic lymph nodes.  Reproductive: Uterus and bilateral adnexa are unremarkable. IUD appears in good position.  Other: None  Musculoskeletal: No acute or significant osseous findings. BILATERAL L5 spondylolysis with grade 1 spondylolisthesis.  IMPRESSION: BILATERAL nephrolithiasis without hydronephrosis.  Sequelae of gastric bypass surgery, without bowel obstruction.   Electronically Signed   By: Elsie Stain M.D.   On: 09/08/2016 14:37  Assessment & Plan:    1. Right renal stone  - offered patient ESWL and URS/LL/ureteral stent placement for definitive management of her renal stone  - schedule RIGHT ureteroscopy with laser lithotripsy and ureteral stent placement  - explained to the patient how the procedure is performed and the risks involved  - informed patient that they will have a stent placed during the procedure and will remain in place after the procedure for a short time.   - stent may be removed in the office with a cystoscope or patient may be instructed to remove the stent themselves by the string  - described "stent pain" as feelings of needing to urinate/overactive bladder and a warm, tingling sensation to intense pain in the affected flank  - residual stones within the kidney or ureter may be present after the procedure and may need to have these  addressed at a different encounter  - injury to the ureter is the most common intra-operative risk, it may result in an open procedure to correct the defect  - infection and bleeding are also risks  - explained the risks of general anesthesia, such as: MI, CVA, paralysis, coma and/or death.  - advised to contact our office or seek treatment in the ED if becomes febrile or pain/ vomiting are difficult control in order to arrange for emergent/urgent intervention  - UA  - Urine culture  2. Right flank pain  - see above  3. Left renal stones  - not causing discomfort at this time  - may need to be addressed at a later  date  4. Gross hematuria  - UA today demonstrates 3-10 RBC's.    - continue to monitor the patient's UA after the treatment/passage of the stone to ensure the hematuria has resolved  - if hematuria persists, we will pursue a hematuria workup with CT Urogram and cystoscopy if appropriate.    Return for right URS/LL/.ureteral stent placement.  These notes generated with voice recognition software. I apologize for typographical errors.  Michiel Cowboy, PA-C  [facility] [address] [place] [address] [place], [place] [address] [phone]

## 2016-09-10 ENCOUNTER — Other Ambulatory Visit: Payer: Self-pay | Admitting: Radiology

## 2016-09-10 ENCOUNTER — Encounter: Payer: Self-pay | Admitting: Radiology

## 2016-09-10 DIAGNOSIS — N200 Calculus of kidney: Secondary | ICD-10-CM

## 2016-09-10 LAB — MICROSCOPIC EXAMINATION: Bacteria, UA: NONE SEEN

## 2016-09-10 LAB — URINALYSIS, COMPLETE
Bilirubin, UA: NEGATIVE
GLUCOSE, UA: NEGATIVE
Ketones, UA: NEGATIVE
Nitrite, UA: NEGATIVE
PROTEIN UA: NEGATIVE
RBC UA: NEGATIVE
Specific Gravity, UA: 1.02 (ref 1.005–1.030)
UUROB: 0.2 mg/dL (ref 0.2–1.0)
pH, UA: 5.5 (ref 5.0–7.5)

## 2016-09-10 NOTE — Telephone Encounter (Signed)
Notified pt of surgery scheduled with Dr Sherryl BartersBudzyn on 09/19/16, pre-admit testing appt & to call day prior to surgery for arrival time to SDS. Pt voices understanding.  Pt reports passing several small stones. Michiel CowboyShannon McGowan aware.

## 2016-09-11 ENCOUNTER — Encounter
Admission: RE | Admit: 2016-09-11 | Discharge: 2016-09-11 | Disposition: A | Discharge status: Home / Self Care | Payer: Managed Care, Other (non HMO) | Source: Ambulatory Visit | Attending: Urology | Admitting: Urology

## 2016-09-11 DIAGNOSIS — Z01818 Encounter for other preprocedural examination: Secondary | ICD-10-CM | POA: Insufficient documentation

## 2016-09-11 DIAGNOSIS — E119 Type 2 diabetes mellitus without complications: Secondary | ICD-10-CM | POA: Insufficient documentation

## 2016-09-11 DIAGNOSIS — N200 Calculus of kidney: Secondary | ICD-10-CM | POA: Diagnosis present

## 2016-09-11 LAB — CULTURE, URINE COMPREHENSIVE

## 2016-09-11 NOTE — Patient Instructions (Signed)
Your procedure is scheduled on: Sep 19, 2016 Su procedimiento est programado para: Report to 2ND FLOOR OF MEDICAL MALL COME THRU THE MEDICAL MALL Presntese a: To find out your arrival time please call [phone] between 1PM - 3PM on Sep 18, 2016 Thursday . Para saber su hora de llegada por favor llame al [phone] entre la 1PM - 3PM el da:  Remember: Instructions that are not followed completely may result in serious medical risk, up to and including death, or upon the discretion of your surgeon and anesthesiologist your surgery may need to be rescheduled.  Recuerde: Las instrucciones que no se siguen completamente Armed forces logistics/support/administrative officerpueden resultar en un riesgo de salud grave, incluyendo hasta la Oraclemuerte o a discrecin de su cirujano y Scientific laboratory techniciananestesilogo, su ciruga se puede posponer.   __X__ 1. Do not eat food or drink liquids after midnight. No gum chewing or hard candies.  No coma alimentos ni tome lquidos despus de la medianoche.  No mastique chicle ni caramelos  duros.     __X__ 2. No alcohol for 24 hours before or after surgery.    No tome alcohol durante las 24 horas antes ni despus de la [place].   __X__ 3. Bring all medications with you on the day of surgery if instructed. BRING ANY NEW MEDICATIONS TO HOSPITAL    Lleve todos los medicamentos con usted el da de su ciruga si se le ha indicado as.   __X__ 4. Notify your doctor if there is any change in your medical condition (cold, fever,                             infections).    Informe a su mdico si hay algn cambio en su condicin mdica (resfriado, fiebre, infecciones).   Do not wear jewelry, make-up, hairpins, clips or nail polish.  No use joyas, maquillajes, pinzas/ganchos para el cabello ni esmalte de uas.  Do not wear lotions, powders, or perfumes. You may NOT wear deodorant.  No use lociones, polvos o perfumes.  Puede usar desodorante.    Do not shave 48 hours prior to surgery. Men may shave face and neck.  No se afeite 48  horas antes de la [place].  Los hombres pueden Commercial Metals Companyafeitarse la cara y el cuello.   Do not bring valuables to the hospital.   No lleve objetos de valor al hospital.  [facility] is not responsible for any belongings or valuables.  [facility] [place] no se hace responsable de ningn tipo de pertenencias u objetos de Licensed conveyancervalor.               Contacts, dentures or bridgework may not be worn into surgery.  Los lentes de Smithlandcontacto, las dentaduras postizas o puentes no se pueden usar en la [place].  Leave your suitcase in the car. After surgery it may be brought to your room.  Deje su maleta en el auto.  Despus de la ciruga podr traerla a su habitacin.  For patients admitted to the hospital, discharge time is determined by your treatment team.  Para los pacientes que sean ingresados al hospital, el tiempo en el cual se le dar de alta es determinado por su                equipo de Quincytratamiento.   Patients discharged the day of surgery will not be allowed to drive home. A los pacientes que se les da de alta el mismo da de  la ciruga no se les permitir conducir a casa.   Please read over the following fact sheets that you were given: Por favor lea las siguientes hojas de informacin que le dieron:      __X__ Take these medicines the morning of surgery with A SIP OF WATER:          [employer] medicinas la maana de la ciruga con UN SORBO DE AGUA:  1. NONE  2.   3.   4.       5.  6.  ____ Fleet Enema (as directed)          Enema de Fleet (segn lo indicado)    ____ Use CHG Soap as directed          Utilice el jabn de CHG segn lo indicado  ____ Use inhalers on the day of surgery          Use los inhaladores el da de la ciruga  ____ Stop metformin 2 days prior to surgery          Deje de tomar el metformin 2 das antes de la ciruga    ____ Take 1/2 of usual insulin dose the night before surgery and none on the morning of surgery           Tome la mitad de la dosis habitual de insulina la  noche antes de la [place] y no tome nada en la maana de la             ciruga  ____ Stop Coumadin/Plavix/aspirin on           Deje de tomar el Coumadin/Plavix/aspirina el da:  _X___ Stop Anti-inflammatories UNTIL AFTER SURGERY IBUPROFEN ADVIL ALEVE MOTRIN, ANAPROX ,GOODY'S POWDER - USE TYLENOL FOR PAIN          Deje de tomar antiinflamatorios el da:   ____ Stop supplements until after surgery            Deje de tomar suplementos hasta despus de la ciruga  ____ Bring C-Pap to the hospital          Lleve el C-Pap al hospital

## 2016-09-12 ENCOUNTER — Other Ambulatory Visit: Payer: Self-pay | Admitting: Urology

## 2016-09-18 MED ORDER — CIPROFLOXACIN IN D5W 400 MG/200ML IV SOLN
400.0000 mg | INTRAVENOUS | Status: AC
  Administered 2016-09-19: 400 mg via INTRAVENOUS

## 2016-09-19 ENCOUNTER — Ambulatory Visit: Payer: 59 | Admitting: Anesthesiology

## 2016-09-19 ENCOUNTER — Encounter: Payer: Self-pay | Admitting: *Deleted

## 2016-09-19 ENCOUNTER — Encounter
Admission: RE | Disposition: A | Discharge status: Home / Self Care | Payer: Self-pay | Source: Ambulatory Visit | Attending: Urology

## 2016-09-19 ENCOUNTER — Ambulatory Visit
Admission: RE | Admit: 2016-09-19 | Discharge: 2016-09-19 | Disposition: A | Discharge status: Home / Self Care | Payer: 59 | Source: Ambulatory Visit | Attending: Urology | Admitting: Urology

## 2016-09-19 ENCOUNTER — Telehealth: Payer: Self-pay | Admitting: Urology

## 2016-09-19 DIAGNOSIS — Z87442 Personal history of urinary calculi: Secondary | ICD-10-CM | POA: Insufficient documentation

## 2016-09-19 DIAGNOSIS — Z79899 Other long term (current) drug therapy: Secondary | ICD-10-CM | POA: Insufficient documentation

## 2016-09-19 DIAGNOSIS — I10 Essential (primary) hypertension: Secondary | ICD-10-CM | POA: Insufficient documentation

## 2016-09-19 DIAGNOSIS — N200 Calculus of kidney: Secondary | ICD-10-CM | POA: Diagnosis not present

## 2016-09-19 DIAGNOSIS — N132 Hydronephrosis with renal and ureteral calculous obstruction: Secondary | ICD-10-CM | POA: Diagnosis not present

## 2016-09-19 DIAGNOSIS — R1031 Right lower quadrant pain: Secondary | ICD-10-CM | POA: Diagnosis not present

## 2016-09-19 DIAGNOSIS — Z9884 Bariatric surgery status: Secondary | ICD-10-CM | POA: Diagnosis not present

## 2016-09-19 DIAGNOSIS — R310 Gross hematuria: Secondary | ICD-10-CM | POA: Insufficient documentation

## 2016-09-19 DIAGNOSIS — E119 Type 2 diabetes mellitus without complications: Secondary | ICD-10-CM | POA: Insufficient documentation

## 2016-09-19 DIAGNOSIS — F329 Major depressive disorder, single episode, unspecified: Secondary | ICD-10-CM | POA: Insufficient documentation

## 2016-09-19 HISTORY — PX: URETEROSCOPY WITH HOLMIUM LASER LITHOTRIPSY: SHX6645

## 2016-09-19 HISTORY — PX: CYSTOSCOPY WITH STENT PLACEMENT: SHX5790

## 2016-09-19 LAB — POCT PREGNANCY, URINE: PREG TEST UR: NEGATIVE

## 2016-09-19 LAB — GLUCOSE, CAPILLARY: GLUCOSE-CAPILLARY: 75 mg/dL (ref 65–99)

## 2016-09-19 SURGERY — URETEROSCOPY, WITH LITHOTRIPSY USING HOLMIUM LASER
Anesthesia: General | Site: Ureter | Laterality: Right | Wound class: Clean Contaminated

## 2016-09-19 MED ORDER — LIDOCAINE HCL (PF) 2 % IJ SOLN
INTRAMUSCULAR | Status: AC
  Filled 2016-09-19: qty 2

## 2016-09-19 MED ORDER — CIPROFLOXACIN IN D5W 400 MG/200ML IV SOLN
INTRAVENOUS | Status: AC
  Administered 2016-09-19: 400 mg via INTRAVENOUS
  Filled 2016-09-19: qty 200

## 2016-09-19 MED ORDER — SCOPOLAMINE 1 MG/3DAYS TD PT72
MEDICATED_PATCH | TRANSDERMAL | Status: AC
  Filled 2016-09-19: qty 1

## 2016-09-19 MED ORDER — DIPHENHYDRAMINE HCL 50 MG/ML IJ SOLN
INTRAMUSCULAR | Status: DC | PRN
  Administered 2016-09-19: 50 mg via INTRAVENOUS

## 2016-09-19 MED ORDER — SCOPOLAMINE 1 MG/3DAYS TD PT72
1.0000 | MEDICATED_PATCH | Freq: Once | TRANSDERMAL | Status: DC
  Administered 2016-09-19: 1.5 mg via TRANSDERMAL

## 2016-09-19 MED ORDER — SUGAMMADEX SODIUM 200 MG/2ML IV SOLN
INTRAVENOUS | Status: DC | PRN
  Administered 2016-09-19: 200 mg via INTRAVENOUS

## 2016-09-19 MED ORDER — DEXAMETHASONE SODIUM PHOSPHATE 10 MG/ML IJ SOLN
INTRAMUSCULAR | Status: AC
  Filled 2016-09-19: qty 1

## 2016-09-19 MED ORDER — LACTATED RINGERS IV SOLN
INTRAVENOUS | Status: DC | PRN
  Administered 2016-09-19: 09:00:00 via INTRAVENOUS

## 2016-09-19 MED ORDER — LIDOCAINE HCL (CARDIAC) 20 MG/ML IV SOLN
INTRAVENOUS | Status: DC | PRN
  Administered 2016-09-19: 100 mg via INTRAVENOUS
  Administered 2016-09-19: 160 mg via INTRAVENOUS

## 2016-09-19 MED ORDER — MIDAZOLAM HCL 2 MG/2ML IJ SOLN
INTRAMUSCULAR | Status: AC
  Filled 2016-09-19: qty 2

## 2016-09-19 MED ORDER — CEFAZOLIN SODIUM-DEXTROSE 2-4 GM/100ML-% IV SOLN
INTRAVENOUS | Status: AC
  Filled 2016-09-19: qty 100

## 2016-09-19 MED ORDER — FAMOTIDINE 20 MG PO TABS
20.0000 mg | ORAL_TABLET | Freq: Once | ORAL | Status: AC
  Administered 2016-09-19: 20 mg via ORAL

## 2016-09-19 MED ORDER — FAMOTIDINE 20 MG PO TABS
ORAL_TABLET | ORAL | Status: AC
  Administered 2016-09-19: 20 mg via ORAL
  Filled 2016-09-19: qty 1

## 2016-09-19 MED ORDER — ROCURONIUM BROMIDE 100 MG/10ML IV SOLN
INTRAVENOUS | Status: DC | PRN
  Administered 2016-09-19: 50 mg via INTRAVENOUS

## 2016-09-19 MED ORDER — PHENYLEPHRINE HCL 10 MG/ML IJ SOLN
INTRAMUSCULAR | Status: AC
  Filled 2016-09-19: qty 1

## 2016-09-19 MED ORDER — CIPROFLOXACIN HCL 500 MG PO TABS: 500.0000 mg | ORAL_TABLET | Freq: Two times a day (BID) | ORAL | 0 refills | Status: DC

## 2016-09-19 MED ORDER — MIDAZOLAM HCL 2 MG/2ML IJ SOLN
INTRAMUSCULAR | Status: DC | PRN
  Administered 2016-09-19: 2 mg via INTRAVENOUS

## 2016-09-19 MED ORDER — SUGAMMADEX SODIUM 200 MG/2ML IV SOLN
INTRAVENOUS | Status: AC
  Filled 2016-09-19: qty 2

## 2016-09-19 MED ORDER — SODIUM CHLORIDE 0.9 % IV SOLN
INTRAVENOUS | Status: DC
  Administered 2016-09-19: 50 mL/h via INTRAVENOUS

## 2016-09-19 MED ORDER — FENTANYL CITRATE (PF) 100 MCG/2ML IJ SOLN
INTRAMUSCULAR | Status: DC | PRN
  Administered 2016-09-19: 100 ug via INTRAVENOUS

## 2016-09-19 MED ORDER — DEXAMETHASONE SODIUM PHOSPHATE 10 MG/ML IJ SOLN
INTRAMUSCULAR | Status: DC | PRN
  Administered 2016-09-19: 10 mg via INTRAVENOUS

## 2016-09-19 MED ORDER — PROPOFOL 10 MG/ML IV BOLUS
INTRAVENOUS | Status: AC
  Filled 2016-09-19: qty 20

## 2016-09-19 MED ORDER — FENTANYL CITRATE (PF) 100 MCG/2ML IJ SOLN
25.0000 ug | INTRAMUSCULAR | Status: DC | PRN
  Administered 2016-09-19 (×3): 50 ug via INTRAVENOUS

## 2016-09-19 MED ORDER — GLYCOPYRROLATE 0.2 MG/ML IJ SOLN
INTRAMUSCULAR | Status: DC | PRN
  Administered 2016-09-19: 0.2 mg via INTRAVENOUS

## 2016-09-19 MED ORDER — ONDANSETRON HCL 4 MG/2ML IJ SOLN
INTRAMUSCULAR | Status: DC | PRN
  Administered 2016-09-19: 4 mg via INTRAVENOUS

## 2016-09-19 MED ORDER — OXYCODONE-ACETAMINOPHEN 5-325 MG PO TABS
ORAL_TABLET | ORAL | Status: AC
  Filled 2016-09-19: qty 1

## 2016-09-19 MED ORDER — ROCURONIUM BROMIDE 50 MG/5ML IV SOLN
INTRAVENOUS | Status: AC
  Filled 2016-09-19: qty 1

## 2016-09-19 MED ORDER — IOTHALAMATE MEGLUMINE 43 % IV SOLN
INTRAVENOUS | Status: DC | PRN
  Administered 2016-09-19: 15 mL via URETHRAL

## 2016-09-19 MED ORDER — OXYCODONE-ACETAMINOPHEN 5-325 MG PO TABS: 2.0000 | ORAL_TABLET | ORAL | 0 refills | Status: DC | PRN

## 2016-09-19 MED ORDER — ONDANSETRON HCL 4 MG/2ML IJ SOLN
INTRAMUSCULAR | Status: AC
  Filled 2016-09-19: qty 2

## 2016-09-19 MED ORDER — SUCCINYLCHOLINE CHLORIDE 20 MG/ML IJ SOLN
INTRAMUSCULAR | Status: AC
Start: 2016-09-19 — End: 2016-09-19
  Filled 2016-09-19: qty 1

## 2016-09-19 MED ORDER — FENTANYL CITRATE (PF) 100 MCG/2ML IJ SOLN
INTRAMUSCULAR | Status: AC
  Administered 2016-09-19: 50 ug via INTRAVENOUS
  Filled 2016-09-19: qty 2

## 2016-09-19 MED ORDER — OXYCODONE-ACETAMINOPHEN 5-325 MG PO TABS
1.0000 | ORAL_TABLET | Freq: Once | ORAL | Status: AC
  Administered 2016-09-19: 1 via ORAL

## 2016-09-19 MED ORDER — FENTANYL CITRATE (PF) 100 MCG/2ML IJ SOLN
INTRAMUSCULAR | Status: AC
  Filled 2016-09-19: qty 2

## 2016-09-19 MED ORDER — SUCCINYLCHOLINE CHLORIDE 20 MG/ML IJ SOLN
INTRAMUSCULAR | Status: DC | PRN
  Administered 2016-09-19: 120 mg via INTRAVENOUS

## 2016-09-19 MED ORDER — PROMETHAZINE HCL 25 MG/ML IJ SOLN
6.2500 mg | INTRAMUSCULAR | Status: DC | PRN
Start: 2016-09-19 — End: 2016-09-19

## 2016-09-19 SURGICAL SUPPLY — 28 items
BACTOSHIELD CHG 4% 4OZ (MISCELLANEOUS) ×2
BASKET ZERO TIP 1.9FR (BASKET) IMPLANT
CATH URETL 5X70 OPEN END (CATHETERS) ×3 IMPLANT
CNTNR SPEC 2.5X3XGRAD LEK (MISCELLANEOUS) ×1
CONT SPEC 4OZ STER OR WHT (MISCELLANEOUS) ×2
CONTAINER SPEC 2.5X3XGRAD LEK (MISCELLANEOUS) ×1 IMPLANT
FIBER LASER LITHO 273 (Laser) ×3 IMPLANT
GLOVE BIO SURGEON STRL SZ7 (GLOVE) ×3 IMPLANT
GLOVE BIO SURGEON STRL SZ7.5 (GLOVE) ×3 IMPLANT
GOWN STRL REUS W/ TWL LRG LVL4 (GOWN DISPOSABLE) ×1 IMPLANT
GOWN STRL REUS W/TWL LRG LVL4 (GOWN DISPOSABLE) ×2
GOWN STRL REUS W/TWL XL LVL3 (GOWN DISPOSABLE) ×3 IMPLANT
GUIDEWIRE SUPER STIFF (WIRE) IMPLANT
INTRODUCER DILATOR DOUBLE (INTRODUCER) ×3 IMPLANT
KIT RM TURNOVER CYSTO AR (KITS) ×3 IMPLANT
PACK CYSTO AR (MISCELLANEOUS) ×3 IMPLANT
SCRUB CHG 4% DYNA-HEX 4OZ (MISCELLANEOUS) ×1 IMPLANT
SENSORWIRE 0.038 NOT ANGLED (WIRE) ×6
SET CYSTO W/LG BORE CLAMP LF (SET/KITS/TRAYS/PACK) ×3 IMPLANT
SHEATH URETERAL 13/15X36 1L (SHEATH) ×3 IMPLANT
SOL .9 NS 3000ML IRR  AL (IV SOLUTION) ×2
SOL .9 NS 3000ML IRR UROMATIC (IV SOLUTION) ×1 IMPLANT
STENT URET 6FRX24 CONTOUR (STENTS) IMPLANT
STENT URET 6FRX26 CONTOUR (STENTS) ×3 IMPLANT
SURGILUBE 2OZ TUBE FLIPTOP (MISCELLANEOUS) ×3 IMPLANT
SYRINGE IRR TOOMEY STRL 70CC (SYRINGE) ×3 IMPLANT
WATER STERILE IRR 1000ML POUR (IV SOLUTION) ×3 IMPLANT
WIRE SENSOR 0.038 NOT ANGLED (WIRE) ×2 IMPLANT

## 2016-09-19 NOTE — Anesthesia Post-op Follow-up Note (Cosign Needed)
Anesthesia QCDR form completed.        

## 2016-09-19 NOTE — Transfer of Care (Signed)
Immediate Anesthesia Transfer of Care Note  Patient: Sabino Dickatasha K Goatley  Procedure(s) Performed: Procedure(s): URETEROSCOPY WITH HOLMIUM LASER LITHOTRIPSY (Right) CYSTOSCOPY WITH STENT PLACEMENT (Right)  Patient Location: PACU  Anesthesia Type:General  Level of Consciousness: sedated  Airway & Oxygen Therapy: Patient Spontanous Breathing and Patient connected to face mask oxygen  Post-op Assessment: Report given to RN and Post -op Vital signs reviewed and stable  Post vital signs: Reviewed and stable  Last Vitals:  Vitals:   09/19/16 0802  BP: (!) 113/55  Pulse: (!) 53  Resp: 17  Temp: 36.6 C    Last Pain:  Vitals:   09/19/16 0802  TempSrc: Oral      Patients Stated Pain Goal: 0 (09/19/16 0802)  Complications: No apparent anesthesia complications

## 2016-09-19 NOTE — H&P (View-Only) (Signed)
09/09/2016 8:55 AM   Maria Fuller 11-Sep-1985 [id number]  Referring provider: [facility] [address] [place], [place] [address]  Chief Complaint  Patient presents with  . Nephrolithiasis    er referral for stone last seen 12/17    HPI: Patient is a 31 year old Caucasian female who is referred by [facility] ED for nephrolithiasis.  Patient states the onset of the pain was four days ago.   It was sharp, waxing and waning.  It lasted for several hours.  The pain was located right groin and radiated to right flank.  The pain was a 8/10.  Nothing made the pain better.   Nothing made the pain worse.  She did have gross hematuria, she had chills, vomiting and nausea.  She denies fevers.    In the ED, she was given oxycodone.  Her UA was unremarkable.  Her serum creatinine 0.44.  WBC count was 7.5.    CT Renal stone study performed on 09/08/2016 noted BILATERAL nephrolithiasis without hydronephrosis.  Sequelae of gastric bypass surgery, without bowel obstruction.  I have independently reviewed the films.    Today, she is experiencing frequency, dysuria, nocturia and gross hematuria.   She is still experiencing right flank pain.  4/10 pain today.  She states that she has been having a constant dull ache in her right flank since December.    Her UA today demonstrates 3- 10 RBC's.    She does have a prior history of stones.  She was straining her urine and actually noticed that she is passed 3 very small kidney stones prior to seeking treatment in the ED.  She has brought them in today and we will sent them for analysis.  She was also seen by Dr. Sherron Monday in 04/2016 for a right renal stone while pregnant.    PMH: Past Medical History:  Diagnosis Date  . Depression   . Diabetes mellitus without complication (HCC)    resolved with weight loss from gastric bypass  . Headache    migraines  . Hypertension    resolved since gastric bypass, severe  pre-eclampsia with G1  . Meningitis 2015  . PCOS (polycystic ovarian syndrome)   . Preterm labor     Surgical History: Past Surgical History:  Procedure Laterality Date  . CESAREAN SECTION    . CESAREAN SECTION N/A 05/07/2016   Procedure: CESAREAN SECTION;  Surgeon: Nadara Mustard, MD;  Location: [facility] ORS;  Service: Obstetrics;  Laterality: N/A;  Time of birth: 10:[birth year] Sex: Female Weight: 3940 kg, 8 lb 11 oz  . CHOLECYSTECTOMY    . GASTRIC BYPASS  04/2015  . TONSILLECTOMY      Home Medications:  Allergies as of 09/09/2016      Reactions   Cefdinir Rash   Penicillins Rash   Has patient had a PCN reaction causing immediate rash, facial/tongue/throat swelling, SOB or lightheadedness with hypotension: Yes Has patient had a PCN reaction causing severe rash involving mucus membranes or skin necrosis: No Has patient had a PCN reaction that required hospitalization No Has patient had a PCN reaction occurring within the last 10 years: No If all of the above answers are "NO", then may proceed with Cephalosporin use.      Medication List       Accurate as of 09/09/16  8:55 AM. Always use your most recent med list.          ibuprofen 600 MG tablet Commonly known  as:  ADVIL,MOTRIN Take 1 tablet (600 mg total) by mouth every 6 (six) hours as needed for mild pain or cramping.   oxyCODONE 5 MG immediate release tablet Commonly known as:  ROXICODONE Take 1 tablet (5 mg total) by mouth every 6 (six) hours as needed for breakthrough pain.   oxyCODONE-acetaminophen 5-325 MG tablet Commonly known as:  PERCOCET/ROXICET Take 2 tablets by mouth every 4 (four) hours as needed (pain scale > 7).   prenatal multivitamin Tabs tablet Take 1 tablet by mouth daily at 12 noon.   sertraline 100 MG tablet Commonly known as:  ZOLOFT Take 200 mg by mouth daily.       Allergies:  Allergies  Allergen Reactions  . Cefdinir Rash  . Penicillins Rash    Has patient had a PCN reaction causing immediate  rash, facial/tongue/throat swelling, SOB or lightheadedness with hypotension: Yes Has patient had a PCN reaction causing severe rash involving mucus membranes or skin necrosis: No Has patient had a PCN reaction that required hospitalization No Has patient had a PCN reaction occurring within the last 10 years: No If all of the above answers are "NO", then may proceed with Cephalosporin use.    Family History: Family History  Problem Relation Age of Onset  . Polycystic ovary syndrome Mother   . Diabetes Father   . Endometriosis Sister   . Polycystic ovary syndrome Sister   . Breast cancer Paternal Grandmother   . Diabetes Paternal Grandmother   . Diabetes Paternal Grandfather   . Kidney cancer Neg Hx   . Prostate cancer Neg Hx   . Bladder Cancer Neg Hx     Social History:  reports that she has never smoked. She has never used smokeless tobacco. She reports that she does not drink alcohol or use drugs.  ROS: UROLOGY Frequent Urination?: Yes Hard to postpone urination?: No Burning/pain with urination?: Yes Get up at night to urinate?: Yes Leakage of urine?: No Urine stream starts and stops?: No Trouble starting stream?: No Do you have to strain to urinate?: No Blood in urine?: Yes Urinary tract infection?: No Sexually transmitted disease?: No Injury to kidneys or bladder?: No Painful intercourse?: Yes Weak stream?: No Currently pregnant?: No Vaginal bleeding?: No Last menstrual period?: N  Gastrointestinal Nausea?: Yes Vomiting?: No Indigestion/heartburn?: No Diarrhea?: No Constipation?: No  Constitutional Fever: No Night sweats?: No Weight loss?: No Fatigue?: Yes  Skin Skin rash/lesions?: No Itching?: No  Eyes Blurred vision?: No Double vision?: No  Ears/Nose/Throat Sore throat?: No Sinus problems?: No  Hematologic/Lymphatic Swollen glands?: No Easy bruising?: No  Cardiovascular Leg swelling?: No Chest pain?: No  Respiratory Cough?:  No Shortness of breath?: No  Endocrine Excessive thirst?: No  Musculoskeletal Back pain?: No Joint pain?: No  Neurological Headaches?: No Dizziness?: Yes  Psychologic Depression?: No Anxiety?: Yes  Physical Exam: BP (!) 142/78   Pulse (!) 59   Ht 6' (1.829 m)   Wt 230 lb 8 oz (104.6 kg)   BMI 31.26 kg/m   Constitutional: Well nourished. Alert and oriented, No acute distress. HEENT: [place] AT, moist mucus membranes. Trachea midline, no masses. Cardiovascular: No clubbing, cyanosis, or edema. Respiratory: Normal respiratory effort, no increased work of breathing. GI: Abdomen is soft, non tender, non distended, no abdominal masses. Liver and spleen not palpable.  No hernias appreciated.  Stool sample for occult testing is not indicated.   GU: No CVA tenderness.  No bladder fullness or masses.   Skin: No rashes, bruises or  suspicious lesions. Lymph: No cervical or inguinal adenopathy. Neurologic: Grossly intact, no focal deficits, moving all 4 extremities. Psychiatric: Normal mood and affect.  Laboratory Data: Lab Results  Component Value Date   WBC 7.5 09/08/2016   HGB 12.9 09/08/2016   HCT 38.2 09/08/2016   MCV 89.3 09/08/2016   PLT 385 09/08/2016    Lab Results  Component Value Date   CREATININE 0.44 09/08/2016     Lab Results  Component Value Date   HGBA1C 4.9 10/06/2015    Lab Results  Component Value Date   AST 17 09/08/2016   Lab Results  Component Value Date   ALT 18 09/08/2016    Urinalysis 3-10RBC's.  See EPIC.  Pertinent Imaging: CLINICAL DATA:  Patient passed multiple kidney stones this weekend. Hematuria, burning, and frequency of urination.  EXAM: CT ABDOMEN AND PELVIS WITHOUT CONTRAST  TECHNIQUE: Multidetector CT imaging of the abdomen and pelvis was performed following the standard protocol without IV contrast.  COMPARISON:  CT abdomen and pelvis 06/26/2014.  FINDINGS: Lower chest: No acute  abnormality.  Hepatobiliary: No focal liver abnormality is seen. Status post cholecystectomy. No biliary dilatation. Steatosis.  Pancreas: Unremarkable. No pancreatic ductal dilatation or surrounding inflammatory changes.  Spleen: Normal in size without focal abnormality.  Adrenals/Urinary Tract: BILATERAL nephrolithiasis. A RIGHT renal calculus measures up to 8 mm in long-axis. There is no hydronephrosis. The bladder appears unremarkable.  Stomach/Bowel: Sequelae of gastric bypass surgery.  No obstruction.  Vascular/Lymphatic: No significant vascular findings are present. No enlarged abdominal or pelvic lymph nodes.  Reproductive: Uterus and bilateral adnexa are unremarkable. IUD appears in good position.  Other: None  Musculoskeletal: No acute or significant osseous findings. BILATERAL L5 spondylolysis with grade 1 spondylolisthesis.  IMPRESSION: BILATERAL nephrolithiasis without hydronephrosis.  Sequelae of gastric bypass surgery, without bowel obstruction.   Electronically Signed   By: Elsie Stain M.D.   On: 09/08/2016 14:37  Assessment & Plan:    1. Right renal stone  - offered patient ESWL and URS/LL/ureteral stent placement for definitive management of her renal stone  - schedule RIGHT ureteroscopy with laser lithotripsy and ureteral stent placement  - explained to the patient how the procedure is performed and the risks involved  - informed patient that they will have a stent placed during the procedure and will remain in place after the procedure for a short time.   - stent may be removed in the office with a cystoscope or patient may be instructed to remove the stent themselves by the string  - described "stent pain" as feelings of needing to urinate/overactive bladder and a warm, tingling sensation to intense pain in the affected flank  - residual stones within the kidney or ureter may be present after the procedure and may need to have these  addressed at a different encounter  - injury to the ureter is the most common intra-operative risk, it may result in an open procedure to correct the defect  - infection and bleeding are also risks  - explained the risks of general anesthesia, such as: MI, CVA, paralysis, coma and/or death.  - advised to contact our office or seek treatment in the ED if becomes febrile or pain/ vomiting are difficult control in order to arrange for emergent/urgent intervention  - UA  - Urine culture  2. Right flank pain  - see above  3. Left renal stones  - not causing discomfort at this time  - may need to be addressed at a later  date  4. Gross hematuria  - UA today demonstrates 3-10 RBC's.    - continue to monitor the patient's UA after the treatment/passage of the stone to ensure the hematuria has resolved  - if hematuria persists, we will pursue a hematuria workup with CT Urogram and cystoscopy if appropriate.    Return for right URS/LL/.ureteral stent placement.  These notes generated with voice recognition software. I apologize for typographical errors.  Michiel Cowboy, PA-C  [facility] [address] [place] [address] [place], [place] [address] [phone]

## 2016-09-19 NOTE — Telephone Encounter (Signed)
Can you put the order in for this RUS please and thank you   Marcelino DusterMichelle

## 2016-09-19 NOTE — Anesthesia Procedure Notes (Signed)
Procedure Name: Intubation Date/Time: 09/19/2016 8:50 AM Performed by: Nelda Marseille Pre-anesthesia Checklist: Patient identified, Patient being monitored, Timeout performed, Emergency Drugs available and Suction available Patient Re-evaluated:Patient Re-evaluated prior to inductionOxygen Delivery Method: Circle system utilized Preoxygenation: Pre-oxygenation with 100% oxygen Intubation Type: IV induction Ventilation: Mask ventilation without difficulty Laryngoscope Size: Mac and 3 Grade View: Grade I Tube type: Oral Tube size: 7.0 mm Number of attempts: 1 Airway Equipment and Method: Stylet Placement Confirmation: ETT inserted through vocal cords under direct vision,  positive ETCO2 and breath sounds checked- equal and bilateral Secured at: 22 cm Tube secured with: Tape Dental Injury: Teeth and Oropharynx as per pre-operative assessment

## 2016-09-19 NOTE — Anesthesia Preprocedure Evaluation (Signed)
Anesthesia Evaluation  Patient identified by MRN, date of birth, ID band Patient awake    Reviewed: Allergy & Precautions, H&P , NPO status , Patient's Chart, lab work & pertinent test results  History of Anesthesia Complications (+) PONV and history of anesthetic complications  Airway Mallampati: II  TM Distance: >3 FB Neck ROM: full    Dental  (+) Poor Dentition, Chipped   Pulmonary neg pulmonary ROS, neg shortness of breath,           Cardiovascular Exercise Tolerance: Good hypertension (h/o, but no longer since >100lb weight loss), (-) angina(-) Past MI and (-) DOE      Neuro/Psych  Headaches, PSYCHIATRIC DISORDERS    GI/Hepatic negative GI ROS, Neg liver ROS,   Endo/Other  negative endocrine ROS  Renal/GU negative Renal ROS  negative genitourinary   Musculoskeletal   Abdominal   Peds  Hematology negative hematology ROS (+)   Anesthesia Other Findings Past Medical History: No date: Depression No date: Diabetes mellitus without complication (HCC)     Comment: resolved with weight loss from gastric bypass No date: Headache     Comment: migraines No date: Hypertension     Comment: resolved since gastric bypass, severe               pre-eclampsia with G1 2015: Meningitis No date: PCOS (polycystic ovarian syndrome) No date: Preterm labor  Past Surgical History: No date: CESAREAN SECTION No date: CHOLECYSTECTOMY 04/2015: GASTRIC BYPASS No date: TONSILLECTOMY  BMI    Body Mass Index:  35.89 kg/m      Reproductive/Obstetrics negative OB ROS                             Anesthesia Physical  Anesthesia Plan  ASA: II  Anesthesia Plan: General ETT   Post-op Pain Management:    Induction:   Airway Management Planned:   Additional Equipment:   Intra-op Plan:   Post-operative Plan:   Informed Consent: I have reviewed the patients History and Physical, chart, labs and  discussed the procedure including the risks, benefits and alternatives for the proposed anesthesia with the patient or authorized representative who has indicated his/her understanding and acceptance.   Dental Advisory Given  Plan Discussed with: Anesthesiologist, CRNA and Surgeon  Anesthesia Plan Comments:         Anesthesia Quick Evaluation

## 2016-09-19 NOTE — OR Nursing (Signed)
Per pt's spouse, Dr. Sherryl BartersBudzyn advised patient to remove stent on Monday 09/22/16.  I wrote this on her d/c instructions.

## 2016-09-19 NOTE — Op Note (Signed)
Date of procedure: 09/19/16  Preoperative diagnosis:  1. Right renal stone   Postoperative diagnosis:  1. Right renal stone   Procedure: 1. Cystoscopy 2. Right ureteroscopy 3. Laser lithotripsy 4. Right retrograde pyelogram with interpretation 5. Right ureteral stent placement 6 French by 26 cm with tether  Surgeon: Maria Gouty, MD  Anesthesia: General  Complications: None  Intraoperative findings: The patient had a 7 mm right midpole renal stone that was dusted. Right retrograde powder with the procedure showed no further filling defects.  EBL: None  Specimens: None  Drains: Right ureteral stent 6 French by 26 cm with tether  Disposition: Stable to the postanesthesia care unit  Indication for procedure: The patient is a 31 y.o. female with history of gastric bypass a year and a half ago who has since formed 10 stones. She did not have stones prior to this procedure. She has a right 8 mm midpole stone and a 4-5 mm left lower pole stone. She is symptomatic right flank pain and is concerned that she is passing small fragments of stone. These were recently sent off and confirmed to be calcium oxalate. She presents today to treat her right symptomatic flank pain and to rule out a right renal stone being the source..  After reviewing the management options for treatment, the patient elected to proceed with the above surgical procedure(s). We have discussed the potential benefits and risks of the procedure, side effects of the proposed treatment, the likelihood of the patient achieving the goals of the procedure, and any potential problems that might occur during the procedure or recuperation. Informed consent has been obtained.  Description of procedure: The patient was met in the preoperative area. All risks, benefits, and indications of the procedure were described in great detail. The patient consented to the procedure. Preoperative antibiotics were given. The patient was taken to  the operative theater. General anesthesia was induced per the anesthesia service. The patient was then placed in the dorsal lithotomy position and prepped and draped in the usual sterile fashion. A preoperative timeout was called.   A 21 French 30 cystoscope was inserted into the patient's bladder per urethra atraumatically. 2 sensor wires were advanced level of the renal pelvis with a dual-lumen catheter under fluoroscopy. The cystoscope was withdrawn and under fluoroscopy a ureteral access sheath was easily advanced up to the level of the right renal pelvis. The inner sheath and sensor wire were removed. Pan nephroscopy was then performed a flexible ureteroscope through the access sheath. The known 7-8 mm stone was seen in the right midpole. With laser lithotripsy it trended dust very easily with very little energy. Since she recently had a stone analysis on calcium oxalate, the decision was made not to obtain bone for specimen. Pan nephroscopy revealed no fragments greater than 1-2 mm. There is no other stones. This was confirmed the right retrograde powder. The ureteral access sheath was then removed under direct visualization showing no significant trauma or ureteral stones. Over the remaining sensor wire, with the aid of the cystoscope was 6 [place] by 26 cm double-J ureteral stent was placed with tether. The sensor wire was removed. A curl seen in the renal pelvis with fluoroscopy under direct visualization in the urinary bladder. The scope was carefully dissected assembled leaving the string in place hanging from the patient's urethra. Fluoroscopy of both the bladder and right renal pelvis revealed the correct position of the curls dusts proving the stent was not manipulated with removing of the scope.  The patient was then removed from anesthesia and transferred in stable condition to the postanesthesia care unit.  Plan: The patient will pull on her strain to remove her stent in approximately 3 days.  She'll follow-up in one month the renal ultrasound to rule out iatrogenic hydronephrosis. She will need to monitor this stone in her left lower pole that she did not want addressed and the surgery. We'll also likely need a 24-hour urine studies the patient has developed 10 stones since her gastric bypass surgery 1.5 years ago with no previous history prior to that surgery.  Maria Fuller, M.D.

## 2016-09-19 NOTE — Anesthesia Postprocedure Evaluation (Signed)
Anesthesia Post Note  Patient: Sabino Dickatasha K Chittenden  Procedure(s) Performed: Procedure(s) (LRB): URETEROSCOPY WITH HOLMIUM LASER LITHOTRIPSY (Right) CYSTOSCOPY WITH STENT PLACEMENT (Right)  Patient location during evaluation: PACU Anesthesia Type: General Level of consciousness: awake and alert Pain management: pain level controlled Vital Signs Assessment: post-procedure vital signs reviewed and stable Respiratory status: spontaneous breathing, nonlabored ventilation, respiratory function stable and patient connected to nasal cannula oxygen Cardiovascular status: blood pressure returned to baseline and stable Postop Assessment: no signs of nausea or vomiting Anesthetic complications: no     Last Vitals:  Vitals:   09/19/16 1039 09/19/16 1130  BP: 126/77 112/71  Pulse: (!) 58 (!) 55  Resp: 16 16  Temp: 36.6 C     Last Pain:  Vitals:   09/19/16 1130  TempSrc:   PainSc: 5                  Lenard SimmerAndrew Edvin Albus

## 2016-09-19 NOTE — Interval H&P Note (Signed)
History and Physical Interval Note:  09/19/2016 8:28 AM  Maria DickNatasha K Rady  has presented today for surgery, with the diagnosis of RIGHT RENAL STONE  The various methods of treatment have been discussed with the patient and family. After consideration of risks, benefits and other options for treatment, the patient has consented to  Procedure(s): URETEROSCOPY WITH HOLMIUM LASER LITHOTRIPSY (Right) CYSTOSCOPY WITH STENT PLACEMENT (Right) as a surgical intervention .  The patient's history has been reviewed, patient examined, no change in status, stable for surgery.  I have reviewed the patient's chart and labs.  Questions were answered to the patient's satisfaction.    RRR Lungs clear  Hildred LaserBrian James Nasirah Sachs

## 2016-09-19 NOTE — Telephone Encounter (Signed)
Orders placed.

## 2016-09-19 NOTE — Telephone Encounter (Signed)
-----   Message from Hildred LaserBrian James Budzyn, MD sent at 09/19/2016  9:33 AM EDT ----- Patient needs to see me in one month with renal u/s prior. thanks

## 2016-09-19 NOTE — Discharge Instructions (Signed)

## 2016-09-20 ENCOUNTER — Emergency Department
Admission: EM | Admit: 2016-09-20 | Discharge: 2016-09-20 | Disposition: A | Discharge status: Home / Self Care | Payer: 59 | Attending: Emergency Medicine | Admitting: Emergency Medicine

## 2016-09-20 ENCOUNTER — Encounter: Payer: Self-pay | Admitting: Emergency Medicine

## 2016-09-20 ENCOUNTER — Emergency Department: Payer: 59

## 2016-09-20 DIAGNOSIS — R109 Unspecified abdominal pain: Secondary | ICD-10-CM

## 2016-09-20 DIAGNOSIS — R52 Pain, unspecified: Secondary | ICD-10-CM

## 2016-09-20 DIAGNOSIS — Z955 Presence of coronary angioplasty implant and graft: Secondary | ICD-10-CM | POA: Insufficient documentation

## 2016-09-20 DIAGNOSIS — E119 Type 2 diabetes mellitus without complications: Secondary | ICD-10-CM | POA: Diagnosis not present

## 2016-09-20 DIAGNOSIS — I10 Essential (primary) hypertension: Secondary | ICD-10-CM | POA: Diagnosis not present

## 2016-09-20 LAB — COMPREHENSIVE METABOLIC PANEL WITH GFR
ALT: 17 U/L (ref 14–54)
AST: 20 U/L (ref 15–41)
Albumin: 4.6 g/dL (ref 3.5–5.0)
Alkaline Phosphatase: 71 U/L (ref 38–126)
Anion gap: 8 (ref 5–15)
BUN: 11 mg/dL (ref 6–20)
CO2: 24 mmol/L (ref 22–32)
Calcium: 9 mg/dL (ref 8.9–10.3)
Chloride: 106 mmol/L (ref 101–111)
Creatinine, Ser: 0.58 mg/dL (ref 0.44–1.00)
GFR calc Af Amer: >60 mL/min
GFR calc non Af Amer: >60 mL/min
Glucose, Bld: 112 mg/dL — ABNORMAL HIGH (ref 65–99)
Potassium: 3.8 mmol/L (ref 3.5–5.1)
Sodium: 138 mmol/L (ref 135–145)
Total Bilirubin: 0.6 mg/dL (ref 0.3–1.2)
Total Protein: 7.7 g/dL (ref 6.5–8.1)

## 2016-09-20 LAB — URINALYSIS, COMPLETE (UACMP) WITH MICROSCOPIC
Bilirubin Urine: NEGATIVE
Glucose, UA: NEGATIVE mg/dL
Ketones, ur: NEGATIVE mg/dL
Nitrite: NEGATIVE
Protein, ur: 100 mg/dL — AB
Specific Gravity, Urine: 1.021 (ref 1.005–1.030)
pH: 5 (ref 5.0–8.0)

## 2016-09-20 LAB — POCT PREGNANCY, URINE: Preg Test, Ur: NEGATIVE

## 2016-09-20 MED ORDER — TAMSULOSIN HCL 0.4 MG PO CAPS: 0.4000 mg | ORAL_CAPSULE | Freq: Every day | ORAL | 0 refills | Status: DC

## 2016-09-20 MED ORDER — HYDROMORPHONE HCL 1 MG/ML IJ SOLN
INTRAMUSCULAR | Status: AC
  Administered 2016-09-20: 0.5 mg via INTRAVENOUS
  Filled 2016-09-20: qty 1

## 2016-09-20 MED ORDER — MORPHINE SULFATE (PF) 4 MG/ML IV SOLN
4.0000 mg | Freq: Once | INTRAVENOUS | Status: AC
  Administered 2016-09-20: 4 mg via INTRAVENOUS

## 2016-09-20 MED ORDER — MORPHINE SULFATE (PF) 4 MG/ML IV SOLN
INTRAVENOUS | Status: AC
  Filled 2016-09-20: qty 1

## 2016-09-20 MED ORDER — HYDROMORPHONE HCL 1 MG/ML IJ SOLN
0.5000 mg | Freq: Once | INTRAMUSCULAR | Status: AC
  Administered 2016-09-20: 0.5 mg via INTRAVENOUS

## 2016-09-20 MED ORDER — KETOROLAC TROMETHAMINE 30 MG/ML IJ SOLN
30.0000 mg | Freq: Once | INTRAMUSCULAR | Status: AC
Start: 2016-09-20 — End: 2016-09-20
  Administered 2016-09-20: 30 mg via INTRAVENOUS
  Filled 2016-09-20: qty 1

## 2016-09-20 MED ORDER — ONDANSETRON HCL 4 MG/2ML IJ SOLN
INTRAMUSCULAR | Status: AC
  Filled 2016-09-20: qty 2

## 2016-09-20 MED ORDER — ONDANSETRON HCL 4 MG/2ML IJ SOLN
4.0000 mg | Freq: Once | INTRAMUSCULAR | Status: AC
  Administered 2016-09-20: 4 mg via INTRAVENOUS

## 2016-09-20 MED ORDER — MORPHINE SULFATE (PF) 4 MG/ML IV SOLN
4.0000 mg | Freq: Once | INTRAVENOUS | Status: AC
  Administered 2016-09-20: 4 mg via INTRAVENOUS
  Filled 2016-09-20: qty 1

## 2016-09-20 MED ORDER — BELLADONNA-OPIUM 16.2-30 MG RE SUPP: 30.0000 mg | Freq: Three times a day (TID) | RECTAL | 0 refills | Status: DC | PRN

## 2016-09-20 MED ORDER — ONDANSETRON HCL 4 MG/2ML IJ SOLN
4.0000 mg | Freq: Once | INTRAMUSCULAR | Status: AC | PRN
  Administered 2016-09-20: 4 mg via INTRAVENOUS
  Filled 2016-09-20: qty 2

## 2016-09-20 NOTE — ED Notes (Signed)
Pt reports having lithotripsy yesterday with a 8mm stone that was blasted per pt. Pt states she has passed 11 stones over the past several weeks. Continues to c/o dysuria.

## 2016-09-20 NOTE — ED Notes (Signed)
Lab notified to run CBC with Diff, spoke with Karena AddisonIrene.

## 2016-09-20 NOTE — ED Notes (Signed)
Patient transported to CT 

## 2016-09-20 NOTE — ED Provider Notes (Signed)
[facility] Emergency Department Provider Note   ____________________________________________   First MD Initiated Contact with Patient 09/20/16 [phone]     (approximate)  I have reviewed the triage vital signs and the nursing notes.   HISTORY  Chief Complaint Flank Pain    HPI Maria Fuller is a 31 y.o. female patient had ureteroscopy with holmium laser lithotripsy and stent placed yesterday patient. Patient was doing well but at 2 AM had sudden onset of severe right-sided pain. Pain continues now. Patient pain is in the right CVA area radiating down into the right side of the abdomen. Pain is very severe made worse by palpation or percussion. Feels like her kidney stone pain but worse.  \ Past Medical History:  Diagnosis Date  . Depression   . Diabetes mellitus without complication (HCC)    resolved with weight loss from gastric bypass  . Headache    migraines. resolved since gastric bypass  . Hypertension    resolved since gastric bypass, severe pre-eclampsia with G1  . Meningitis 2015  . PCOS (polycystic ovarian syndrome)   . Preterm labor     Patient Active Problem List   Diagnosis Date Noted  . Labor and delivery, indication for care 05/05/2016  . Back pain affecting pregnancy 04/17/2016  . Labor and delivery indication for care or intervention 02/26/2016  . Abdominal pain in pregnancy 02/10/2016  . Left flank pain 12/11/2015  . 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 Hx of preeclampsia, prior pregnancy, currently pregnant 10/18/2015  . Previous cesarean delivery, antepartum condition or complication 10/18/2015  . Depression affecting pregnancy in first trimester, antepartum 10/18/2015  . Supervision of high risk pregnancy in first trimester 10/06/2015  . Right flank pain 10/06/2015  . Pyelonephritis affecting pregnancy in first trimester 10/06/2015  . Pyelonephritis complicating  pregnancy 10/06/2015    Past Surgical History:  Procedure Laterality Date  . CESAREAN SECTION    . CESAREAN SECTION N/A 05/07/2016   Procedure: CESAREAN SECTION;  Surgeon: Nadara Mustard, MD;  Location: [facility] ORS;  Service: Obstetrics;  Laterality: N/A;  Time of birth: 10:16 Sex: Female Weight: 3940 kg, 8 lb 11 oz  . CHOLECYSTECTOMY    . CYSTOSCOPY WITH STENT PLACEMENT Right 09/19/2016   Procedure: CYSTOSCOPY WITH STENT PLACEMENT;  Surgeon: Hildred Laser, MD;  Location: [facility] ORS;  Service: Urology;  Laterality: Right;  . GASTRIC BYPASS  04/2015  . TONSILLECTOMY    . URETEROSCOPY WITH HOLMIUM LASER LITHOTRIPSY Right 09/19/2016   Procedure: URETEROSCOPY WITH HOLMIUM LASER LITHOTRIPSY;  Surgeon: Hildred Laser, MD;  Location: [facility] ORS;  Service: Urology;  Laterality: Right;    Prior to Admission medications   Medication Sig Start Date End Date Taking? Authorizing Provider  ciprofloxacin (CIPRO) 500 MG tablet Take 1 tablet (500 mg total) by mouth 2 (two) times daily. 09/19/16   Hildred Laser, MD  ibuprofen (ADVIL,MOTRIN) 600 MG tablet Take 1 tablet (600 mg total) by mouth every 6 (six) hours as needed for mild pain or cramping. 05/09/16   Conard Novak, MD  oxyCODONE (ROXICODONE) 5 MG immediate release tablet Take 1 tablet (5 mg total) by mouth every 6 (six) hours as needed for breakthrough pain. 09/08/16   Sharman Cheek, MD  oxyCODONE-acetaminophen (PERCOCET/ROXICET) 5-325 MG tablet Take 2 tablets by mouth every 4 (four) hours as needed (pain scale > 7). 09/19/16   Hildred Laser, MD  Prenatal Vit-Fe Fumarate-FA (PRENATAL MULTIVITAMIN) TABS tablet  Take 1 tablet by mouth daily at 12 noon.    [provider]  sertraline (ZOLOFT) 100 MG tablet Take 200 mg by mouth daily.     [provider]    Allergies Cefdinir and Penicillins  Family History  Problem Relation Age of Onset  . Polycystic ovary syndrome Mother   . Diabetes Father   . Endometriosis  Sister   . Polycystic ovary syndrome Sister   . Breast cancer Paternal Grandmother   . Diabetes Paternal Grandmother   . Diabetes Paternal Grandfather   . Kidney cancer Neg Hx   . Prostate cancer Neg Hx   . Bladder Cancer Neg Hx     Social History Social History  Substance Use Topics  . Smoking status: Never Smoker  . Smokeless tobacco: Never Used  . Alcohol use No    Review of Systems  Constitutional: No fever/chills Eyes: No visual changes. ENT: No sore throat. Cardiovascular: Denies chest pain. Respiratory: Denies shortness of breath. Gastrointestinal:See history of present illness Musculoskeletal: Negative for back pain. Skin: Negative for rash. Neurological: Negative for headaches, focal weakness or numbness. GU. Some dysuria. ____________________________________________   PHYSICAL EXAM:  VITAL SIGNS: ED Triage Vitals  Enc Vitals Group     BP 09/20/16 0651 135/90     Pulse Rate 09/20/16 0651 75     Resp 09/20/16 0651 (!) 24     Temp 09/20/16 0651 97.9 F (36.6 C)     Temp Source 09/20/16 0651 Oral     SpO2 09/20/16 0651 100 %     Weight 09/20/16 0643 230 lb (104.3 kg)     Height 09/20/16 0643 6\' 1"  (1.854 m)     Head Circumference --      Peak Flow --      Pain Score 09/20/16 0642 10     Pain Loc --      Pain Edu? --      Excl. in GC? --    Constitutional: Alert and oriented. ill appearing and in  acute distress. Eyes: Conjunctivae are normal. PERRL. EOMI. Head: Atraumatic. Nose: No congestion/rhinnorhea. Mouth/Throat: Mucous membranes are moist.  Oropharynx non-erythematous. Neck: No stridor.  Cardiovascular: Normal rate, regular rhythm. Grossly normal heart sounds.  Good peripheral circulation. Respiratory: Normal respiratory effort.  No retractions. Lungs CTAB. Gastrointestinal: Soft and tender. No distention. No abdominal bruits.  R CVA tenderness. Musculoskeletal: No lower extremity tenderness nor edema.  No joint effusions. Neurologic:   Normal speech and language. No gross focal neurologic deficits are appreciated.  Skin:  Skin is warm, dry and intact. No rash noted. Psychiatric: Mood and affect are normal. Speech and behavior are normal.  ____________________________________________   LABS (all labs ordered are listed, but only abnormal results are displayed)  Labs Reviewed  URINALYSIS, COMPLETE (UACMP) WITH MICROSCOPIC - Abnormal; Notable for the following:       Result Value   Color, Urine YELLOW (*)    APPearance CLOUDY (*)    Hgb urine dipstick LARGE (*)    Protein, ur 100 (*)    Leukocytes, UA MODERATE (*)    Bacteria, UA RARE (*)    Squamous Epithelial / LPF 0-5 (*)    All other components within normal limits  COMPREHENSIVE METABOLIC PANEL - Abnormal; Notable for the following:    Glucose, Bld 112 (*)    All other components within normal limits  URINE CULTURE  CBC WITH DIFFERENTIAL/PLATELET  POCT PREGNANCY, URINE  POC URINE PREG, ED   ____________________________________________  EKG   ____________________________________________  RADIOLOGY  Study Result   CLINICAL DATA:  Flank pain  EXAM: CT ABDOMEN AND PELVIS WITHOUT CONTRAST  TECHNIQUE: Multidetector CT imaging of the abdomen and pelvis was performed following the standard protocol without IV contrast.  COMPARISON:  09/08/2016  FINDINGS: Lower chest: Dependent atelectasis  Hepatobiliary: Postcholecystectomy.  Liver is within normal limits.  Pancreas: Unremarkable  Spleen: Unremarkable  Adrenals/Urinary Tract: Small calculi in the collecting system of the left kidney are not significantly changed. There is no left hydronephrosis.  Mild right hydronephrosis has developed since the prior study. A right ureteral stent has been placed. The distal wound is coiled in the bladder. The proximal and is position at the right ureteropelvic junction. Small calculi are present in the collecting system of the right  kidney.  Stomach/Bowel: Prominent stool burden and distension of the colon are noted. No free intraperitoneal gas. No obvious pneumatosis. Postoperative changes in the stomach and bowel are noted. These are suspected to be secondary to gastric bypass surgery.  Vascular/Lymphatic: No evidence of aortic aneurysm. Small para-aortic lymph nodes.  Reproductive: IUD device is in place but somewhat malpositioned. It is in the fundus but oriented horizontally. Adnexa are within normal limits.  Other: No free-fluid. There is diffuse subcutaneous fat and intra- abdominal fat edema.  Musculoskeletal: Bilateral L5 pars defects with grade 1 spondylolisthesis.  IMPRESSION: Right ureteral stent has been placed. Right hydronephrosis has developed.  Bilateral nephrolithiasis.  Prominent stool burden and cash is distension of the colon.   Electronically Signed   By: Jolaine ClickArthur  Hoss M.D.   On: 09/20/2016 09:25     ____________________________________________   PROCEDURES  Procedure(s) performed:   Procedures  Critical Care performed:   ____________________________________________   INITIAL IMPRESSION / ASSESSMENT AND PLAN / ED COURSE  Pertinent labs & imaging results that were available during my care of the patient were reviewed by me and considered in my medical decision making (see chart for details).        ____________________________________________   FINAL CLINICAL IMPRESSION(S) / ED DIAGNOSES  Final diagnoses:  Pain      NEW MEDICATIONS STARTED DURING THIS VISIT:  New Prescriptions   No medications on file     Note:  This document was prepared using Dragon voice recognition software and may include unintentional dictation errors.    Arnaldo NatalMalinda, Paul F, MD 09/20/16 [phone]

## 2016-09-20 NOTE — Discharge Instructions (Signed)
Your pain is coming from the stent and as the pieces of kidney stone moves through a wall hurt. Please return for worse pain fever vomiting or feeling sicker. Please use the Toradol with food 4 times a day the belladonna and opium suppositories and the Flomax to help. The pain gets worse please make sure to come back. Follow-up with your urologist this coming week.

## 2016-09-20 NOTE — ED Triage Notes (Signed)
Pt to triage via wheelchair. Pt reports she had uroscopy with stent placement yesterday. Pt reports she is unable to control her pain with the percocet 5/325 2 tabs every 4 hours. Pt reports she has vomited x 3 this morning.

## 2016-09-20 NOTE — ED Notes (Addendum)
Called lab and spoke with Robin re: CBC that has not yet resulted even after calling earlier this morning. Will fax the results.

## 2016-09-21 LAB — URINE CULTURE: CULTURE: NO GROWTH

## 2016-09-22 ENCOUNTER — Observation Stay
Admission: EM | Admit: 2016-09-22 | Discharge: 2016-09-23 | Disposition: A | Discharge status: Home / Self Care | Payer: 59 | Attending: Urology | Admitting: Urology

## 2016-09-22 ENCOUNTER — Emergency Department: Payer: 59

## 2016-09-22 ENCOUNTER — Encounter: Payer: Self-pay | Admitting: Emergency Medicine

## 2016-09-22 DIAGNOSIS — G8918 Other acute postprocedural pain: Secondary | ICD-10-CM | POA: Diagnosis present

## 2016-09-22 DIAGNOSIS — E282 Polycystic ovarian syndrome: Secondary | ICD-10-CM | POA: Diagnosis not present

## 2016-09-22 DIAGNOSIS — Z79899 Other long term (current) drug therapy: Secondary | ICD-10-CM | POA: Diagnosis not present

## 2016-09-22 DIAGNOSIS — F329 Major depressive disorder, single episode, unspecified: Secondary | ICD-10-CM | POA: Insufficient documentation

## 2016-09-22 DIAGNOSIS — R109 Unspecified abdominal pain: Secondary | ICD-10-CM

## 2016-09-22 DIAGNOSIS — Z87442 Personal history of urinary calculi: Secondary | ICD-10-CM | POA: Insufficient documentation

## 2016-09-22 DIAGNOSIS — Z8661 Personal history of infections of the central nervous system: Secondary | ICD-10-CM | POA: Diagnosis not present

## 2016-09-22 DIAGNOSIS — N1330 Unspecified hydronephrosis: Secondary | ICD-10-CM | POA: Diagnosis present

## 2016-09-22 DIAGNOSIS — N200 Calculus of kidney: Secondary | ICD-10-CM

## 2016-09-22 DIAGNOSIS — N132 Hydronephrosis with renal and ureteral calculous obstruction: Principal | ICD-10-CM | POA: Insufficient documentation

## 2016-09-22 DIAGNOSIS — Z9884 Bariatric surgery status: Secondary | ICD-10-CM | POA: Insufficient documentation

## 2016-09-22 LAB — URINALYSIS, COMPLETE (UACMP) WITH MICROSCOPIC
BACTERIA UA: NONE SEEN
BILIRUBIN URINE: NEGATIVE
Glucose, UA: NEGATIVE mg/dL
Ketones, ur: NEGATIVE mg/dL
NITRITE: NEGATIVE
Protein, ur: 100 mg/dL — AB
Specific Gravity, Urine: 1.014 (ref 1.005–1.030)
Squamous Epithelial / LPF: NONE SEEN
pH: 6 (ref 5.0–8.0)

## 2016-09-22 LAB — CBC
HCT: 34.3 % — ABNORMAL LOW (ref 35.0–47.0)
Hemoglobin: 11.6 g/dL — ABNORMAL LOW (ref 12.0–16.0)
MCH: 30.4 pg (ref 26.0–34.0)
MCHC: 33.8 g/dL (ref 32.0–36.0)
MCV: 90 fL (ref 80.0–100.0)
Platelets: 337 10*3/uL (ref 150–440)
RBC: 3.81 MIL/uL (ref 3.80–5.20)
RDW: 12.5 % (ref 11.5–14.5)
WBC: 7.8 10*3/uL (ref 3.6–11.0)

## 2016-09-22 LAB — COMPREHENSIVE METABOLIC PANEL
ALK PHOS: 67 U/L (ref 38–126)
ALT: 16 U/L (ref 14–54)
ANION GAP: 4 — AB (ref 5–15)
AST: 15 U/L (ref 15–41)
Albumin: 4.3 g/dL (ref 3.5–5.0)
BILIRUBIN TOTAL: 0.5 mg/dL (ref 0.3–1.2)
BUN: 13 mg/dL (ref 6–20)
CALCIUM: 8.9 mg/dL (ref 8.9–10.3)
CO2: 27 mmol/L (ref 22–32)
CREATININE: 0.7 mg/dL (ref 0.44–1.00)
Chloride: 108 mmol/L (ref 101–111)
Glucose, Bld: 90 mg/dL (ref 65–99)
Potassium: 4.3 mmol/L (ref 3.5–5.1)
SODIUM: 139 mmol/L (ref 135–145)
Total Protein: 7.5 g/dL (ref 6.5–8.1)

## 2016-09-22 LAB — POCT PREGNANCY, URINE: Preg Test, Ur: NEGATIVE

## 2016-09-22 LAB — MRSA PCR SCREENING: MRSA by PCR: NEGATIVE

## 2016-09-22 MED ORDER — ONDANSETRON HCL 4 MG/2ML IJ SOLN
4.0000 mg | Freq: Once | INTRAMUSCULAR | Status: AC
  Administered 2016-09-22: 4 mg via INTRAVENOUS
  Filled 2016-09-22: qty 2

## 2016-09-22 MED ORDER — BISACODYL 10 MG RE SUPP: 10.0000 mg | Freq: Every day | RECTAL | Status: DC | PRN

## 2016-09-22 MED ORDER — ONDANSETRON HCL 4 MG/2ML IJ SOLN: 4.0000 mg | Freq: Four times a day (QID) | INTRAMUSCULAR | Status: DC | PRN

## 2016-09-22 MED ORDER — TAMSULOSIN HCL 0.4 MG PO CAPS
0.4000 mg | ORAL_CAPSULE | Freq: Every day | ORAL | Status: DC
  Administered 2016-09-22 – 2016-09-23 (×2): 0.4 mg via ORAL
  Filled 2016-09-22 (×2): qty 1

## 2016-09-22 MED ORDER — TAMSULOSIN HCL 0.4 MG PO CAPS: 0.4000 mg | ORAL_CAPSULE | Freq: Every day | ORAL | Status: DC

## 2016-09-22 MED ORDER — KETOROLAC TROMETHAMINE 30 MG/ML IJ SOLN
30.0000 mg | Freq: Once | INTRAMUSCULAR | Status: AC
  Administered 2016-09-22: 30 mg via INTRAVENOUS
  Filled 2016-09-22: qty 1

## 2016-09-22 MED ORDER — OXYCODONE HCL 5 MG PO TABS
5.0000 mg | ORAL_TABLET | Freq: Four times a day (QID) | ORAL | Status: DC | PRN
  Administered 2016-09-22: 5 mg via ORAL
  Filled 2016-09-22: qty 1

## 2016-09-22 MED ORDER — SODIUM CHLORIDE 0.9 % IV SOLN
1000.0000 mL | Freq: Once | INTRAVENOUS | Status: AC
  Administered 2016-09-22: 1000 mL via INTRAVENOUS

## 2016-09-22 MED ORDER — KCL IN DEXTROSE-NACL 20-5-0.45 MEQ/L-%-% IV SOLN
INTRAVENOUS | Status: DC
  Administered 2016-09-22 – 2016-09-23 (×3): via INTRAVENOUS
  Filled 2016-09-22 (×4): qty 1000

## 2016-09-22 MED ORDER — KETOROLAC TROMETHAMINE 30 MG/ML IJ SOLN
INTRAMUSCULAR | Status: AC
  Filled 2016-09-22: qty 1

## 2016-09-22 MED ORDER — MORPHINE SULFATE (PF) 4 MG/ML IV SOLN
4.0000 mg | Freq: Once | INTRAVENOUS | Status: AC
  Administered 2016-09-22: 4 mg via INTRAVENOUS
  Filled 2016-09-22: qty 1

## 2016-09-22 MED ORDER — ACETAMINOPHEN 10 MG/ML IV SOLN
1000.0000 mg | Freq: Four times a day (QID) | INTRAVENOUS | Status: AC
  Administered 2016-09-22 – 2016-09-23 (×4): 1000 mg via INTRAVENOUS
  Filled 2016-09-22 (×4): qty 100

## 2016-09-22 MED ORDER — CIPROFLOXACIN HCL 500 MG PO TABS
500.0000 mg | ORAL_TABLET | Freq: Two times a day (BID) | ORAL | Status: DC
  Administered 2016-09-22 – 2016-09-23 (×2): 500 mg via ORAL
  Filled 2016-09-22 (×2): qty 1

## 2016-09-22 MED ORDER — SERTRALINE HCL 50 MG PO TABS
200.0000 mg | ORAL_TABLET | Freq: Every day | ORAL | Status: DC
  Administered 2016-09-22: 200 mg via ORAL
  Filled 2016-09-22: qty 4

## 2016-09-22 MED ORDER — KETOROLAC TROMETHAMINE 15 MG/ML IJ SOLN
15.0000 mg | Freq: Four times a day (QID) | INTRAMUSCULAR | Status: DC | PRN
  Administered 2016-09-22 – 2016-09-23 (×2): 15 mg via INTRAVENOUS
  Filled 2016-09-22 (×2): qty 1

## 2016-09-22 MED ORDER — POTASSIUM CHLORIDE 2 MEQ/ML IV SOLN: INTRAVENOUS | Status: DC

## 2016-09-22 MED ORDER — PANTOPRAZOLE SODIUM 40 MG IV SOLR
40.0000 mg | INTRAVENOUS | Status: DC
  Administered 2016-09-22: 40 mg via INTRAVENOUS
  Filled 2016-09-22: qty 40

## 2016-09-22 NOTE — Progress Notes (Signed)
Pt was educated on the importance of urinating in the collection hat in the toilet and to let the nursing staff know when she uses the bathroom, so we can strain pt.'s urine. RN has emptied collection hat and strained pt.'s urine, there was sediment in the pt.'s urine with a bloody appearance.   Zoeann Mol [employer]

## 2016-09-22 NOTE — ED Notes (Signed)
Patient transported to US at this time.  

## 2016-09-22 NOTE — ED Triage Notes (Signed)
Had procedure for kidney stone R side 3 days ago, flank pain and chills.

## 2016-09-22 NOTE — Progress Notes (Signed)
I was contacted by the emergency departmentabout this patient this morning. She is status post right ureteroscopy, laser lithotripsy and stent placement. Her stent was dislodged on Saturday or Sunday this weekend.She presented to the emergency department today with ongoing pain. She did have a CAT scan on Saturday demonstrating that the stent was then reasonable position and she had some stone fragments remaining in the upper pole of the right kidney.  An ultrasound today demonstrates hydroureteronephrosis.  I suspect the patient is either passing small stone fragments or has ureteral edema from premature removal of her stent.  I recommended the patient be given some Toradol to help with her pain and inflammation as well as some tamsulosin. If her pain is not better controlled, we could admit the patient for IV pain control. I don't think that taking the patient back to the operating room  For replacement of this stent would be advisable given her intolerance of the stent initially.  The ED will contact us if any additional assistance.

## 2016-09-22 NOTE — ED Provider Notes (Signed)
[facility] Emergency Department Provider Note   ____________________________________________    I have reviewed the triage vital signs and the nursing notes.   HISTORY  Chief Complaint Flank Pain     HPI Maria Fuller is a 31 y.o. female who presents with right-sided flank pain which she reports is severe and sharp in nature. She also complains of nausea and vomiting. 3 days ago the patient had stent placement and laser lithotripsy. She was seen 2 days ago in the emergency department for pain, she felt better after she was treated however when she got home she was wiping after urinating and accidentally pulled the string and partially removed her stent, she went ahead and pulled all the way out and shortly thereafter developed moderate to severe pain in her right flank. She also reports overnight she had chills although she did not have a fever.    Past Medical History:  Diagnosis Date  . Depression   . Diabetes mellitus without complication (HCC)    resolved with weight loss from gastric bypass  . Headache    migraines. resolved since gastric bypass  . Hypertension    resolved since gastric bypass, severe pre-eclampsia with G1  . Meningitis 2015  . PCOS (polycystic ovarian syndrome)   . Preterm labor     Patient Active Problem List   Diagnosis Date Noted  . Labor and delivery, indication for care 05/05/2016  . Back pain affecting pregnancy 04/17/2016  . Labor and delivery indication for care or intervention 02/26/2016  . Abdominal pain in pregnancy 02/10/2016  . Left flank pain 12/11/2015  . 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 Hx of preeclampsia, prior pregnancy, currently pregnant 10/18/2015  . Previous cesarean delivery, antepartum condition or complication 10/18/2015  . Depression affecting pregnancy in first trimester, antepartum 10/18/2015  . Supervision of high risk pregnancy in  first trimester 10/06/2015  . Right flank pain 10/06/2015  . Pyelonephritis affecting pregnancy in first trimester 10/06/2015  . Pyelonephritis complicating pregnancy 10/06/2015    Past Surgical History:  Procedure Laterality Date  . CESAREAN SECTION    . CESAREAN SECTION N/A 05/07/2016   Procedure: CESAREAN SECTION;  Surgeon: Nadara Mustardobert P Harris, MD;  Location: [facility] ORS;  Service: Obstetrics;  Laterality: N/A;  Time of birth: 10:16 Sex: Female Weight: 3940 kg, 8 lb 11 oz  . CHOLECYSTECTOMY    . CYSTOSCOPY WITH STENT PLACEMENT Right 09/19/2016   Procedure: CYSTOSCOPY WITH STENT PLACEMENT;  Surgeon: Hildred LaserBudzyn, Brian James, MD;  Location: [facility] ORS;  Service: Urology;  Laterality: Right;  . GASTRIC BYPASS  04/2015  . TONSILLECTOMY    . URETEROSCOPY WITH HOLMIUM LASER LITHOTRIPSY Right 09/19/2016   Procedure: URETEROSCOPY WITH HOLMIUM LASER LITHOTRIPSY;  Surgeon: Hildred LaserBudzyn, Brian James, MD;  Location: [facility] ORS;  Service: Urology;  Laterality: Right;    Prior to Admission medications   Medication Sig Start Date End Date Taking? Authorizing Provider  belladonna-opium (B&O SUPPRETTES) 16.2-30 MG suppository Place 1 suppository rectally every 8 (eight) hours as needed for pain. 09/20/16  Yes Arnaldo NatalMalinda, Paul F, MD  oxyCODONE-acetaminophen (PERCOCET/ROXICET) 5-325 MG tablet Take 2 tablets by mouth every 4 (four) hours as needed (pain scale > 7). 09/19/16  Yes Hildred LaserBudzyn, Brian James, MD  sertraline (ZOLOFT) 100 MG tablet Take 200 mg by mouth daily.    Yes [provider]  tamsulosin (FLOMAX) 0.4 MG CAPS capsule Take 1 capsule (0.4 mg total) by mouth daily. 09/20/16  Yes  Arnaldo Natal, MD  ciprofloxacin (CIPRO) 500 MG tablet Take 1 tablet (500 mg total) by mouth 2 (two) times daily. Patient not taking: Reported on 09/22/2016 09/19/16   Hildred Laser, MD  ibuprofen (ADVIL,MOTRIN) 600 MG tablet Take 1 tablet (600 mg total) by mouth every 6 (six) hours as needed for mild pain or cramping. Patient not taking:  Reported on 09/20/2016 05/09/16   Conard Novak, MD  oxyCODONE (ROXICODONE) 5 MG immediate release tablet Take 1 tablet (5 mg total) by mouth every 6 (six) hours as needed for breakthrough pain. Patient not taking: Reported on 09/20/2016 09/08/16   Sharman Cheek, MD     Allergies Cefdinir and Penicillins  Family History  Problem Relation Age of Onset  . Polycystic ovary syndrome Mother   . Diabetes Father   . Endometriosis Sister   . Polycystic ovary syndrome Sister   . Breast cancer Paternal Grandmother   . Diabetes Paternal Grandmother   . Diabetes Paternal Grandfather   . Kidney cancer Neg Hx   . Prostate cancer Neg Hx   . Bladder Cancer Neg Hx     Social History Social History  Substance Use Topics  . Smoking status: Never Smoker  . Smokeless tobacco: Never Used  . Alcohol use No    Review of Systems  Constitutional: Chills as above Eyes: No visual changes.  ENT: No sore throat. Cardiovascular: Denies chest pain. Respiratory: Denies shortness of breath. Gastrointestinal: Flank pain and vomiting as above Genitourinary: Negative for dysuria. Musculoskeletal: Right-sided flank and some back pain Skin: Negative for rash. Neurological: Negative for headaches    ____________________________________________   PHYSICAL EXAM:  VITAL SIGNS: ED Triage Vitals  Enc Vitals Group     BP 09/22/16 0755 123/63     Pulse Rate 09/22/16 0755 70     Resp 09/22/16 0755 18     Temp 09/22/16 0755 98.4 F (36.9 C)     Temp Source 09/22/16 0755 Oral     SpO2 09/22/16 0755 100 %     Weight 09/22/16 0756 225 lb (102.1 kg)     Height 09/22/16 0756 6\' 1"  (1.854 m)     Head Circumference --      Peak Flow --      Pain Score 09/22/16 0754 7     Pain Loc --      Pain Edu? --      Excl. in GC? --     Constitutional: Alert and oriented. No acute distress. Pleasant and interactive Eyes: Conjunctivae are normal.   Nose: No congestion/rhinnorhea. Mouth/Throat: Mucous  membranes are moist.    Cardiovascular: Normal rate, regular rhythm. Grossly normal heart sounds.  Good peripheral circulation. Respiratory: Normal respiratory effort.  No retractions. Lungs CTAB. Gastrointestinal: Soft and nontender. No distention. Right-sided CVA tenderness Genitourinary: deferred Musculoskeletal: No lower extremity tenderness nor edema.  Warm and well perfused Neurologic:  Normal speech and language. No gross focal neurologic deficits are appreciated.  Skin:  Skin is warm, dry and intact. No rash noted. Psychiatric: Mood and affect are normal. Speech and behavior are normal.  ____________________________________________   LABS (all labs ordered are listed, but only abnormal results are displayed)  Labs Reviewed  COMPREHENSIVE METABOLIC PANEL - Abnormal; Notable for the following:       Result Value   Anion gap 4 (*)    All other components within normal limits  CBC - Abnormal; Notable for the following:    Hemoglobin 11.6 (*)  HCT 34.3 (*)    All other components within normal limits  URINALYSIS, COMPLETE (UACMP) WITH MICROSCOPIC - Abnormal; Notable for the following:    Color, Urine RED (*)    APPearance CLOUDY (*)    Hgb urine dipstick LARGE (*)    Protein, ur 100 (*)    Leukocytes, UA TRACE (*)    All other components within normal limits  POCT PREGNANCY, URINE   ____________________________________________  EKG  None ____________________________________________  RADIOLOGY  Ultrasound renal shows continued hydronephrosis ____________________________________________   PROCEDURES  Procedure(s) performed: No    Critical Care performed: No ____________________________________________   INITIAL IMPRESSION / ASSESSMENT AND PLAN / ED COURSE  Pertinent labs & imaging results that were available during my care of the patient were reviewed by me and considered in my medical decision making (see chart for details).  Patient presents after  removal of stent, CT done 2 days ago demonstrates right-sided hydronephrosis. We will check labs, urinalysis and perform ultrasound to evaluate hydronephrosis. IV morphine and IV Zofran given, patient reports her child is primarily formula fed and she will pump and dump  Discussed with Dr. Marlou Porch, recommends continued pain control and will admit if no significant improvement  ----------------------------------------- 12:41 PM on 09/22/2016 -----------------------------------------  Patient with continued pain, D/w Dr. Marlou Porch he will admit    ____________________________________________   FINAL CLINICAL IMPRESSION(S) / ED DIAGNOSES  Final diagnoses:  Flank pain  Post-operative pain      NEW MEDICATIONS STARTED DURING THIS VISIT:  New Prescriptions   No medications on file     Note:  This document was prepared using Dragon voice recognition software and may include unintentional dictation errors.    Jene Every, MD 09/22/16 [phone]

## 2016-09-22 NOTE — ED Notes (Signed)
Pt resting on stretcher at this time, family at bedside. Respirations equal and unlabored. Discussed plan of care. Denies any needs at this time. Offered toileting and blanket but declined at this time. Continue to monitor.

## 2016-09-22 NOTE — H&P (Signed)
History of present illness: 31 year old female who status post right ureteroscopy for a nonobstructing right renal calculus on Friday 5/18. The case was uncomplicated, and a stent tether was leftnd the patient was discharged home. She subsequently presented the following day with severe right-sided flank pain. She was noted to have some hydroureteronephrosis but he stent was in god position. Tone fragments were in the right mid pole calyx. The patient was made comfortable and discharged home. On Sunday her stent was inadvertently dislodged and subsequently removed. Several hours after this she started to develop right-sided flank pain. She presented to the emergency room early this morning with poorly controlled pain.  The patient denies any associated fevers. She does endorse urinary frequency and urgency. She also was having some dysuria. She has any gross hematuria. She's also had associated nausea and vomiting. In the emergency room the patient was given several rounds of IV narcotics as well as Toradol. At the time of our encounter, the patient rated her pain at 5/10. She continued to have severe urgency and frequency.  Review of systems: A 12 point comprehensive review of systems was obtained and is negative unless otherwise stated in the history of present illness.  Patient Active Problem List   Diagnosis Date Noted  . Hydronephrosis 09/22/2016  . Labor and delivery, indication for care 05/05/2016  . Back pain affecting pregnancy 04/17/2016  . Labor and delivery indication for care or intervention 02/26/2016  . Abdominal pain in pregnancy 02/10/2016  . Left flank pain 12/11/2015  . 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 Hx of preeclampsia, prior pregnancy, currently pregnant 10/18/2015  . Previous cesarean delivery, antepartum condition or complication 10/18/2015  . Depression affecting pregnancy in first trimester, antepartum 10/18/2015  .  Supervision of high risk pregnancy in first trimester 10/06/2015  . Right flank pain 10/06/2015  . Pyelonephritis affecting pregnancy in first trimester 10/06/2015  . Pyelonephritis complicating pregnancy 10/06/2015    No current facility-administered medications on file prior to encounter.    Current Outpatient Prescriptions on File Prior to Encounter  Medication Sig Dispense Refill  . belladonna-opium (B&O SUPPRETTES) 16.2-30 MG suppository Place 1 suppository rectally every 8 (eight) hours as needed for pain. 12 suppository 0  . oxyCODONE-acetaminophen (PERCOCET/ROXICET) 5-325 MG tablet Take 2 tablets by mouth every 4 (four) hours as needed (pain scale > 7). 30 tablet 0  . sertraline (ZOLOFT) 100 MG tablet Take 200 mg by mouth daily.     . tamsulosin (FLOMAX) 0.4 MG CAPS capsule Take 1 capsule (0.4 mg total) by mouth daily. 5 capsule 0  . ciprofloxacin (CIPRO) 500 MG tablet Take 1 tablet (500 mg total) by mouth 2 (two) times daily. (Patient not taking: Reported on 09/22/2016) 4 tablet 0  . ibuprofen (ADVIL,MOTRIN) 600 MG tablet Take 1 tablet (600 mg total) by mouth every 6 (six) hours as needed for mild pain or cramping. (Patient not taking: Reported on 09/20/2016) 30 tablet 0  . oxyCODONE (ROXICODONE) 5 MG immediate release tablet Take 1 tablet (5 mg total) by mouth every 6 (six) hours as needed for breakthrough pain. (Patient not taking: Reported on 09/20/2016) 8 tablet 0    Past Medical History:  Diagnosis Date  . Depression   . Diabetes mellitus without complication (HCC)    resolved with weight loss from gastric bypass  . Headache    migraines. resolved since gastric bypass  . Hypertension    resolved since gastric bypass, severe pre-eclampsia with G1  .  Meningitis 2015  . PCOS (polycystic ovarian syndrome)   . Preterm labor     Past Surgical History:  Procedure Laterality Date  . CESAREAN SECTION    . CESAREAN SECTION N/A 05/07/2016   Procedure: CESAREAN SECTION;  Surgeon:  Nadara Mustard, MD;  Location: [facility] ORS;  Service: Obstetrics;  Laterality: N/A;  Time of birth: 10:16 Sex: Female Weight: 3940 kg, 8 lb 11 oz  . CHOLECYSTECTOMY    . CYSTOSCOPY WITH STENT PLACEMENT Right 09/19/2016   Procedure: CYSTOSCOPY WITH STENT PLACEMENT;  Surgeon: Hildred Laser, MD;  Location: [facility] ORS;  Service: Urology;  Laterality: Right;  . GASTRIC BYPASS  04/2015  . TONSILLECTOMY    . URETEROSCOPY WITH HOLMIUM LASER LITHOTRIPSY Right 09/19/2016   Procedure: URETEROSCOPY WITH HOLMIUM LASER LITHOTRIPSY;  Surgeon: Hildred Laser, MD;  Location: [facility] ORS;  Service: Urology;  Laterality: Right;    Social History  Substance Use Topics  . Smoking status: Never Smoker  . Smokeless tobacco: Never Used  . Alcohol use No    Family History  Problem Relation Age of Onset  . Polycystic ovary syndrome Mother   . Diabetes Father   . Endometriosis Sister   . Polycystic ovary syndrome Sister   . Breast cancer Paternal Grandmother   . Diabetes Paternal Grandmother   . Diabetes Paternal Grandfather   . Kidney cancer Neg Hx   . Prostate cancer Neg Hx   . Bladder Cancer Neg Hx     PE: Vitals:   09/22/16 0755 09/22/16 0756 09/22/16 1000  BP: 123/63  (!) 94/49  Pulse: 70  (!) 54  Resp: 18  18  Temp: 98.4 F (36.9 C)    TempSrc: Oral    SpO2: 100%  98%  Weight:  102.1 kg (225 lb)   Height:  6\' 1"  (1.854 m)    Patient appears to be in no acute distress  patient is alert and oriented x3 Atraumatic normocephalic head No cervical or supraclavicular lymphadenopathy appreciated No increased work of breathing, no audible wheezes/rhonchi Regular sinus rhythm/rate Abdomen is soft, nontender, nondistended, mild right-sided CVA tenderness Lower extremities are symmetric without appreciable edema Grossly neurologically intact No identifiable skin lesions   Recent Labs  09/22/16 0809  WBC 7.8  HGB 11.6*  HCT 34.3*    Recent Labs  09/20/16 0655 09/22/16 0809  NA 138  139  K 3.8 4.3  CL 106 108  CO2 24 27  GLUCOSE 112* 90  BUN 11 13  CREATININE 0.58 0.70  CALCIUM 9.0 8.9   No results for input(s): LABPT, INR in the last 72 hours. No results for input(s): LABURIN in the last 72 hours. Results for orders placed or performed during the hospital encounter of 09/20/16  Urine culture     Status: None   Collection Time: 09/20/16  7:20 AM  Result Value Ref Range Status   Specimen Description URINE, CLEAN CATCH  Final   Special Requests NONE  Final   Culture   Final    NO GROWTH Performed at [facility], [address], [place], [place] [address]    Report Status 09/21/2016 FINAL  Final    Imaging: I have reviewed the patient's CT scan from Saturday 5/19 as well as her ultrasound from today both demonstrating right hydronephrosis.  Imp: The patient has poorly controlled pain secondary to ongoing right hydroureteronephrosis. This is either from small obstructing fragments from her previous lithotripsy or ureteral edema secondary to the stent being  removed too early. Replacing her stent at this point seemed as if it would be counterproductive given that it was poorly tolerated initially. I suspect that she will either passed the small stone fragments with the edema will improve over the next 24-48 hours.When given the option of more aggressive oral  Pain control and discharge home with close follow-up or admission with IV medications, the patient opted to be admitted and given that she had been back to the emergency room several times already and was frustrated and worried that if she was at home and the pain returned that she would be bouncing back again.  Recommendations: The plan is to admit the patient for 24-hour observation. We will start her on IV fluids and give her Toradol and IV Tylenol for pain control. We will make her nothing by mouth at midnight in the event that her symptoms do not resolve over th next 12-24 hours and that she does need a  stent replaced.we will continue the ciprofloxacin as ordered by the emergency room on Saturday.  Berniece SalinesHERRICK, Rogue Rafalski W

## 2016-09-23 DIAGNOSIS — R109 Unspecified abdominal pain: Secondary | ICD-10-CM | POA: Diagnosis not present

## 2016-09-23 DIAGNOSIS — G8918 Other acute postprocedural pain: Secondary | ICD-10-CM

## 2016-09-23 LAB — BASIC METABOLIC PANEL
Anion gap: 2 — ABNORMAL LOW (ref 5–15)
BUN: 12 mg/dL (ref 6–20)
CHLORIDE: 111 mmol/L (ref 101–111)
CO2: 28 mmol/L (ref 22–32)
CREATININE: 0.51 mg/dL (ref 0.44–1.00)
Calcium: 8.6 mg/dL — ABNORMAL LOW (ref 8.9–10.3)
GFR calc non Af Amer: >60 mL/min (ref >60)
Glucose, Bld: 92 mg/dL (ref 65–99)
POTASSIUM: 4.2 mmol/L (ref 3.5–5.1)
SODIUM: 141 mmol/L (ref 135–145)

## 2016-09-23 LAB — CBC
HEMATOCRIT: 31.2 % — AB (ref 35.0–47.0)
Hemoglobin: 10.4 g/dL — ABNORMAL LOW (ref 12.0–16.0)
MCH: 29.8 pg (ref 26.0–34.0)
MCHC: 33.3 g/dL (ref 32.0–36.0)
MCV: 89.5 fL (ref 80.0–100.0)
PLATELETS: 296 10*3/uL (ref 150–440)
RBC: 3.48 MIL/uL — AB (ref 3.80–5.20)
RDW: 12.4 % (ref 11.5–14.5)
WBC: 5.8 10*3/uL (ref 3.6–11.0)

## 2016-09-23 NOTE — Progress Notes (Signed)
Discharge order received. Patient is alert and oriented. Vital signs stable . No signs of acute distress. Discharge instructions given. Patient verbalized understanding. No other issues noted at this time.   

## 2016-09-23 NOTE — Progress Notes (Signed)
Urology  Subjective: Pain was reasonably well controlled overnight. No nausea or vomiting. Voiding spontaneously, urine clearing. Overall, feeling improved.  Anti-infectives: Anti-infectives    Start     Dose/Rate Route Frequency Ordered Stop   09/22/16 1500  ciprofloxacin (CIPRO) tablet 500 mg     500 mg Oral 2 times daily 09/22/16 1253        Current Facility-Administered Medications  Medication Dose Route Frequency Provider Last Rate Last Dose  . bisacodyl (DULCOLAX) suppository 10 mg  10 mg Rectal Daily PRN Crist Fat, MD      . ciprofloxacin (CIPRO) tablet 500 mg  500 mg Oral BID Crist Fat, MD   500 mg at 09/22/16 1441  . ondansetron (ZOFRAN) injection 4 mg  4 mg Intravenous Q6H PRN Crist Fat, MD      . oxyCODONE (Oxy IR/ROXICODONE) immediate release tablet 5 mg  5 mg Oral Q6H PRN Crist Fat, MD   5 mg at 09/22/16 1520  . pantoprazole (PROTONIX) injection 40 mg  40 mg Intravenous Q24H Crist Fat, MD   40 mg at 09/22/16 1441  . sertraline (ZOLOFT) tablet 200 mg  200 mg Oral Daily Crist Fat, MD   200 mg at 09/22/16 2005  . tamsulosin (FLOMAX) capsule 0.4 mg  0.4 mg Oral Daily Crist Fat, MD   0.4 mg at 09/22/16 1441     Objective: Vital signs in last 24 hours: Temp:  [97.8 F (36.6 C)-98.3 F (36.8 C)] 97.8 F (36.6 C) (05/22 0651) Pulse Rate:  [51-54] 51 (05/22 0651) Resp:  [18-20] 20 (05/22 0651) BP: (94-120)/(49-71) 114/58 (05/22 0651) SpO2:  [98 %-99 %] 98 % (05/22 0651) Weight:  [230 lb (104.3 kg)] 230 lb (104.3 kg) (05/21 1415)  Intake/Output from previous day: 05/21 0701 - 05/22 0700 In: 3439 [P.O.:240; I.V.:2899; IV Piggyback:300] Out: 1975 [Urine:1975] Intake/Output this shift: Total I/O In: 141 [I.V.:141] Out: -    Physical Exam  Constitutional: She is oriented to person, place, and time and well-developed, well-nourished, and in no distress.  HENT:  Head: Normocephalic and atraumatic.   Cardiovascular: Normal rate.   Pulmonary/Chest: Effort normal. No respiratory distress.  Abdominal: Soft.  Genitourinary:  Genitourinary Comments: No cva tenderness  Neurological: She is alert and oriented to person, place, and time.  Skin: Skin is warm and dry.  Psychiatric: Affect normal.    Lab Results:   Recent Labs  09/22/16 0809 09/23/16 0412  WBC 7.8 5.8  HGB 11.6* 10.4*  HCT 34.3* 31.2*  PLT 337 296   BMET  Recent Labs  09/22/16 0809 09/23/16 0412  NA 139 141  K 4.3 4.2  CL 108 111  CO2 27 28  GLUCOSE 90 92  BUN 13 12  CREATININE 0.70 0.51  CALCIUM 8.9 8.6*    Studies/Results: US Renal  Result Date: 09/22/2016 CLINICAL DATA:  Right flank pain status post stent removal. EXAM: RENAL / URINARY TRACT ULTRASOUND COMPLETE COMPARISON:  09/20/2016 unenhanced CT abdomen/pelvis . FINDINGS: Right Kidney: Length: 16.3 cm. Mildly echogenic right renal parenchyma. Normal right renal parenchymal thickness. No right renal mass. Moderate right hydronephrosis, not convincingly changed since the recent CT study. Probable nonobstructing 5 mm stones in the mid and lower right kidney. Left Kidney: Length: 15.4 cm. Echogenicity within normal limits. No mass or hydronephrosis visualized. Bladder: Debris is noted layering in the bladder. No definite bladder wall thickening. Bilateral ureteral stents are demonstrated in the bladder. IMPRESSION: 1. Persistent moderate right hydronephrosis,  not convincingly changed since 09/20/2016 CT. The cause of the right hydronephrosis is not apparent by sonography. A right ureteral jet is seen in the bladder. 2. Probable nonobstructing stones in the mid to lower right kidney. 3. Mildly echogenic right kidney, suggesting renal parenchymal disease of uncertain chronicity. 4. Normal left kidney.  No left hydronephrosis. 5. Debris layering in the bladder, cannot exclude acute cystitis. Correlate with urinalysis as clinically warranted. Electronically Signed    By: Delbert PhenixJason A Poff M.D.   On: 09/22/2016 09:37     Assessment: 31 year old female with refractory right flank pain following stent removal, improving. No signs or symptoms of infection.  Plan: -Advance diet -Discontinue all IV medication, transitioned to orals -anticipate discharge around noon today if continues to do well     LOS: 0 days    Maria Fuller 09/23/2016

## 2016-09-23 NOTE — Discharge Summary (Signed)
Date of admission: 09/22/2016  Date of discharge: 09/23/2016  Admission diagnosis: Right flank pain, right hydronephrosis  Discharge diagnosis: Right flank pain, right hydronephrosis  Secondary diagnoses:  Patient Active Problem List   Diagnosis Date Noted  . Hydronephrosis 09/22/2016  . Nephrolithiasis 09/22/2016  . Labor and delivery, indication for care 05/05/2016  . Back pain affecting pregnancy 04/17/2016  . Labor and delivery indication for care or intervention 02/26/2016  . Abdominal pain in pregnancy 02/10/2016  . Left flank pain 12/11/2015  . 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 Hx of preeclampsia, prior pregnancy, currently pregnant 10/18/2015  . Previous cesarean delivery, antepartum condition or complication 10/18/2015  . Depression affecting pregnancy in first trimester, antepartum 10/18/2015  . Supervision of high risk pregnancy in first trimester 10/06/2015  . Right flank pain 10/06/2015  . Pyelonephritis affecting pregnancy in first trimester 10/06/2015  . Pyelonephritis complicating pregnancy 10/06/2015    History and Physical: For full details, please see admission history and physical. Briefly, Maria Fuller is a 31 y.o. year old patient with With right flank pain and a mild hydronephrosis admitted for pain control.Marland Kitchen.   Hospital Course:She was then transferred to the floor from the emergency room.  Her pain was able to be controlled in overnight, her pain improved. She had no nausea or vomiting. She remained hemodynamically stable, her labs remained stable, and she was appropriate for discharge on hospital day 2.    Laboratory values:   Recent Labs  09/22/16 0809 09/23/16 0412  WBC 7.8 5.8  HGB 11.6* 10.4*  HCT 34.3* 31.2*    Recent Labs  09/22/16 0809 09/23/16 0412  NA 139 141  K 4.3 4.2  CL 108 111  CO2 27 28  GLUCOSE 90 92  BUN 13 12  CREATININE 0.70 0.51  CALCIUM 8.9 8.6*   No results  for input(s): LABPT, INR in the last 72 hours. No results for input(s): LABURIN in the last 72 hours. Results for orders placed or performed during the hospital encounter of 09/22/16  MRSA PCR Screening     Status: None   Collection Time: 09/22/16  3:22 PM  Result Value Ref Range Status   MRSA by PCR NEGATIVE NEGATIVE Final    Comment:        The GeneXpert MRSA Assay (FDA approved for NASAL specimens only), is one component of a comprehensive MRSA colonization surveillance program. It is not intended to diagnose MRSA infection nor to guide or monitor treatment for MRSA infections.     Disposition: Home  Discharge instruction: Drink plenty of water. Precautions were reviewed personally with the patient.  Discharge medications: Allergies as of 09/23/2016      Reactions   Cefdinir Rash   Penicillins Rash   Has patient had a PCN reaction causing immediate rash, facial/tongue/throat swelling, SOB or lightheadedness with hypotension: Yes Has patient had a PCN reaction causing severe rash involving mucus membranes or skin necrosis: No Has patient had a PCN reaction that required hospitalization No Has patient had a PCN reaction occurring within the last 10 years: No If all of the above answers are "NO", then may proceed with Cephalosporin use.      Medication List    TAKE these medications   belladonna-opium 16.2-30 MG suppository Commonly known as:  B&O SUPPRETTES Place 1 suppository rectally every 8 (eight) hours as needed for pain.   ciprofloxacin 500 MG tablet Commonly known as:  CIPRO Take 1 tablet (500 mg  total) by mouth 2 (two) times daily.   ibuprofen 600 MG tablet Commonly known as:  ADVIL,MOTRIN Take 1 tablet (600 mg total) by mouth every 6 (six) hours as needed for mild pain or cramping.   oxyCODONE 5 MG immediate release tablet Commonly known as:  ROXICODONE Take 1 tablet (5 mg total) by mouth every 6 (six) hours as needed for breakthrough pain.    oxyCODONE-acetaminophen 5-325 MG tablet Commonly known as:  PERCOCET/ROXICET Take 2 tablets by mouth every 4 (four) hours as needed (pain scale > 7).   sertraline 100 MG tablet Commonly known as:  ZOLOFT Take 200 mg by mouth daily.   tamsulosin 0.4 MG Caps capsule Commonly known as:  FLOMAX Take 1 capsule (0.4 mg total) by mouth daily.       Followup:  Follow-up Information    Hildred Laser, MD.   Specialty:  Urology Why:  as previously scheduled Contact information: [address] [place] [address] [phone]

## 2016-10-07 ENCOUNTER — Ambulatory Visit
Admission: RE | Admit: 2016-10-07 | Discharge: 2016-10-07 | Disposition: A | Discharge status: Home / Self Care | Payer: 59 | Source: Ambulatory Visit | Attending: Urology | Admitting: Urology

## 2016-10-07 DIAGNOSIS — N200 Calculus of kidney: Secondary | ICD-10-CM | POA: Insufficient documentation

## 2016-10-23 ENCOUNTER — Ambulatory Visit: Payer: 59

## 2017-02-12 ENCOUNTER — Encounter: Payer: Self-pay | Admitting: Urology

## 2017-02-12 ENCOUNTER — Ambulatory Visit (INDEPENDENT_AMBULATORY_CARE_PROVIDER_SITE_OTHER): Payer: BC Managed Care – PPO | Admitting: Urology

## 2017-02-12 VITALS — BP 120/76 | HR 54 | Ht 73.0 in | Wt 242.9 lb

## 2017-02-12 DIAGNOSIS — N200 Calculus of kidney: Secondary | ICD-10-CM

## 2017-02-12 LAB — URINALYSIS, COMPLETE
Bilirubin, UA: NEGATIVE
Glucose, UA: NEGATIVE
Ketones, UA: NEGATIVE
LEUKOCYTES UA: NEGATIVE
Nitrite, UA: NEGATIVE
PH UA: 6 (ref 5.0–7.5)
PROTEIN UA: NEGATIVE
Specific Gravity, UA: 1.02 (ref 1.005–1.030)
Urobilinogen, Ur: 0.2 mg/dL (ref 0.2–1.0)

## 2017-02-12 LAB — MICROSCOPIC EXAMINATION

## 2017-02-12 NOTE — Progress Notes (Signed)
02/12/2017 11:19 AM   Maria Fuller 11/01/1985 [id number]  Referring provider: [facility] [address] [place], [place] [address]  No chief complaint on file.   HPI: The patient is a 31 year old female with a past medical history of recurrent nephrolithiasis who presents today with flank pain after passing a large ureteral calculus. She was having right flank pain at that time prior to passage. She did notice the stone the toilet. She took a picture which confirmed that it was a stone. She did not bring her from yesterday. She still is having intermittent right flank pain. His eyes best passing a stone, though she is still concerned she may have one present.  She does have a significant history of forming over 10 stones in the last 2 years since her gastric bypass surgery.  Her last stone was 97% calcium oxalate and 3% calcium phosphate. Her last stone surgery was in May 2018. She did have a known left lower pole stone that was not addressed at that time per patient choice. The plan admit to obtain a 24-hour urine study, however she was subsequently lost to follow-up.   PMH: Past Medical History:  Diagnosis Date  . Depression   . Diabetes mellitus without complication (HCC)    resolved with weight loss from gastric bypass  . Headache    migraines. resolved since gastric bypass  . Hypertension    resolved since gastric bypass, severe pre-eclampsia with G1  . Meningitis 2015  . PCOS (polycystic ovarian syndrome)   . Preterm labor     Surgical History: Past Surgical History:  Procedure Laterality Date  . CESAREAN SECTION    . CESAREAN SECTION N/A 05/07/2016   Procedure: CESAREAN SECTION;  Surgeon: Nadara Mustard, MD;  Location: [facility];  Service: Obstetrics;  Laterality: N/A;  Time of birth: 10:16 Sex: Female Weight: 3940 kg, 8 lb 11 oz  . CHOLECYSTECTOMY    . CYSTOSCOPY WITH STENT PLACEMENT Right 09/19/2016   Procedure: CYSTOSCOPY WITH  STENT PLACEMENT;  Surgeon: Hildred Laser, MD;  Location: [facility];  Service: Urology;  Laterality: Right;  . GASTRIC BYPASS  04/2015  . TONSILLECTOMY    . URETEROSCOPY WITH HOLMIUM LASER LITHOTRIPSY Right 09/19/2016   Procedure: URETEROSCOPY WITH HOLMIUM LASER LITHOTRIPSY;  Surgeon: Hildred Laser, MD;  Location: [facility];  Service: Urology;  Laterality: Right;    Home Medications:  Allergies as of 02/12/2017      Reactions   Cefdinir Rash   Penicillins Rash   Has patient had a PCN reaction causing immediate rash, facial/tongue/throat swelling, SOB or lightheadedness with hypotension: Yes Has patient had a PCN reaction causing severe rash involving mucus membranes or skin necrosis: No Has patient had a PCN reaction that required hospitalization No Has patient had a PCN reaction occurring within the last 10 years: No If all of the above answers are "NO", then may proceed with Cephalosporin use.      Medication List       Accurate as of 02/12/17 11:19 AM. Always use your most recent med list.          ibuprofen 600 MG tablet Commonly known as:  ADVIL,MOTRIN Take 1 tablet (600 mg total) by mouth every 6 (six) hours as needed for mild pain or cramping.   sertraline 100 MG tablet Commonly known as:  ZOLOFT Take 200 mg by mouth at bedtime.       Allergies:  Allergies  Allergen Reactions  .  Cefdinir Rash  . Penicillins Rash    Has patient had a PCN reaction causing immediate rash, facial/tongue/throat swelling, SOB or lightheadedness with hypotension: Yes Has patient had a PCN reaction causing severe rash involving mucus membranes or skin necrosis: No Has patient had a PCN reaction that required hospitalization No Has patient had a PCN reaction occurring within the last 10 years: No If all of the above answers are "NO", then may proceed with Cephalosporin use.    Family History: Family History  Problem Relation Age of Onset  . Polycystic ovary syndrome Mother     . Diabetes Father   . Endometriosis Sister   . Polycystic ovary syndrome Sister   . Breast cancer Paternal Grandmother   . Diabetes Paternal Grandmother   . Diabetes Paternal Grandfather   . Kidney cancer Neg Hx   . Prostate cancer Neg Hx   . Bladder Cancer Neg Hx     Social History:  reports that she has never smoked. She has never used smokeless tobacco. She reports that she does not drink alcohol or use drugs.  ROS: UROLOGY Frequent Urination?: Yes Hard to postpone urination?: No Burning/pain with urination?: No Get up at night to urinate?: No Leakage of urine?: No Urine stream starts and stops?: No Trouble starting stream?: No Do you have to strain to urinate?: No Blood in urine?: Yes Urinary tract infection?: No Sexually transmitted disease?: No Injury to kidneys or bladder?: No Painful intercourse?: No Weak stream?: No Currently pregnant?: No Vaginal bleeding?: No Last menstrual period?: n  Gastrointestinal Nausea?: No Vomiting?: No Indigestion/heartburn?: No Diarrhea?: No Constipation?: No  Constitutional Fever: No Night sweats?: No Weight loss?: No Fatigue?: No  Skin Skin rash/lesions?: No Itching?: No  Eyes Blurred vision?: No Double vision?: No  Ears/Nose/Throat Sore throat?: No Sinus problems?: No  Hematologic/Lymphatic Swollen glands?: No Easy bruising?: No  Cardiovascular Leg swelling?: No Chest pain?: No  Respiratory Cough?: No Shortness of breath?: No  Endocrine Excessive thirst?: No  Musculoskeletal Back pain?: Yes Joint pain?: No  Neurological Headaches?: No Dizziness?: No  Psychologic Depression?: No Anxiety?: No  Physical Exam: BP 120/76 (BP Location: Right Arm, Patient Position: Sitting, Cuff Size: Large)   Pulse (!) 54   Ht  (1.854 m)   Wt 242 lb 14.4 oz (110.2 kg)   BMI 32.05 kg/m   Constitutional:  Alert and oriented, No acute distress. HEENT: [place] AT, moist mucus membranes.  Trachea midline, no  masses. Cardiovascular: No clubbing, cyanosis, or edema. Respiratory: Normal respiratory effort, no increased work of breathing. GI: Abdomen is soft, nontender, nondistended, no abdominal masses GU: No CVA tenderness.  Skin: No rashes, bruises or suspicious lesions. Lymph: No cervical or inguinal adenopathy. Neurologic: Grossly intact, no focal deficits, moving all 4 extremities. Psychiatric: Normal mood and affect.  Laboratory Data: Lab Results  Component Value Date   WBC 5.8 09/23/2016   HGB 10.4 (L) 09/23/2016   HCT 31.2 (L) 09/23/2016   MCV 89.5 09/23/2016   PLT 296 09/23/2016    Lab Results  Component Value Date   CREATININE 0.51 09/23/2016    No results found for: PSA  No results found for: TESTOSTERONE  Lab Results  Component Value Date   HGBA1C 4.9 10/06/2015    Urinalysis    Component Value Date/Time   COLORURINE RED (A) 09/22/2016 0809   APPEARANCEUR CLOUDY (A) 09/22/2016 0809   APPEARANCEUR Cloudy (A) 09/09/2016 0833   LABSPEC 1.014 09/22/2016 0809   LABSPEC 1.033 02/08/2013 1610  PHURINE 6.0 09/22/2016 0809   GLUCOSEU NEGATIVE 09/22/2016 0809   GLUCOSEU >=500 02/08/2013 0027   HGBUR LARGE (A) 09/22/2016 0809   BILIRUBINUR NEGATIVE 09/22/2016 0809   BILIRUBINUR Negative 09/09/2016 0833   BILIRUBINUR Negative 02/08/2013 0027   KETONESUR NEGATIVE 09/22/2016 0809   PROTEINUR 100 (A) 09/22/2016 0809   UROBILINOGEN 1.0 06/26/2014 1405   NITRITE NEGATIVE 09/22/2016 0809   LEUKOCYTESUR TRACE (A) 09/22/2016 0809   LEUKOCYTESUR Trace (A) 09/09/2016 0833   LEUKOCYTESUR Negative 02/08/2013 0027     Assessment & Plan:    1. Right flank pain We'll obtain renal ultrasound to ensure no residual hydronephrosis after passing a stone. This will also let us get an idea of her stone burden. We will call her with the results.  2. Recurrent nephrolithiasis I discussed the patient's stone analysis and ABC's his stoma formation booklet. We discussed ways to  prevent further stone formation. We will arrange for her to undergo a 24-hour urine study for metabolic workup. She'll follow up after completing this study.  Return in about 6 weeks (around 03/26/2017) for after 24 hr urine study. Will call w/ renal u/s results.  Hildred Laser, MD  [facility] [address], [address] [place], [place] [address] [phone]

## 2017-02-26 ENCOUNTER — Ambulatory Visit
Admission: RE | Admit: 2017-02-26 | Discharge: 2017-02-26 | Disposition: A | Discharge status: Home / Self Care | Payer: BC Managed Care – PPO | Source: Ambulatory Visit | Attending: Urology | Admitting: Urology

## 2017-02-26 DIAGNOSIS — N200 Calculus of kidney: Secondary | ICD-10-CM | POA: Insufficient documentation

## 2017-03-08 ENCOUNTER — Encounter: Payer: Self-pay | Admitting: Urology

## 2017-04-02 ENCOUNTER — Ambulatory Visit: Payer: BC Managed Care – PPO | Admitting: Urology

## 2017-05-11 ENCOUNTER — Emergency Department: Payer: BC Managed Care – PPO

## 2017-05-11 ENCOUNTER — Other Ambulatory Visit: Payer: Self-pay

## 2017-05-11 ENCOUNTER — Encounter: Payer: Self-pay | Admitting: Emergency Medicine

## 2017-05-11 ENCOUNTER — Encounter: Payer: Self-pay | Admitting: Urology

## 2017-05-11 ENCOUNTER — Emergency Department
Admission: EM | Admit: 2017-05-11 | Discharge: 2017-05-11 | Disposition: A | Discharge status: Home / Self Care | Payer: BC Managed Care – PPO | Attending: Emergency Medicine | Admitting: Emergency Medicine

## 2017-05-11 DIAGNOSIS — R1031 Right lower quadrant pain: Secondary | ICD-10-CM | POA: Diagnosis present

## 2017-05-11 DIAGNOSIS — E119 Type 2 diabetes mellitus without complications: Secondary | ICD-10-CM | POA: Diagnosis not present

## 2017-05-11 DIAGNOSIS — Z79899 Other long term (current) drug therapy: Secondary | ICD-10-CM | POA: Diagnosis not present

## 2017-05-11 DIAGNOSIS — N390 Urinary tract infection, site not specified: Secondary | ICD-10-CM | POA: Insufficient documentation

## 2017-05-11 DIAGNOSIS — I10 Essential (primary) hypertension: Secondary | ICD-10-CM | POA: Diagnosis not present

## 2017-05-11 LAB — CBC
HEMATOCRIT: 36.2 % (ref 35.0–47.0)
Hemoglobin: 12.1 g/dL (ref 12.0–16.0)
MCH: 30.7 pg (ref 26.0–34.0)
MCHC: 33.5 g/dL (ref 32.0–36.0)
MCV: 91.7 fL (ref 80.0–100.0)
Platelets: 354 10*3/uL (ref 150–440)
RBC: 3.95 MIL/uL (ref 3.80–5.20)
RDW: 12.8 % (ref 11.5–14.5)
WBC: 8.2 10*3/uL (ref 3.6–11.0)

## 2017-05-11 LAB — URINALYSIS, COMPLETE (UACMP) WITH MICROSCOPIC
Bilirubin Urine: NEGATIVE
Glucose, UA: NEGATIVE mg/dL
Ketones, ur: NEGATIVE mg/dL
Leukocytes, UA: NEGATIVE
Nitrite: NEGATIVE
Protein, ur: NEGATIVE mg/dL
Specific Gravity, Urine: 1.02 (ref 1.005–1.030)
pH: 5 (ref 5.0–8.0)

## 2017-05-11 LAB — COMPREHENSIVE METABOLIC PANEL
ALT: 16 U/L (ref 14–54)
AST: 16 U/L (ref 15–41)
Albumin: 4.6 g/dL (ref 3.5–5.0)
Alkaline Phosphatase: 101 U/L (ref 38–126)
Anion gap: 6 (ref 5–15)
BUN: 11 mg/dL (ref 6–20)
CO2: 21 mmol/L — ABNORMAL LOW (ref 22–32)
Calcium: 8.9 mg/dL (ref 8.9–10.3)
Chloride: 114 mmol/L — ABNORMAL HIGH (ref 101–111)
Creatinine, Ser: 0.38 mg/dL — ABNORMAL LOW (ref 0.44–1.00)
GFR calc Af Amer: >60 mL/min (ref >60)
GFR calc non Af Amer: >60 mL/min (ref >60)
Glucose, Bld: 70 mg/dL (ref 65–99)
Potassium: 3.9 mmol/L (ref 3.5–5.1)
Sodium: 141 mmol/L (ref 135–145)
Total Bilirubin: 0.6 mg/dL (ref 0.3–1.2)
Total Protein: 7.4 g/dL (ref 6.5–8.1)

## 2017-05-11 LAB — POCT PREGNANCY, URINE: PREG TEST UR: NEGATIVE

## 2017-05-11 MED ORDER — KETOROLAC TROMETHAMINE 30 MG/ML IJ SOLN
30.0000 mg | Freq: Once | INTRAMUSCULAR | Status: AC
  Administered 2017-05-11: 30 mg via INTRAVENOUS

## 2017-05-11 MED ORDER — SODIUM CHLORIDE 0.9 % IV SOLN
1000.0000 mL | Freq: Once | INTRAVENOUS | Status: AC
  Administered 2017-05-11: 1000 mL via INTRAVENOUS

## 2017-05-11 MED ORDER — CIPROFLOXACIN HCL 500 MG PO TABS
500.0000 mg | ORAL_TABLET | Freq: Once | ORAL | Status: AC
  Administered 2017-05-11: 500 mg via ORAL

## 2017-05-11 MED ORDER — TRAMADOL HCL 50 MG PO TABS: 50.0000 mg | ORAL_TABLET | Freq: Four times a day (QID) | ORAL | 0 refills | Status: DC | PRN

## 2017-05-11 MED ORDER — KETOROLAC TROMETHAMINE 30 MG/ML IJ SOLN
INTRAMUSCULAR | Status: AC
  Administered 2017-05-11: 30 mg via INTRAVENOUS
  Filled 2017-05-11: qty 1

## 2017-05-11 MED ORDER — CIPROFLOXACIN HCL 500 MG PO TABS: 500.0000 mg | ORAL_TABLET | Freq: Two times a day (BID) | ORAL | 0 refills | Status: DC

## 2017-05-11 MED ORDER — CIPROFLOXACIN HCL 500 MG PO TABS
ORAL_TABLET | ORAL | Status: AC
  Administered 2017-05-11: 500 mg via ORAL
  Filled 2017-05-11: qty 1

## 2017-05-11 NOTE — ED Triage Notes (Signed)
R flank pain x 3 days. History of kidney stones.  

## 2017-05-11 NOTE — ED Notes (Signed)
Patient transported to CT 

## 2017-05-11 NOTE — ED Provider Notes (Signed)
[facility] Emergency Department Provider Note   ____________________________________________    I have reviewed the triage vital signs and the nursing notes.   HISTORY  Chief Complaint Flank Pain     HPI Maria Fuller is a 32 y.o. female with a history of kidney stones who presents with complaints of mild right flank pain as well as dysuria.  Patient reports symptoms started 1 day ago.    The pain is aching in nature.  She does have pain after she urinates as well which is more burning.  No vaginal discharge.  She has not taken anything for this, nothing makes it worse.  Does report occasional chills   Past Medical History:  Diagnosis Date  . Depression   . Diabetes mellitus without complication (HCC)    resolved with weight loss from gastric bypass  . Headache    migraines. resolved since gastric bypass  . Hypertension    resolved since gastric bypass, severe pre-eclampsia with G1  . Meningitis 2015  . PCOS (polycystic ovarian syndrome)   . Preterm labor     Patient Active Problem List   Diagnosis Date Noted  . Hydronephrosis 09/22/2016  . Nephrolithiasis 09/22/2016  . Labor and delivery, indication for care 05/05/2016  . Back pain affecting pregnancy 04/17/2016  . Labor and delivery indication for care or intervention 02/26/2016  . Abdominal pain in pregnancy 02/10/2016  . Left flank pain 12/11/2015  . 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 Hx of preeclampsia, prior pregnancy, currently pregnant 10/18/2015  . Previous cesarean delivery, antepartum condition or complication 10/18/2015  . Depression affecting pregnancy in first trimester, antepartum 10/18/2015  . Supervision of high risk pregnancy in first trimester 10/06/2015  . Right flank pain 10/06/2015  . Pyelonephritis affecting pregnancy in first trimester 10/06/2015  . Pyelonephritis complicating pregnancy 10/06/2015     Past Surgical History:  Procedure Laterality Date  . CESAREAN SECTION    . CESAREAN SECTION N/A 05/07/2016   Procedure: CESAREAN SECTION;  Surgeon: Nadara Mustard, MD;  Location: [facility];  Service: Obstetrics;  Laterality: N/A;  Time of birth: 10:16 Sex: Female Weight: 3940 kg, 8 lb 11 oz  . CHOLECYSTECTOMY    . CYSTOSCOPY WITH STENT PLACEMENT Right 09/19/2016   Procedure: CYSTOSCOPY WITH STENT PLACEMENT;  Surgeon: Hildred Laser, MD;  Location: [facility] OR[facility];  Service: Urology;  Laterality: Right;  . GASTRIC BYPASS  04/2015  . TONSILLECTOMY    . URETEROSCOPY WITH HOLMIUM LASER LITHOTRIPSY Right 09/19/2016   Procedure: URETEROSCOPY WITH HOLMIUM LASER LITHOTRIPSY;  Surgeon: Hildred Laser, MD;  Location: [facility] OR[facility];  Service: Urology;  Laterality: Right;    Prior to Admission medications   Medication Sig Start Date End Date Taking? Authorizing Provider  ciprofloxacin (CIPRO) 500 MG tablet Take 1 tablet (500 mg total) by mouth 2 (two) times daily. 05/11/17   Jene Every, MD  ibuprofen (ADVIL,MOTRIN) 600 MG tablet Take 1 tablet (600 mg total) by mouth every 6 (six) hours as needed for mild pain or cramping. 05/09/16   Conard Novak, MD  sertraline (ZOLOFT) 100 MG tablet Take 200 mg by mouth at bedtime.     [provider]  traMADol (ULTRAM) 50 MG tablet Take 1 tablet (50 mg total) by mouth every 6 (six) hours as needed. 05/11/17 05/11/18  Jene Every, MD     Allergies Cefdinir and Penicillins  Family History  Problem Relation Age of Onset  .  Polycystic ovary syndrome Mother   . Diabetes Father   . Endometriosis Sister   . Polycystic ovary syndrome Sister   . Breast cancer Paternal Grandmother   . Diabetes Paternal Grandmother   . Diabetes Paternal Grandfather   . Kidney cancer Neg Hx   . Prostate cancer Neg Hx   . Bladder Cancer Neg Hx     Social History Social History   Tobacco Use  . Smoking status: Never Smoker  . Smokeless tobacco: Never Used   Substance Use Topics  . Alcohol use: No  . Drug use: No    Review of Systems  Constitutional: No fever/chills Eyes: No visual changes.  ENT: No sore throat. Cardiovascular: Palpitations Respiratory: No cough Gastrointestinal:  No nausea, no vomiting.   Genitourinary: As above Musculoskeletal: Negative for back pain. Skin: Negative for rash. Neurological: Negative for headaches   ____________________________________________   PHYSICAL EXAM:  VITAL SIGNS: ED Triage Vitals [05/11/17 1714]  Enc Vitals Group     BP (!) 118/57     Pulse Rate (!) 58     Resp 20     Temp 98.6 F (37 C)     Temp Source Oral     SpO2 100 %     Weight 110.2 kg (243 lb)     Height 1.854 m (6\' 1" )     Head Circumference      Peak Flow      Pain Score 7     Pain Loc      Pain Edu?      Excl. in GC?     Constitutional: Alert and oriented. No acute distress. Pleasant and interactive Eyes: Conjunctivae are normal.   Nose: No congestion/rhinnorhea. Mouth/Throat: Mucous membranes are moist.    Cardiovascular: Normal rate, regular rhythm.   Good peripheral circulation. Respiratory: Normal respiratory effort.  No retractions.  Gastrointestinal: Soft and nontender. No distention.  Right CVA tenderness Genitourinary: deferred Musculoskeletal:  Warm and well perfused Neurologic:  Normal speech and language. No gross focal neurologic deficits are appreciated.  Skin:  Skin is warm, dry and intact. No rash noted. Psychiatric: Mood and affect are normal. Speech and behavior are normal.  ____________________________________________   LABS (all labs ordered are listed, but only abnormal results are displayed)  Labs Reviewed  COMPREHENSIVE METABOLIC PANEL - Abnormal; Notable for the following components:      Result Value   Chloride 114 (*)    CO2 21 (*)    Creatinine, Ser 0.38 (*)    All other components within normal limits  URINALYSIS, COMPLETE (UACMP) WITH MICROSCOPIC - Abnormal; Notable  for the following components:   Color, Urine YELLOW (*)    APPearance CLEAR (*)    Hgb urine dipstick SMALL (*)    Bacteria, UA RARE (*)    Squamous Epithelial / LPF 0-5 (*)    All other components within normal limits  CBC  POC URINE PREG, ED  POCT PREGNANCY, URINE   ____________________________________________  EKG  None ____________________________________________  RADIOLOGY  CT scan negative for stone ____________________________________________   PROCEDURES  Procedure(s) performed: No  Procedures   Critical Care performed:No ____________________________________________   INITIAL IMPRESSION / ASSESSMENT AND PLAN / ED COURSE  Pertinent labs & imaging results that were available during my care of the patient were reviewed by me and considered in my medical decision making (see chart for details).  Patient presents with mild right flank pain and dysuria.  History of kidney stones.  Differential diagnosis includes  UTI/Pilo, kidney stone, infected stone, muscular skeletal pain.  Given the risk of infected stone we will send for CT  CT is reassuring, lab work is unremarkable.  Will treat with p.o. antibiotics for urinary tract infection.  Return precautions discussed, patient is quite comfortable with this plan    ____________________________________________   FINAL CLINICAL IMPRESSION(S) / ED DIAGNOSES  Final diagnoses:  Lower urinary tract infectious disease        Note:  This document was prepared using Dragon voice recognition software and may include unintentional dictation errors.    Jene Every, MD 05/11/17 2039

## 2017-06-14 ENCOUNTER — Encounter: Payer: Self-pay | Admitting: Urology

## 2017-07-26 IMAGING — US US RENAL
1 series · 13 of 25 positions shown · non-contrast
Comparison: 09/20/2016 unenhanced CT abdomen/pelvis .

CLINICAL DATA: Right flank pain status post stent removal.

EXAM:
RENAL / URINARY TRACT ULTRASOUND COMPLETE

[Series 1: us renal · 0.24mm/px · 36 acquisitions, 13 frames shown]
[im 1/36]
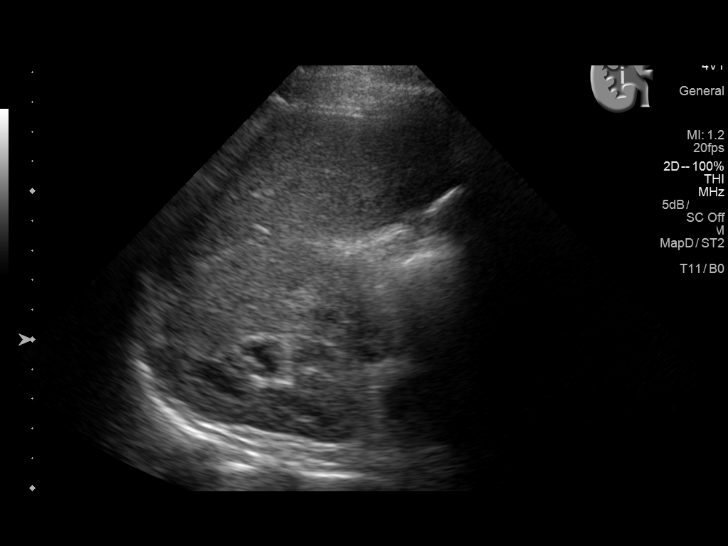
[im 3/36]
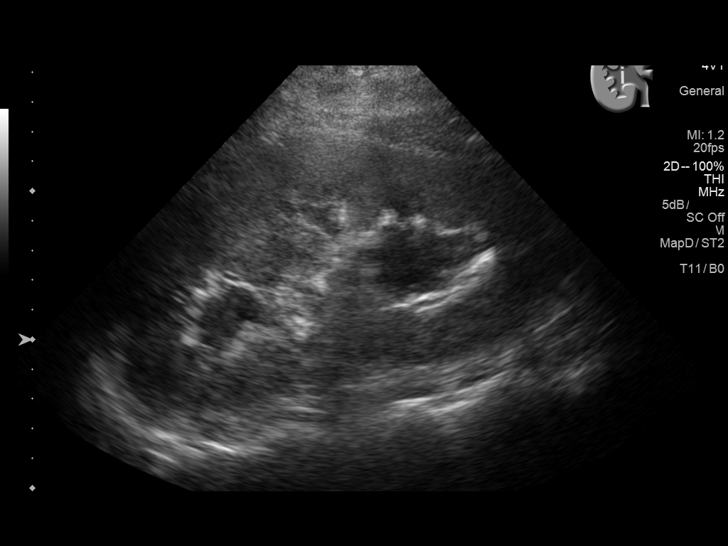
[im 6/36]
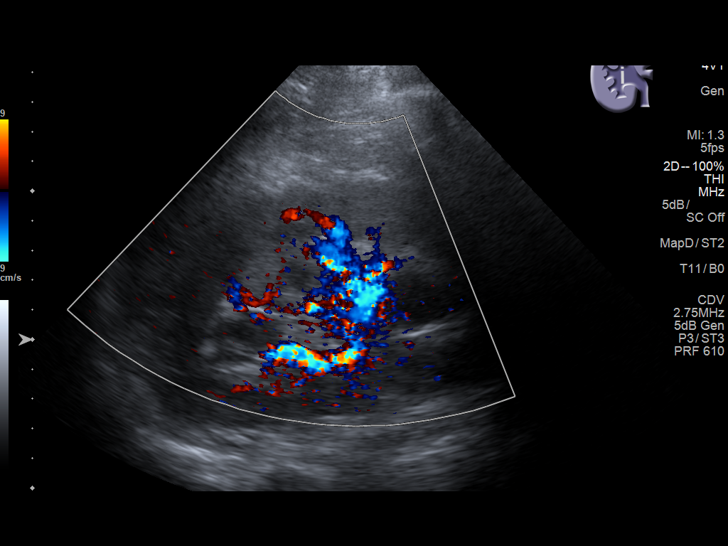
[im 9/36]
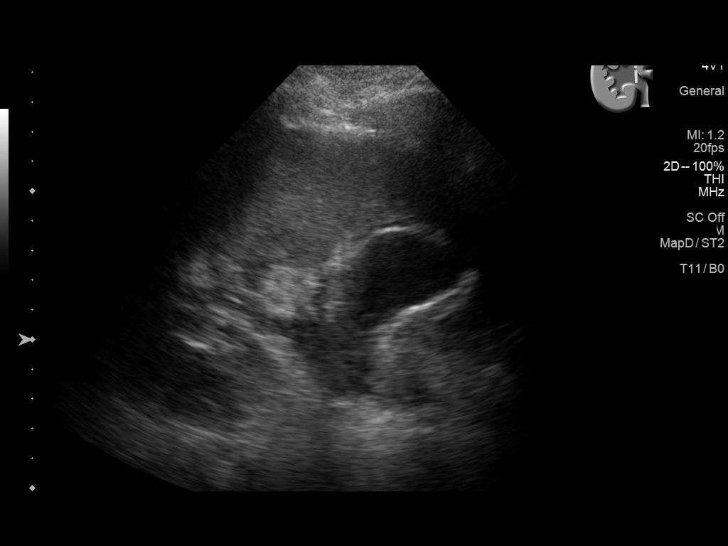
[im 12/36]
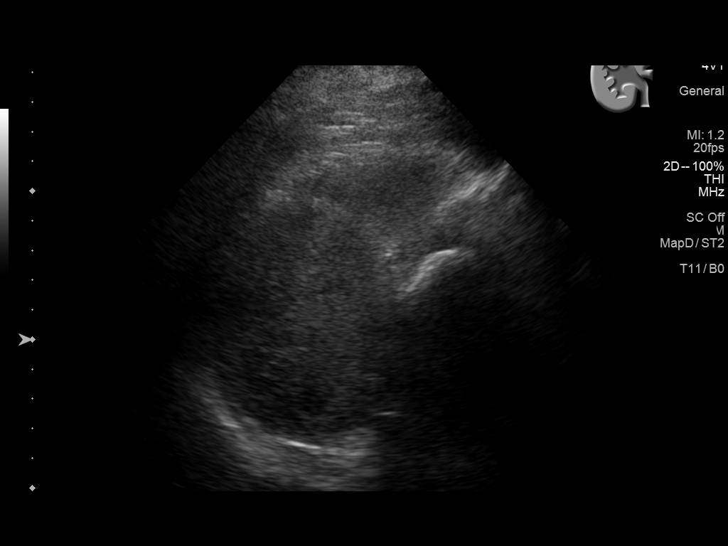
[im 15/36]
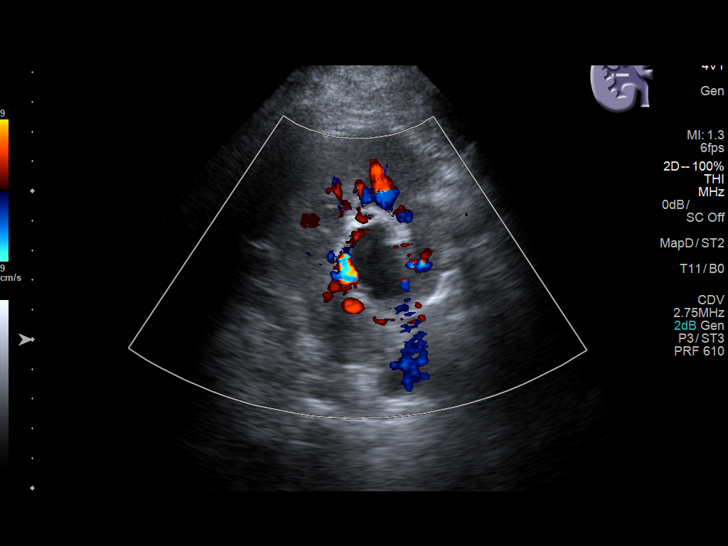
[im 18/36]
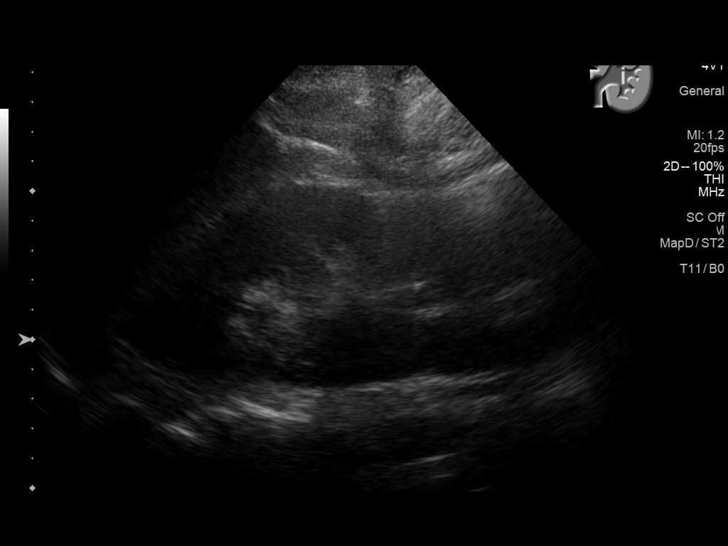
[im 21/36]
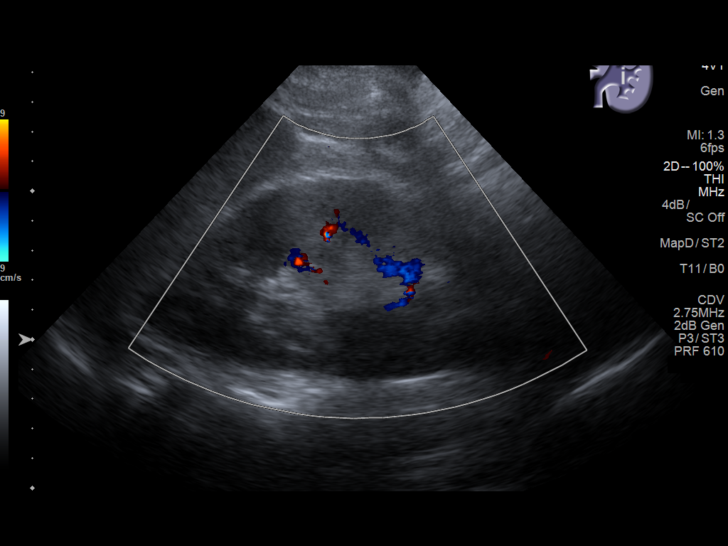
[im 24/36]
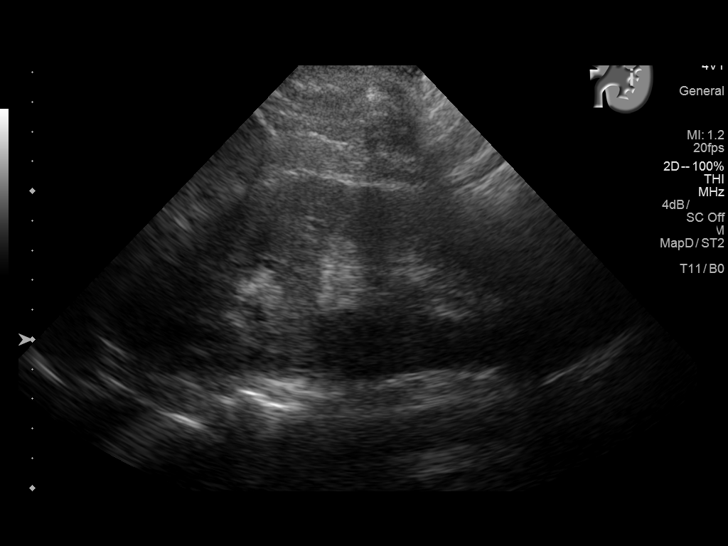
[im 27/36]
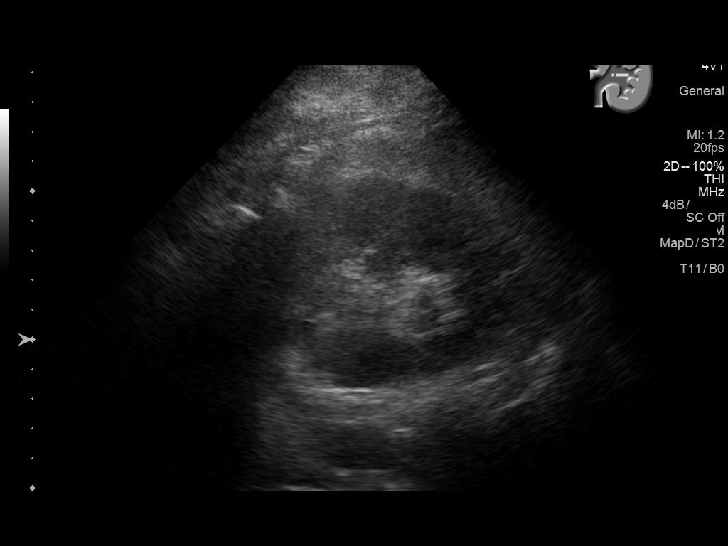
[im 30/36]
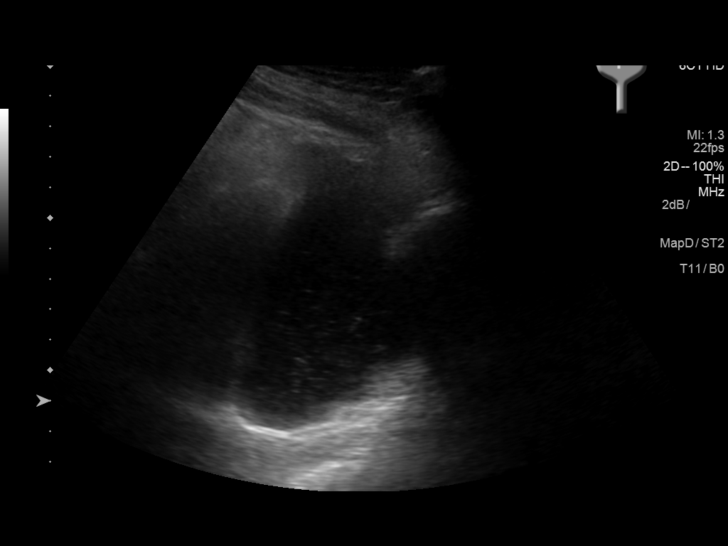
[im 33/36]
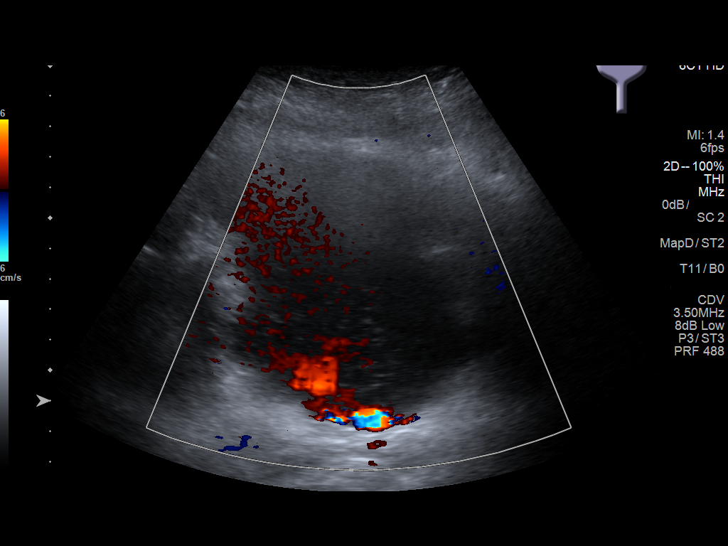
[im 36/36]
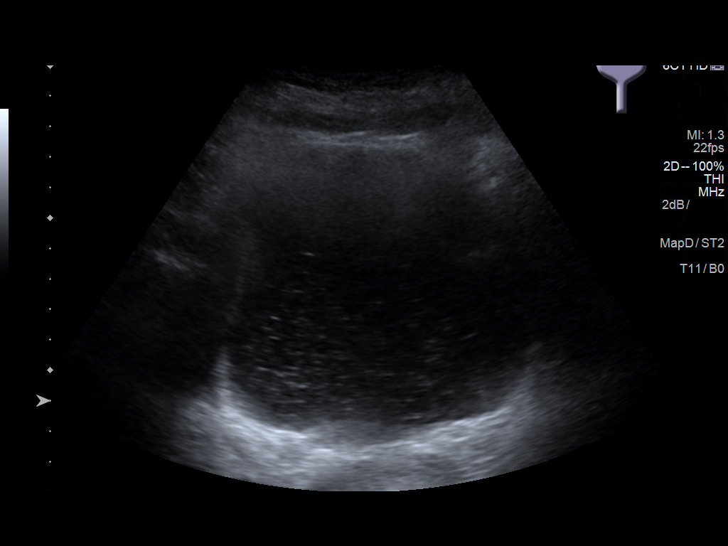

[13 of 25 positions shown; findings below may reference images not displayed]

FINDINGS: Right Kidney:

Length: 16.3 cm. Mildly echogenic right renal parenchyma. Normal
right renal parenchymal thickness. No right renal mass. Moderate
right hydronephrosis, not convincingly changed since the recent CT
study. Probable nonobstructing 5 mm stones in the mid and lower
right kidney.

Left Kidney:

Length: 15.4 cm. Echogenicity within normal limits. No mass or
hydronephrosis visualized.

Bladder:

Debris is noted layering in the bladder. No definite bladder wall
thickening. Bilateral ureteral stents are demonstrated in the
bladder.
IMPRESSION: 1. Persistent moderate right hydronephrosis, not convincingly
changed since 09/20/2016 CT. The cause of the right hydronephrosis
is not apparent by sonography. A right ureteral jet is seen in the
bladder.
2. Probable nonobstructing stones in the mid to lower right kidney.
3. Mildly echogenic right kidney, suggesting renal parenchymal
disease of uncertain chronicity.
4. Normal left kidney.  No left hydronephrosis.
5. Debris layering in the bladder, cannot exclude acute cystitis.
Correlate with urinalysis as clinically warranted.

## 2018-02-02 ENCOUNTER — Ambulatory Visit: Payer: BC Managed Care – PPO | Admitting: Obstetrics & Gynecology

## 2018-02-09 ENCOUNTER — Ambulatory Visit (INDEPENDENT_AMBULATORY_CARE_PROVIDER_SITE_OTHER): Payer: BC Managed Care – PPO | Admitting: Obstetrics & Gynecology

## 2018-02-09 ENCOUNTER — Other Ambulatory Visit: Payer: Self-pay | Admitting: Obstetrics & Gynecology

## 2018-02-09 ENCOUNTER — Encounter: Payer: Self-pay | Admitting: Obstetrics & Gynecology

## 2018-02-09 VITALS — BP 120/80 | Ht 73.0 in | Wt 264.0 lb

## 2018-02-09 DIAGNOSIS — R52 Pain, unspecified: Secondary | ICD-10-CM

## 2018-02-09 DIAGNOSIS — G8929 Other chronic pain: Secondary | ICD-10-CM | POA: Insufficient documentation

## 2018-02-09 DIAGNOSIS — R1031 Right lower quadrant pain: Secondary | ICD-10-CM

## 2018-02-09 NOTE — Progress Notes (Signed)
Gynecology Pelvic Pain Evaluation   Chief Complaint:  Chief Complaint  Patient presents with  . Pelvic Pain    right side     History of Present Illness:   Patient is a 32 y.o. Maria Fuller who LMP was No LMP recorded. (Menstrual status: IUD)., presents today for a problem visit.  She complains of pain.   Her pain is localized to the RLQ area, described as constant, began 2 mos ago and its severity is described as moderate. The pain radiates to the  back. She has these associated symptoms which include none. Patient has these modifiers which include nothing that make it better and unable to associate with any factor that make it worse.  Context includes: spontaneous.  History of kidney stones but this pain is different.  Has Mirena IUD for 18 mos and does not have periods.  Weight loss surgery was 3 years ago. H/o PCOS.  PMHx: She  has a past medical history of Depression, Diabetes mellitus without complication (HCC), Headache, Hypertension, Meningitis (2015), PCOS (polycystic ovarian syndrome), and Preterm labor. Also,  has a past surgical history that includes Cholecystectomy; Cesarean section; Tonsillectomy; Gastric bypass (04/2015); Cesarean section (N/A, 05/07/2016); Ureteroscopy with holmium laser lithotripsy (Right, 09/19/2016); and Cystoscopy with stent placement (Right, 09/19/2016)., family history includes Breast cancer in her paternal grandmother; Diabetes in her father, paternal grandfather, and paternal grandmother; Endometriosis in her sister; Polycystic ovary syndrome in her mother and sister.,  reports that she has never smoked. She has never used smokeless tobacco. She reports that she does not drink alcohol or use drugs.  She has a current medication list which includes the following prescription(s): ciprofloxacin, ibuprofen, sertraline, and tramadol. Also, is allergic to cefdinir and penicillins.  Review of Systems  Constitutional: Negative for chills, fever and malaise/fatigue.    HENT: Negative for congestion, sinus pain and sore throat.   Eyes: Negative for blurred vision and pain.  Respiratory: Negative for cough and wheezing.   Cardiovascular: Negative for chest pain and leg swelling.  Gastrointestinal: Negative for abdominal pain, constipation, diarrhea, heartburn, nausea and vomiting.  Genitourinary: Negative for dysuria, frequency, hematuria and urgency.  Musculoskeletal: Negative for back pain, joint pain, myalgias and neck pain.  Skin: Negative for itching and rash.  Neurological: Negative for dizziness, tremors and weakness.  Endo/Heme/Allergies: Does not bruise/bleed easily.  Psychiatric/Behavioral: Negative for depression. The patient is not nervous/anxious and does not have insomnia.     Objective: BP 120/80   Ht 6\' 1"  (1.854 m)   Wt 264 lb (119.7 kg)   BMI 34.83 kg/m  Physical Exam  Constitutional: She is oriented to person, place, and time. She appears well-developed and well-nourished. No distress.  Genitourinary: Vagina normal and uterus normal. Pelvic exam was performed with patient supine. There is no rash, tenderness or lesion on the right labia. There is no rash, tenderness or lesion on the left labia. No erythema or bleeding in the vagina. Right adnexum does not display mass and does not display tenderness. Left adnexum does not display mass and does not display tenderness. Cervix does not exhibit motion tenderness, discharge, polyp or nabothian cyst.   Uterus is mobile and midaxial. Uterus is not enlarged or exhibiting a mass.  HENT:  Head: Normocephalic and atraumatic.  Nose: Nose normal.  Mouth/Throat: Oropharynx is clear and moist.  Abdominal: Soft. She exhibits no distension. There is tenderness in the right lower quadrant. There is no rigidity, no rebound, no guarding, no tenderness at McBurney's point and  negative Murphy's sign.  Musculoskeletal: Normal range of motion.  Neurological: She is alert and oriented to person, place, and  time. No cranial nerve deficit.  Skin: Skin is warm and dry.  Psychiatric: She has a normal mood and affect.   Female chaperone present for pelvic portion of the physical exam  Assessment: 32 y.o. Maria Fuller with :1. RLQ abdominal pain Etiology to consider includes ovarian cyst, fibroid, adhesions, IUD malposition, GI, or GU causes.  Plan pelvic US to assess RLQ and IUD NSAIDs  Annamarie Major, MD, [facility], [facility] 02/09/2018  4:33 PM

## 2018-02-10 ENCOUNTER — Ambulatory Visit (INDEPENDENT_AMBULATORY_CARE_PROVIDER_SITE_OTHER): Payer: BC Managed Care – PPO

## 2018-02-10 ENCOUNTER — Encounter: Payer: Self-pay | Admitting: Obstetrics & Gynecology

## 2018-02-10 ENCOUNTER — Ambulatory Visit (INDEPENDENT_AMBULATORY_CARE_PROVIDER_SITE_OTHER): Payer: BC Managed Care – PPO | Admitting: Obstetrics & Gynecology

## 2018-02-10 VITALS — BP 120/80 | Ht 73.0 in | Wt 262.0 lb

## 2018-02-10 DIAGNOSIS — N8302 Follicular cyst of left ovary: Secondary | ICD-10-CM

## 2018-02-10 DIAGNOSIS — Z30432 Encounter for removal of intrauterine contraceptive device: Secondary | ICD-10-CM | POA: Diagnosis not present

## 2018-02-10 DIAGNOSIS — T8332XA Displacement of intrauterine contraceptive device, initial encounter: Secondary | ICD-10-CM

## 2018-02-10 DIAGNOSIS — R52 Pain, unspecified: Secondary | ICD-10-CM

## 2018-02-10 DIAGNOSIS — T8332XD Displacement of intrauterine contraceptive device, subsequent encounter: Secondary | ICD-10-CM

## 2018-02-10 DIAGNOSIS — R1031 Right lower quadrant pain: Secondary | ICD-10-CM

## 2018-02-10 NOTE — Progress Notes (Signed)
HPI: Pt has had 2 month h/o RLQ pain .  Her pain is localized to the RLQ area, described as constant, began 2 mos ago and its severity is described as moderate. The pain radiates to the  back. She has these associated symptoms which include none. Patient has these modifiers which include nothing that make it better and unable to associate with any factor that make it worse.  Context includes: spontaneous.  History of kidney stones but this pain is different.  Has Mirena IUD for 18 mos and does not have periods.  Weight loss surgery was 3 years ago. H/o PCOS. Ultrasound demonstrates PCOS ovaries and malposition of IUD, see below  PMHx: She  has a past medical history of Depression, Diabetes mellitus without complication (HCC), Headache, Hypertension, Meningitis (2015), PCOS (polycystic ovarian syndrome), and Preterm labor. Also,  has a past surgical history that includes Cholecystectomy; Cesarean section; Tonsillectomy; Gastric bypass (04/2015); Cesarean section (N/A, 05/07/2016); Ureteroscopy with holmium laser lithotripsy (Right, 09/19/2016); and Cystoscopy with stent placement (Right, 09/19/2016)., family history includes Breast cancer in her paternal grandmother; Diabetes in her father, paternal grandfather, and paternal grandmother; Endometriosis in her sister; Polycystic ovary syndrome in her mother and sister.,  reports that she has never smoked. She has never used smokeless tobacco. She reports that she does not drink alcohol or use drugs.  She has a current medication list which includes the following prescription(s): ciprofloxacin, ibuprofen, sertraline, and tramadol. Also, is allergic to cefdinir and penicillins.  Review of Systems  All other systems reviewed and are negative.   Objective: BP 120/80   Ht 6\' 1"  (1.854 m)   Wt 262 lb (118.8 kg)   BMI 34.57 kg/m   Physical examination Constitutional NAD, Conversant  Skin No rashes, lesions or ulceration.   Extremities: Moves all  appropriately.  Normal ROM for age. No lymphadenopathy.  Neuro: Grossly intact  Psych: Oriented to PPT.  Normal mood. Normal affect.   US Pelvis Transvanginal Non-ob (tv Only)  Result Date: 02/10/2018 Patient Name: Maria Fuller DOB: 1985-06-18 MRN: [id number] ULTRASOUND REPORT Location: [facility] Date of Service: 02/10/2018 Indications: Right lower pelvic pain Findings: The uterus is anteverted and measures 7.2 x 5.9 x 4.3cm. Echo texture is homogenous without evidence of focal masses. The Endometrium measures 2.7 mm with trace amount of fluid within. Malposition of IUD. Not at fundus Right Ovary measures 3.6 x 2.6 x 2.1 cm. Left Ovary measures 4.9 x 4.2 x 1.7 cm with dominant follicle. Bilateral ovaries have multiple peripheral follicles suggesting ultrasound appearance of PCOS. Survey of the adnexa demonstrates no adnexal masses. There is no free fluid in the cul de sac. Impression: 1. Malposition of IUD Recommendations: 1.Clinical correlation with the patient's History and Physical Exam. 2. IUD discussion/ adjustment/ replacement Abby Vanessa Balfour, RDMS RVT Review of ULTRASOUND.    I have personally reviewed images and report of recent ultrasound done at [facility].    Plan of management to be discussed with patient. Annamarie Major, MD, FACOG [facility], [facility] 02/10/2018  1:28 PM   Assessment:  RLQ abdominal pain  Malpositioned intrauterine device (IUD), subsequent encounter   Maria Fuller is a 32 y.o. that had a Mirena IUD placed approximately 18  months ago. Since that time, she states that she was fine until 2 mos ago w RLQ pain. [place] today shows potential malposition.  IUD Removal Strings of IUD identified and grasped.  IUD removed without problem.  Pt tolerated this well.  IUD noted to be intact.  Assessment: IUD Removal  Plan: IUD removed and plan for contraception is oral contraceptives (estrogen/progesterone).  Re-assess in one month. Consider re-insertion as  this is her favored form of contraception. Prior intolerence to Nexplanon, concern for weight gain w Depo. She was amenable to this plan.  Annamarie Major, MD, [facility], [facility] 02/10/2018  2:35 PM

## 2018-02-14 ENCOUNTER — Encounter: Payer: Self-pay | Admitting: Obstetrics & Gynecology

## 2018-03-03 ENCOUNTER — Emergency Department: Payer: BC Managed Care – PPO

## 2018-03-03 ENCOUNTER — Emergency Department
Admission: EM | Admit: 2018-03-03 | Discharge: 2018-03-03 | Disposition: A | Discharge status: Home / Self Care | Payer: BC Managed Care – PPO | Attending: Student in an Organized Health Care Education/Training Program | Admitting: Student in an Organized Health Care Education/Training Program

## 2018-03-03 ENCOUNTER — Other Ambulatory Visit: Payer: Self-pay

## 2018-03-03 DIAGNOSIS — Z79899 Other long term (current) drug therapy: Secondary | ICD-10-CM | POA: Insufficient documentation

## 2018-03-03 DIAGNOSIS — R319 Hematuria, unspecified: Secondary | ICD-10-CM | POA: Diagnosis not present

## 2018-03-03 DIAGNOSIS — R1031 Right lower quadrant pain: Secondary | ICD-10-CM | POA: Diagnosis present

## 2018-03-03 DIAGNOSIS — N200 Calculus of kidney: Secondary | ICD-10-CM | POA: Insufficient documentation

## 2018-03-03 DIAGNOSIS — R55 Syncope and collapse: Secondary | ICD-10-CM | POA: Diagnosis not present

## 2018-03-03 DIAGNOSIS — R109 Unspecified abdominal pain: Secondary | ICD-10-CM

## 2018-03-03 LAB — COMPREHENSIVE METABOLIC PANEL
ALK PHOS: 62 U/L (ref 38–126)
ALT: 18 U/L (ref 0–44)
ANION GAP: 6 (ref 5–15)
AST: 15 U/L (ref 15–41)
Albumin: 4 g/dL (ref 3.5–5.0)
BUN: 10 mg/dL (ref 6–20)
CALCIUM: 8.1 mg/dL — AB (ref 8.9–10.3)
CO2: 24 mmol/L (ref 22–32)
Chloride: 110 mmol/L (ref 98–111)
Creatinine, Ser: 0.5 mg/dL (ref 0.44–1.00)
GFR calc non Af Amer: >60 mL/min (ref >60)
Glucose, Bld: 91 mg/dL (ref 70–99)
Potassium: 3.4 mmol/L — ABNORMAL LOW (ref 3.5–5.1)
Sodium: 140 mmol/L (ref 135–145)
TOTAL PROTEIN: 6.8 g/dL (ref 6.5–8.1)
Total Bilirubin: 0.8 mg/dL (ref 0.3–1.2)

## 2018-03-03 LAB — URINALYSIS, COMPLETE (UACMP) WITH MICROSCOPIC
Bacteria, UA: NONE SEEN
Bilirubin Urine: NEGATIVE
GLUCOSE, UA: NEGATIVE mg/dL
KETONES UR: NEGATIVE mg/dL
Leukocytes, UA: NEGATIVE
NITRITE: NEGATIVE
PH: 6 (ref 5.0–8.0)
Protein, ur: NEGATIVE mg/dL
Specific Gravity, Urine: 1.006 (ref 1.005–1.030)

## 2018-03-03 LAB — TROPONIN I

## 2018-03-03 LAB — CBC
HCT: 33.6 % — ABNORMAL LOW (ref 36.0–46.0)
Hemoglobin: 11.2 g/dL — ABNORMAL LOW (ref 12.0–15.0)
MCH: 30.2 pg (ref 26.0–34.0)
MCHC: 33.3 g/dL (ref 30.0–36.0)
MCV: 90.6 fL (ref 80.0–100.0)
NRBC: 0 % (ref 0.0–0.2)
Platelets: 352 10*3/uL (ref 150–400)
RBC: 3.71 MIL/uL — AB (ref 3.87–5.11)
RDW: 11.9 % (ref 11.5–15.5)
WBC: 6.8 10*3/uL (ref 4.0–10.5)

## 2018-03-03 LAB — TSH: TSH: 1.858 u[IU]/mL (ref 0.350–4.500)

## 2018-03-03 LAB — LIPASE, BLOOD: LIPASE: 23 U/L (ref 11–51)

## 2018-03-03 LAB — POCT PREGNANCY, URINE: Preg Test, Ur: NEGATIVE

## 2018-03-03 MED ORDER — MECLIZINE HCL 12.5 MG PO TABS: 12.5000 mg | ORAL_TABLET | Freq: Three times a day (TID) | ORAL | 0 refills | Status: DC | PRN

## 2018-03-03 MED ORDER — SODIUM CHLORIDE 0.9 % IV BOLUS
1000.0000 mL | Freq: Once | INTRAVENOUS | Status: AC
  Administered 2018-03-03: 1000 mL via INTRAVENOUS

## 2018-03-03 MED ORDER — MECLIZINE HCL 25 MG PO TABS
25.0000 mg | ORAL_TABLET | Freq: Once | ORAL | Status: AC
  Administered 2018-03-03: 25 mg via ORAL
  Filled 2018-03-03: qty 1

## 2018-03-03 NOTE — ED Notes (Signed)
Called lab to add on TSH

## 2018-03-03 NOTE — ED Notes (Signed)
Per Dr Roxan Hockey allow pt 15 minutes after meclizine and then ambulate - if able to ambulate discharge at that time

## 2018-03-03 NOTE — ED Provider Notes (Addendum)
[facility] Emergency Department Provider Note    First MD Initiated Contact with Patient 03/03/18 1134     (approximate)  I have reviewed the triage vital signs and the nursing notes.   HISTORY  Chief Complaint Flank Pain    HPI Maria Fuller is a 32 y.o. female chief complaint of right flank pain with some hematuria over the weekend.  States that she was at work was in the bathroom and started feeling hot and flushed and passed out.  Uncertain if she completely lost consciousness but was on the floor.  Staff called 911.  Denies any pain at this time was having severe pain felt similar to previous kidney stones.  Has required lithotripsy in the past.  Denies any chest pain or palpitations.  Denies any nausea or vomiting.  No dysuria.    Past Medical History:  Diagnosis Date  . Depression   . Diabetes mellitus without complication (HCC)    resolved with weight loss from gastric bypass  . Headache    migraines. resolved since gastric bypass  . Hypertension    resolved since gastric bypass, severe pre-eclampsia with G1  . Meningitis 2015  . PCOS (polycystic ovarian syndrome)   . Preterm labor    Family History  Problem Relation Age of Onset  . Polycystic ovary syndrome Mother   . Diabetes Father   . Endometriosis Sister   . Polycystic ovary syndrome Sister   . Breast cancer Paternal Grandmother   . Diabetes Paternal Grandmother   . Diabetes Paternal Grandfather   . Kidney cancer Neg Hx   . Prostate cancer Neg Hx   . Bladder Cancer Neg Hx    Past Surgical History:  Procedure Laterality Date  . CESAREAN SECTION    . CESAREAN SECTION N/A 05/07/2016   Procedure: CESAREAN SECTION;  Surgeon: Nadara Mustard, MD;  Location: [facility] ORS;  Service: Obstetrics;  Laterality: N/A;  Time of birth: 10:16 Sex: Female Weight: 3940 kg, 8 lb 11 oz  . CHOLECYSTECTOMY    . CYSTOSCOPY WITH STENT PLACEMENT Right 09/19/2016   Procedure: CYSTOSCOPY WITH STENT  PLACEMENT;  Surgeon: Hildred Laser, MD;  Location: [facility] ORS;  Service: Urology;  Laterality: Right;  . GASTRIC BYPASS  04/2015  . TONSILLECTOMY    . URETEROSCOPY WITH HOLMIUM LASER LITHOTRIPSY Right 09/19/2016   Procedure: URETEROSCOPY WITH HOLMIUM LASER LITHOTRIPSY;  Surgeon: Hildred Laser, MD;  Location: [facility] ORS;  Service: Urology;  Laterality: Right;   Patient Active Problem List   Diagnosis Date Noted  . RLQ abdominal pain 02/09/2018  . Hydronephrosis 09/22/2016  . Nephrolithiasis 09/22/2016  . Labor and delivery, indication for care 05/05/2016  . Back pain affecting pregnancy 04/17/2016  . Labor and delivery indication for care or intervention 02/26/2016  . Abdominal pain in pregnancy 02/10/2016  . Left flank pain 12/11/2015  . 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 Hx of preeclampsia, prior pregnancy, currently pregnant 10/18/2015  . Previous cesarean delivery, antepartum condition or complication 10/18/2015  . Depression affecting pregnancy in first trimester, antepartum 10/18/2015  . Supervision of high risk pregnancy in first trimester 10/06/2015  . Right flank pain 10/06/2015  . Pyelonephritis affecting pregnancy in first trimester 10/06/2015  . Pyelonephritis complicating pregnancy 10/06/2015      Prior to Admission medications   Medication Sig Start Date End Date Taking? Authorizing Provider  ciprofloxacin (CIPRO) 500 MG tablet Take 1 tablet (500 mg total) by mouth  2 (two) times daily. 05/11/17   Jene Every, MD  ibuprofen (ADVIL,MOTRIN) 600 MG tablet Take 1 tablet (600 mg total) by mouth every 6 (six) hours as needed for mild pain or cramping. 05/09/16   Conard Novak, MD  sertraline (ZOLOFT) 100 MG tablet Take 200 mg by mouth at bedtime.     [provider]  traMADol (ULTRAM) 50 MG tablet Take 1 tablet (50 mg total) by mouth every 6 (six) hours as needed. 05/11/17 05/11/18  Jene Every, MD     Allergies Ceftin [cefuroxime axetil] and Penicillins    Social History Social History   Tobacco Use  . Smoking status: Never Smoker  . Smokeless tobacco: Never Used  Substance Use Topics  . Alcohol use: No  . Drug use: No    Review of Systems Patient denies headaches, rhinorrhea, blurry vision, numbness, shortness of breath, chest pain, edema, cough, abdominal pain, nausea, vomiting, diarrhea, dysuria, fevers, rashes or hallucinations unless otherwise stated above in HPI. ____________________________________________   PHYSICAL EXAM:  VITAL SIGNS: Vitals:   03/03/18 1109 03/03/18 1440  BP: 125/72 130/74  Pulse: (!) 53 (!) 52  Resp: 16 16  Temp: 98.4 F (36.9 C)   SpO2: 100% 100%    Constitutional: Alert and oriented.  Eyes: Conjunctivae are normal.  Head: Atraumatic. Nose: No congestion/rhinnorhea. Mouth/Throat: Mucous membranes are moist.   Neck: No stridor. Painless ROM.  Cardiovascular: Normal rate, regular rhythm. Grossly normal heart sounds.  Good peripheral circulation. Respiratory: Normal respiratory effort.  No retractions. Lungs CTAB. Gastrointestinal: Soft and nontender. No distention. No abdominal bruits. No CVA tenderness. Genitourinary:  Musculoskeletal: No lower extremity tenderness nor edema.  No joint effusions. Neurologic:  Normal speech and language. No gross focal neurologic deficits are appreciated. No facial droop Skin:  Skin is warm, dry and intact. No rash noted. Psychiatric: Mood and affect are normal. Speech and behavior are normal.  ____________________________________________   LABS (all labs ordered are listed, but only abnormal results are displayed)  Results for orders placed or performed during the hospital encounter of 03/03/18 (from the past 24 hour(s))  Urinalysis, Complete w Microscopic     Status: Abnormal   Collection Time: 03/03/18 11:12 AM  Result Value Ref Range   Color, Urine YELLOW (A) YELLOW   APPearance CLEAR  (A) CLEAR   Specific Gravity, Urine 1.006 1.005 - 1.030   pH 6.0 5.0 - 8.0   Glucose, UA NEGATIVE NEGATIVE mg/dL   Hgb urine dipstick SMALL (A) NEGATIVE   Bilirubin Urine NEGATIVE NEGATIVE   Ketones, ur NEGATIVE NEGATIVE mg/dL   Protein, ur NEGATIVE NEGATIVE mg/dL   Nitrite NEGATIVE NEGATIVE   Leukocytes, UA NEGATIVE NEGATIVE   RBC / HPF 0-5 0 - 5 RBC/hpf   WBC, UA 0-5 0 - 5 WBC/hpf   Bacteria, UA NONE SEEN NONE SEEN   Squamous Epithelial / LPF 0-5 0 - 5   Mucus PRESENT   Comprehensive metabolic panel     Status: Abnormal   Collection Time: 03/03/18 11:20 AM  Result Value Ref Range   Sodium 140 135 - 145 mmol/L   Potassium 3.4 (L) 3.5 - 5.1 mmol/L   Chloride 110 98 - 111 mmol/L   CO2 24 22 - 32 mmol/L   Glucose, Bld 91 70 - 99 mg/dL   BUN 10 6 - 20 mg/dL   Creatinine, Ser 4.09 0.44 - 1.00 mg/dL   Calcium 8.1 (L) 8.9 - 10.3 mg/dL   Total Protein 6.8 6.5 - 8.1  g/dL   Albumin 4.0 3.5 - 5.0 g/dL   AST 15 15 - 41 U/L   ALT 18 0 - 44 U/L   Alkaline Phosphatase 62 38 - 126 U/L   Total Bilirubin 0.8 0.3 - 1.2 mg/dL   GFR calc non Af Amer >60 >60 mL/min   GFR calc Af Amer >60 >60 mL/min   Anion gap 6 5 - 15  CBC     Status: Abnormal   Collection Time: 03/03/18 11:20 AM  Result Value Ref Range   WBC 6.8 4.0 - 10.5 K/uL   RBC 3.71 (L) 3.87 - 5.11 MIL/uL   Hemoglobin 11.2 (L) 12.0 - 15.0 g/dL   HCT 09.8 (L) 11.9 - 14.7 %   MCV 90.6 80.0 - 100.0 fL   MCH 30.2 26.0 - 34.0 pg   MCHC 33.3 30.0 - 36.0 g/dL   RDW 82.9 56.2 - 13.0 %   Platelets 352 150 - 400 K/uL   nRBC 0.0 0.0 - 0.2 %  Lipase, blood     Status: None   Collection Time: 03/03/18 11:20 AM  Result Value Ref Range   Lipase 23 11 - 51 U/L  Pregnancy, urine POC     Status: None   Collection Time: 03/03/18 12:04 PM  Result Value Ref Range   Preg Test, Ur NEGATIVE NEGATIVE  TSH     Status: None   Collection Time: 03/03/18 12:06 PM  Result Value Ref Range   TSH 1.858 0.350 - 4.500 uIU/mL  Troponin I     Status: None    Collection Time: 03/03/18  2:33 PM  Result Value Ref Range   Troponin I <0.03 <0.03 ng/mL   ____________________________________________  EKG My review and personal interpretation at Time: 11:11   Indication: near syncope  Rate: 50  Rhythm: sinus Axis: normal Other: normal intervals, no stemi ____________________________________________  RADIOLOGY  I personally reviewed all radiographic images ordered to evaluate for the above acute complaints and reviewed radiology reports and findings.  These findings were personally discussed with the patient.  Please see medical record for radiology report.  ____________________________________________   PROCEDURES  Procedure(s) performed:  Procedures    Critical Care performed: no ____________________________________________   INITIAL IMPRESSION / ASSESSMENT AND PLAN / ED COURSE  Pertinent labs & imaging results that were available during my care of the patient were reviewed by me and considered in my medical decision making (see chart for details).   DDX: Dehydration, vasovagal, electrolyte abnormality, hypothyroid, UTI, stone, dysrhythmia, unlikely ACS or PE.  Shelsy Seng Muenchow is a 32 y.o. who presents to the ED with symptoms as described above.  Patient is afebrile hemodynamically stable.  Blood work will be sent for the above differential.  Initial EKG shows no evidence of preexcitation syndrome or acute ischemia.  Abdominal exam soft and benign.  Will ultrasound to evaluate for stone given her history.  Her abdominal exam is otherwise soft and benign.  Clinical Course as of Mar 03 1605  Wed Mar 03, 2018  1442 Patient with fairly sizable left renal stone but no obstructive pathology on the right.  No evidence of infectious process on urine.  Will plan for repeat troponin to further stratify though this does seem most clinically consistent with vasovagal episode.   [PR]  1455 Anion gap: 6 [PR]    Clinical Course User Index [PR]  Willy Eddy, MD   Patient will be signed out to oncoming physician pending repeat troponin.  Assuming negative patient  will be stable and appropriate for outpatient follow-up.  ----------------------------------------- 4:38 PM on 03/03/2018 -----------------------------------------  Patient now complaining of dizziness and states that she has been feeling dizzy since arriving.  Repeat neuro exam is nonfocal.  She is negative hence exam.  Do suspect some component of peripheral vertigo.  Not consistent with CVA.  Will give meclizine p.o. challenge and if able to ambulate will have follow-up with PCP.  As part of my medical decision making, I reviewed the following data within the electronic MEDICAL RECORD NUMBER Nursing notes reviewed and incorporated, Labs reviewed, notes from prior ED visits and [place] Controlled Substance Database   ____________________________________________   FINAL CLINICAL IMPRESSION(S) / ED DIAGNOSES  Final diagnoses:  Syncope, unspecified syncope type  Flank pain      NEW MEDICATIONS STARTED DURING THIS VISIT:  New Prescriptions   No medications on file     Note:  This document was prepared using Dragon voice recognition software and may include unintentional dictation errors.    Willy Eddy, MD 03/03/18 1608    Willy Eddy, MD 03/03/18 [phone]

## 2018-03-03 NOTE — ED Notes (Signed)
Pt tolerated ambulation well, minimal assist

## 2018-03-03 NOTE — ED Triage Notes (Signed)
Pt arrived via ems - she c/o right flank pain and history of kidney stones and hematuria over the weekend - today she was at work and became hot, flushed and weak and laid down in the bathroom floor and they staff called 911

## 2018-03-03 NOTE — Discharge Instructions (Signed)
Please drink plenty of fluid to stay well-hydrated.  You may take Tylenol or Motrin for your pain.  Follow-up with your primary care physician.  Return to the emergency department if you develop severe pain, lightheadedness or fainting, fever, or any other symptoms concerning to you.

## 2018-03-10 ENCOUNTER — Encounter: Payer: Self-pay | Admitting: Obstetrics & Gynecology

## 2018-03-10 ENCOUNTER — Ambulatory Visit (INDEPENDENT_AMBULATORY_CARE_PROVIDER_SITE_OTHER): Payer: BC Managed Care – PPO | Admitting: Obstetrics & Gynecology

## 2018-03-10 VITALS — BP 130/90 | Ht 73.0 in | Wt 259.0 lb

## 2018-03-10 DIAGNOSIS — E282 Polycystic ovarian syndrome: Secondary | ICD-10-CM

## 2018-03-10 DIAGNOSIS — R102 Pelvic and perineal pain: Secondary | ICD-10-CM

## 2018-03-10 NOTE — Progress Notes (Signed)
   Gynecology Pelvic Pain Evaluation   Chief Complaint: Pain  History of Present Illness:   Patient is a 32 y.o. [id number] who LMP was No LMP recorded. (Menstrual status: Other)., presents today for a problem visit.  She complains of no period since IUD removed (none w either).  One month ago.  Still has same lower quadrant pain generalized without radiation.  No change in weight.  Not sex active.Marland Kitchen   PMHx: She  has a past medical history of Bradycardia, Depression, Diabetes mellitus without complication (HCC), Headache, Hypertension, Meningitis (2015), PCOS (polycystic ovarian syndrome), Preterm labor, and Syncope. Also,  has a past surgical history that includes Cholecystectomy; Cesarean section; Tonsillectomy; Gastric bypass (04/2015); Cesarean section (N/A, 05/07/2016); Ureteroscopy with holmium laser lithotripsy (Right, 09/19/2016); and Cystoscopy with stent placement (Right, 09/19/2016)., family history includes Breast cancer in her paternal grandmother; Diabetes in her father, paternal grandfather, and paternal grandmother; Endometriosis in her sister; Polycystic ovary syndrome in her mother and sister.,  reports that she has never smoked. She has never used smokeless tobacco. She reports that she does not drink alcohol or use drugs.  She has a current medication list which includes the following prescription(s): meclizine, sertraline, ciprofloxacin, ibuprofen, and tramadol. Also, is allergic to ceftin [cefuroxime axetil] and penicillins.  Review of Systems  Constitutional: Negative for chills, fever and malaise/fatigue.  HENT: Negative for congestion, sinus pain and sore throat.   Eyes: Negative for blurred vision and pain.  Respiratory: Negative for cough and wheezing.   Cardiovascular: Negative for chest pain and leg swelling.  Gastrointestinal: Negative for abdominal pain, constipation, diarrhea, heartburn, nausea and vomiting.  Genitourinary: Negative for dysuria, frequency, hematuria and  urgency.  Musculoskeletal: Negative for back pain, joint pain, myalgias and neck pain.  Skin: Negative for itching and rash.  Neurological: Negative for dizziness, tremors and weakness.  Endo/Heme/Allergies: Does not bruise/bleed easily.  Psychiatric/Behavioral: Negative for depression. The patient is not nervous/anxious and does not have insomnia.     Objective: BP 130/90   Ht 6\' 1"  (1.854 m)   Wt 259 lb (117.5 kg)   BMI 34.17 kg/m  Physical Exam  Constitutional: She is oriented to person, place, and time. She appears well-developed and well-nourished. No distress.  Musculoskeletal: Normal range of motion.  Neurological: She is alert and oriented to person, place, and time.  Skin: Skin is warm and dry.  Psychiatric: She has a normal mood and affect.  Vitals reviewed. Abd: NT, ND no mass  Assessment: 32 y.o. [id number] with Pelvic pain and PCOS.  Oligomenorrhea..  1. PCOS (polycystic ovarian syndrome) Allow more time of of hormones.  No need for [facility].  See if pain resolves w next cycle.  Needs cycle in 3 mos or will need withdrawal meds.  2. Pelvic pain  A total of 15 minutes were spent face-to-face with the patient during this encounter and over half of that time dealt with counseling and coordination of care.  Annamarie Major, MD, [facility], [facility] 03/10/2018  4:04 PM

## 2018-04-07 ENCOUNTER — Ambulatory Visit
Admission: RE | Admit: 2018-04-07 | Discharge: 2018-04-07 | Disposition: A | Discharge status: Home / Self Care | Payer: BC Managed Care – PPO | Source: Ambulatory Visit | Attending: Internal Medicine | Admitting: Internal Medicine

## 2018-04-07 ENCOUNTER — Other Ambulatory Visit: Payer: Self-pay

## 2018-04-07 ENCOUNTER — Encounter
Admission: RE | Disposition: A | Discharge status: Home / Self Care | Payer: Self-pay | Source: Ambulatory Visit | Attending: Internal Medicine

## 2018-04-07 DIAGNOSIS — I200 Unstable angina: Secondary | ICD-10-CM | POA: Diagnosis not present

## 2018-04-07 DIAGNOSIS — E039 Hypothyroidism, unspecified: Secondary | ICD-10-CM | POA: Diagnosis not present

## 2018-04-07 DIAGNOSIS — G4733 Obstructive sleep apnea (adult) (pediatric): Secondary | ICD-10-CM | POA: Insufficient documentation

## 2018-04-07 DIAGNOSIS — E785 Hyperlipidemia, unspecified: Secondary | ICD-10-CM | POA: Insufficient documentation

## 2018-04-07 DIAGNOSIS — I10 Essential (primary) hypertension: Secondary | ICD-10-CM | POA: Diagnosis not present

## 2018-04-07 DIAGNOSIS — Z880 Allergy status to penicillin: Secondary | ICD-10-CM | POA: Insufficient documentation

## 2018-04-07 DIAGNOSIS — E119 Type 2 diabetes mellitus without complications: Secondary | ICD-10-CM | POA: Diagnosis not present

## 2018-04-07 DIAGNOSIS — Z6835 Body mass index (BMI) 35.0-35.9, adult: Secondary | ICD-10-CM | POA: Insufficient documentation

## 2018-04-07 DIAGNOSIS — F329 Major depressive disorder, single episode, unspecified: Secondary | ICD-10-CM | POA: Insufficient documentation

## 2018-04-07 DIAGNOSIS — G43909 Migraine, unspecified, not intractable, without status migrainosus: Secondary | ICD-10-CM | POA: Diagnosis not present

## 2018-04-07 DIAGNOSIS — Z9884 Bariatric surgery status: Secondary | ICD-10-CM | POA: Diagnosis not present

## 2018-04-07 DIAGNOSIS — F411 Generalized anxiety disorder: Secondary | ICD-10-CM | POA: Diagnosis not present

## 2018-04-07 HISTORY — PX: LEFT HEART CATH AND CORONARY ANGIOGRAPHY: CATH118249

## 2018-04-07 LAB — PREGNANCY, URINE: Preg Test, Ur: NEGATIVE

## 2018-04-07 SURGERY — LEFT HEART CATH AND CORONARY ANGIOGRAPHY
Anesthesia: Moderate Sedation | Laterality: Left

## 2018-04-07 MED ORDER — VERAPAMIL HCL 2.5 MG/ML IV SOLN
INTRAVENOUS | Status: AC
  Filled 2018-04-07: qty 2

## 2018-04-07 MED ORDER — ASPIRIN 81 MG PO CHEW
81.0000 mg | CHEWABLE_TABLET | ORAL | Status: AC
  Administered 2018-04-07: 81 mg via ORAL

## 2018-04-07 MED ORDER — SODIUM CHLORIDE 0.9% FLUSH: 3.0000 mL | Freq: Two times a day (BID) | INTRAVENOUS | Status: DC

## 2018-04-07 MED ORDER — MIDAZOLAM HCL 2 MG/2ML IJ SOLN
INTRAMUSCULAR | Status: AC
  Filled 2018-04-07: qty 2

## 2018-04-07 MED ORDER — FENTANYL CITRATE (PF) 100 MCG/2ML IJ SOLN
INTRAMUSCULAR | Status: AC
  Filled 2018-04-07: qty 2

## 2018-04-07 MED ORDER — FENTANYL CITRATE (PF) 100 MCG/2ML IJ SOLN
INTRAMUSCULAR | Status: DC | PRN
  Administered 2018-04-07 (×2): 25 ug via INTRAVENOUS
  Administered 2018-04-07: 50 ug via INTRAVENOUS

## 2018-04-07 MED ORDER — SODIUM CHLORIDE 0.9 % WEIGHT BASED INFUSION
3.0000 mL/kg/h | INTRAVENOUS | Status: AC
  Administered 2018-04-07: 3 mL/kg/h via INTRAVENOUS

## 2018-04-07 MED ORDER — SODIUM CHLORIDE 0.9 % WEIGHT BASED INFUSION: 1.0000 mL/kg/h | INTRAVENOUS | Status: DC

## 2018-04-07 MED ORDER — SODIUM CHLORIDE 0.9 % IV SOLN: 250.0000 mL | INTRAVENOUS | Status: DC | PRN

## 2018-04-07 MED ORDER — ASPIRIN 81 MG PO CHEW
CHEWABLE_TABLET | ORAL | Status: AC
  Administered 2018-04-07: 81 mg via ORAL
  Filled 2018-04-07: qty 1

## 2018-04-07 MED ORDER — MIDAZOLAM HCL 2 MG/2ML IJ SOLN
INTRAMUSCULAR | Status: DC | PRN
  Administered 2018-04-07 (×2): 1 mg via INTRAVENOUS

## 2018-04-07 MED ORDER — HEPARIN (PORCINE) IN NACL 1000-0.9 UT/500ML-% IV SOLN
INTRAVENOUS | Status: AC
  Filled 2018-04-07: qty 1000

## 2018-04-07 MED ORDER — HEPARIN SODIUM (PORCINE) 1000 UNIT/ML IJ SOLN
INTRAMUSCULAR | Status: AC
  Filled 2018-04-07: qty 1

## 2018-04-07 MED ORDER — SODIUM CHLORIDE 0.9% FLUSH: 3.0000 mL | INTRAVENOUS | Status: DC | PRN

## 2018-04-07 MED ORDER — IOPAMIDOL (ISOVUE-300) INJECTION 61%
INTRAVENOUS | Status: DC | PRN
  Administered 2018-04-07: 85 mL via INTRA_ARTERIAL

## 2018-04-07 SURGICAL SUPPLY — 12 items
CATH INFINITI 5FR ANG PIGTAIL (CATHETERS) ×2 IMPLANT
CATH INFINITI 5FR JL4 (CATHETERS) ×2 IMPLANT
CATH INFINITI JR4 5F (CATHETERS) ×2 IMPLANT
DEVICE CLOSURE MYNXGRIP 5F (Vascular Products) ×2 IMPLANT
GLIDESHEATH SLEND A-KIT 6F 22G (SHEATH) ×2 IMPLANT
KIT MANI 3VAL PERCEP (MISCELLANEOUS) ×2 IMPLANT
NEEDLE PERC 18GX7CM (NEEDLE) ×2 IMPLANT
NEEDLE PERC 21GX4CM (NEEDLE) ×2 IMPLANT
PACK CARDIAC CATH (CUSTOM PROCEDURE TRAY) ×2 IMPLANT
SHEATH AVANTI 5FR X 11CM (SHEATH) ×2 IMPLANT
WIRE GUIDERIGHT .035X150 (WIRE) ×2 IMPLANT
WIRE ROSEN-J .035X260CM (WIRE) ×2 IMPLANT

## 2018-05-11 ENCOUNTER — Ambulatory Visit: Payer: BC Managed Care – PPO | Admitting: Obstetrics & Gynecology

## 2018-05-12 ENCOUNTER — Ambulatory Visit: Payer: BC Managed Care – PPO | Admitting: Obstetrics & Gynecology

## 2018-05-25 ENCOUNTER — Ambulatory Visit (INDEPENDENT_AMBULATORY_CARE_PROVIDER_SITE_OTHER): Payer: BC Managed Care – PPO | Admitting: Obstetrics & Gynecology

## 2018-05-25 ENCOUNTER — Encounter: Payer: Self-pay | Admitting: Obstetrics & Gynecology

## 2018-05-25 VITALS — BP 120/80 | Ht 73.0 in | Wt 272.0 lb

## 2018-05-25 DIAGNOSIS — K6289 Other specified diseases of anus and rectum: Secondary | ICD-10-CM | POA: Insufficient documentation

## 2018-05-25 DIAGNOSIS — N914 Secondary oligomenorrhea: Secondary | ICD-10-CM | POA: Insufficient documentation

## 2018-05-25 DIAGNOSIS — E282 Polycystic ovarian syndrome: Secondary | ICD-10-CM | POA: Insufficient documentation

## 2018-05-25 DIAGNOSIS — N921 Excessive and frequent menstruation with irregular cycle: Secondary | ICD-10-CM | POA: Diagnosis not present

## 2018-05-25 DIAGNOSIS — R102 Pelvic and perineal pain: Secondary | ICD-10-CM | POA: Insufficient documentation

## 2018-05-25 DIAGNOSIS — Z30430 Encounter for insertion of intrauterine contraceptive device: Secondary | ICD-10-CM

## 2018-05-25 NOTE — Patient Instructions (Signed)
Intrauterine Device Insertion, Care After    This sheet gives you information about how to care for yourself after your procedure. Your health care provider may also give you more specific instructions. If you have problems or questions, contact your health care provider.  What can I expect after the procedure?  After the procedure, it is common to have:  · Cramps and pain in the abdomen.  · Light bleeding (spotting) or heavier bleeding that is like your menstrual period. This may last for up to a few days.  · Lower back pain.  · Dizziness.  · Headaches.  · Nausea.  Follow these instructions at home:  · Before resuming sexual activity, check to make sure that you can feel the IUD string(s). You should be able to feel the end of the string(s) below the opening of your cervix. If your IUD string is in place, you may resume sexual activity.  ? If you had a hormonal IUD inserted more than 7 days after your most recent period started, you will need to use a backup method of birth control for 7 days after IUD insertion. Ask your health care provider whether this applies to you.  · Continue to check that the IUD is still in place by feeling for the string(s) after every menstrual period, or once a month.  · Take over-the-counter and prescription medicines only as told by your health care provider.  · Do not drive or use heavy machinery while taking prescription pain medicine.  · Keep all follow-up visits as told by your health care provider. This is important.  Contact a health care provider if:  · You have bleeding that is heavier or lasts longer than a normal menstrual cycle.  · You have a fever.  · You have cramps or abdominal pain that get worse or do not get better with medicine.  · You develop abdominal pain that is new or is not in the same area of earlier cramping and pain.  · You feel lightheaded or weak.  · You have abnormal or bad-smelling discharge from your vagina.  · You have pain during sexual  activity.  · You have any of the following problems with your IUD string(s):  ? The string bothers or hurts you or your sexual partner.  ? You cannot feel the string.  ? The string has gotten longer.  · You can feel the IUD in your vagina.  · You think you may be pregnant, or you miss your menstrual period.  · You think you may have an STI (sexually transmitted infection).  Get help right away if:  · You have flu-like symptoms.  · You have a fever and chills.  · You can feel that your IUD has slipped out of place.  Summary  · After the procedure, it is common to have cramps and pain in the abdomen. It is also common to have light bleeding (spotting) or heavier bleeding that is like your menstrual period.  · Continue to check that the IUD is still in place by feeling for the string(s) after every menstrual period, or once a month.  · Keep all follow-up visits as told by your health care provider. This is important.  · Contact your health care provider if you have problems with your IUD string(s), such as the string getting longer or bothering you or your sexual partner.  This information is not intended to replace advice given to you by your health care provider. Make   sure you discuss any questions you have with your health care provider.  Document Released: 12/18/2010 Document Revised: 03/12/2016 Document Reviewed: 03/12/2016  [employer]vier Interactive Patient Education © 2019 [employer]

## 2018-05-25 NOTE — Progress Notes (Signed)
History of Present Illness:  Maria Fuller is a 33 y.o. who was stopped from taking OCP, Mirena (removed and all other forms of hormones approximately 2 months ago. Since that time, she states that her symptoms are worsening in that she now has freq and irregular cycles associated w mild crampy abdominal pains.  The pain is occas RLQ where cysts were present in past, but mild and without radiation or assoc sx's or modifiers.  Context of: Prior PCOS amd oligomenorrhea.    PMHx: She  has a past medical history of Bradycardia, Depression, Diabetes mellitus without complication (HCC), Headache, Hypertension, Meningitis (2015), PCOS (polycystic ovarian syndrome), Preterm labor, and Syncope. Also,  has a past surgical history that includes Cholecystectomy; Cesarean section; Tonsillectomy; Gastric bypass (04/2015); Cesarean section (N/A, 05/07/2016); Ureteroscopy with holmium laser lithotripsy (Right, 09/19/2016); Cystoscopy with stent placement (Right, 09/19/2016); and LEFT HEART CATH AND CORONARY ANGIOGRAPHY (Left, 04/07/2018)., family history includes Breast cancer in her paternal grandmother; Diabetes in her father, paternal grandfather, and paternal grandmother; Endometriosis in her sister; Polycystic ovary syndrome in her mother and sister.,  reports that she has never smoked. She has never used smokeless tobacco. She reports that she does not drink alcohol or use drugs. Current Meds  Medication Sig  . acetaminophen (TYLENOL) 325 MG tablet Take 650 mg by mouth every 6 (six) hours as needed (for pain.).  Marland Kitchen isosorbide mononitrate (IMDUR) 30 MG 24 hr tablet Take 30 mg by mouth daily.  . meclizine (ANTIVERT) 12.5 MG tablet Take 1 tablet (12.5 mg total) by mouth 3 (three) times daily as needed for dizziness.  . sertraline (ZOLOFT) 100 MG tablet Take 200 mg by mouth at bedtime.   Also, is allergic to ceftin [cefuroxime axetil] and penicillins..  Review of Systems  Constitutional: Negative for chills, fever and  malaise/fatigue.  HENT: Negative for congestion, sinus pain and sore throat.   Eyes: Negative for blurred vision and pain.  Respiratory: Negative for cough and wheezing.   Cardiovascular: Negative for chest pain and leg swelling.  Gastrointestinal: Negative for abdominal pain, constipation, diarrhea, heartburn, nausea and vomiting.  Genitourinary: Negative for dysuria, frequency, hematuria and urgency.  Musculoskeletal: Negative for back pain, joint pain, myalgias and neck pain.  Skin: Negative for itching and rash.  Neurological: Negative for dizziness, tremors and weakness.  Endo/Heme/Allergies: Does not bruise/bleed easily.  Psychiatric/Behavioral: Negative for depression. The patient is not nervous/anxious and does not have insomnia.    Physical Exam:  BP 120/80   Ht 6\' 1"  (1.854 m)   Wt 272 lb (123.4 kg)   LMP 05/20/2018   BMI 35.89 kg/m  Body mass index is 35.89 kg/m. Constitutional: Well nourished, well developed female in no acute distress.  Abdomen: diffusely non tender to palpation, non distended, and no masses, hernias Neuro: Grossly intact Psych:  Normal mood and affect.    Assessment:  Problem List Items Addressed This Visit      Endocrine   PCOS (polycystic ovarian syndrome) - Primary     Other   Pelvic pain   Menometrorrhagia    Plan- to have Mirena replaced for hormone managament of periods. Alternatives d/w pt [place] nv for assessment of pain and cysts and to follow up on IUD orientation (past history of malpositioning of a prior IUD)  A total of 15 minutes were spent face-to-face with the patient during this encounter and over half of that time dealt with counseling and coordination of care.   IUD PROCEDURE NOTE:  Maria Fuller  Mckellar is a 33 y.o. [id number] here for IUD insertion. No GYN concerns.  Last pap smear was normal.  IUD Insertion Procedure Note Patient identified, informed consent performed, consent signed.   Discussed risks of irregular bleeding,  cramping, infection, malpositioning or misplacement of the IUD outside the uterus which may require further procedure such as laparoscopy, risk of failure <1%. Time out was performed.  Urine pregnancy test negative.  A bimanual exam showed the uterus to be midposition.  Speculum placed in the vagina.  Cervix visualized.  Cleaned with Betadine x 2.  Grasped anteriorly with a single tooth tenaculum.  Uterus sounded to 7 cm.   IUD placed per manufacturer's recommendations.  Strings trimmed to 3 cm. Tenaculum was removed, good hemostasis noted.  Patient tolerated procedure well.   Patient was given post-procedure instructions.  She was advised to have backup contraception for one week.  Patient was also asked to check IUD strings periodically and follow up in 4 weeks for IUD check.  Annamarie MajorPaul Maria Escutia, MD, Merlinda FrederickFACOG [facility], [facility] 05/25/2018  4:03 PM

## 2018-05-26 IMAGING — CT CT RENAL STONE PROTOCOL
2 of 4 series · 16 of 46 positions shown, 18 images · non-contrast
Comparison: 09/08/2016

CLINICAL DATA: Flank pain

EXAM:
CT ABDOMEN AND PELVIS WITHOUT CONTRAST
TECHNIQUE: Multidetector CT imaging of the abdomen and pelvis was performed
following the standard protocol without IV contrast.

[Series 2: stone full standard · axial · 0.89mm/px · z∈[-1029,-564]mm · 13 of 101 slices shown, 15 images]
[im 4/101  soft-tissue]
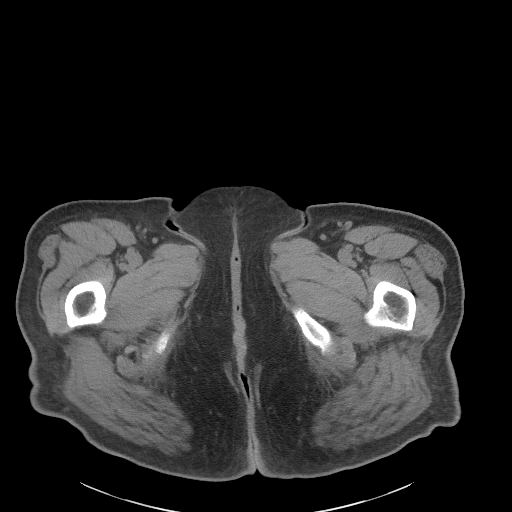
[im 4/101  bone]
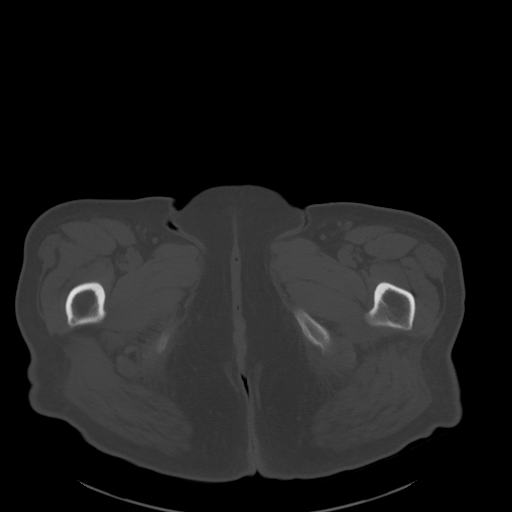
[im 12/101  soft-tissue]
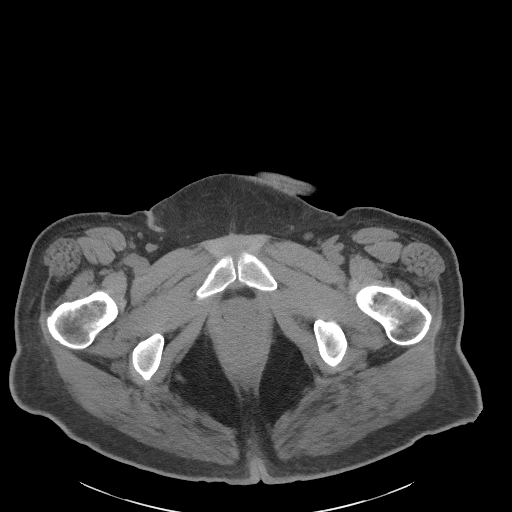
[im 20/101  soft-tissue]
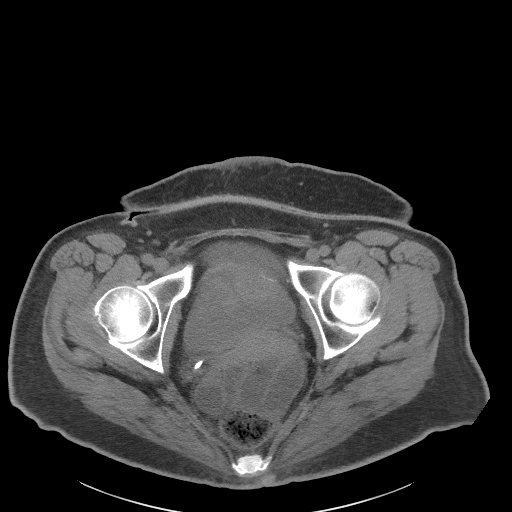
[im 27/101  soft-tissue]
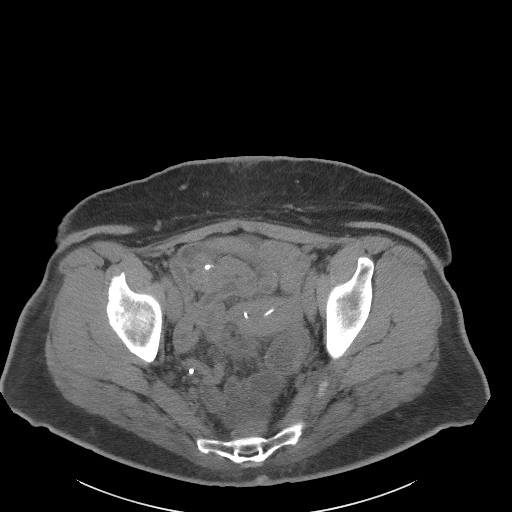
[im 35/101  soft-tissue]
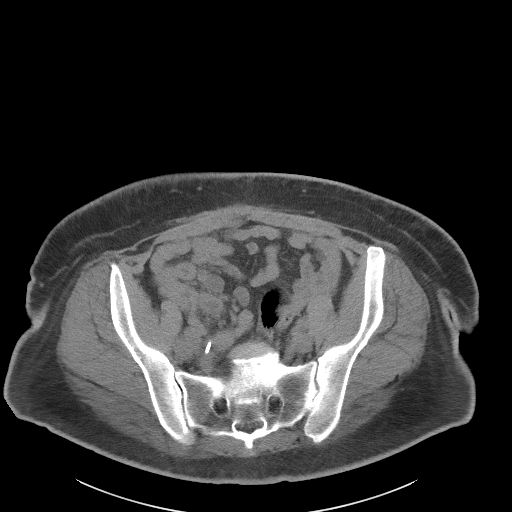
[im 43/101  soft-tissue]
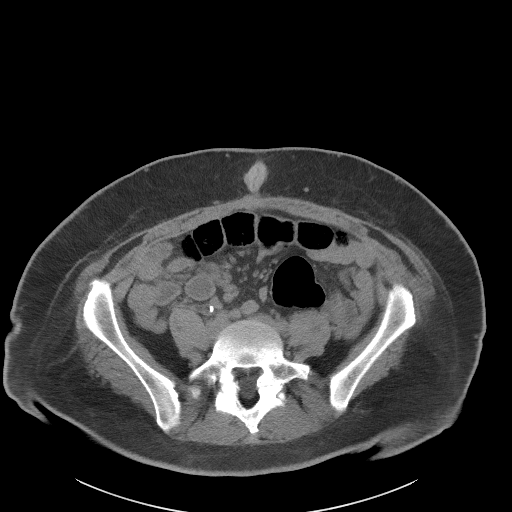
[im 51/101  soft-tissue]
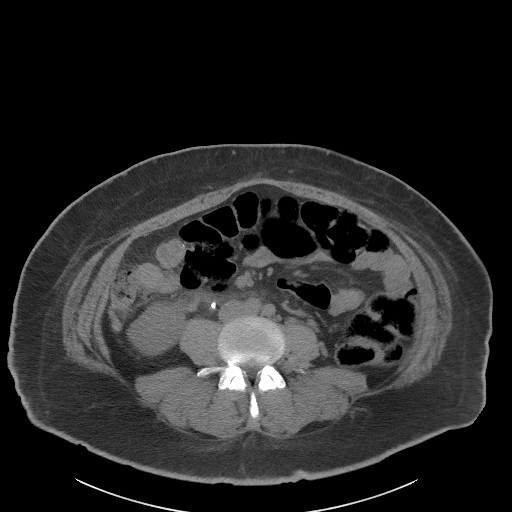
[im 58/101  soft-tissue]
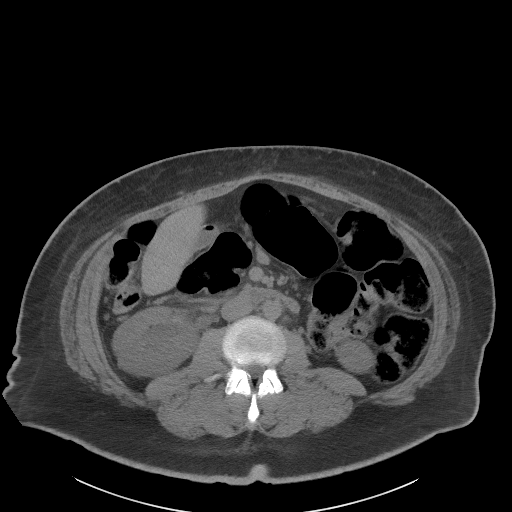
[im 66/101  soft-tissue]
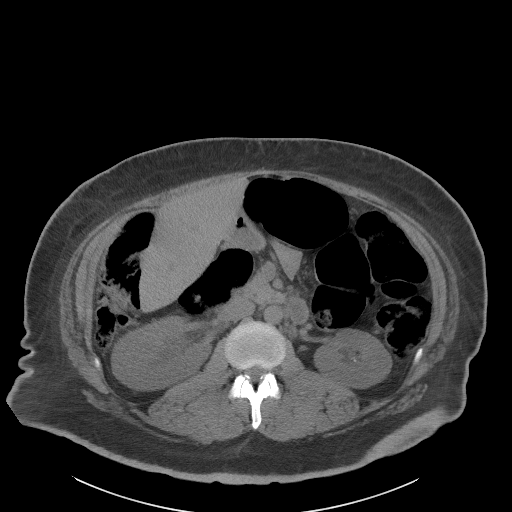
[im 66/101  bone]
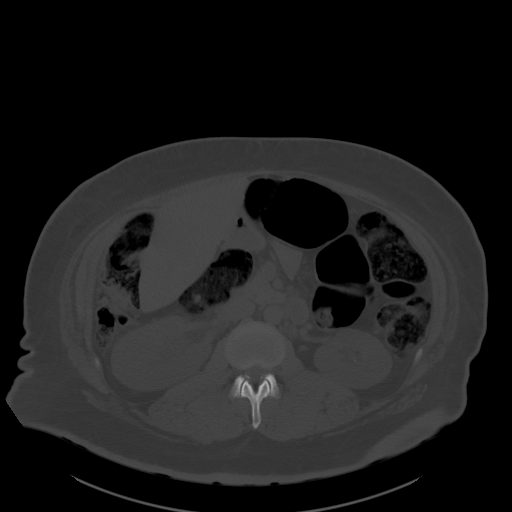
[im 74/101  soft-tissue]
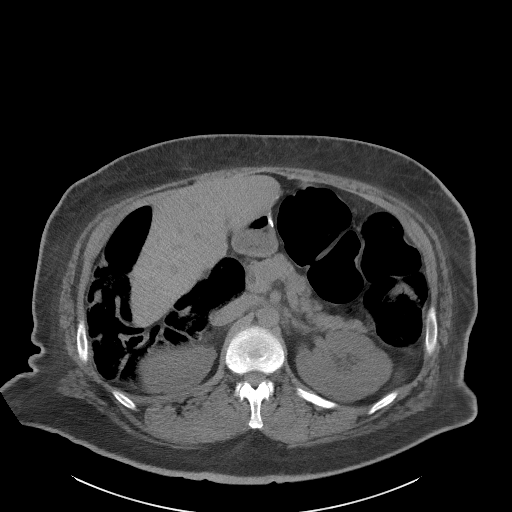
[im 81/101  soft-tissue]
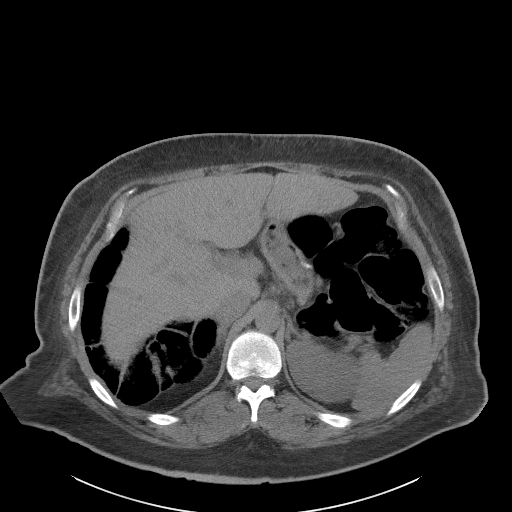
[im 89/101  soft-tissue]
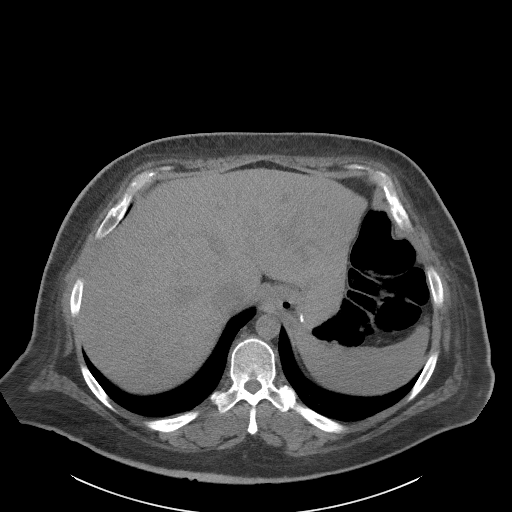
[im 97/101  soft-tissue]
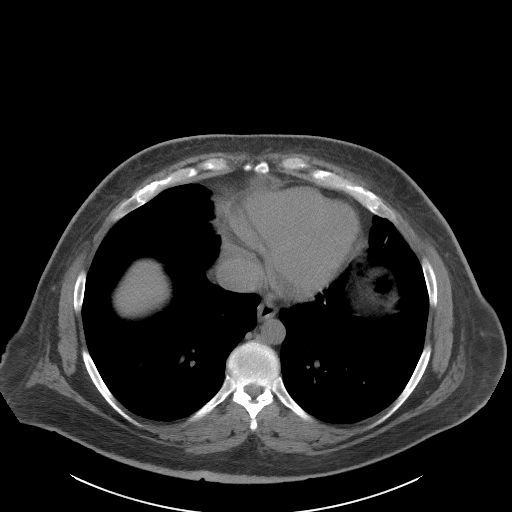

[Series 5: coronal · coronal · 0.84mm/px · 3 of 154 slices shown]
[im 52/154  soft-tissue]
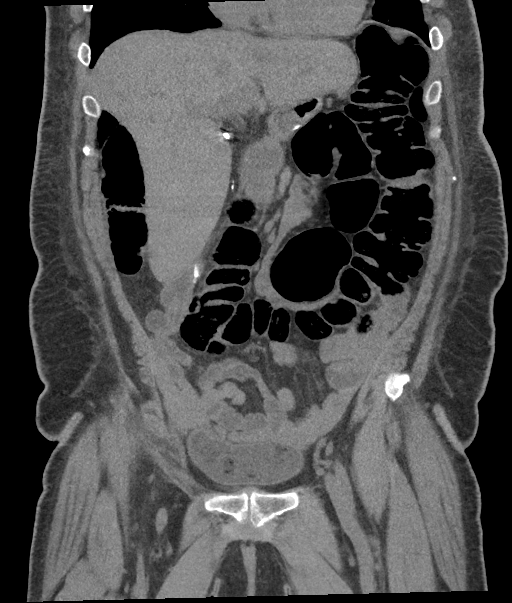
[im 69/154  soft-tissue]
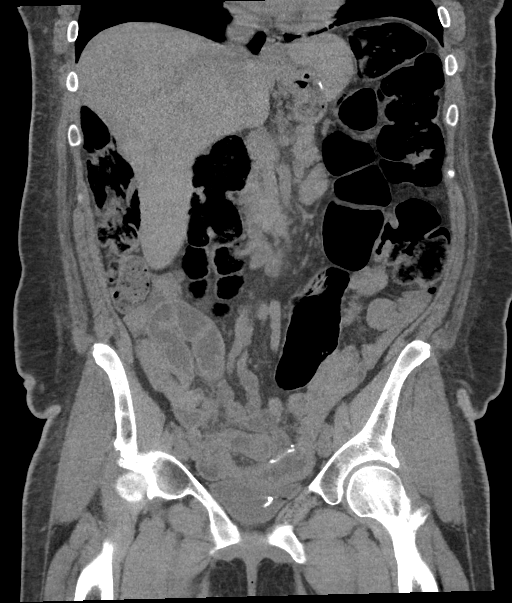
[im 86/154  soft-tissue]
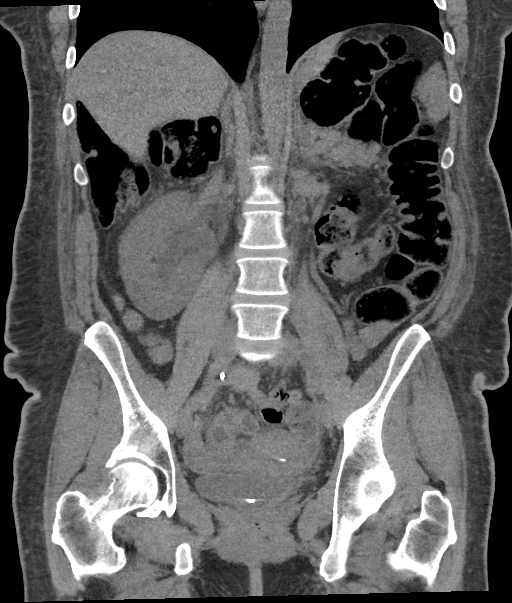

[16 of 46 positions shown; findings below may reference images not displayed]

FINDINGS: Lower chest: Dependent atelectasis

Hepatobiliary: Postcholecystectomy.  Liver is within normal limits.

Pancreas: Unremarkable

Spleen: Unremarkable

Adrenals/Urinary Tract: Small calculi in the collecting system of
the left kidney are not significantly changed. There is no left
hydronephrosis.

Mild right hydronephrosis has developed since the prior study. A
right ureteral stent has been placed. The distal wound is coiled in
the bladder. The proximal and is position at the right ureteropelvic
junction. Small calculi are present in the collecting system of the
right kidney.

Stomach/Bowel: Prominent stool burden and distension of the colon
are noted. No free intraperitoneal gas. No obvious pneumatosis.
Postoperative changes in the stomach and bowel are noted. These are
suspected to be secondary to gastric bypass surgery.

Vascular/Lymphatic: No evidence of aortic aneurysm. Small
para-aortic lymph nodes.

Reproductive: IUD device is in place but somewhat malpositioned. It
is in the fundus but oriented horizontally. Adnexa are within normal
limits.

Other: No free-fluid. There is diffuse subcutaneous fat and intra-
abdominal fat edema.

Musculoskeletal: Bilateral L5 pars defects with grade 1
spondylolisthesis.
IMPRESSION: Right ureteral stent has been placed. Right hydronephrosis has
developed.

Bilateral nephrolithiasis.

Prominent stool burden and cash is distension of the colon.

## 2018-06-23 ENCOUNTER — Ambulatory Visit: Payer: BC Managed Care – PPO

## 2018-06-23 ENCOUNTER — Ambulatory Visit: Payer: BC Managed Care – PPO | Admitting: Obstetrics & Gynecology

## 2018-07-06 ENCOUNTER — Ambulatory Visit: Payer: BC Managed Care – PPO

## 2018-07-06 ENCOUNTER — Ambulatory Visit: Payer: BC Managed Care – PPO | Admitting: Obstetrics & Gynecology

## 2018-08-30 ENCOUNTER — Encounter: Payer: Self-pay | Admitting: Obstetrics & Gynecology

## 2018-08-31 ENCOUNTER — Other Ambulatory Visit: Payer: Self-pay | Admitting: Obstetrics & Gynecology

## 2018-08-31 DIAGNOSIS — R102 Pelvic and perineal pain: Secondary | ICD-10-CM

## 2018-08-31 NOTE — Telephone Encounter (Signed)
Patient is schedule 09/09/18 and telephone visit Per [facility]

## 2018-09-09 ENCOUNTER — Encounter: Payer: Self-pay | Admitting: Obstetrics & Gynecology

## 2018-09-09 ENCOUNTER — Ambulatory Visit (INDEPENDENT_AMBULATORY_CARE_PROVIDER_SITE_OTHER): Payer: BC Managed Care – PPO

## 2018-09-09 ENCOUNTER — Telehealth: Payer: Self-pay | Admitting: Obstetrics & Gynecology

## 2018-09-09 ENCOUNTER — Other Ambulatory Visit: Payer: Self-pay

## 2018-09-09 ENCOUNTER — Other Ambulatory Visit: Payer: Self-pay | Admitting: Obstetrics & Gynecology

## 2018-09-09 ENCOUNTER — Ambulatory Visit (INDEPENDENT_AMBULATORY_CARE_PROVIDER_SITE_OTHER): Payer: BC Managed Care – PPO | Admitting: Obstetrics & Gynecology

## 2018-09-09 VITALS — Ht 73.0 in | Wt 264.0 lb

## 2018-09-09 DIAGNOSIS — N83201 Unspecified ovarian cyst, right side: Secondary | ICD-10-CM

## 2018-09-09 DIAGNOSIS — R102 Pelvic and perineal pain unspecified side: Secondary | ICD-10-CM

## 2018-09-09 DIAGNOSIS — E282 Polycystic ovarian syndrome: Secondary | ICD-10-CM

## 2018-09-09 MED ORDER — OXYCODONE-ACETAMINOPHEN 5-325 MG PO TABS: 1.0000 | ORAL_TABLET | Freq: Four times a day (QID) | ORAL | 0 refills | Status: DC | PRN

## 2018-09-09 NOTE — Telephone Encounter (Signed)
Patient is aware of H&P at [facility] on 10/07/18 @ 8:40am w/ Dr. Tiburcio Pea, Pre-admit Testing to be scheduled, and OR on 10/14/18. Patient is aware she may receive calls from the [facility] and [facility]. Patient confirmed BCBS and no secondary insurance.

## 2018-09-09 NOTE — Patient Instructions (Signed)
Ovarian Cystectomy    Ovarian cystectomy is a procedure that is done to remove a fluid-filled sac (cyst) on an ovary. The ovaries are small organs that produce eggs in women. Various types of cysts can form on the ovaries. Most are not cancerous. This procedure may be done for cysts that are large, cause symptoms, or do not go away on their own. It may also be done for a cyst that is cancerous or might be cancerous.  This surgery can be done using a laparoscopic technique or an open abdominal technique. The laparoscopic technique involves smaller incisions and results in a faster recovery time. The technique used will depend on your age, the type of cyst that you have, and whether the cyst is cancerous. The laparoscopic technique is not used for a cancerous cyst.  Tell a health care provider about:  · Any allergies you have.  · All medicines you are taking, including vitamins, herbs, eye drops, creams, and over-the-counter medicines.  · Any problems you or family members have had with the use of anesthetic medicines.  · Any blood disorders you have.  · Any surgeries you have had.  · Any medical conditions you have.  · Whether you are pregnant or may be pregnant.  What are the risks?  Generally, this is a safe procedure. However, problems may occur, including:  · Excessive bleeding.  · Infection.  · Damage to other structures or organs.  · Blood clots.  · Inability get pregnant (infertility).  What happens before the procedure?  Staying hydrated  Follow instructions from your health care provider about hydration, which may include:  · Up to 2 hours before the procedure - you may continue to drink clear liquids, such as water, clear fruit juice, black coffee, and plain tea.  Eating and drinking restrictions  Follow instructions from your health care provider about eating and drinking, which may include:  · 8 hours before the procedure - stop eating and drinking everything except clear liquids.  · 6 hours before the  procedure - stop eating light meals or foods, such as toast or cereal.  · 6 hours before the procedure - stop drinking milk or drinks that contain milk.  · 2 hours before the procedure - stop drinking clear liquids.  Medicines  · Ask your health care provider about:  ? Changing or stopping your regular medicines. This is especially important if you are taking diabetes medicines or blood thinners.  ? Taking medicines such as aspirin and ibuprofen. These medicines can thin your blood. Do not take these medicines before your procedure if your health care provider instructs you not to.  · You may be given antibiotic medicine to help prevent infection.  General instructions  · Do not use any products that contain nicotine or tobacco, such as cigarettes and e-cigarettes, for 2 weeks before the procedure. If you need help quitting, ask your health care provider.  · Ask your health care provider how your surgical site will be marked or identified.  · Plan to have someone take you home from the hospital or clinic.  · Plan to have someone help with household activities for 1-2 weeks after the procedure.  · You may be asked to shower with a germ-killing soap.  · Let your health care provider know if you develop a cold or any infection before your surgery.  What happens during the procedure?  · To reduce your risk of infection:  ? Your health care team will   wash or sanitize their hands.  ? Your skin will be washed with soap.  ? Hair may be removed from the surgical area.  · An IV will be inserted into one of your veins.  · You will be given one or more of the following:  ? A medicine to help you relax (sedative).  ? A medicine to make you fall asleep (general anesthetic).  · Small monitors will be attached to your body. They will be used to check your heart, blood pressure, and oxygen level.  · A breathing tube will be placed into your lungs during the procedure.  · Your surgeon will use one of the following methods to perform  the surgery:  Laparoscopic technique  · Several small incisions will be made in your abdomen. These will typically be about 1½ to 2 cm long.  · Your abdomen will be filled with carbon dioxide gas to make it expand. This will give the surgeon more room to operate. It will also make your organs easier to see.  · A thin, lighted tube with a camera (laparoscope) will be put through one of the small incisions. The laparoscope will send a picture to a TV screen in the operating room. This will give the surgeon a good view of your organs.  · Hollow tubes will be put through the other small incisions in your abdomen. The tools needed for the procedure will be put through these tubes.  · The ovary with the cyst will be identified, and the cyst will be removed. It will then be sent to the lab for testing. If the cyst has cancer cells, both ovaries may need to be removed during a different surgery.  · Tools will be removed. The incisions will then be closed with stitches or skin glue. Bandages (dressings) may be applied.  Open abdominal technique  · A single, large incision will be made along your bikini line or in the middle of your lower abdomen.  · The ovary with the cyst will be identified, and the cyst will be removed. It will then be sent to the lab for testing. If the cyst has cancer cells, both ovaries may need to be removed during a different surgery.  · The incision will then be closed with stitches or staples.  · Bandages (dressings) may be applied.  The procedure may vary among health care providers and hospitals.  What happens after the procedure?  · Your blood pressure, heart rate, breathing rate, and blood oxygen level will be monitored until the medicines you were given have worn off.  · Your IV access will be removed after you are able to eat and drink well.  · You may be given medicine for pain or to help you sleep.  · You may be given an antibiotic medicine.  · Do not drive for 24 hours if you were given a  sedative.  Summary  · Ovarian cystectomy is a procedure that is done to remove a cyst on an ovary.  · This procedure may be done for cysts that are large, cause symptoms, or do not go away on their own. It may also be done for a cyst that is cancerous or might be cancerous.  · Follow instructions from your health care provider about eating and drinking before the procedure.  · After the cyst is removed, it will be sent to the lab for testing.  This information is not intended to replace advice given to you by your   health care provider. Make sure you discuss any questions you have with your health care provider.  Document Released: 02/16/2007 Document Revised: 06/10/2016 Document Reviewed: 06/10/2016  Elsevier Interactive Patient Education © 2019 [employer]

## 2018-09-09 NOTE — Telephone Encounter (Signed)
Can you send a note through my chart? I don't know how .

## 2018-09-09 NOTE — Telephone Encounter (Signed)
-----   Message from Nadara Mustard, MD sent at 09/09/2018 10:34 AM EDT ----- Regarding: Surgery June 2020 Surgery Booking Request Patient Full Name:  Maria Fuller  MRN: [id number]  DOB: 1985-09-16  Surgeon: Letitia Libra, MD  Requested Surgery Date and Time: June 2 or 11 Primary Diagnosis AND Code: Pelvic pain, Right ovarian cyst Secondary Diagnosis and Code: RLQ PAIN Surgical Procedure: Laparoscopy, Right ovarian cystectomy possible oophorectomy L&D Notification: No Admission Status: same day surgery Length of Surgery: 1 hr Special Case Needs: No H&P: yes (date) Phone Interview???: yes Interpreter: Language:  Medical Clearance: no Special Scheduling Instructions: no

## 2018-09-09 NOTE — Progress Notes (Signed)
Virtual Visit via Telephone Note  I connected with Maria Fuller on 09/09/18 at  9:40 AM EDT by telephone and verified that I am speaking with the correct person using two identifiers.   I discussed the limitations, risks, security and privacy concerns of performing an evaluation and management service by telephone and the availability of in person appointments. I also discussed with the patient that there may be a patient responsible charge related to this service. The patient expressed understanding and agreed to proceed.   HPI: Abdominal Pain Patient presents for evaluation of abdominal pain. The pain is described as sharp and stabbing, and is 8/10 in intensity. Pain is located in the RLQ and deep pelvis area without radiation. Onset was ongoing occurring a few months ago. Symptoms have been gradually worsening since. Aggravating factors: activity. Alleviating factors: minimal response to IBF, Tylenol, even percocet. Associated symptoms: affects mood and depression; some nausea; insomnia. The patient denies constipation, diarrhea and fever. Risk factors for pelvic/abdominal pain include PCOS; possible endometriosis.   Pt also has Mirena IUD since Jan 2020.Marland Kitchen.  Ultrasound demonstrates : see below (right ovarian 2.6cm complex cyst; IUD)  PMHx: She  has a past medical history of Bradycardia, Depression, Diabetes mellitus without complication (HCC), Headache, Hypertension, Meningitis (2015), PCOS (polycystic ovarian syndrome), Preterm labor, and Syncope. Also,  has a past surgical history that includes Cholecystectomy; Cesarean section; Tonsillectomy; Gastric bypass (04/2015); Cesarean section (N/A, 05/07/2016); Ureteroscopy with holmium laser lithotripsy (Right, 09/19/2016); Cystoscopy with stent placement (Right, 09/19/2016); and LEFT HEART CATH AND CORONARY ANGIOGRAPHY (Left, 04/07/2018)., family history includes Breast cancer in her paternal grandmother; Diabetes in her father, paternal grandfather,  and paternal grandmother; Endometriosis in her sister; Polycystic ovary syndrome in her mother and sister.,  reports that she has never smoked. She has never used smokeless tobacco. She reports that she does not drink alcohol or use drugs.  She has a current medication list which includes the following prescription(s): acetaminophen, isosorbide mononitrate, levonorgestrel, meclizine, sertraline, trazodone, and oxycodone-acetaminophen. Also, is allergic to ceftin [cefuroxime axetil] and penicillins.  Review of Systems  Constitutional: Negative for chills, fever and malaise/fatigue.  HENT: Negative for congestion, sinus pain and sore throat.   Eyes: Negative for blurred vision and pain.  Respiratory: Negative for cough and wheezing.   Cardiovascular: Negative for chest pain and leg swelling.  Gastrointestinal: Negative for abdominal pain, constipation, diarrhea, heartburn, nausea and vomiting.  Genitourinary: Negative for dysuria, frequency, hematuria and urgency.  Musculoskeletal: Negative for back pain, joint pain, myalgias and neck pain.  Skin: Negative for itching and rash.  Neurological: Negative for dizziness, tremors and weakness.  Endo/Heme/Allergies: Does not bruise/bleed easily.  Psychiatric/Behavioral: Negative for depression. The patient is not nervous/anxious and does not have insomnia.    Objective: Ht 6\' 1"  (1.854 m)   Wt 264 lb (119.7 kg)   BMI 34.83 kg/m  No exam today, due to telephone [facility] visits and procedures.  Prior visits reviewed along with ultrasounds/labs as indicated.  [place] Pelvis Transvanginal Non-ob (tv Only)  Result Date: 09/09/2018 Patient Name: Maria Fuller DOB: 12/06/1985 MRN: [id number] ULTRASOUND REPORT Location: [facility] Date of Service: 09/09/2018 Indications:Pelvic Pain Findings: The uterus is anteverted and measures 8.9 x 5.6 x 4.9cm. Echo texture is homogenous without evidence of focal masses. The  Endometrium measures 7.7 mm. IUD in place and slightly crooked. Body of IUD pointing to the left. Right Ovary measures 5.2 x 2.8 x 3.6 cm with complex  cyst measuring 2.6 x 2.0 x 2.0cm. Left Ovary measures 4.3 x 2.3 x 1.7 cm. It is normal in appearance. Survey of the adnexa demonstrates no adnexal masses. There is no free fluid in the cul de sac. Impression: 1. IUD slightly crooked in position. Body of IUD pointing towards the left. 2. Complex right ovarian cyst 2.6cm. Recommendations: 1.Clinical correlation with the patient's History and Physical Exam. Darlina Guys, RT Review of ULTRASOUND.    I have personally reviewed images and report of recent ultrasound done at [facility].    Plan of management to be discussed with patient. Annamarie Major, MD, FACOG [facility], [facility] 09/09/2018  10:35 AM    Assessment: New concerns w pain, Cyst Pelvic pain - Heat, rest, OTC meds discussed  - Plan: oxyCODONE-acetaminophen (PERCOCET/ROXICET) 5-325 MG tablet for occasional use on the bad days - PCOS and endometriosis as contributors to pain in addition to cyst discussed  Right ovarian cyst - Cyst management d/w pt, from a diagnostic as well as therapeutic perspective.  As has been present w sx's for months, surgery option discussed for June to assess, remove, and also assess for endometriosis or other etiology for pain. - Risk of torsion, cyst growth, and need for surgery sooner discussed - Has IUD to help keep hormone levels even, and to have hopefully prevented recurrent cyst formation; not successful at this juncture  PCOS (polycystic ovarian syndrome)  IUD localized, does not need adjusted at this time  I discussed the assessment and treatment plan with the patient. The patient was provided an opportunity to ask questions and all were answered. The patient agreed with the plan and demonstrated an understanding of the instructions.   The patient was advised to call back or seek an  in-person evaluation if the symptoms worsen or if the condition fails to improve as anticipated.  I provided 25 minutes of non-face-to-face time during this encounter.  Annamarie Major, MD, [facility], [facility] 09/09/2018  10:39 AM

## 2018-09-22 ENCOUNTER — Encounter: Payer: Self-pay | Admitting: Obstetrics & Gynecology

## 2018-09-23 ENCOUNTER — Encounter: Payer: Self-pay | Admitting: Obstetrics & Gynecology

## 2018-09-23 ENCOUNTER — Encounter
Admission: RE | Admit: 2018-09-23 | Discharge: 2018-09-23 | Disposition: A | Discharge status: Home / Self Care | Payer: BC Managed Care – PPO | Source: Ambulatory Visit | Attending: Obstetrics & Gynecology | Admitting: Obstetrics & Gynecology

## 2018-09-23 ENCOUNTER — Other Ambulatory Visit: Payer: Self-pay

## 2018-09-23 ENCOUNTER — Other Ambulatory Visit: Payer: Self-pay | Admitting: Obstetrics & Gynecology

## 2018-09-23 ENCOUNTER — Ambulatory Visit (INDEPENDENT_AMBULATORY_CARE_PROVIDER_SITE_OTHER): Payer: BC Managed Care – PPO | Admitting: Obstetrics & Gynecology

## 2018-09-23 VITALS — BP 130/80 | Ht 73.0 in | Wt 278.0 lb

## 2018-09-23 DIAGNOSIS — Z1159 Encounter for screening for other viral diseases: Secondary | ICD-10-CM | POA: Insufficient documentation

## 2018-09-23 DIAGNOSIS — N83201 Unspecified ovarian cyst, right side: Secondary | ICD-10-CM | POA: Diagnosis not present

## 2018-09-23 DIAGNOSIS — Z01812 Encounter for preprocedural laboratory examination: Secondary | ICD-10-CM | POA: Insufficient documentation

## 2018-09-23 DIAGNOSIS — R1031 Right lower quadrant pain: Secondary | ICD-10-CM | POA: Diagnosis not present

## 2018-09-23 HISTORY — DX: Personal history of urinary calculi: Z87.442

## 2018-09-23 HISTORY — DX: Sleep apnea, unspecified: G47.30

## 2018-09-23 LAB — TYPE AND SCREEN
ABO/RH(D): O POS
Antibody Screen: NEGATIVE

## 2018-09-23 LAB — BASIC METABOLIC PANEL
Anion gap: 6 (ref 5–15)
BUN: 8 mg/dL (ref 6–20)
CO2: 25 mmol/L (ref 22–32)
Calcium: 8.2 mg/dL — ABNORMAL LOW (ref 8.9–10.3)
Chloride: 110 mmol/L (ref 98–111)
Creatinine, Ser: 0.48 mg/dL (ref 0.44–1.00)
GFR calc Af Amer: >60 mL/min (ref >60)
GFR calc non Af Amer: >60 mL/min (ref >60)
Glucose, Bld: 98 mg/dL (ref 70–99)
Potassium: 3.6 mmol/L (ref 3.5–5.1)
Sodium: 141 mmol/L (ref 135–145)

## 2018-09-23 LAB — CBC
HCT: 34.5 % — ABNORMAL LOW (ref 36.0–46.0)
Hemoglobin: 11.3 g/dL — ABNORMAL LOW (ref 12.0–15.0)
MCH: 30.3 pg (ref 26.0–34.0)
MCHC: 32.8 g/dL (ref 30.0–36.0)
MCV: 92.5 fL (ref 80.0–100.0)
Platelets: 330 10*3/uL (ref 150–400)
RBC: 3.73 MIL/uL — ABNORMAL LOW (ref 3.87–5.11)
RDW: 12 % (ref 11.5–15.5)
WBC: 7.5 10*3/uL (ref 4.0–10.5)
nRBC: 0 % (ref 0.0–0.2)

## 2018-09-23 NOTE — Progress Notes (Signed)
PRE-OPERATIVE HISTORY AND PHYSICAL EXAM  HPI:  Maria Fuller is a 33 y.o. [id number] No LMP recorded. (Menstrual status: IUD).; she is being admitted for surgery related to adnexal mass and pelvic pain.  The pain is described as sharp and stabbing, and is 8/10 in intensity. Pain is located in the RLQ and deep pelvis area without radiation. Onset was ongoing occurring a few months ago. Symptoms have been gradually worsening since. Aggravating factors: activity. Alleviating factors: minimal response to IBF, Tylenol, even percocet. Associated symptoms: affects mood and depression; some nausea; insomnia. The patient denies constipation, diarrhea and fever. Risk factors for pelvic/abdominal pain include PCOS; possible endometriosis.   Pt also has Mirena IUD since Jan 2020.Marland Kitchen  Ultrasound demonstrates :right ovarian 2.6cm complex cyst; IUD  PMHx: Past Medical History:  Diagnosis Date   Bradycardia    Depression    Diabetes mellitus without complication (HCC)    resolved with weight loss from gastric bypass   Headache    migraines. resolved since gastric bypass   History of kidney stones    h/o   Hypertension    resolved since gastric bypass, severe pre-eclampsia with G1   Meningitis 2015   PCOS (polycystic ovarian syndrome)    Preterm labor    Sleep apnea    had gastric bypass surgery and now resolved   Syncope    Past Surgical History:  Procedure Laterality Date   CESAREAN SECTION     CESAREAN SECTION N/A 05/07/2016   Procedure: CESAREAN SECTION;  Surgeon: Nadara Mustard, MD;  Location: [facility];  Service: Obstetrics;  Laterality: N/A;  Time of birth: [birth year] Sex: Female Weight: 3940 kg, 8 lb 11 oz   CHOLECYSTECTOMY     CYSTOSCOPY WITH STENT PLACEMENT Right 09/19/2016   Procedure: CYSTOSCOPY WITH STENT PLACEMENT;  Surgeon: Hildred Laser, MD;  Location: [facility];  Service: Urology;  Laterality: Right;   GASTRIC BYPASS  04/2015   LEFT HEART CATH AND CORONARY  ANGIOGRAPHY Left 04/07/2018   Procedure: LEFT HEART CATH AND CORONARY ANGIOGRAPHY;  Surgeon: Alwyn Pea, MD;  Location: [facility];  Service: Cardiovascular;  Laterality: Left;   TONSILLECTOMY     URETEROSCOPY WITH HOLMIUM LASER LITHOTRIPSY Right 09/19/2016   Procedure: URETEROSCOPY WITH HOLMIUM LASER LITHOTRIPSY;  Surgeon: Hildred Laser, MD;  Location: [facility];  Service: Urology;  Laterality: Right;   Family History  Problem Relation Age of Onset   Polycystic ovary syndrome Mother    Diabetes Father    Endometriosis Sister    Polycystic ovary syndrome Sister    Breast cancer Paternal Grandmother    Diabetes Paternal Grandmother    Diabetes Paternal Grandfather    Kidney cancer Neg Hx    Prostate cancer Neg Hx    Bladder Cancer Neg Hx    Social History   Tobacco Use   Smoking status: Never Smoker   Smokeless tobacco: Never Used  Substance Use Topics   Alcohol use: No   Drug use: No    Current Outpatient Medications:    acetaminophen (TYLENOL) 325 MG tablet, Take 650 mg by mouth every 6 (six) hours as needed (for pain.)., Disp: , Rfl:    isosorbide mononitrate (IMDUR) 30 MG 24 hr tablet, Take 30 mg by mouth daily., Disp: , Rfl:    levonorgestrel (MIRENA) 20 MCG/24HR IUD, 1 each by Intrauterine route once., Disp: , Rfl:    meclizine (ANTIVERT) 12.5 MG tablet, Take 1 tablet (12.5 mg total) by  mouth 3 (three) times daily as needed for dizziness., Disp: 14 tablet, Rfl: 0   oxyCODONE-acetaminophen (PERCOCET/ROXICET) 5-325 MG tablet, Take 1-2 tablets by mouth every 6 (six) hours as needed., Disp: 20 tablet, Rfl: 0   sertraline (ZOLOFT) 100 MG tablet, Take 200 mg by mouth at bedtime. , Disp: , Rfl:    traZODone (DESYREL) 50 MG tablet, Take 50 mg by mouth at bedtime. , Disp: , Rfl:  Allergies: Ceftin [cefuroxime axetil] and Penicillins  Review of Systems  Constitutional: Negative for chills, fever and malaise/fatigue.  HENT: Negative for  congestion, sinus pain and sore throat.   Eyes: Negative for blurred vision and pain.  Respiratory: Negative for cough and wheezing.   Cardiovascular: Negative for chest pain and leg swelling.  Gastrointestinal: Negative for abdominal pain, constipation, diarrhea, heartburn, nausea and vomiting.  Genitourinary: Negative for dysuria, frequency, hematuria and urgency.  Musculoskeletal: Negative for back pain, joint pain, myalgias and neck pain.  Skin: Negative for itching and rash.  Neurological: Negative for dizziness, tremors and weakness.  Endo/Heme/Allergies: Does not bruise/bleed easily.  Psychiatric/Behavioral: Negative for depression. The patient is not nervous/anxious and does not have insomnia.     Objective: BP 130/80    Ht 6\' 1"  (1.854 m)    Wt 278 lb (126.1 kg)    BMI 36.68 kg/m   Filed Weights   09/23/18 1500  Weight: 278 lb (126.1 kg)   Physical Exam Constitutional:      General: She is not in acute distress.    Appearance: She is well-developed.  HENT:     Head: Normocephalic and atraumatic. No laceration.     Right Ear: Hearing normal.     Left Ear: Hearing normal.     Mouth/Throat:     Pharynx: Uvula midline.  Eyes:     Pupils: Pupils are equal, round, and reactive to light.  Neck:     Musculoskeletal: Normal range of motion and neck supple.     Thyroid: No thyromegaly.  Cardiovascular:     Rate and Rhythm: Normal rate and regular rhythm.     Heart sounds: No murmur. No friction rub. No gallop.   Pulmonary:     Effort: Pulmonary effort is normal. No respiratory distress.     Breath sounds: Normal breath sounds. No wheezing.  Chest:     Breasts:        Right: No mass, skin change or tenderness.        Left: No mass, skin change or tenderness.  Abdominal:     General: Bowel sounds are normal. There is no distension.     Palpations: Abdomen is soft.     Tenderness: There is no abdominal tenderness. There is no rebound.  Musculoskeletal: Normal range of  motion.  Neurological:     Mental Status: She is alert and oriented to person, place, and time.     Cranial Nerves: No cranial nerve deficit.  Skin:    General: Skin is warm and dry.  Psychiatric:        Judgment: Judgment normal.  Vitals signs reviewed.     Assessment: 1. Right ovarian cyst   2. RLQ abdominal pain   Plan laparoscopy and right ovarian cystectomy, possible oophorectomy Surgery date moved sooner due to worsening pain refractory to narcotic and conservative therapies  I have had a careful discussion with this patient about all the options available and the risk/benefits of each. I have fully informed this patient that surgery may subject her to  a variety of discomforts and risks: She understands that most patients have surgery with little difficulty, but problems can happen ranging from minor to fatal. These include nausea, vomiting, pain, bleeding, infection, poor healing, hernia, or formation of adhesions. Unexpected reactions may occur from any drug or anesthetic given. Unintended injury may occur to other pelvic or abdominal structures such as Fallopian tubes, ovaries, bladder, ureter (tube from kidney to bladder), or bowel. Nerves going from the pelvis to the legs may be injured. Any such injury may require immediate or later additional surgery to correct the problem. Excessive blood loss requiring transfusion is very unlikely but possible. Dangerous blood clots may form in the legs or lungs. Physical and sexual activity will be restricted in varying degrees for an indeterminate period of time but most often 2-6 weeks.  Finally, she understands that it is impossible to list every possible undesirable effect and that the condition for which surgery is done is not always cured or significantly improved, and in rare cases may be even worse.Ample time was given to answer all questions.  Annamarie Major, MD, [facility], [facility] 09/23/2018  3:29 PM

## 2018-09-23 NOTE — Patient Instructions (Signed)
Ovarian Cystectomy, Care After  This sheet gives you information about how to care for yourself after your procedure. Your health care provider may also give you more specific instructions. If you have problems or questions, contact your health care provider.  What can I expect after the procedure?  After the procedure, it is common to have:  Pain in your abdomen, especially at the incision areas. You will be given pain medicines to control the pain.  Tiredness. This is a normal part of the recovery process. Your energy level will return to normal over the next several weeks.  Problems passing stool (constipation).  Follow these instructions at home:  Medicines  Take over-the-counter and prescription medicines only as told by your health care provider.  If you were prescribed an antibiotic medicine, use it as told by your health care provider. Do not stop using the antibiotic even if you start to feel better.  Do not take aspirin because it can cause bleeding.  Do not drink alcohol while taking prescription pain medicine.  Do not drive or use heavy machinery while taking prescription pain medicine.  Incision care    Follow instructions from your health care provider about how to take care of your incisions. Make sure you:  Wash your hands with soap and water before you change your bandage (dressing). If soap and water are not available, use hand sanitizer.  Change your dressing as told by your health care provider.  Leave stitches (sutures), skin glue, or adhesive strips in place. These skin closures may need to stay in place for 2 weeks or longer. If adhesive strip edges start to loosen and curl up, you may trim the loose edges. Do not remove adhesive strips completely unless your health care provider tells you to do that.  Check your incision areas every day for signs of infection. Check for:  Redness, swelling, or pain.  Fluid or blood.  Warmth.  Pus or a bad smell.  Do not take baths, swim, or use a hot tub  until your health care provider approves. Take showers instead of baths.  Activity  Return to your normal activities and diet as told by your health care provider. Ask your health care provider what activities are safe for you.  Take rest breaks during the day as needed.  Do not drive until your health care provider approves.  General instructions  Do not douche, use tampons, or have sexual intercourse until your health care provider says it is okay to do so.  To prevent or treat constipation while you are taking prescription pain medicine, your health care provider may recommend that you:  Take over-the-counter or prescription medicines.  Eat foods that are high in fiber, such as fresh fruits and vegetables, whole grains, and beans.  Drink enough fluid to keep your urine clear or pale yellow.  Limit foods that are high in fat and processed sugars, such as fried and sweet foods.  Keep all follow-up visits as told by your health care provider. This is important.  Contact a health care provider if:  You have a fever.  You feel nauseous or you vomit.  You have pain when you urinate or have blood in your urine.  You have a rash on your body.  You have pain or redness where the IV was inserted.  You have pain that is not relieved with medicine.  You have signs of infection, such as:  Redness, swelling, or pain around your incisions.  Fluid   or blood coming from your incisions.  An incision that feels warm to the touch.  Pus or a bad smell coming from your incisions.  Get help right away if:  You have chest pain or shortness of breath.  You feel dizzy or light-headed.  You have increasing abdominal pain that is not relieved with medicines.  You have pain, swelling, or redness in your leg.  Your incision is opening (the edges are not staying together).  Summary  After the procedure, it is common to have some pain in your abdomen. You will be given pain medicines to control the pain.  Follow instructions from your health  care provider about how to take care of your incisions.  Do not douche, use tampons, or have sexual intercourse until your health care provider says it is okay to do so.  Keep all follow-up visits as told by your health care provider. This is important.  This information is not intended to replace advice given to you by your health care provider. Make sure you discuss any questions you have with your health care provider.  Document Released: 02/09/2013 Document Revised: 06/10/2016 Document Reviewed: 06/10/2016  Elsevier Interactive Patient Education © 2019 [employer]

## 2018-09-23 NOTE — Patient Instructions (Addendum)
Your procedure is scheduled on: 09-28-18 TUESDAY Report to Same Day Surgery 2nd floor medical mall [facility] Entrance-take elevator on left to 2nd floor.  Check in with surgery information desk.) To find out your arrival time please call [phone] between 1PM - 3PM on 09-24-18 FRIDAY  Remember: Instructions that are not followed completely may result in serious medical risk, up to and including death, or upon the discretion of your surgeon and anesthesiologist your surgery may need to be rescheduled.    _x___ 1. Do not eat food after midnight the night before your procedure. NO GUM OR CANDY AFTER MIDNIGHT.  You may drink clear liquids up to 2 hours before you are scheduled to arrive at the hospital for your procedure.  Do not drink clear liquids within 2 hours of your scheduled arrival to the hospital.  Clear liquids include  --Water or Apple juice without pulp  --Clear carbohydrate beverage such as ClearFast or Gatorade  --Black Coffee or Clear Tea (No milk, no creamers, do not add anything to the coffee or Tea   ____Ensure clear carbohydrate drink on the way to the hospital for bariatric patients  _X___Ensure clear carbohydrate drink 3 hours before surgery     __x__ 2. No Alcohol for 24 hours before or after surgery.   __x__3. No Smoking or e-cigarettes for 24 prior to surgery.  Do not use any chewable tobacco products for at least 6 hour prior to surgery   ____  4. Bring all medications with you on the day of surgery if instructed.    __x__ 5. Notify your doctor if there is any change in your medical condition     (cold, fever, infections).    x___6. On the morning of surgery brush your teeth with toothpaste and water.  You may rinse your mouth with mouth wash if you wish.  Do not swallow any toothpaste or mouthwash.   Do not wear jewelry, make-up, hairpins, clips or nail polish.  Do not wear lotions, powders, or perfumes. You may wear deodorant.  Do not shave 48 hours  prior to surgery. Men may shave face and neck.  Do not bring valuables to the hospital.    [facility] is not responsible for any belongings or valuables.               Contacts, dentures or bridgework may not be worn into surgery.  Leave your suitcase in the car. After surgery it may be brought to your room.  For patients admitted to the hospital, discharge time is determined by your treatment team.  _  Patients discharged the day of surgery will not be allowed to drive home.  You will need someone to drive you home and stay with you the night of your procedure.    Please read over the following fact sheets that you were given:   [facility] Preparing for Surgery AND INCENTIVE SPIROMETER  ____ Take anti-hypertensive listed below, cardiac, seizure, asthma, anti-reflux and psychiatric medicines. These include:  1. NONE  2.  3.  4.  5.  6.  ____Fleets enema or Magnesium Citrate as directed.   _x___ Use CHG Soap or sage wipes as directed on instruction sheet   ____ Use inhalers on the day of surgery and bring to hospital day of surgery  ____ Stop Metformin and Janumet 2 days prior to surgery.    ____ Take 1/2 of usual insulin dose the night before surgery and none on the morning surgery.  ____ Follow recommendations from Cardiologist, Pulmonologist or PCP regarding stopping Aspirin, Coumadin, Plavix ,Eliquis, Effient, or Pradaxa, and Pletal.  X____Stop Anti-inflammatories such as Advil, Aleve, Ibuprofen, Motrin, Naproxen, Naprosyn, Goodies powders or aspirin products. OK to take Tylenol OR PERCOCET IF NEEDED   ____ Stop supplements until after surgery.     ____ Bring C-Pap to the hospital.

## 2018-09-23 NOTE — H&P (View-Only) (Signed)
°   PRE-OPERATIVE HISTORY AND PHYSICAL EXAM ° °HPI:  Maria Fuller is a 32 y.o. G2P1102 No LMP recorded. (Menstrual status: IUD).; she is being admitted for surgery related to adnexal mass and pelvic pain.  The pain is described as sharp and stabbing, and is 8/10 in intensity. Pain is located in the RLQ and deep pelvis area without radiation. Onset was ongoing occurring a few months ago. Symptoms have been gradually worsening since. Aggravating factors: activity. Alleviating factors: minimal response to IBF, Tylenol, even percocet. Associated symptoms: affects mood and depression; some nausea; insomnia. The patient denies constipation, diarrhea and fever. Risk factors for pelvic/abdominal pain include PCOS; possible endometriosis.   Pt also has Mirena IUD since Jan 2020.. °  °Ultrasound demonstrates :right ovarian 2.6cm complex cyst; IUD ° °PMHx: °Past Medical History:  °Diagnosis Date  °• Bradycardia   °• Depression   °• Diabetes mellitus without complication (HCC)   ° resolved with weight loss from gastric bypass  °• Headache   ° migraines. resolved since gastric bypass  °• History of kidney stones   ° h/o  °• Hypertension   ° resolved since gastric bypass, severe pre-eclampsia with G1  °• Meningitis 2015  °• PCOS (polycystic ovarian syndrome)   °• Preterm labor   °• Sleep apnea   ° had gastric bypass surgery and now resolved  °• Syncope   ° °Past Surgical History:  °Procedure Laterality Date  °• CESAREAN SECTION    °• CESAREAN SECTION N/A 05/07/2016  ° Procedure: CESAREAN SECTION;  Surgeon: Jayda White P Dniya Neuhaus, MD;  Location: [facility] OR[facility];  Service: Obstetrics;  Laterality: N/A;  Time of birth: [birth year] °Sex: Female °Weight: 3940 kg, 8 lb 11 oz  °• CHOLECYSTECTOMY    °• CYSTOSCOPY WITH STENT PLACEMENT Right 09/19/2016  ° Procedure: CYSTOSCOPY WITH STENT PLACEMENT;  Surgeon: Budzyn, Brian James, MD;  Location: [facility];  Service: Urology;  Laterality: Right;  °• GASTRIC BYPASS  04/2015  °• LEFT HEART CATH AND CORONARY  ANGIOGRAPHY Left 04/07/2018  ° Procedure: LEFT HEART CATH AND CORONARY ANGIOGRAPHY;  Surgeon: Callwood, Dwayne D, MD;  Location: [facility];  Service: Cardiovascular;  Laterality: Left;  °• TONSILLECTOMY    °• URETEROSCOPY WITH HOLMIUM LASER LITHOTRIPSY Right 09/19/2016  ° Procedure: URETEROSCOPY WITH HOLMIUM LASER LITHOTRIPSY;  Surgeon: Budzyn, Brian James, MD;  Location: [facility];  Service: Urology;  Laterality: Right;  ° °Family History  °Problem Relation Age of Onset  °• Polycystic ovary syndrome Mother   °• Diabetes Father   °• Endometriosis Sister   °• Polycystic ovary syndrome Sister   °• Breast cancer Paternal Grandmother   °• Diabetes Paternal Grandmother   °• Diabetes Paternal Grandfather   °• Kidney cancer Neg Hx   °• Prostate cancer Neg Hx   °• Bladder Cancer Neg Hx   ° °Social History  ° °Tobacco Use  °• Smoking status: Never Smoker  °• Smokeless tobacco: Never Used  °Substance Use Topics  °• Alcohol use: No  °• Drug use: No  ° ° °Current Outpatient Medications:  °•  acetaminophen (TYLENOL) 325 MG tablet, Take 650 mg by mouth every 6 (six) hours as needed (for pain.)., Disp: , Rfl:  °•  isosorbide mononitrate (IMDUR) 30 MG 24 hr tablet, Take 30 mg by mouth daily., Disp: , Rfl:  °•  levonorgestrel (MIRENA) 20 MCG/24HR IUD, 1 each by Intrauterine route once., Disp: , Rfl:  °•  meclizine (ANTIVERT) 12.5 MG tablet, Take 1 tablet (12.5 mg total) by   mouth 3 (three) times daily as needed for dizziness., Disp: 14 tablet, Rfl: 0   oxyCODONE-acetaminophen (PERCOCET/ROXICET) 5-325 MG tablet, Take 1-2 tablets by mouth every 6 (six) hours as needed., Disp: 20 tablet, Rfl: 0   sertraline (ZOLOFT) 100 MG tablet, Take 200 mg by mouth at bedtime. , Disp: , Rfl:    traZODone (DESYREL) 50 MG tablet, Take 50 mg by mouth at bedtime. , Disp: , Rfl:  Allergies: Ceftin [cefuroxime axetil] and Penicillins  Review of Systems  Constitutional: Negative for chills, fever and malaise/fatigue.  HENT: Negative for  congestion, sinus pain and sore throat.   Eyes: Negative for blurred vision and pain.  Respiratory: Negative for cough and wheezing.   Cardiovascular: Negative for chest pain and leg swelling.  Gastrointestinal: Negative for abdominal pain, constipation, diarrhea, heartburn, nausea and vomiting.  Genitourinary: Negative for dysuria, frequency, hematuria and urgency.  Musculoskeletal: Negative for back pain, joint pain, myalgias and neck pain.  Skin: Negative for itching and rash.  Neurological: Negative for dizziness, tremors and weakness.  Endo/Heme/Allergies: Does not bruise/bleed easily.  Psychiatric/Behavioral: Negative for depression. The patient is not nervous/anxious and does not have insomnia.     Objective: BP 130/80    Ht 6\' 1"  (1.854 m)    Wt 278 lb (126.1 kg)    BMI 36.68 kg/m   Filed Weights   09/23/18 1500  Weight: 278 lb (126.1 kg)   Physical Exam Constitutional:      General: She is not in acute distress.    Appearance: She is well-developed.  HENT:     Head: Normocephalic and atraumatic. No laceration.     Right Ear: Hearing normal.     Left Ear: Hearing normal.     Mouth/Throat:     Pharynx: Uvula midline.  Eyes:     Pupils: Pupils are equal, round, and reactive to light.  Neck:     Musculoskeletal: Normal range of motion and neck supple.     Thyroid: No thyromegaly.  Cardiovascular:     Rate and Rhythm: Normal rate and regular rhythm.     Heart sounds: No murmur. No friction rub. No gallop.   Pulmonary:     Effort: Pulmonary effort is normal. No respiratory distress.     Breath sounds: Normal breath sounds. No wheezing.  Chest:     Breasts:        Right: No mass, skin change or tenderness.        Left: No mass, skin change or tenderness.  Abdominal:     General: Bowel sounds are normal. There is no distension.     Palpations: Abdomen is soft.     Tenderness: There is no abdominal tenderness. There is no rebound.  Musculoskeletal: Normal range of  motion.  Neurological:     Mental Status: She is alert and oriented to person, place, and time.     Cranial Nerves: No cranial nerve deficit.  Skin:    General: Skin is warm and dry.  Psychiatric:        Judgment: Judgment normal.  Vitals signs reviewed.     Assessment: 1. Right ovarian cyst   2. RLQ abdominal pain   Plan laparoscopy and right ovarian cystectomy, possible oophorectomy Surgery date moved sooner due to worsening pain refractory to narcotic and conservative therapies  I have had a careful discussion with this patient about all the options available and the risk/benefits of each. I have fully informed this patient that surgery may subject her to  a variety of discomforts and risks: She understands that most patients have surgery with little difficulty, but problems can happen ranging from minor to fatal. These include nausea, vomiting, pain, bleeding, infection, poor healing, hernia, or formation of adhesions. Unexpected reactions may occur from any drug or anesthetic given. Unintended injury may occur to other pelvic or abdominal structures such as Fallopian tubes, ovaries, bladder, ureter (tube from kidney to bladder), or bowel. Nerves going from the pelvis to the legs may be injured. Any such injury may require immediate or later additional surgery to correct the problem. Excessive blood loss requiring transfusion is very unlikely but possible. Dangerous blood clots may form in the legs or lungs. Physical and sexual activity will be restricted in varying degrees for an indeterminate period of time but most often 2-6 weeks.  Finally, she understands that it is impossible to list every possible undesirable effect and that the condition for which surgery is done is not always cured or significantly improved, and in rare cases may be even worse.Ample time was given to answer all questions.  Annamarie Major, MD, [facility], [facility] 09/23/2018  3:29 PM

## 2018-09-24 LAB — NOVEL CORONAVIRUS, NAA (HOSP ORDER, SEND-OUT TO REF LAB; TAT 18-24 HRS): SARS-CoV-2, NAA: NOT DETECTED

## 2018-09-28 ENCOUNTER — Encounter
Admission: RE | Disposition: A | Discharge status: Home / Self Care | Payer: Self-pay | Source: Home / Self Care | Attending: Obstetrics & Gynecology

## 2018-09-28 ENCOUNTER — Ambulatory Visit: Payer: BC Managed Care – PPO | Admitting: Anesthesiology

## 2018-09-28 ENCOUNTER — Ambulatory Visit
Admission: RE | Admit: 2018-09-28 | Discharge: 2018-09-28 | Disposition: A | Discharge status: Home / Self Care | Payer: BC Managed Care – PPO | Attending: Obstetrics & Gynecology | Admitting: Obstetrics & Gynecology

## 2018-09-28 ENCOUNTER — Other Ambulatory Visit: Payer: Self-pay

## 2018-09-28 ENCOUNTER — Encounter: Payer: Self-pay | Admitting: *Deleted

## 2018-09-28 DIAGNOSIS — G4730 Sleep apnea, unspecified: Secondary | ICD-10-CM | POA: Insufficient documentation

## 2018-09-28 DIAGNOSIS — E669 Obesity, unspecified: Secondary | ICD-10-CM | POA: Diagnosis not present

## 2018-09-28 DIAGNOSIS — K6289 Other specified diseases of anus and rectum: Secondary | ICD-10-CM | POA: Diagnosis present

## 2018-09-28 DIAGNOSIS — Z833 Family history of diabetes mellitus: Secondary | ICD-10-CM | POA: Diagnosis not present

## 2018-09-28 DIAGNOSIS — Z6836 Body mass index (BMI) 36.0-36.9, adult: Secondary | ICD-10-CM | POA: Insufficient documentation

## 2018-09-28 DIAGNOSIS — R1031 Right lower quadrant pain: Secondary | ICD-10-CM | POA: Diagnosis present

## 2018-09-28 DIAGNOSIS — F329 Major depressive disorder, single episode, unspecified: Secondary | ICD-10-CM | POA: Insufficient documentation

## 2018-09-28 DIAGNOSIS — E119 Type 2 diabetes mellitus without complications: Secondary | ICD-10-CM | POA: Diagnosis not present

## 2018-09-28 DIAGNOSIS — Z842 Family history of other diseases of the genitourinary system: Secondary | ICD-10-CM | POA: Insufficient documentation

## 2018-09-28 DIAGNOSIS — Z87442 Personal history of urinary calculi: Secondary | ICD-10-CM | POA: Insufficient documentation

## 2018-09-28 DIAGNOSIS — N83201 Unspecified ovarian cyst, right side: Secondary | ICD-10-CM | POA: Diagnosis present

## 2018-09-28 DIAGNOSIS — Z9884 Bariatric surgery status: Secondary | ICD-10-CM | POA: Insufficient documentation

## 2018-09-28 DIAGNOSIS — Z881 Allergy status to other antibiotic agents status: Secondary | ICD-10-CM | POA: Diagnosis not present

## 2018-09-28 DIAGNOSIS — I10 Essential (primary) hypertension: Secondary | ICD-10-CM | POA: Insufficient documentation

## 2018-09-28 DIAGNOSIS — E282 Polycystic ovarian syndrome: Secondary | ICD-10-CM | POA: Insufficient documentation

## 2018-09-28 DIAGNOSIS — G8929 Other chronic pain: Secondary | ICD-10-CM | POA: Diagnosis present

## 2018-09-28 DIAGNOSIS — Z79899 Other long term (current) drug therapy: Secondary | ICD-10-CM | POA: Diagnosis not present

## 2018-09-28 DIAGNOSIS — Z793 Long term (current) use of hormonal contraceptives: Secondary | ICD-10-CM | POA: Insufficient documentation

## 2018-09-28 DIAGNOSIS — Z9049 Acquired absence of other specified parts of digestive tract: Secondary | ICD-10-CM | POA: Diagnosis not present

## 2018-09-28 DIAGNOSIS — R102 Pelvic and perineal pain: Secondary | ICD-10-CM | POA: Diagnosis not present

## 2018-09-28 DIAGNOSIS — Z880 Allergy status to penicillin: Secondary | ICD-10-CM | POA: Insufficient documentation

## 2018-09-28 DIAGNOSIS — N83291 Other ovarian cyst, right side: Secondary | ICD-10-CM | POA: Insufficient documentation

## 2018-09-28 DIAGNOSIS — G4700 Insomnia, unspecified: Secondary | ICD-10-CM | POA: Diagnosis not present

## 2018-09-28 HISTORY — PX: LAPAROSCOPIC OVARIAN CYSTECTOMY: SHX6248

## 2018-09-28 LAB — POCT PREGNANCY, URINE: Preg Test, Ur: NEGATIVE

## 2018-09-28 SURGERY — EXCISION, CYST, OVARY, LAPAROSCOPIC
Anesthesia: General | Laterality: Right

## 2018-09-28 MED ORDER — FENTANYL CITRATE (PF) 100 MCG/2ML IJ SOLN
25.0000 ug | INTRAMUSCULAR | Status: DC | PRN
  Administered 2018-09-28: 50 ug via INTRAVENOUS

## 2018-09-28 MED ORDER — OXYCODONE HCL 5 MG/5ML PO SOLN: 5.0000 mg | Freq: Once | ORAL | Status: DC | PRN

## 2018-09-28 MED ORDER — OXYCODONE-ACETAMINOPHEN 5-325 MG PO TABS
ORAL_TABLET | ORAL | Status: AC
  Filled 2018-09-28: qty 1

## 2018-09-28 MED ORDER — FENTANYL CITRATE (PF) 100 MCG/2ML IJ SOLN
INTRAMUSCULAR | Status: DC | PRN
  Administered 2018-09-28 (×2): 50 ug via INTRAVENOUS

## 2018-09-28 MED ORDER — DEXAMETHASONE SODIUM PHOSPHATE 10 MG/ML IJ SOLN
INTRAMUSCULAR | Status: DC | PRN
  Administered 2018-09-28: 10 mg via INTRAVENOUS

## 2018-09-28 MED ORDER — EPHEDRINE SULFATE 50 MG/ML IJ SOLN
INTRAMUSCULAR | Status: DC | PRN
  Administered 2018-09-28: 5 mg via INTRAVENOUS
  Administered 2018-09-28: 10 mg via INTRAVENOUS
  Administered 2018-09-28: 5 mg via INTRAVENOUS

## 2018-09-28 MED ORDER — OXYCODONE-ACETAMINOPHEN 5-325 MG PO TABS: 1.0000 | ORAL_TABLET | ORAL | 0 refills | Status: DC | PRN

## 2018-09-28 MED ORDER — FENTANYL CITRATE (PF) 100 MCG/2ML IJ SOLN
INTRAMUSCULAR | Status: AC
  Filled 2018-09-28: qty 2

## 2018-09-28 MED ORDER — MIDAZOLAM HCL 2 MG/2ML IJ SOLN
INTRAMUSCULAR | Status: AC
  Filled 2018-09-28: qty 2

## 2018-09-28 MED ORDER — MORPHINE SULFATE (PF) 4 MG/ML IV SOLN: 1.0000 mg | INTRAVENOUS | Status: DC | PRN

## 2018-09-28 MED ORDER — PROPOFOL 10 MG/ML IV BOLUS
INTRAVENOUS | Status: DC | PRN
  Administered 2018-09-28: 200 mg via INTRAVENOUS

## 2018-09-28 MED ORDER — LACTATED RINGERS IV SOLN
INTRAVENOUS | Status: DC
  Administered 2018-09-28: 08:00:00 via INTRAVENOUS

## 2018-09-28 MED ORDER — OXYCODONE-ACETAMINOPHEN 5-325 MG PO TABS
1.0000 | ORAL_TABLET | ORAL | Status: DC | PRN
  Administered 2018-09-28: 1 via ORAL

## 2018-09-28 MED ORDER — ACETAMINOPHEN 650 MG RE SUPP
650.0000 mg | RECTAL | Status: DC | PRN
  Filled 2018-09-28: qty 1

## 2018-09-28 MED ORDER — LIDOCAINE HCL (CARDIAC) PF 100 MG/5ML IV SOSY
PREFILLED_SYRINGE | INTRAVENOUS | Status: DC | PRN
  Administered 2018-09-28: 100 mg via INTRAVENOUS

## 2018-09-28 MED ORDER — PROMETHAZINE HCL 25 MG/ML IJ SOLN: 6.2500 mg | INTRAMUSCULAR | Status: DC | PRN

## 2018-09-28 MED ORDER — BUPIVACAINE HCL (PF) 0.5 % IJ SOLN
INTRAMUSCULAR | Status: AC
  Filled 2018-09-28: qty 30

## 2018-09-28 MED ORDER — ROCURONIUM BROMIDE 50 MG/5ML IV SOLN
INTRAVENOUS | Status: AC
  Filled 2018-09-28: qty 1

## 2018-09-28 MED ORDER — ONDANSETRON HCL 4 MG/2ML IJ SOLN
INTRAMUSCULAR | Status: DC | PRN
  Administered 2018-09-28: 4 mg via INTRAVENOUS

## 2018-09-28 MED ORDER — PROPOFOL 10 MG/ML IV BOLUS
INTRAVENOUS | Status: AC
  Filled 2018-09-28: qty 20

## 2018-09-28 MED ORDER — LACTATED RINGERS IV SOLN: INTRAVENOUS | Status: DC

## 2018-09-28 MED ORDER — SEVOFLURANE IN SOLN
RESPIRATORY_TRACT | Status: AC
  Filled 2018-09-28: qty 250

## 2018-09-28 MED ORDER — ACETAMINOPHEN 325 MG PO TABS: 650.0000 mg | ORAL_TABLET | ORAL | Status: DC | PRN

## 2018-09-28 MED ORDER — MIDAZOLAM HCL 2 MG/2ML IJ SOLN
INTRAMUSCULAR | Status: DC | PRN
  Administered 2018-09-28: 2 mg via INTRAVENOUS

## 2018-09-28 MED ORDER — ATROPINE SULFATE 0.4 MG/ML IJ SOLN
INTRAMUSCULAR | Status: AC
  Filled 2018-09-28: qty 1

## 2018-09-28 MED ORDER — KETOROLAC TROMETHAMINE 30 MG/ML IJ SOLN
30.0000 mg | Freq: Four times a day (QID) | INTRAMUSCULAR | Status: DC
  Filled 2018-09-28: qty 1

## 2018-09-28 MED ORDER — OXYCODONE HCL 5 MG PO TABS: 5.0000 mg | ORAL_TABLET | Freq: Once | ORAL | Status: DC | PRN

## 2018-09-28 MED ORDER — OXYCODONE-ACETAMINOPHEN 5-325 MG PO TABS: 1.0000 | ORAL_TABLET | Freq: Once | ORAL | Status: DC

## 2018-09-28 MED ORDER — SUGAMMADEX SODIUM 500 MG/5ML IV SOLN
INTRAVENOUS | Status: DC | PRN
  Administered 2018-09-28: 252.2 mg via INTRAVENOUS

## 2018-09-28 MED ORDER — BUPIVACAINE HCL (PF) 0.5 % IJ SOLN
INTRAMUSCULAR | Status: DC | PRN
  Administered 2018-09-28: 15 mL

## 2018-09-28 MED ORDER — SUCCINYLCHOLINE CHLORIDE 20 MG/ML IJ SOLN
INTRAMUSCULAR | Status: AC
  Filled 2018-09-28: qty 1

## 2018-09-28 MED ORDER — ROCURONIUM BROMIDE 100 MG/10ML IV SOLN
INTRAVENOUS | Status: DC | PRN
  Administered 2018-09-28: 50 mg via INTRAVENOUS
  Administered 2018-09-28 (×2): 20 mg via INTRAVENOUS

## 2018-09-28 MED ORDER — MEPERIDINE HCL 50 MG/ML IJ SOLN: 6.2500 mg | INTRAMUSCULAR | Status: DC | PRN

## 2018-09-28 SURGICAL SUPPLY — 41 items
BLADE SURG SZ11 CARB STEEL (BLADE) ×2 IMPLANT
CANISTER SUCT 1200ML W/VALVE (MISCELLANEOUS) ×2 IMPLANT
CATH ROBINSON RED A/P 16FR (CATHETERS) ×2 IMPLANT
CHLORAPREP W/TINT 26 (MISCELLANEOUS) ×2 IMPLANT
COVER WAND RF STERILE (DRAPES) ×1 IMPLANT
DERMABOND ADVANCED (GAUZE/BANDAGES/DRESSINGS) ×1
DERMABOND ADVANCED .7 DNX12 (GAUZE/BANDAGES/DRESSINGS) ×1 IMPLANT
DRSG TELFA 4X3 1S NADH ST (GAUZE/BANDAGES/DRESSINGS) IMPLANT
GLOVE BIO SURGEON STRL SZ8 (GLOVE) ×4 IMPLANT
GLOVE INDICATOR 8.0 STRL GRN (GLOVE) ×2 IMPLANT
GOWN STRL REUS W/ TWL LRG LVL3 (GOWN DISPOSABLE) ×1 IMPLANT
GOWN STRL REUS W/ TWL XL LVL3 (GOWN DISPOSABLE) ×1 IMPLANT
GOWN STRL REUS W/TWL LRG LVL3 (GOWN DISPOSABLE) ×1
GOWN STRL REUS W/TWL XL LVL3 (GOWN DISPOSABLE) ×1
GRASPER SUT TROCAR 14GX15 (MISCELLANEOUS) ×2 IMPLANT
IRRIGATION STRYKERFLOW (MISCELLANEOUS) IMPLANT
IRRIGATOR STRYKERFLOW (MISCELLANEOUS)
IV LACTATED RINGER IRRG 3000ML (IV SOLUTION)
IV LR IRRIG 3000ML ARTHROMATIC (IV SOLUTION) IMPLANT
KIT PINK PAD W/HEAD ARE REST (MISCELLANEOUS) ×2
KIT PINK PAD W/HEAD ARM REST (MISCELLANEOUS) ×1 IMPLANT
LABEL OR SOLS (LABEL) ×2 IMPLANT
NEEDLE VERESS 14GA 120MM (NEEDLE) ×2 IMPLANT
NS IRRIG 500ML POUR BTL (IV SOLUTION) ×2 IMPLANT
PACK GYN LAPAROSCOPIC (MISCELLANEOUS) ×2 IMPLANT
PAD PREP 24X41 OB/GYN DISP (PERSONAL CARE ITEMS) ×2 IMPLANT
POUCH SPECIMEN RETRIEVAL 10MM (ENDOMECHANICALS) IMPLANT
SCISSORS METZENBAUM CVD 33 (INSTRUMENTS) ×2 IMPLANT
SET TUBE SMOKE EVAC HIGH FLOW (TUBING) ×2 IMPLANT
SHEARS HARMONIC ACE PLUS 36CM (ENDOMECHANICALS) ×1 IMPLANT
SLEEVE ENDOPATH XCEL 5M (ENDOMECHANICALS) IMPLANT
SPONGE GAUZE 2X2 8PLY STRL LF (GAUZE/BANDAGES/DRESSINGS) IMPLANT
STRAP SAFETY 5IN WIDE (MISCELLANEOUS) ×2 IMPLANT
SUT VIC AB 0 CT1 36 (SUTURE) ×2 IMPLANT
SUT VIC AB 2-0 UR6 27 (SUTURE) ×1 IMPLANT
SUT VIC AB 4-0 PS2 18 (SUTURE) IMPLANT
SYR 10ML LL (SYRINGE) ×2 IMPLANT
SYR 20CC LL (SYRINGE) ×1 IMPLANT
SYSTEM WECK SHIELD CLOSURE (TROCAR) IMPLANT
TROCAR ENDO BLADELESS 11MM (ENDOMECHANICALS) ×1 IMPLANT
TROCAR XCEL NON-BLD 5MMX100MML (ENDOMECHANICALS) ×3 IMPLANT

## 2018-09-28 NOTE — Op Note (Signed)
  Operative Note   09/28/2018  PRE-OP DIAGNOSIS: Right Ovarian Cyst, Pelvic Pain   POST-OP DIAGNOSIS: same   PROCEDURE: Procedure(s): LAPAROSCOPIC RIGHT OVARIAN CYSTECTOMY   SURGEON: Annamarie Major, MD, FACOG  ANESTHESIA: Choice   ESTIMATED BLOOD LOSS: Min  COMPLICATIONS: None  DISPOSITION: PACU - hemodynamically stable.  CONDITION: stable  FINDINGS: Laparoscopic survey of the abdomen revealed a grossly normal uterus, tubes, ovaries, liver edge, gallbladder edge and appendix, No intra-abdominal adhesions were noted. Right Ovarian Cyst noted, complex but no signs of endometrioma; clear fluid.  No endometriosis implants noted.  Mild pelvic congestion left adnexal area.  PROCEDURE IN DETAIL: The patient was taken to the OR where anesthesia was administed. The patient was positioned in dorsal lithotomy in the [place] stirrups. The patient was then examined under anesthesia with the above noted findings. The patient was prepped and draped in the normal sterile fashion and foley catheter was placed. A Graves speculum was placed in the vagina and the anterior lip of the cervix was grasped with a single toothed tenaculum. A Hulka uterine manipulator was then inserted in the uterus. Uterine mobility was found to be satisfactory. The speculum was then removed.  Attention was turned to the patient's abdomen where a 5 mm skin incision was made in the umbilical fold, after injection of local anesthesia. The Veress step needle was carefully introduced into the peritoneal cavity with placement confirmed using the hanging drop technique.  Pneumoperitoneum was obtained. The 5 mm port was then placed under direct visualization with the operative laparoscope.  Trendelenburg positioning.  Additional 36mm trocar was then placed in the RLQ lateral to the inferior epigastric blood vessels under direct visualization with the laparoscope.  Instrumentation to visualize complete pelvic anatomy performed.  A 41mm trocar  was also then placed in the LLQ with same precautions.  The ovarian cyst is identified and stabilized.  An incision is made with clear fluid noted.  Fluid is aspirated.  Cyst wall is dissected free from the ovarian cortex and removed.  Hemostasis is visualized and assured.  Contralateral ovary seen as normal.  Pelvic cavity is cleaned with any fluid aspirated.  Instruments and trocars removed, gas expelled, and skin closed with skin adhesive glue after subcutaneous layer closed with 4-0 vicryl suture and left larger fascia incision closed with a 2-0 vicryl suture.    Speculum exam reveals hemostatic cervix from tenaculum. IUD strings seen and 2 cm.  Instrument, needle, and sponge counts correct x2 at the conclusion of the case.  Pt goes to recovery room in stable condition.  Annamarie Major, MD, [facility], [facility] 09/28/2018  9:37 AM

## 2018-09-28 NOTE — Transfer of Care (Signed)
Immediate Anesthesia Transfer of Care Note  Patient: Maria Fuller  Procedure(s) Performed: LAPAROSCOPIC RIGHT OVARIAN CYSTECTOMY (Right )  Patient Location: PACU  Anesthesia Type:General  Level of Consciousness: oriented and sedated  Airway & Oxygen Therapy: Patient Spontanous Breathing and Patient connected to face mask oxygen  Post-op Assessment: Report given to RN and Post -op Vital signs reviewed and stable  Post vital signs: Reviewed and stable  Last Vitals:  Vitals Value Taken Time  BP 123/71 09/28/2018  9:42 AM  Temp    Pulse 72 09/28/2018  9:44 AM  Resp 20 09/28/2018  9:44 AM  SpO2 98 % 09/28/2018  9:44 AM  Vitals shown include unvalidated device data.  Last Pain:  Vitals:   09/28/18 0808  TempSrc:   PainSc: 3          Complications: No apparent anesthesia complications

## 2018-09-28 NOTE — Discharge Instructions (Signed)
Ovarian Cystectomy, Care After °This sheet gives you information about how to care for yourself after your procedure. Your health care provider may also give you more specific instructions. If you have problems or questions, contact your health care provider. °What can I expect after the procedure? °After the procedure, it is common to have: °· Pain in your abdomen, especially at the incision areas. You will be given pain medicines to control the pain. °· Tiredness. This is a normal part of the recovery process. Your energy level will return to normal over the next several weeks. °· Problems passing stool (constipation). °Follow these instructions at home: °Medicines °· Take over-the-counter and prescription medicines only as told by your health care provider. °· If you were prescribed an antibiotic medicine, use it as told by your health care provider. Do not stop using the antibiotic even if you start to feel better. °· Do not take aspirin because it can cause bleeding. °· Do not drink alcohol while taking prescription pain medicine. °· Do not drive or use heavy machinery while taking prescription pain medicine. °Incision care ° °· Follow instructions from your health care provider about how to take care of your incisions. Make sure you: °? Wash your hands with soap and water before you change your bandage (dressing). If soap and water are not available, use hand sanitizer. °? Change your dressing as told by your health care provider. °? Leave stitches (sutures), skin glue, or adhesive strips in place. These skin closures may need to stay in place for 2 weeks or longer. If adhesive strip edges start to loosen and curl up, you may trim the loose edges. Do not remove adhesive strips completely unless your health care provider tells you to do that. °· Check your incision areas every day for signs of infection. Check for: °? Redness, swelling, or pain. °? Fluid or blood. °? Warmth. °? Pus or a bad smell. °· Do not  take baths, swim, or use a hot tub until your health care provider approves. Take showers instead of baths. °Activity °· Return to your normal activities and diet as told by your health care provider. Ask your health care provider what activities are safe for you. °· Take rest breaks during the day as needed. °· Do not drive until your health care provider approves. °General instructions °· Do not douche, use tampons, or have sexual intercourse until your health care provider says it is okay to do so. °· To prevent or treat constipation while you are taking prescription pain medicine, your health care provider may recommend that you: °? Take over-the-counter or prescription medicines. °? Eat foods that are high in fiber, such as fresh fruits and vegetables, whole grains, and beans. °? Drink enough fluid to keep your urine clear or pale yellow. °? Limit foods that are high in fat and processed sugars, such as fried and sweet foods. °· Keep all follow-up visits as told by your health care provider. This is important. °Contact a health care provider if: °· You have a fever. °· You feel nauseous or you vomit. °· You have pain when you urinate or have blood in your urine. °· You have a rash on your body. °· You have pain or redness where the IV was inserted. °· You have pain that is not relieved with medicine. °· You have signs of infection, such as: °? Redness, swelling, or pain around your incisions. °? Fluid or blood coming from your incisions. °? An incision that   feels warm to the touch. ? Pus or a bad smell coming from your incisions. Get help right away if:  You have chest pain or shortness of breath.  You feel dizzy or light-headed.  You have increasing abdominal pain that is not relieved with medicines.  You have pain, swelling, or redness in your leg.  Your incision is opening (the edges are not staying together). Summary  After the procedure, it is common to have some pain in your abdomen. You  will be given pain medicines to control the pain.  Follow instructions from your health care provider about how to take care of your incisions.  Do not douche, use tampons, or have sexual intercourse until your health care provider says it is okay to do so.  Keep all follow-up visits as told by your health care provider. This is important. This information is not intended to replace advice given to you by your health care provider. Make sure you discuss any questions you have with your health care provider. Document Released: 02/09/2013 Document Revised: 06/10/2016 Document Reviewed: 06/10/2016 Elsevier Interactive Patient Education  2019 [employer]  AMBULATORY SURGERY  DISCHARGE INSTRUCTIONS   1) The drugs that you were given will stay in your system until tomorrow so for the next 24 hours you should not:  A) Drive an automobile B) Make any legal decisions C) Drink any alcoholic beverage   2) You may resume regular meals tomorrow.  Today it is better to start with liquids and gradually work up to solid foods.  You may eat anything you prefer, but it is better to start with liquids, then soup and crackers, and gradually work up to solid foods.   3) Please notify your doctor immediately if you have any unusual bleeding, trouble breathing, redness and pain at the surgery site, drainage, fever, or pain not relieved by medication.    4) Additional Instructions:        Please contact your physician with any problems or Same Day Surgery at [phone], Monday through Friday 6 am to 4 pm, or [place] at [facility] number at [phone].

## 2018-09-28 NOTE — Anesthesia Post-op Follow-up Note (Signed)
Anesthesia QCDR form completed.        

## 2018-09-28 NOTE — Interval H&P Note (Signed)
History and Physical Interval Note:  09/28/2018 7:54 AM  Maria Fuller  has presented today for surgery, with the diagnosis of PELVIC PAIN, RIGHT OVARIAN CYST RLQ PAIN.  The various methods of treatment have been discussed with the patient and family. After consideration of risks, benefits and other options for treatment, the patient has consented to  Procedure(s): LAPAROSCOPIC RIGHT OVARIAN CYSTECTOMY - POSSIBLE OOPHORECTOMY (Right) as a surgical intervention.  The patient's history has been reviewed, patient examined, no change in status, stable for surgery.  I have reviewed the patient's chart and labs.  Questions were answered to the patient's satisfaction.     Letitia Libra

## 2018-09-28 NOTE — Anesthesia Postprocedure Evaluation (Signed)
Anesthesia Post Note  Patient: Maria Fuller  Procedure(s) Performed: LAPAROSCOPIC RIGHT OVARIAN CYSTECTOMY (Right )  Patient location during evaluation: PACU Anesthesia Type: General Level of consciousness: awake and alert and oriented Pain management: pain level controlled Vital Signs Assessment: post-procedure vital signs reviewed and stable Respiratory status: spontaneous breathing, nonlabored ventilation and respiratory function stable Cardiovascular status: blood pressure returned to baseline and stable Postop Assessment: no signs of nausea or vomiting Anesthetic complications: no     Last Vitals:  Vitals:   09/28/18 1035 09/28/18 1144  BP: 119/66 117/68  Pulse: 63 64  Resp: 16 14  Temp: 36.6 C (!) 36.3 C  SpO2: 100% 100%    Last Pain:  Vitals:   09/28/18 1144  TempSrc: Temporal  PainSc: 2                  Oz Gammel

## 2018-09-28 NOTE — Anesthesia Preprocedure Evaluation (Signed)
Anesthesia Evaluation  Patient identified by MRN, date of birth, ID band Patient awake    Reviewed: Allergy & Precautions, NPO status , Patient's Chart, lab work & pertinent test results  History of Anesthesia Complications Negative for: history of anesthetic complications  Airway Mallampati: I  TM Distance: >3 FB Neck ROM: Full    Dental  (+) Poor Dentition   Pulmonary neg sleep apnea, neg COPD,    breath sounds clear to auscultation- rhonchi (-) wheezing      Cardiovascular (-) hypertension(-) CAD, (-) Past MI, (-) Cardiac Stents and (-) CABG  Rhythm:Regular Rate:Normal - Systolic murmurs and - Diastolic murmurs    Neuro/Psych  Headaches, PSYCHIATRIC DISORDERS Depression    GI/Hepatic negative GI ROS, Neg liver ROS,   Endo/Other  neg diabetes  Renal/GU Renal disease: hx of nephrolithiasis.     Musculoskeletal negative musculoskeletal ROS (+)   Abdominal (+) + obese,   Peds  Hematology negative hematology ROS (+)   Anesthesia Other Findings Past Medical History: No date: Bradycardia No date: Depression No date: Diabetes mellitus without complication (HCC)     Comment:  resolved with weight loss from gastric bypass No date: Headache     Comment:  migraines. resolved since gastric bypass No date: History of kidney stones     Comment:  h/o No date: Hypertension     Comment:  resolved since gastric bypass, severe pre-eclampsia with              G1 2015: Meningitis No date: PCOS (polycystic ovarian syndrome) No date: Preterm labor No date: Sleep apnea     Comment:  had gastric bypass surgery and now resolved No date: Syncope   Reproductive/Obstetrics                             Anesthesia Physical Anesthesia Plan  ASA: II  Anesthesia Plan: General   Post-op Pain Management:    Induction: Intravenous  PONV Risk Score and Plan: 2 and Ondansetron, Dexamethasone and  Midazolam  Airway Management Planned: Oral ETT  Additional Equipment:   Intra-op Plan:   Post-operative Plan: Extubation in OR  Informed Consent: I have reviewed the patients History and Physical, chart, labs and discussed the procedure including the risks, benefits and alternatives for the proposed anesthesia with the patient or authorized representative who has indicated his/her understanding and acceptance.     Dental advisory given  Plan Discussed with: CRNA and Anesthesiologist  Anesthesia Plan Comments:         Anesthesia Quick Evaluation

## 2018-09-28 NOTE — Anesthesia Procedure Notes (Signed)
Procedure Name: Intubation Date/Time: 09/28/2018 8:29 AM Performed by: Allean Found, CRNA Pre-anesthesia Checklist: Patient identified, Patient being monitored, Timeout performed, Emergency Drugs available and Suction available Patient Re-evaluated:Patient Re-evaluated prior to induction Oxygen Delivery Method: Circle system utilized Preoxygenation: Pre-oxygenation with 100% oxygen Induction Type: IV induction Ventilation: Mask ventilation without difficulty Laryngoscope Size: Mac and 3 Grade View: Grade I Tube type: Oral Tube size: 7.0 mm Number of attempts: 1 Airway Equipment and Method: Stylet Placement Confirmation: ETT inserted through vocal cords under direct vision,  positive ETCO2 and breath sounds checked- equal and bilateral Secured at: 21 cm Tube secured with: Tape Dental Injury: Teeth and Oropharynx as per pre-operative assessment  Difficulty Due To: Difficult Airway- due to limited oral opening

## 2018-09-29 ENCOUNTER — Encounter: Payer: BC Managed Care – PPO | Admitting: Obstetrics & Gynecology

## 2018-09-29 LAB — SURGICAL PATHOLOGY

## 2018-10-05 ENCOUNTER — Encounter: Payer: Self-pay | Admitting: Obstetrics & Gynecology

## 2018-10-07 ENCOUNTER — Encounter: Payer: BC Managed Care – PPO | Admitting: Obstetrics & Gynecology

## 2018-10-08 ENCOUNTER — Other Ambulatory Visit: Payer: Self-pay

## 2018-10-08 ENCOUNTER — Encounter: Payer: Self-pay | Admitting: Obstetrics & Gynecology

## 2018-10-08 ENCOUNTER — Ambulatory Visit (INDEPENDENT_AMBULATORY_CARE_PROVIDER_SITE_OTHER): Payer: BC Managed Care – PPO | Admitting: Obstetrics & Gynecology

## 2018-10-08 VITALS — BP 120/80 | Ht 73.0 in | Wt 272.0 lb

## 2018-10-08 DIAGNOSIS — R1031 Right lower quadrant pain: Secondary | ICD-10-CM

## 2018-10-08 DIAGNOSIS — N83201 Unspecified ovarian cyst, right side: Secondary | ICD-10-CM

## 2018-10-08 NOTE — Progress Notes (Signed)
  Postoperative Follow-up Patient presents post op from right ovarian cystectomy for pain from cyst, 1 week ago. Path: DIAGNOSIS:  A. OVARIAN CYST, RIGHT; CYSTECTOMY:  - BENIGN RUPTURED OVARIAN CYST WITH LUTEINIZATION.  - A MALIGNANT NEOPLASTIC OR GRANULOMATOUS PROCESS IS NOT PRESENT.  Images   Subjective: Patient reports marked improvement in her preop symptoms. Eating a regular diet without difficulty. The patient is not having any pain.  Activity: normal activities of daily living. Patient reports additional symptom's since surgery of None.  Objective: BP 120/80   Ht 6\' 1"  (1.854 m)   Wt 272 lb (123.4 kg)   BMI 35.89 kg/m  Physical Exam Constitutional:      General: She is not in acute distress.    Appearance: She is well-developed.  Cardiovascular:     Rate and Rhythm: Normal rate.  Pulmonary:     Effort: Pulmonary effort is normal.  Abdominal:     General: There is no distension.     Palpations: Abdomen is soft.     Tenderness: There is no abdominal tenderness.     Comments: Incision Healing Well   Musculoskeletal: Normal range of motion.  Neurological:     Mental Status: She is alert and oriented to person, place, and time.     Cranial Nerves: No cranial nerve deficit.  Skin:    General: Skin is warm and dry.     Assessment: s/p :  right ovarian cystectomy progressing well  Plan: Patient has done well after surgery with no apparent complications.  I have discussed the post-operative course to date, and the expected progress moving forward.  The patient understands what complications to be concerned about.  I will see the patient in routine follow up, or sooner if needed.    Activity plan: No restriction.  Letitia Libra 10/08/2018, 8:45 AM

## 2018-10-11 ENCOUNTER — Other Ambulatory Visit: Payer: BC Managed Care – PPO

## 2019-01-21 ENCOUNTER — Other Ambulatory Visit: Payer: Self-pay

## 2019-01-21 ENCOUNTER — Emergency Department
Admission: EM | Admit: 2019-01-21 | Discharge: 2019-01-21 | Disposition: A | Discharge status: Home / Self Care | Payer: BC Managed Care – PPO | Attending: Emergency Medicine | Admitting: Emergency Medicine

## 2019-01-21 ENCOUNTER — Emergency Department: Payer: BC Managed Care – PPO

## 2019-01-21 DIAGNOSIS — E119 Type 2 diabetes mellitus without complications: Secondary | ICD-10-CM | POA: Diagnosis not present

## 2019-01-21 DIAGNOSIS — R1031 Right lower quadrant pain: Secondary | ICD-10-CM | POA: Diagnosis not present

## 2019-01-21 DIAGNOSIS — I10 Essential (primary) hypertension: Secondary | ICD-10-CM | POA: Diagnosis not present

## 2019-01-21 DIAGNOSIS — Z9884 Bariatric surgery status: Secondary | ICD-10-CM | POA: Insufficient documentation

## 2019-01-21 DIAGNOSIS — N200 Calculus of kidney: Secondary | ICD-10-CM | POA: Diagnosis not present

## 2019-01-21 DIAGNOSIS — R109 Unspecified abdominal pain: Secondary | ICD-10-CM | POA: Diagnosis present

## 2019-01-21 LAB — COMPREHENSIVE METABOLIC PANEL
ALT: 20 U/L (ref 0–44)
AST: 14 U/L — ABNORMAL LOW (ref 15–41)
Albumin: 4.3 g/dL (ref 3.5–5.0)
Alkaline Phosphatase: 80 U/L (ref 38–126)
Anion gap: 7 (ref 5–15)
BUN: 12 mg/dL (ref 6–20)
CO2: 22 mmol/L (ref 22–32)
Calcium: 8.5 mg/dL — ABNORMAL LOW (ref 8.9–10.3)
Chloride: 111 mmol/L (ref 98–111)
Creatinine, Ser: 0.59 mg/dL (ref 0.44–1.00)
GFR calc Af Amer: >60 mL/min (ref >60)
GFR calc non Af Amer: >60 mL/min (ref >60)
Glucose, Bld: 88 mg/dL (ref 70–99)
Potassium: 3.7 mmol/L (ref 3.5–5.1)
Sodium: 140 mmol/L (ref 135–145)
Total Bilirubin: 0.7 mg/dL (ref 0.3–1.2)
Total Protein: 6.8 g/dL (ref 6.5–8.1)

## 2019-01-21 LAB — URINALYSIS, COMPLETE (UACMP) WITH MICROSCOPIC
Bacteria, UA: NONE SEEN
Bilirubin Urine: NEGATIVE
Glucose, UA: NEGATIVE mg/dL
Ketones, ur: NEGATIVE mg/dL
Leukocytes,Ua: NEGATIVE
Nitrite: NEGATIVE
Protein, ur: NEGATIVE mg/dL
RBC / HPF: >50 RBC/hpf — ABNORMAL HIGH (ref 0–5)
Specific Gravity, Urine: 1.027 (ref 1.005–1.030)
pH: 5 (ref 5.0–8.0)

## 2019-01-21 LAB — CBC
HCT: 33.9 % — ABNORMAL LOW (ref 36.0–46.0)
Hemoglobin: 11.2 g/dL — ABNORMAL LOW (ref 12.0–15.0)
MCH: 30.5 pg (ref 26.0–34.0)
MCHC: 33 g/dL (ref 30.0–36.0)
MCV: 92.4 fL (ref 80.0–100.0)
Platelets: 324 10*3/uL (ref 150–400)
RBC: 3.67 MIL/uL — ABNORMAL LOW (ref 3.87–5.11)
RDW: 12.2 % (ref 11.5–15.5)
WBC: 8.5 10*3/uL (ref 4.0–10.5)
nRBC: 0 % (ref 0.0–0.2)

## 2019-01-21 LAB — PREGNANCY, URINE: Preg Test, Ur: NEGATIVE

## 2019-01-21 LAB — LIPASE, BLOOD: Lipase: 19 U/L (ref 11–51)

## 2019-01-21 MED ORDER — SODIUM CHLORIDE 0.9% FLUSH: 3.0000 mL | Freq: Once | INTRAVENOUS | Status: DC

## 2019-01-21 MED ORDER — OXYCODONE-ACETAMINOPHEN 5-325 MG PO TABS: 1.0000 | ORAL_TABLET | ORAL | 0 refills | Status: DC | PRN

## 2019-01-21 MED ORDER — MAGNESIUM CITRATE PO SOLN: 1.0000 | Freq: Once | ORAL | Status: DC

## 2019-01-21 MED ORDER — ONDANSETRON 4 MG PO TBDP: 4.0000 mg | ORAL_TABLET | Freq: Three times a day (TID) | ORAL | 0 refills | Status: DC | PRN

## 2019-01-21 MED ORDER — ONDANSETRON 4 MG PO TBDP
4.0000 mg | ORAL_TABLET | Freq: Once | ORAL | Status: AC
  Administered 2019-01-21: 4 mg via ORAL
  Filled 2019-01-21: qty 1

## 2019-01-21 MED ORDER — MAGNESIUM CITRATE PO SOLN: 1.0000 | Freq: Once | ORAL | 0 refills | Status: AC

## 2019-01-21 NOTE — ED Notes (Signed)
See triage note  States she developed some abd pain yesterday  States pain became worse today but is more localized in RLQ

## 2019-01-21 NOTE — ED Provider Notes (Signed)
[facility] Emergency Department Provider Note  ____________________________________________   First MD Initiated Contact with Patient 01/21/19 1856     (approximate)  I have reviewed the triage vital signs and the nursing notes.   HISTORY  Chief Complaint Abdominal Pain    HPI Maria Fuller is a 33 y.o. female presents emergency department complaint of right-sided abdominal pain.  States pain awoke her in the middle the night.  She states that not typical of her regular kidney stones.  She states it is worse.  Decreased appetite.  Some nausea.  No vomiting or diarrhea.  No fever chills.  No chest pain or shortness of breath.    Past Medical History:  Diagnosis Date  . Bradycardia   . Depression   . Diabetes mellitus without complication (HCC)    resolved with weight loss from gastric bypass  . Headache    migraines. resolved since gastric bypass  . History of kidney stones    h/o  . Hypertension    resolved since gastric bypass, severe pre-eclampsia with G1  . Meningitis 2015  . PCOS (polycystic ovarian syndrome)   . Preterm labor   . Sleep apnea    had gastric bypass surgery and now resolved  . Syncope     Patient Active Problem List   Diagnosis Date Noted  . Right ovarian cyst 09/09/2018  . Secondary oligomenorrhea 05/25/2018  . Pelvic pain 05/25/2018  . PCOS (polycystic ovarian syndrome) 05/25/2018  . Menometrorrhagia 05/25/2018  . RLQ abdominal pain 02/09/2018  . Hydronephrosis 09/22/2016  . Nephrolithiasis 09/22/2016  . Left flank pain 12/11/2015  . Right flank pain 10/06/2015    Past Surgical History:  Procedure Laterality Date  . CESAREAN SECTION    . CESAREAN SECTION N/A 05/07/2016   Procedure: CESAREAN SECTION;  Surgeon: Nadara Mustardobert P Harris, MD;  Location: [facility] ORS;  Service: Obstetrics;  Laterality: N/A;  Time of birth: 10:[birth year] Sex: Female Weight: 3940 kg, 8 lb 11 oz  . CHOLECYSTECTOMY    . CYSTOSCOPY WITH STENT PLACEMENT  Right 09/19/2016   Procedure: CYSTOSCOPY WITH STENT PLACEMENT;  Surgeon: Hildred LaserBudzyn, Brian James, MD;  Location: [facility] ORS;  Service: Urology;  Laterality: Right;  . GASTRIC BYPASS  04/2015  . LAPAROSCOPIC OVARIAN CYSTECTOMY Right 09/28/2018   Procedure: LAPAROSCOPIC RIGHT OVARIAN CYSTECTOMY;  Surgeon: Nadara MustardHarris, Robert P, MD;  Location: [facility] ORS;  Service: Gynecology;  Laterality: Right;  . LEFT HEART CATH AND CORONARY ANGIOGRAPHY Left 04/07/2018   Procedure: LEFT HEART CATH AND CORONARY ANGIOGRAPHY;  Surgeon: Alwyn Peaallwood, Dwayne D, MD;  Location: [facility] INVASIVE CV LAB;  Service: Cardiovascular;  Laterality: Left;  . TONSILLECTOMY    . URETEROSCOPY WITH HOLMIUM LASER LITHOTRIPSY Right 09/19/2016   Procedure: URETEROSCOPY WITH HOLMIUM LASER LITHOTRIPSY;  Surgeon: Hildred LaserBudzyn, Brian James, MD;  Location: [facility] ORS;  Service: Urology;  Laterality: Right;    Prior to Admission medications   Medication Sig Start Date End Date Taking? Authorizing Provider  levonorgestrel (MIRENA) 20 MCG/24HR IUD 1 each by Intrauterine route once.    [provider]  magnesium citrate SOLN Take 296 mLs (1 Bottle total) by mouth once for 1 dose. 01/21/19 01/21/19  Filippa Yarbough, Roselyn BeringSusan W, PA-C  ondansetron (ZOFRAN-ODT) 4 MG disintegrating tablet Take 1 tablet (4 mg total) by mouth every 8 (eight) hours as needed. 01/21/19   Onnie Hatchel, Roselyn BeringSusan W, PA-C  oxyCODONE-acetaminophen (PERCOCET) 5-325 MG tablet Take 1 tablet by mouth every 4 (four) hours as needed for severe pain. 01/21/19 01/21/20  Perlita Forbush,  Linden Dolin, PA-C  sertraline (ZOLOFT) 100 MG tablet Take 200 mg by mouth at bedtime.     [provider]  traZODone (DESYREL) 50 MG tablet Take 50 mg by mouth at bedtime.  09/06/18 09/06/19  [provider]    Allergies Ceftin [cefuroxime axetil] and Penicillins  Family History  Problem Relation Age of Onset  . Polycystic ovary syndrome Mother   . Diabetes Father   . Endometriosis Sister   . Polycystic ovary syndrome Sister   . Breast  cancer Paternal Grandmother   . Diabetes Paternal Grandmother   . Diabetes Paternal Grandfather   . Kidney cancer Neg Hx   . Prostate cancer Neg Hx   . Bladder Cancer Neg Hx     Social History Social History   Tobacco Use  . Smoking status: Never Smoker  . Smokeless tobacco: Never Used  Substance Use Topics  . Alcohol use: No  . Drug use: No    Review of Systems  Constitutional: No fever/chills Eyes: No visual changes. ENT: No sore throat. Respiratory: Denies cough Gastrointestinal: Positive for right lower quadrant pain Genitourinary: Negative for dysuria. Musculoskeletal: Negative for back pain. Skin: Negative for rash.    ____________________________________________   PHYSICAL EXAM:  VITAL SIGNS: ED Triage Vitals [01/21/19 1745]  Enc Vitals Group     BP (!) 144/86     Pulse Rate (!) 58     Resp 18     Temp 98.9 F (37.2 C)     Temp src      SpO2 99 %     Weight 270 lb (122.5 kg)     Height 6\' 1"  (1.854 m)     Head Circumference      Peak Flow      Pain Score 6     Pain Loc      Pain Edu?      Excl. in [place]?     Constitutional: Alert and oriented. Well appearing and in no acute distress. Eyes: Conjunctivae are normal.  Head: Atraumatic. Nose: No congestion/rhinnorhea. Mouth/Throat: Mucous membranes are moist.   Neck:  supple no lymphadenopathy noted Cardiovascular: Normal rate, regular rhythm. Heart sounds are normal Respiratory: Normal respiratory effort.  No retractions, lungs c t a  Abd: soft tender in right lower quadrant, bs normal all 4 quad GU: deferred Musculoskeletal: FROM all extremities, warm and well perfused Neurologic:  Normal speech and language.  Skin:  Skin is warm, dry and intact. No rash noted. Psychiatric: Mood and affect are normal. Speech and behavior are normal.  ____________________________________________   LABS (all labs ordered are listed, but only abnormal results are displayed)  Labs Reviewed  COMPREHENSIVE  METABOLIC PANEL - Abnormal; Notable for the following components:      Result Value   Calcium 8.5 (*)    AST 14 (*)    All other components within normal limits  CBC - Abnormal; Notable for the following components:   RBC 3.67 (*)    Hemoglobin 11.2 (*)    HCT 33.9 (*)    All other components within normal limits  URINALYSIS, COMPLETE (UACMP) WITH MICROSCOPIC - Abnormal; Notable for the following components:   Color, Urine YELLOW (*)    APPearance HAZY (*)    Hgb urine dipstick MODERATE (*)    RBC / HPF >50 (*)    All other components within normal limits  LIPASE, BLOOD  PREGNANCY, URINE   ____________________________________________   ____________________________________________  RADIOLOGY  CT renal stone shows  a 10 mm renal stone on the left side, large amount of stool at the anastomotic suture line from her bypass surgery.  Discussed this with the radiologist.  He states the CT is very similar and the measurements are the exact same as they were in 2019.  He states nothing is obstructing.  ____________________________________________   PROCEDURES  Procedure(s) performed: Zofran ODT 4 mg    Procedures    ____________________________________________   INITIAL IMPRESSION / ASSESSMENT AND PLAN / ED COURSE  Pertinent labs & imaging results that were available during my care of the patient were reviewed by me and considered in my medical decision making (see chart for details).   Patient is 33 year old female presents emergency department complaint of right lower quadrant pain.  See HPI  Physical exam shows patient be tender in the right lower quadrant.  Remainder of exam is unremarkable  CBC is normal, urinalysis has moderate hemoglobin and greater than 50 RBCs, compress metabolic panel is basically normal,  CT renal stone shows 10 mm renal stone on the left side that is nonobstructing.  The radiologist also read it as having a large amount of fecal material at the  anastomic suture line from her previous bypass surgery.  I did call him and discussed this with him.  He states that the area is the same as it was in 2019.  He does not see an obstruction.  Explained all of this to the patient.  We spoke in great length about her bypass surgery and complications that could arise from this.  At this time she does not want to do the mag citrate here in the ED she would rather do it at home and the comforter of her own bathroom.  I told her understand and this would be appropriate at this time she does not have a blockage.  However she was given strict instructions to return to the emergency department if worsening.  She is to follow-up with her urologist for evaluation of the kidney stone.  She was discharged in stable condition.  She was also given a referral to a urologist for the 10 mm kidney stone.  She was given a prescription for Percocet as needed for kidney stone pain.    Willamena Khalifa [place] was evaluated in Emergency Department on 01/21/2019 for the symptoms described in the history of present illness. She was evaluated in the context of the global COVID-19 pandemic, which necessitated consideration that the patient might be at risk for infection with the SARS-CoV-2 virus that causes COVID-19. Institutional protocols and algorithms that pertain to the evaluation of patients at risk for COVID-19 are in a state of rapid change based on information released by regulatory bodies including the CDC and federal and state organizations. These policies and algorithms were followed during the patient's care in the ED.   As part of my medical decision making, I reviewed the following data within the electronic MEDICAL RECORD NUMBER Nursing notes reviewed and incorporated, Labs reviewed see above, Old chart reviewed, Radiograph reviewed see above, Discussed with radiologist, Notes from prior ED visits and [place] Controlled Substance Database  ____________________________________________    FINAL CLINICAL IMPRESSION(S) / ED DIAGNOSES  Final diagnoses:  Right lower quadrant abdominal pain  Kidney stone      NEW MEDICATIONS STARTED DURING THIS VISIT:  New Prescriptions   MAGNESIUM CITRATE SOLN    Take 296 mLs (1 Bottle total) by mouth once for 1 dose.   ONDANSETRON (ZOFRAN-ODT) 4 MG DISINTEGRATING TABLET  Take 1 tablet (4 mg total) by mouth every 8 (eight) hours as needed.   OXYCODONE-ACETAMINOPHEN (PERCOCET) 5-325 MG TABLET    Take 1 tablet by mouth every 4 (four) hours as needed for severe pain.     Note:  This document was prepared using Dragon voice recognition software and may include unintentional dictation errors.    Faythe GheeFisher, Dalma Panchal W, PA-C 01/21/19 2132    Concha SeFunke, Mary E, MD 01/24/19 [phone]

## 2019-01-21 NOTE — Discharge Instructions (Addendum)
Follow-up with your regular doctor if not better in 3 days.  Return emergency department worsening.  Tried taking the mag citrate to help you have a larger bowel movement.  If you have increasing abdominal pain please return to the emergency department.

## 2019-01-21 NOTE — ED Triage Notes (Signed)
Pt comes via POV from home with c/o right sided pain. Pt states it was hurting in her abdomen but has since moved to right side. Pt states it woke up her from her sleep last night.  Pt states N/D. Pt also states hx of kidney stones . Pt denies any urinary symptoms.

## 2019-01-26 ENCOUNTER — Other Ambulatory Visit
Admission: RE | Admit: 2019-01-26 | Discharge: 2019-01-26 | Disposition: A | Discharge status: Home / Self Care | Payer: BC Managed Care – PPO | Source: Ambulatory Visit | Attending: Gastroenterology | Admitting: Gastroenterology

## 2019-01-26 DIAGNOSIS — R194 Change in bowel habit: Secondary | ICD-10-CM | POA: Insufficient documentation

## 2019-01-26 DIAGNOSIS — R197 Diarrhea, unspecified: Secondary | ICD-10-CM | POA: Insufficient documentation

## 2019-01-26 DIAGNOSIS — R1032 Left lower quadrant pain: Secondary | ICD-10-CM | POA: Diagnosis present

## 2019-01-26 DIAGNOSIS — R1031 Right lower quadrant pain: Secondary | ICD-10-CM | POA: Insufficient documentation

## 2019-01-26 LAB — C DIFFICILE QUICK SCREEN W PCR REFLEX
C Diff antigen: NEGATIVE
C Diff interpretation: NOT DETECTED
C Diff toxin: NEGATIVE

## 2019-01-31 LAB — GI PATHOGEN PANEL BY PCR, STOOL

## 2019-02-07 ENCOUNTER — Ambulatory Visit: Payer: BC Managed Care – PPO | Admitting: Urology

## 2019-02-10 ENCOUNTER — Ambulatory Visit: Payer: BC Managed Care – PPO | Admitting: Urology

## 2019-02-10 ENCOUNTER — Encounter: Payer: Self-pay | Admitting: Urology

## 2019-02-11 ENCOUNTER — Telehealth: Payer: Self-pay | Admitting: Urology

## 2019-02-11 NOTE — Telephone Encounter (Signed)
LMOM, also no-show letter was sent.

## 2019-02-11 NOTE — Telephone Encounter (Signed)
-----   Message from Billey Co, MD sent at 02/10/2019  6:05 PM EDT ----- Regarding: follow up Patient no showed today, she has a large kidney stone and recurrent stones, please re-schedule her with me in the next few weeks   Nickolas Madrid, MD 02/10/2019

## 2019-02-15 ENCOUNTER — Other Ambulatory Visit: Payer: Self-pay | Admitting: Obstetrics & Gynecology

## 2019-02-15 ENCOUNTER — Encounter: Payer: Self-pay | Admitting: Obstetrics & Gynecology

## 2019-02-15 DIAGNOSIS — N83201 Unspecified ovarian cyst, right side: Secondary | ICD-10-CM

## 2019-02-15 DIAGNOSIS — R102 Pelvic and perineal pain: Secondary | ICD-10-CM

## 2019-02-15 NOTE — Telephone Encounter (Signed)
Patient is schedule 02/24/19

## 2019-02-15 NOTE — Telephone Encounter (Signed)
Sch GYN [place] then f/u [place]

## 2019-02-15 NOTE — Telephone Encounter (Signed)
Please advise 

## 2019-02-24 ENCOUNTER — Ambulatory Visit (INDEPENDENT_AMBULATORY_CARE_PROVIDER_SITE_OTHER): Payer: BC Managed Care – PPO

## 2019-02-24 ENCOUNTER — Ambulatory Visit (INDEPENDENT_AMBULATORY_CARE_PROVIDER_SITE_OTHER): Payer: BC Managed Care – PPO | Admitting: Obstetrics & Gynecology

## 2019-02-24 ENCOUNTER — Other Ambulatory Visit: Payer: Self-pay

## 2019-02-24 ENCOUNTER — Encounter: Payer: Self-pay | Admitting: Obstetrics & Gynecology

## 2019-02-24 VITALS — BP 140/90 | Ht 73.0 in | Wt 270.0 lb

## 2019-02-24 DIAGNOSIS — R102 Pelvic and perineal pain: Secondary | ICD-10-CM

## 2019-02-24 DIAGNOSIS — N83201 Unspecified ovarian cyst, right side: Secondary | ICD-10-CM | POA: Diagnosis not present

## 2019-02-24 NOTE — Progress Notes (Signed)
HPI: Pt has been having RLQ pain; seen in ER for pain and CT as concern was for appendicitis; this was neg and there were some changes related to prior gastric bypass surgery that she has since seen GI for and cleared from any level of concerns (these findings were upper GI); she is due fo upper GI and colonoscopy in the near future; also noted on CT then to have LEFT sided kidney stone (she has no pain on left); sjhe does have prior h/o right ovarian cyst w surgery last summer for this.  Pain also occurs w sex.  No radiaiton or modifiers or other context.  Ultrasound demonstrates no masses seen, no cysts These findings are Pelvis normal  PMHx: She  has a past medical history of Bradycardia, Depression, Diabetes mellitus without complication ([place]), Headache, History of kidney stones, Hypertension, Meningitis (2015), PCOS (polycystic ovarian syndrome), Preterm labor, Sleep apnea, and Syncope. Also,  has a past surgical history that includes Cholecystectomy; Cesarean section; Tonsillectomy; Gastric bypass (04/2015); Cesarean section (N/A, 05/07/2016); Ureteroscopy with holmium laser lithotripsy (Right, 09/19/2016); Cystoscopy with stent placement (Right, 09/19/2016); LEFT HEART CATH AND CORONARY ANGIOGRAPHY (Left, 04/07/2018); and Laparoscopic ovarian cystectomy (Right, 09/28/2018)., family history includes Breast cancer in her paternal grandmother; Diabetes in her father, paternal grandfather, and paternal grandmother; Endometriosis in her sister; Polycystic ovary syndrome in her mother and sister.,  reports that she has never smoked. She has never used smokeless tobacco. She reports that she does not drink alcohol or use drugs.  She has a current medication list which includes the following prescription(s): levonorgestrel, sertraline, and trazodone. Also, is allergic to ceftin [cefuroxime axetil] and penicillins.  Review of Systems  All other systems reviewed and are negative.   Objective: BP 140/90    Ht 6\' 1"  (1.854 m)   Wt 270 lb (122.5 kg)   BMI 35.62 kg/m   Physical examination Constitutional NAD, Conversant  Skin No rashes, lesions or ulceration.   Extremities: Moves all appropriately.  Normal ROM for age. No lymphadenopathy.  Neuro: Grossly intact  Psych: Oriented to PPT.  Normal mood. Normal affect.   US Pelvis Transvaginal Non-ob (tv Only)  Result Date: 02/24/2019 Patient Name: Maria Fuller DOB: 03-03-1986 MRN: [id number] ULTRASOUND REPORT Location: [facility] Date of Service: 02/24/2019 Indications: Pelvic Pain Findings: The uterus is anteverted and measures 7.1 x 5.0 x 4.5 cm. Echo texture is homogenous without evidence of focal masses. The Endometrium measures 2.3 mm. The IUD is correctly placed within the uterus. Right Ovary measures 4.6 x 2.7 x 2.0 cm. It is normal in appearance. Left Ovary measures 4.1 x 2.7 x 1.9 cm. It is normal in appearance. Survey of the adnexa demonstrates no adnexal masses. There is no free fluid in the cul de sac. Impression: 1. Normal pelvic ultrasound. 2. The IUD is correctly located within the uterus. Recommendations: 1.Clinical correlation with the patient's History and Physical Exam. Gweneth Dimitri, RT Review of ULTRASOUND.    I have personally reviewed images and report of recent ultrasound done at [facility].    Plan of management to be discussed with patient. Barnett Applebaum, MD, [facility], [facility] 02/24/2019  4:36 PM   Assessment:  History of Right ovarian cyst Pelvic pain and Dyspareunia Normal [place] today Consider GU or GI etiology moving forward  A total of 15 minutes were spent face-to-face with the patient during this encounter and over half of that time dealt with counseling and coordination of care.  Eddie Dibbles  Tiburcio Pea, MD, [facility], [facility] 02/24/2019  4:46 PM

## 2019-05-24 ENCOUNTER — Other Ambulatory Visit: Payer: Self-pay

## 2019-05-24 ENCOUNTER — Encounter: Payer: Self-pay | Admitting: Gastroenterology

## 2019-05-24 ENCOUNTER — Ambulatory Visit: Payer: BC Managed Care – PPO | Admitting: Gastroenterology

## 2019-05-24 VITALS — BP 137/85 | HR 65 | Temp 98.2°F | Resp 16 | Ht 73.0 in | Wt 268.6 lb

## 2019-05-24 DIAGNOSIS — K529 Noninfective gastroenteritis and colitis, unspecified: Secondary | ICD-10-CM

## 2019-05-24 DIAGNOSIS — K601 Chronic anal fissure: Secondary | ICD-10-CM

## 2019-05-24 DIAGNOSIS — Z9884 Bariatric surgery status: Secondary | ICD-10-CM

## 2019-05-24 DIAGNOSIS — D649 Anemia, unspecified: Secondary | ICD-10-CM

## 2019-05-24 MED ORDER — COLESTIPOL HCL 1 G PO TABS: 4.0000 g | ORAL_TABLET | Freq: Two times a day (BID) | ORAL | 1 refills | Status: DC

## 2019-05-24 NOTE — Progress Notes (Signed)
Cephas Darby, MD [address]  [place]  [place], [place] [address]  Main: [phone]  Fax: [phone]    Gastroenterology Consultation  Referring Provider:     Nelwyn Salisbury, PA-C Primary Care Physician:  Nelwyn Salisbury, PA-C Primary Gastroenterologist:  Dr. Cephas Darby Reason for Consultation: Lower abdominal pain, rectal pain        HPI:   Maria Fuller is a 34 y.o. female referred by Dr. Nelwyn Salisbury, PA-C  for consultation & management of abdominal pain.  Patient has history of duodenal switch by Dr. Darnell Level in [place], [place] 2016.  She also had history of cholecystectomy at the age of 51 and since then has been experiencing nonbloody diarrhea up to 5 times a day. Patient in the ER in September with right lower quadrant pain, nausea vomiting, underwent CT protocol, nonobstructing left renal calculus.  She was then evaluated by her OB/GYN, underwent pelvic ultrasound which was normal and IUD was in place.  Labs, CMP.  Stool studies negative for C. difficile and other GI pathogens in September.  Urine pregnancy test negative, UA revealed hematuria  Patient reports that since the end of November 2020, she has been experiencing severe rectal pain associated with bright red blood per rectum on wiping.  She denies constipation, patient was straining.  She was initially experiencing rectal pain with bowel movement, it has progressed, more or less constant throughout the day and worse with bowel movement, describes it as sharp pain.  She has tried hydrocortisone suppository, lidocaine cream.  Patient lost about 130 pounds since her gastric bypass.  Does not take bariatric multivitamins.  Review of her labs revealed mild normocytic anemia.  She sees her bariatric surgeon about once a year.  She had history of hypothyroidism, currently not on thyroid replacement.  Reports feeling tired throughout the day.  She stays active, weight has been stable, works as an Youth worker for pre-k.  Has a 52-year-old and a 15-year-old boys She does not smoke or drink alcohol  NSAIDs: None  Antiplts/Anticoagulants/Anti thrombotics: None  GI Procedures: Colonoscopy several years ago, found to have adenomatous polyp She denies family history of GI malignancy  Past Medical History:  Diagnosis Date  . Bradycardia   . Depression   . Diabetes mellitus without complication ([place])    resolved with weight loss from gastric bypass  . Headache    migraines. resolved since gastric bypass  . History of kidney stones    h/o  . Hypertension    resolved since gastric bypass, severe pre-eclampsia with G1  . Meningitis 2015  . PCOS (polycystic ovarian syndrome)   . Preterm labor   . Sleep apnea    had gastric bypass surgery and now resolved  . Syncope     Past Surgical History:  Procedure Laterality Date  . CESAREAN SECTION    . CESAREAN SECTION N/A 05/07/2016   Procedure: CESAREAN SECTION;  Surgeon: Gae Dry, MD;  Location: [facility] ORS;  Service: Obstetrics;  Laterality: N/A;  Time of birth: 10:16 Sex: Female Weight: 3940 kg, 8 lb 11 oz  . CHOLECYSTECTOMY    . CYSTOSCOPY WITH STENT PLACEMENT Right 09/19/2016   Procedure: CYSTOSCOPY WITH STENT PLACEMENT;  Surgeon: Nickie Retort, MD;  Location: [facility] ORS;  Service: Urology;  Laterality: Right;  . GASTRIC BYPASS  04/2015  . LAPAROSCOPIC OVARIAN CYSTECTOMY Right 09/28/2018   Procedure: LAPAROSCOPIC RIGHT OVARIAN CYSTECTOMY;  Surgeon: Gae Dry, MD;  Location: [facility] ORS;  Service: Gynecology;  Laterality: Right;  . LEFT HEART CATH AND CORONARY ANGIOGRAPHY Left 04/07/2018   Procedure: LEFT HEART CATH AND CORONARY ANGIOGRAPHY;  Surgeon: Alwyn Pea, MD;  Location: [facility] INVASIVE CV LAB;  Service: Cardiovascular;  Laterality: Left;  . TONSILLECTOMY    . URETEROSCOPY WITH HOLMIUM LASER LITHOTRIPSY Right 09/19/2016   Procedure: URETEROSCOPY WITH HOLMIUM LASER LITHOTRIPSY;  Surgeon: Hildred Laser, MD;   Location: [facility] ORS;  Service: Urology;  Laterality: Right;    Current Outpatient Medications:  .  glucose blood (ACCU-CHEK AVIVA PLUS) test strip, Use As Directed, Disp: , Rfl:  .  levonorgestrel (MIRENA) 20 MCG/24HR IUD, 1 each by Intrauterine route once., Disp: , Rfl:  .  nitrofurantoin, macrocrystal-monohydrate, (MACROBID) 100 MG capsule, nitrofurantoin monohydrate/macrocrystals 100 mg capsule, Disp: , Rfl:  .  omeprazole (PRILOSEC) 20 MG capsule, omeprazole 20 mg capsule,delayed release, Disp: , Rfl:  .  sertraline (ZOLOFT) 100 MG tablet, Take 200 mg by mouth at bedtime. , Disp: , Rfl:  .  traZODone (DESYREL) 50 MG tablet, Take 50 mg by mouth at bedtime. , Disp: , Rfl:  .  colestipol (COLESTID) 1 g tablet, Take 4 tablets (4 g total) by mouth 2 (two) times daily., Disp: 240 tablet, Rfl: 1    Family History  Problem Relation Age of Onset  . Polycystic ovary syndrome Mother   . Diabetes Father   . Endometriosis Sister   . Polycystic ovary syndrome Sister   . Breast cancer Paternal Grandmother   . Diabetes Paternal Grandmother   . Diabetes Paternal Grandfather   . Kidney cancer Neg Hx   . Prostate cancer Neg Hx   . Bladder Cancer Neg Hx      Social History   Tobacco Use  . Smoking status: Never Smoker  . Smokeless tobacco: Never Used  Substance Use Topics  . Alcohol use: No  . Drug use: No    Allergies as of 05/24/2019 - Review Complete 05/24/2019  Allergen Reaction Noted  . Ceftin [cefuroxime axetil] Rash 03/03/2018  . Penicillins Rash 06/26/2014    Review of Systems:    All systems reviewed and negative except where noted in HPI.   Physical Exam:  BP 137/85 (BP Location: Left Arm, Patient Position: Sitting, Cuff Size: Large)   Pulse 65   Temp 98.2 F (36.8 C)   Resp 16   Ht 6\' 1"  (1.854 m)   Wt 268 lb 9.6 oz (121.8 kg)   BMI 35.44 kg/m  No LMP recorded. (Menstrual status: IUD).  General:   Alert,  Well-developed, well-nourished, pleasant and cooperative in  NAD Head:  Normocephalic and atraumatic. Eyes:  Sclera clear, no icterus.   Conjunctiva pink. Ears:  Normal auditory acuity. Nose:  No deformity, discharge, or lesions. Mouth:  No deformity or lesions,oropharynx pink & moist. Neck:  Supple; no masses or thyromegaly. Lungs:  Respirations even and unlabored.  Clear throughout to auscultation.   No wheezes, crackles, or rhonchi. No acute distress. Heart:  Regular rate and rhythm; no murmurs, clicks, rubs, or gallops. Abdomen:  Normal bowel sounds. Soft, non-tender and non-distended without masses, hepatosplenomegaly or hernias noted.  No guarding or rebound tenderness.   Rectal: Severe tenderness in the posterior wall of anal canal consistent with posterior anal fissure.  Normal perianal skin Msk:  Symmetrical without gross deformities. Good, equal movement & strength bilaterally. Pulses:  Normal pulses noted. Extremities:  No clubbing or edema.  No cyanosis. Neurologic:  Alert and oriented x3;  grossly normal  neurologically. Skin:  Intact without significant lesions or rashes. No jaundice. Psych:  Alert and cooperative. Normal mood and affect.  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, s/p laparoscopic cholecystectomy at age of 42, chronic nonbloody diarrhea since then, seen in consultation for 2 months history of severe rectal pain/discomfort  Rectal pain Rectal exam consistent with procedure anal fissure Recommend 0.125% nitroglycerin with 5% lidocaine, instructions provided  Rectal bleeding and history of tubular adenoma: Recommend colonoscopy after healing of the anal fissure in about 4 to 6 weeks  Chronic diarrhea since cholecystectomy, likely due to combination of bile salt diarrhea and duodenal switch We will try bile acid sequestrant  History of gastric bypass status post duodenal switch We will check for micronutrient deficiencies Advised to take  bariatric multivitamins with minerals check iron studies, B12 and folate panel, serum copper, selenium, manganese, zinc, thiamine, TSH   Follow up in 4 to 6 weeks   Arlyss Repress, MD

## 2019-05-25 ENCOUNTER — Other Ambulatory Visit: Payer: Self-pay | Admitting: Gastroenterology

## 2019-05-25 ENCOUNTER — Encounter: Payer: Self-pay | Admitting: Gastroenterology

## 2019-05-25 DIAGNOSIS — D518 Other vitamin B12 deficiency anemias: Secondary | ICD-10-CM

## 2019-05-25 MED ORDER — CYANOCOBALAMIN 1000 MCG/ML IJ SOLN: 1000.0000 ug | INTRAMUSCULAR | 0 refills | Status: AC

## 2019-05-28 LAB — COPPER, SERUM: Copper: 30 ug/dL — ABNORMAL LOW (ref 80–158)

## 2019-05-28 LAB — VITAMIN B1: Thiamine: 109.7 nmol/L (ref 66.5–200.0)

## 2019-05-28 LAB — IRON AND TIBC
Iron Saturation: 28 % (ref 15–55)
Iron: 68 ug/dL (ref 27–159)
Total Iron Binding Capacity: 246 ug/dL — ABNORMAL LOW (ref 250–450)
UIBC: 178 ug/dL (ref 131–425)

## 2019-05-28 LAB — SELENIUM SERUM: Selenium, S/P: 135 ug/L (ref 93–198)

## 2019-05-28 LAB — B12 AND FOLATE PANEL
Folate: 7 ng/mL (ref >3.0)
Vitamin B-12: 207 pg/mL — ABNORMAL LOW (ref 232–1245)

## 2019-05-28 LAB — TSH: TSH: 2.75 u[IU]/mL (ref 0.450–4.500)

## 2019-05-28 LAB — ZINC: Zinc: 71 ug/dL (ref 44–115)

## 2019-05-28 LAB — MANGANESE, WHOLE BLOOD: Manganese, Blood: 13.2 ug/L (ref 8.0–18.7)

## 2019-05-28 LAB — FERRITIN: Ferritin: 215 ng/mL — ABNORMAL HIGH (ref 15–150)

## 2019-06-01 ENCOUNTER — Other Ambulatory Visit: Payer: Self-pay

## 2019-06-01 DIAGNOSIS — K760 Fatty (change of) liver, not elsewhere classified: Secondary | ICD-10-CM

## 2019-06-08 ENCOUNTER — Encounter: Payer: Self-pay | Admitting: Gastroenterology

## 2019-06-08 ENCOUNTER — Other Ambulatory Visit: Payer: Self-pay | Admitting: Gastroenterology

## 2019-06-08 DIAGNOSIS — K602 Anal fissure, unspecified: Secondary | ICD-10-CM

## 2019-06-08 MED ORDER — NITROGLYCERIN 0.4 % RE OINT: 1.0000 "application " | TOPICAL_OINTMENT | Freq: Three times a day (TID) | RECTAL | 0 refills | Status: DC

## 2019-06-10 ENCOUNTER — Ambulatory Visit: Payer: BC Managed Care – PPO

## 2019-06-15 ENCOUNTER — Other Ambulatory Visit: Payer: Self-pay

## 2019-06-15 ENCOUNTER — Ambulatory Visit
Admission: RE | Admit: 2019-06-15 | Discharge: 2019-06-15 | Disposition: A | Discharge status: Home / Self Care | Payer: BC Managed Care – PPO | Source: Ambulatory Visit | Attending: Gastroenterology | Admitting: Gastroenterology

## 2019-06-15 DIAGNOSIS — K760 Fatty (change of) liver, not elsewhere classified: Secondary | ICD-10-CM | POA: Insufficient documentation

## 2019-06-22 ENCOUNTER — Ambulatory Visit: Payer: BC Managed Care – PPO | Admitting: Gastroenterology

## 2019-06-30 ENCOUNTER — Encounter: Payer: Self-pay | Admitting: Gastroenterology

## 2019-07-01 ENCOUNTER — Telehealth: Payer: Self-pay

## 2019-07-01 ENCOUNTER — Other Ambulatory Visit: Payer: Self-pay | Admitting: Gastroenterology

## 2019-07-01 NOTE — Telephone Encounter (Signed)
-----   Message from Toney Reil, MD sent at 07/01/2019 10:58 AM EST ----- Regarding: Re: Anal fissure Please send in prescription as below  Topical nifedipine 0.3% with 5% lidocaine - typically used two to four times daily   Inform pt  Thanks  RV

## 2019-07-01 NOTE — Telephone Encounter (Signed)
Faxed this to [facility]. Called and left a detail message for patient. Sent patient a Clinical cytogeneticist message also informing patient

## 2019-07-04 HISTORY — PX: OTHER SURGICAL HISTORY: SHX169

## 2019-07-11 ENCOUNTER — Encounter: Payer: Self-pay | Admitting: Gastroenterology

## 2019-07-11 ENCOUNTER — Other Ambulatory Visit: Payer: Self-pay

## 2019-07-11 ENCOUNTER — Ambulatory Visit (INDEPENDENT_AMBULATORY_CARE_PROVIDER_SITE_OTHER): Payer: BC Managed Care – PPO | Admitting: Gastroenterology

## 2019-07-11 DIAGNOSIS — K602 Anal fissure, unspecified: Secondary | ICD-10-CM | POA: Diagnosis not present

## 2019-07-11 DIAGNOSIS — K625 Hemorrhage of anus and rectum: Secondary | ICD-10-CM | POA: Diagnosis not present

## 2019-07-11 NOTE — Progress Notes (Signed)
Maria Sear, MD [address]  [place]  [place], [place] [address]  Main: [phone]  Fax: [phone]    Gastroenterology Consultation Video Visit  Referring Provider:     Nelwyn Salisbury, PA-C Primary Care Physician:  Nelwyn Salisbury, PA-C Primary Gastroenterologist:  Dr. Cephas Darby Reason for Consultation:   Rectal pain, rectal bleeding, diarrhea        HPI:   Maria Fuller is a 34 y.o. female referred by Dr. Nelwyn Salisbury, PA-C  for consultation & management of lower abdominal pain, rectal pain and rectal bleeding  Virtual Visit Video Note  I connected with Maria Fuller on 07/11/19 at  3:15 PM EST by video and verified that I am speaking with the correct person using two identifiers.   I discussed the limitations, risks, security and privacy concerns of performing an evaluation and management service by video and the availability of in person appointments. I also discussed with the patient that there may be a patient responsible charge related to this service. The patient expressed understanding and agreed to proceed.  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  She also had history of cholecystectomy at the age of 76 and since then has been experiencing nonbloody diarrhea up to 5 times a day.  Patient has been experiencing severe rectal pain associated with bright red blood per rectum on wiping since November 2020 , pain persisted after trial of hydrocortisone suppository.  I will be treating her for anal fissure.  She did not tolerate 0.125% nitroglycerin due to headaches.  I switched to 0.3% topical nifedipine with lidocaine.  Patient reports that the rectal pain is still persistent.  She is no longer seeing better blood per rectum.  She is using colestipol and  her bowel movements are now 3-4 times a day.  Rectal pain is interfering with her work, unable to sit down in a group  NSAIDs: None  Antiplts/Anticoagulants/Anti thrombotics: None  GI Procedures: Colonoscopy several years ago, found to have adenomatous polyp She denies family history of GI malignancy  Past Medical History:  Diagnosis Date  . Bradycardia   . Depression   . Diabetes mellitus without complication ([place])    resolved with weight loss from gastric bypass  . Headache    migraines. resolved since gastric bypass  . History of kidney stones    h/o  . Hypertension    resolved since gastric bypass, severe pre-eclampsia with G1  . Meningitis 2015  . PCOS (polycystic ovarian syndrome)   . Preterm labor   . Sleep apnea    had gastric bypass surgery and now resolved  . Syncope     Past Surgical History:  Procedure Laterality Date  . CESAREAN SECTION    . CESAREAN SECTION N/A 05/07/2016   Procedure: CESAREAN SECTION;  Surgeon: Gae Dry, MD;  Location: [facility];  Service: Obstetrics;  Laterality: N/A;  Time of birth: 10:[birth year] Sex: Female Weight: 3940 kg, 8 lb 11 oz  . CHOLECYSTECTOMY    . CYSTOSCOPY WITH STENT PLACEMENT Right 09/19/2016   Procedure: CYSTOSCOPY WITH STENT PLACEMENT;  Surgeon: Nickie Retort, MD;  Location: [facility];  Service: Urology;  Laterality: Right;  . GASTRIC BYPASS  04/2015  . LAPAROSCOPIC OVARIAN CYSTECTOMY Right 09/28/2018   Procedure: LAPAROSCOPIC  RIGHT OVARIAN CYSTECTOMY;  Surgeon: Nadara Mustard, MD;  Location: [facility];  Service: Gynecology;  Laterality: Right;  . LEFT HEART CATH AND CORONARY ANGIOGRAPHY Left 04/07/2018   Procedure: LEFT HEART CATH AND CORONARY ANGIOGRAPHY;  Surgeon: Alwyn Pea, MD;  Location: [facility];  Service: Cardiovascular;  Laterality: Left;  . TONSILLECTOMY    . URETEROSCOPY WITH HOLMIUM LASER LITHOTRIPSY Right 09/19/2016   Procedure: URETEROSCOPY WITH HOLMIUM LASER LITHOTRIPSY;  Surgeon: Hildred Laser, MD;  Location: [facility];  Service: Urology;  Laterality: Right;    Current Outpatient Medications:  .  cyanocobalamin (,VITAMIN B-12,) 1000 MCG/ML injection, Inject 1 mL (1,000 mcg total) into the skin once a week for 10 doses. Weekly for 4 weeks then alternate week for another 4weeks then once every 4weeks, Disp: 10 mL, Rfl: 0 .  levonorgestrel (MIRENA) 20 MCG/24HR IUD, 1 each by Intrauterine route once., Disp: , Rfl:  .  Nitroglycerin 0.4 % OINT, Place 1 application rectally 3 (three) times daily., Disp: 30 g, Rfl: 0 .  sertraline (ZOLOFT) 100 MG tablet, Take 200 mg by mouth at bedtime. , Disp: , Rfl:  .  traZODone (DESYREL) 50 MG tablet, Take 50 mg by mouth at bedtime. , Disp: , Rfl:  .  colestipol (COLESTID) 1 g tablet, Take 4 tablets (4 g total) by mouth 2 (two) times daily., Disp: 240 tablet, Rfl: 1    Family History  Problem Relation Age of Onset  . Polycystic ovary syndrome Mother   . Diabetes Father   . Endometriosis Sister   . Polycystic ovary syndrome Sister   . Breast cancer Paternal Grandmother   . Diabetes Paternal Grandmother   . Diabetes Paternal Grandfather   . Kidney cancer Neg Hx   . Prostate cancer Neg Hx   . Bladder Cancer Neg Hx      Social History   Tobacco Use  . Smoking status: Never Smoker  . Smokeless tobacco: Never Used  Substance Use Topics  . Alcohol use: No  . Drug use: No    Allergies as of 07/11/2019 - Review Complete 07/11/2019  Allergen Reaction Noted  . Ceftin [cefuroxime axetil] Rash 03/03/2018  . Penicillins Rash 06/26/2014     Imaging Studies: Reviewed  Assessment and Plan:   [name] is a 34 y.o. female with history of obesity, duodenal switch in 2016, s/p laparoscopic cholecystectomy at age of 77, chronic nonbloody diarrhea post cholecystectomy, is connected via video visit for follow-up of rectal pain and bleeding  Rectal pain and bleeding Patient is not experiencing bright red blood per rectum She did  not tolerate low-dose topical nitroglycerin due to splitting headaches Currently on 0.3% nifedipine for probable posterior anal fissure Given the rectal pain is persistent, recommend flexible sigmoidoscopy for further evaluation Recommend sitz baths daily and to use donut cushion Refer to general surgery to evaluate for Botox injection if there is underlying fissure  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 The patient was provided an opportunity to ask questions and all were answered. The patient agreed with the plan and demonstrated an understanding of the instructions.   The patient was advised to call back or seek an in-person evaluation if the symptoms worsen or if the condition fails to improve as anticipated.  I  provided 10 minutes of face-to-face time during this encounter.   Follow up in 4 to 6 weeks   Arlyss Repress, MD

## 2019-07-12 ENCOUNTER — Emergency Department: Payer: BC Managed Care – PPO

## 2019-07-12 ENCOUNTER — Emergency Department
Admission: EM | Admit: 2019-07-12 | Discharge: 2019-07-12 | Disposition: A | Discharge status: Home / Self Care | Payer: BC Managed Care – PPO | Attending: Emergency Medicine | Admitting: Emergency Medicine

## 2019-07-12 ENCOUNTER — Ambulatory Visit: Payer: BC Managed Care – PPO | Admitting: Gastroenterology

## 2019-07-12 ENCOUNTER — Other Ambulatory Visit: Payer: Self-pay

## 2019-07-12 DIAGNOSIS — R109 Unspecified abdominal pain: Secondary | ICD-10-CM | POA: Diagnosis present

## 2019-07-12 DIAGNOSIS — E119 Type 2 diabetes mellitus without complications: Secondary | ICD-10-CM | POA: Insufficient documentation

## 2019-07-12 DIAGNOSIS — I10 Essential (primary) hypertension: Secondary | ICD-10-CM | POA: Insufficient documentation

## 2019-07-12 DIAGNOSIS — Z79899 Other long term (current) drug therapy: Secondary | ICD-10-CM | POA: Insufficient documentation

## 2019-07-12 DIAGNOSIS — N200 Calculus of kidney: Secondary | ICD-10-CM

## 2019-07-12 LAB — CBC
HCT: 34.5 % — ABNORMAL LOW (ref 36.0–46.0)
Hemoglobin: 11.3 g/dL — ABNORMAL LOW (ref 12.0–15.0)
MCH: 30.8 pg (ref 26.0–34.0)
MCHC: 32.8 g/dL (ref 30.0–36.0)
MCV: 94 fL (ref 80.0–100.0)
Platelets: 328 10*3/uL (ref 150–400)
RBC: 3.67 MIL/uL — ABNORMAL LOW (ref 3.87–5.11)
RDW: 12.3 % (ref 11.5–15.5)
WBC: 9.2 10*3/uL (ref 4.0–10.5)
nRBC: 0 % (ref 0.0–0.2)

## 2019-07-12 LAB — URINALYSIS, COMPLETE (UACMP) WITH MICROSCOPIC
Bacteria, UA: NONE SEEN
Bilirubin Urine: NEGATIVE
Glucose, UA: NEGATIVE mg/dL
Ketones, ur: NEGATIVE mg/dL
Leukocytes,Ua: NEGATIVE
Nitrite: NEGATIVE
Protein, ur: NEGATIVE mg/dL
RBC / HPF: >50 RBC/hpf — ABNORMAL HIGH (ref 0–5)
Specific Gravity, Urine: 1.028 (ref 1.005–1.030)
pH: 5 (ref 5.0–8.0)

## 2019-07-12 LAB — BASIC METABOLIC PANEL
Anion gap: 7 (ref 5–15)
BUN: 14 mg/dL (ref 6–20)
CO2: 22 mmol/L (ref 22–32)
Calcium: 8.4 mg/dL — ABNORMAL LOW (ref 8.9–10.3)
Chloride: 110 mmol/L (ref 98–111)
Creatinine, Ser: 0.56 mg/dL (ref 0.44–1.00)
GFR calc Af Amer: >60 mL/min (ref >60)
GFR calc non Af Amer: >60 mL/min (ref >60)
Glucose, Bld: 83 mg/dL (ref 70–99)
Potassium: 3.7 mmol/L (ref 3.5–5.1)
Sodium: 139 mmol/L (ref 135–145)

## 2019-07-12 MED ORDER — ALUM & MAG HYDROXIDE-SIMETH 200-200-20 MG/5ML PO SUSP
30.0000 mL | Freq: Once | ORAL | Status: AC
  Administered 2019-07-12: 30 mL via ORAL

## 2019-07-12 MED ORDER — LIDOCAINE VISCOUS HCL 2 % MT SOLN
15.0000 mL | Freq: Once | OROMUCOSAL | Status: AC
  Administered 2019-07-12: 15 mL via ORAL

## 2019-07-12 MED ORDER — ONDANSETRON 4 MG PO TBDP: 4.0000 mg | ORAL_TABLET | Freq: Three times a day (TID) | ORAL | 0 refills | Status: DC | PRN

## 2019-07-12 MED ORDER — KETOROLAC TROMETHAMINE 30 MG/ML IJ SOLN
30.0000 mg | Freq: Once | INTRAMUSCULAR | Status: AC
  Administered 2019-07-12: 30 mg via INTRAVENOUS
  Filled 2019-07-12: qty 1

## 2019-07-12 MED ORDER — MORPHINE SULFATE (PF) 4 MG/ML IV SOLN
4.0000 mg | Freq: Once | INTRAVENOUS | Status: AC
  Administered 2019-07-12: 4 mg via INTRAVENOUS
  Filled 2019-07-12: qty 1

## 2019-07-12 MED ORDER — ALUM & MAG HYDROXIDE-SIMETH 200-200-20 MG/5ML PO SUSP
30.0000 mL | Freq: Once | ORAL | Status: DC
  Filled 2019-07-12: qty 30

## 2019-07-12 MED ORDER — ONDANSETRON HCL 4 MG/2ML IJ SOLN
4.0000 mg | Freq: Once | INTRAMUSCULAR | Status: DC
  Filled 2019-07-12: qty 2

## 2019-07-12 MED ORDER — ONDANSETRON HCL 4 MG/2ML IJ SOLN
4.0000 mg | Freq: Once | INTRAMUSCULAR | Status: AC
  Administered 2019-07-12: 4 mg via INTRAVENOUS

## 2019-07-12 MED ORDER — ONDANSETRON HCL 4 MG/2ML IJ SOLN
4.0000 mg | Freq: Once | INTRAMUSCULAR | Status: AC
  Administered 2019-07-12: 4 mg via INTRAVENOUS
  Filled 2019-07-12: qty 2

## 2019-07-12 MED ORDER — LIDOCAINE VISCOUS HCL 2 % MT SOLN
15.0000 mL | Freq: Once | OROMUCOSAL | Status: DC
  Filled 2019-07-12: qty 15

## 2019-07-12 MED ORDER — TAMSULOSIN HCL 0.4 MG PO CAPS: 0.4000 mg | ORAL_CAPSULE | Freq: Every day | ORAL | 0 refills | Status: DC

## 2019-07-12 MED ORDER — SODIUM CHLORIDE 0.9 % IV SOLN
1000.0000 mL | Freq: Once | INTRAVENOUS | Status: AC
  Administered 2019-07-12: 1000 mL via INTRAVENOUS

## 2019-07-12 MED ORDER — OXYCODONE-ACETAMINOPHEN 5-325 MG PO TABS: 1.0000 | ORAL_TABLET | Freq: Four times a day (QID) | ORAL | 0 refills | Status: DC | PRN

## 2019-07-12 NOTE — ED Triage Notes (Signed)
Arrives via pov with c/o left sided flank pain and pain when urinating on both side. Pt ambulatory to triage, NAD noted. Pt states " I feel like I have another kidney stone". Pt reports dark urine.

## 2019-07-12 NOTE — ED Provider Notes (Signed)
[facility] Emergency Department Provider Note   ____________________________________________    I have reviewed the triage vital signs and the nursing notes.   HISTORY  Chief Complaint Flank Pain     HPI Maria Fuller is a 34 y.o. female with a history of kidney stones who presents with complaints of flank pain.  Patient describes left flank pain but also some right flank pain.  She describes the pain radiates into her left lower abdomen.  This feels similar to prior kidney stones.  She has noted some hematuria.  She does not take anything for this.  States pain started yesterday.  Reports she had a CT scan in September which showed a large stone in her left kidney.  Positive nausea, no diarrhea.  Past Medical History:  Diagnosis Date  . Bradycardia   . Depression   . Diabetes mellitus without complication (HCC)    resolved with weight loss from gastric bypass  . Headache    migraines. resolved since gastric bypass  . History of kidney stones    h/o  . Hypertension    resolved since gastric bypass, severe pre-eclampsia with G1  . Meningitis 2015  . PCOS (polycystic ovarian syndrome)   . Preterm labor   . Sleep apnea    had gastric bypass surgery and now resolved  . Syncope     Patient Active Problem List   Diagnosis Date Noted  . Right ovarian cyst 09/09/2018  . Secondary oligomenorrhea 05/25/2018  . Pelvic pain 05/25/2018  . PCOS (polycystic ovarian syndrome) 05/25/2018  . Menometrorrhagia 05/25/2018  . Hydronephrosis 09/22/2016  . Nephrolithiasis 09/22/2016  . Left flank pain 12/11/2015  . Right flank pain 10/06/2015  . History of hypertension 08/30/2015  . History of PCOS 08/30/2015  . Vitamin D deficiency 04/20/2015  . Diabetes mellitus (HCC) 03/27/2015  . Dysmenorrhea 05/25/2014  . Rectal bleeding 05/25/2014  . Contraception 04/07/2014  . Hypothyroidism 07/22/2013  . Major depressive disorder, single episode 07/22/2013    . HTN (hypertension) 07/15/2013  . Headache 07/10/2013  . Anxiety state 03/05/2013  . Morbid obesity with BMI of 50.0-59.9, adult (HCC) 03/05/2013  . Preventative health care 03/05/2013  . History of diabetes mellitus, type II 03/04/2013  . Obstructive sleep apnea 09/14/2012  . Hyperlipidemia 07/28/2012    Past Surgical History:  Procedure Laterality Date  . CESAREAN SECTION    . CESAREAN SECTION N/A 05/07/2016   Procedure: CESAREAN SECTION;  Surgeon: Nadara Mustard, MD;  Location: [facility];  Service: Obstetrics;  Laterality: N/A;  Time of birth: 10:[birth year] Sex: Female Weight: 3940 kg, 8 lb 11 oz  . CHOLECYSTECTOMY    . CYSTOSCOPY WITH STENT PLACEMENT Right 09/19/2016   Procedure: CYSTOSCOPY WITH STENT PLACEMENT;  Surgeon: Hildred Laser, MD;  Location: [facility];  Service: Urology;  Laterality: Right;  . GASTRIC BYPASS  04/2015  . LAPAROSCOPIC OVARIAN CYSTECTOMY Right 09/28/2018   Procedure: LAPAROSCOPIC RIGHT OVARIAN CYSTECTOMY;  Surgeon: Nadara Mustard, MD;  Location: [facility];  Service: Gynecology;  Laterality: Right;  . LEFT HEART CATH AND CORONARY ANGIOGRAPHY Left 04/07/2018   Procedure: LEFT HEART CATH AND CORONARY ANGIOGRAPHY;  Surgeon: Alwyn Pea, MD;  Location: [facility];  Service: Cardiovascular;  Laterality: Left;  . TONSILLECTOMY    . URETEROSCOPY WITH HOLMIUM LASER LITHOTRIPSY Right 09/19/2016   Procedure: URETEROSCOPY WITH HOLMIUM LASER LITHOTRIPSY;  Surgeon: Hildred Laser, MD;  Location: [facility];  Service: Urology;  Laterality: Right;  Prior to Admission medications   Medication Sig Start Date End Date Taking? Authorizing Provider  colestipol (COLESTID) 1 g tablet Take 4 tablets (4 g total) by mouth 2 (two) times daily. 05/24/19 06/23/19  Toney Reil, MD  cyanocobalamin (,VITAMIN B-12,) 1000 MCG/ML injection Inject 1 mL (1,000 mcg total) into the skin once a week for 10 doses. Weekly for 4 weeks then alternate week for another 4weeks then  once every 4weeks 05/25/19 07/28/19  Toney Reil, MD  levonorgestrel (MIRENA) 20 MCG/24HR IUD 1 each by Intrauterine route once.    [provider]  Nitroglycerin 0.4 % OINT Place 1 application rectally 3 (three) times daily. 06/08/19   Toney Reil, MD  ondansetron (ZOFRAN ODT) 4 MG disintegrating tablet Take 1 tablet (4 mg total) by mouth every 8 (eight) hours as needed. 07/12/19   Jene Every, MD  oxyCODONE-acetaminophen (PERCOCET) 5-325 MG tablet Take 1 tablet by mouth every 6 (six) hours as needed for severe pain. 07/12/19 07/11/20  Jene Every, MD  sertraline (ZOLOFT) 100 MG tablet Take 200 mg by mouth at bedtime.     [provider]  tamsulosin (FLOMAX) 0.4 MG CAPS capsule Take 1 capsule (0.4 mg total) by mouth daily. 07/12/19   Jene Every, MD  traZODone (DESYREL) 50 MG tablet Take 50 mg by mouth at bedtime.  09/06/18 09/06/19  [provider]     Allergies Ceftin [cefuroxime axetil] and Penicillins  Family History  Problem Relation Age of Onset  . Polycystic ovary syndrome Mother   . Diabetes Father   . Endometriosis Sister   . Polycystic ovary syndrome Sister   . Breast cancer Paternal Grandmother   . Diabetes Paternal Grandmother   . Diabetes Paternal Grandfather   . Kidney cancer Neg Hx   . Prostate cancer Neg Hx   . Bladder Cancer Neg Hx     Social History Social History   Tobacco Use  . Smoking status: Never Smoker  . Smokeless tobacco: Never Used  Substance Use Topics  . Alcohol use: No  . Drug use: No    Review of Systems  Constitutional: No fever/chills Eyes: No visual changes.  ENT: No sore throat. Cardiovascular: Denies chest pain. Respiratory: Denies shortness of breath. Gastrointestinal: As above Genitourinary: Negative for dysuria.  Positive hematuria Musculoskeletal: Negative for back pain. Skin: Negative for rash. Neurological: Negative for headaches     ____________________________________________   PHYSICAL EXAM:  VITAL SIGNS: ED Triage Vitals  Enc Vitals Group     BP 07/12/19 1624 (!) 150/66     Pulse Rate 07/12/19 1624 (!) 58     Resp 07/12/19 1624 18     Temp 07/12/19 1624 97.9 F (36.6 C)     Temp Source 07/12/19 1624 Oral     SpO2 07/12/19 1624 99 %     Weight 07/12/19 1628 119.3 kg (263 lb)     Height 07/12/19 1628 1.854 m (6\' 1" )     Head Circumference --      Peak Flow --      Pain Score 07/12/19 1710 7     Pain Loc --      Pain Edu? --      Excl. in GC? --     Constitutional: Alert and oriented. e  Nose: No congestion/rhinnorhea. Mouth/Throat: Mucous membranes are moist.   Neck:  Painless ROM Cardiovascular: Normal rate, regular rhythm. Grossly normal heart sounds.  Good peripheral circulation. Respiratory: Normal respiratory effort.  No retractions. Lungs  CTAB. Gastrointestinal: Soft and nontender. No distention.  No CVA tenderness.  Musculoskeletal:  Warm and well perfused Neurologic:  Normal speech and language. No gross focal neurologic deficits are appreciated.  Skin:  Skin is warm, dry and intact. No rash noted. Psychiatric: Mood and affect are normal. Speech and behavior are normal.  ____________________________________________   LABS (all labs ordered are listed, but only abnormal results are displayed)  Labs Reviewed  CBC - Abnormal; Notable for the following components:      Result Value   RBC 3.67 (*)    Hemoglobin 11.3 (*)    HCT 34.5 (*)    All other components within normal limits  BASIC METABOLIC PANEL - Abnormal; Notable for the following components:   Calcium 8.4 (*)    All other components within normal limits  URINALYSIS, COMPLETE (UACMP) WITH MICROSCOPIC - Abnormal; Notable for the following components:   Color, Urine YELLOW (*)    APPearance HAZY (*)    Hgb urine dipstick LARGE (*)    RBC / HPF >50 (*)    All other components within normal limits    ____________________________________________  EKG  None ____________________________________________  RADIOLOGY  KUB demonstrates stable appearance of left kidney stone CT renal stone study ____________________________________________   PROCEDURES  Procedure(s) performed: No  Procedures   Critical Care performed: No ____________________________________________   INITIAL IMPRESSION / ASSESSMENT AND PLAN / ED COURSE  Pertinent labs & imaging results that were available during my care of the patient were reviewed by me and considered in my medical decision making (see chart for details).  Patient presents with left flank pain but also some right flank pain.  Still suspicious for ureterolithiasis especially given large hemoglobin in the urine.  Lab work is overall reassuring, normal white blood cell count  , Normal kidney function.  Treated with IV Toradol, IV Zofran, IV fluids.  KUB ordered initially however given left and right flank pain will send for CT renal stone study.  Patient required a dose of IV morphine  CT scan demonstrates 2 mm distal stone, patient's pain is improved, appropriate for discharge at this time    ____________________________________________   FINAL CLINICAL IMPRESSION(S) / ED DIAGNOSES  Final diagnoses:  Kidney stone        Note:  This document was prepared using Dragon voice recognition software and may include unintentional dictation errors.   Lavonia Drafts, MD 07/13/19 [phone]

## 2019-07-13 ENCOUNTER — Other Ambulatory Visit: Payer: Self-pay

## 2019-07-13 ENCOUNTER — Telehealth: Payer: Self-pay

## 2019-07-13 ENCOUNTER — Other Ambulatory Visit: Payer: Self-pay | Admitting: Gastroenterology

## 2019-07-13 ENCOUNTER — Encounter: Payer: Self-pay | Admitting: Gastroenterology

## 2019-07-13 DIAGNOSIS — R197 Diarrhea, unspecified: Secondary | ICD-10-CM

## 2019-07-13 DIAGNOSIS — R1031 Right lower quadrant pain: Secondary | ICD-10-CM

## 2019-07-13 NOTE — Telephone Encounter (Signed)
realsed   labs that Dr. Allegra Lai order. Called and left a message for call back and sent patient a mychart message

## 2019-07-18 ENCOUNTER — Encounter: Payer: Self-pay | Admitting: Surgery

## 2019-07-18 ENCOUNTER — Ambulatory Visit: Payer: BC Managed Care – PPO | Admitting: Surgery

## 2019-07-18 ENCOUNTER — Other Ambulatory Visit: Payer: Self-pay

## 2019-07-18 VITALS — Ht 73.0 in

## 2019-07-18 DIAGNOSIS — K602 Anal fissure, unspecified: Secondary | ICD-10-CM | POA: Diagnosis not present

## 2019-07-18 NOTE — Patient Instructions (Signed)
Our surgery scheduler will call you to look at surgery dates and go over covid-19 testing information. Please see your [facility] Surgery sheet.   You may want to take off of work for 3-4 days post surgery for recovery.

## 2019-07-19 ENCOUNTER — Encounter: Payer: Self-pay | Admitting: Surgery

## 2019-07-19 NOTE — Progress Notes (Signed)
Patient ID: Maria Fuller, female   DOB: [birth year], 34 y.o.   MRN: [id number]  HPI Maria Fuller is a 34 y.o. female seen in consultation at the request of Dr. Allegra Lai for anal fissure.  He reports that she has been having rectal pain for least 3 months or so.  She is states that the pain is sharp intermittent and has associated hematochezia.  She has had multiple medications including topical hydrocortisone and nifedipine cream without any significant relief.  Does have a history of a duodenal switch performed by Dr. Smitty Cords in 2016 and has had great results and lost more than 140 pounds intentionally.  She does have a history of kidney stones.  She recently underwent a CT scan of the abdomen and pelvis for kidney stone that I have personally reviewed there are no complications related to her duodenal switch and there is some nonsignificant kidney stones. CBC shows mild anemia of 11.3 the rest is normal.  BMP is completely normal. She does have reports some chronic diarrhea, actually improved with Questran.  HPI  Past Medical History:  Diagnosis Date  . Bradycardia   . Depression   . Diabetes mellitus without complication (HCC)    resolved with weight loss from gastric bypass  . Headache    migraines. resolved since gastric bypass  . History of kidney stones    h/o  . Hypertension    resolved since gastric bypass, severe pre-eclampsia with G1  . Meningitis 2015  . PCOS (polycystic ovarian syndrome)   . Preterm labor   . Sleep apnea    had gastric bypass surgery and now resolved  . Syncope     Past Surgical History:  Procedure Laterality Date  . CESAREAN SECTION    . CESAREAN SECTION N/A 05/07/2016   Procedure: CESAREAN SECTION;  Surgeon: Nadara Mustard, MD;  Location: [facility];  Service: Obstetrics;  Laterality: N/A;  Time of birth: 10:[birth year] Sex: Female Weight: 3940 kg, 8 lb 11 oz  . CHOLECYSTECTOMY    . CYSTOSCOPY WITH STENT PLACEMENT Right 09/19/2016   Procedure: CYSTOSCOPY WITH  STENT PLACEMENT;  Surgeon: Hildred Laser, MD;  Location: [facility];  Service: Urology;  Laterality: Right;  . 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 LAPAROSCOPIC OVARIAN CYSTECTOMY Right 09/28/2018   Procedure: LAPAROSCOPIC RIGHT OVARIAN CYSTECTOMY;  Surgeon: Nadara Mustard, MD;  Location: [facility];  Service: Gynecology;  Laterality: Right;  . LEFT HEART CATH AND CORONARY ANGIOGRAPHY Left 04/07/2018   Procedure: LEFT HEART CATH AND CORONARY ANGIOGRAPHY;  Surgeon: Alwyn Pea, MD;  Location: [facility];  Service: Cardiovascular;  Laterality: Left;  . TONSILLECTOMY    . URETEROSCOPY WITH HOLMIUM LASER LITHOTRIPSY Right 09/19/2016   Procedure: URETEROSCOPY WITH HOLMIUM LASER LITHOTRIPSY;  Surgeon: Hildred Laser, MD;  Location: [facility];  Service: Urology;  Laterality: Right;    Family History  Problem Relation Age of Onset  . Polycystic ovary syndrome Mother   . Diabetes Father   . Bladder Cancer Father   . Depression Father   . Endometriosis Sister   . Polycystic ovary syndrome Sister   . Breast cancer Paternal Grandmother   . Diabetes Paternal Grandmother   . Diabetes Paternal Grandfather   . Kidney cancer Neg Hx   . Prostate cancer Neg Hx     Social History Social History   Tobacco Use  . Smoking status: Never Smoker  . Smokeless tobacco: Never Used  Substance Use Topics  .  Alcohol use: No  . Drug use: No    Allergies  Allergen Reactions  . Ceftin [Cefuroxime Axetil] Rash  . Penicillins Rash    Has patient had a PCN reaction causing immediate rash, facial/tongue/throat swelling, SOB or lightheadedness with hypotension: Yes Has patient had a PCN reaction causing severe rash involving mucus membranes or skin necrosis: No Has patient had a PCN reaction that required hospitalization No Has patient had a PCN reaction occurring within the last 10 years: No If all of the above answers are "NO", then may proceed with Cephalosporin use.     Current Outpatient Medications  Medication Sig Dispense Refill  . cyanocobalamin (,VITAMIN B-12,) 1000 MCG/ML injection Inject 1 mL (1,000 mcg total) into the skin once a week for 10 doses. Weekly for 4 weeks then alternate week for another 4weeks then once every 4weeks (Patient taking differently: Inject 1,000 mcg into the skin once a week. Every other week) 10 mL 0  . levonorgestrel (MIRENA) 20 MCG/24HR IUD 1 each by Intrauterine route once.    . Nitroglycerin 0.4 % OINT Place 1 application rectally 3 (three) times daily. 30 g 0  . ondansetron (ZOFRAN ODT) 4 MG disintegrating tablet Take 1 tablet (4 mg total) by mouth every 8 (eight) hours as needed. 20 tablet 0  . oxyCODONE-acetaminophen (PERCOCET) 5-325 MG tablet Take 1 tablet by mouth every 6 (six) hours as needed for severe pain. 12 tablet 0  . sertraline (ZOLOFT) 100 MG tablet Take 200 mg by mouth at bedtime.     . tamsulosin (FLOMAX) 0.4 MG CAPS capsule Take 1 capsule (0.4 mg total) by mouth daily. 7 capsule 0  . traZODone (DESYREL) 50 MG tablet Take 50 mg by mouth at bedtime.     . colestipol (COLESTID) 1 g tablet Take 4 tablets (4 g total) by mouth 2 (two) times daily. 240 tablet 1   No current facility-administered medications for this visit.     Review of Systems Full ROS  was asked and was negative except for the information on the HPI  Physical Exam Height 6\' 1"  (1.854 m). CONSTITUTIONAL: NAD EYES: Pupils are equal, round, and reactive to light, Sclera are non-icteric. EARS, NOSE, MOUTH AND THROAT: She is wearing a mask. Hearing is intact to voice. LYMPH NODES:  Lymph nodes in the neck are normal. RESPIRATORY:  Lungs are clear. There is normal respiratory effort, with equal breath sounds bilaterally, and without pathologic use of accessory muscles. CARDIOVASCULAR: Heart is regular without murmurs, gallops, or rubs. GI: The abdomen is soft, nontender, and nondistended. There are no palpable masses. There is no  hepatosplenomegaly. There are normal bowel sounds in all quadrants. Rectal: See a small posterior midline fissure.  Exam is limited due to tenderness.  There is no masses.  There is increase in the sphincter tone.  MUSCULOSKELETAL: Normal muscle strength and tone. No cyanosis or edema.   SKIN: Turgor is good and there are no pathologic skin lesions or ulcers. NEUROLOGIC: Motor and sensation is grossly normal. Cranial nerves are grossly intact. PSYCH:  Oriented to person, place and time. Affect is normal.  Data Reviewed  I have personally reviewed the patient's imaging, laboratory findings and medical records.    Assessment/Plan 34 year old female with signs and symptoms consistent with anal fissure.  Discussed with the patient in detail about her disease process.  Discussed options of continuing nifedipine cream versus doing Botox injection in the OR.  Given that she has tried multiple medications and none of  them have help she wishes to proceed with exam under anesthesia and placement of Botox for chemical sphincterotomy. Procedure discussed with the patient in detail.  Risk, benefits and possible implications including but not limited to: Bleeding, infection, recurrence, transient incontinence.  She understands and wished to proceed A copy of this report was sent to the referring provider   Caroleen Hamman, MD [facility] 07/19/2019, 12:59 PM

## 2019-07-20 ENCOUNTER — Telehealth: Payer: Self-pay | Admitting: Surgery

## 2019-07-20 NOTE — Telephone Encounter (Signed)
Pt has been advised of pre admission date/time, Covid Testing date and Surgery date.  Surgery Date: 08/12/19 Preadmission Testing Date: 08/08/19 (phone 8a-1p) Covid Testing Date: 08/10/19 - [facility] ([address] [facility])  Patient has been made aware to call [phone], between 1-3:00pm the day before surgery, to find out what time to arrive.

## 2019-07-21 ENCOUNTER — Encounter: Payer: Self-pay | Admitting: Gastroenterology

## 2019-07-21 ENCOUNTER — Other Ambulatory Visit: Payer: Self-pay

## 2019-07-26 ENCOUNTER — Other Ambulatory Visit: Payer: Self-pay

## 2019-07-26 ENCOUNTER — Other Ambulatory Visit
Admission: RE | Admit: 2019-07-26 | Discharge: 2019-07-26 | Disposition: A | Discharge status: Home / Self Care | Payer: BC Managed Care – PPO | Source: Ambulatory Visit | Attending: Gastroenterology | Admitting: Gastroenterology

## 2019-07-26 DIAGNOSIS — Z01812 Encounter for preprocedural laboratory examination: Secondary | ICD-10-CM | POA: Diagnosis present

## 2019-07-26 DIAGNOSIS — Z20822 Contact with and (suspected) exposure to covid-19: Secondary | ICD-10-CM | POA: Insufficient documentation

## 2019-07-26 LAB — SARS CORONAVIRUS 2 (TAT 6-24 HRS): SARS Coronavirus 2: NEGATIVE

## 2019-07-27 NOTE — Discharge Instructions (Signed)
General Anesthesia, Adult, Care After This sheet gives you information about how to care for yourself after your procedure. Your health care provider may also give you more specific instructions. If you have problems or questions, contact your health care provider. What can I expect after the procedure? After the procedure, the following side effects are common:  Pain or discomfort at the IV site.  Nausea.  Vomiting.  Sore throat.  Trouble concentrating.  Feeling cold or chills.  Weak or tired.  Sleepiness and fatigue.  Soreness and body aches. These side effects can affect parts of the body that were not involved in surgery. Follow these instructions at home:  For at least 24 hours after the procedure:  Have a responsible adult stay with you. It is important to have someone help care for you until you are awake and alert.  Rest as needed.  Do not: ? Participate in activities in which you could fall or become injured. ? Drive. ? Use heavy machinery. ? Drink alcohol. ? Take sleeping pills or medicines that cause drowsiness. ? Make important decisions or sign legal documents. ? Take care of children on your own. Eating and drinking  Follow any instructions from your health care provider about eating or drinking restrictions.  When you feel hungry, start by eating small amounts of foods that are soft and easy to digest (bland), such as toast. Gradually return to your regular diet.  Drink enough fluid to keep your urine pale yellow.  If you vomit, rehydrate by drinking water, juice, or clear broth. General instructions  If you have sleep apnea, surgery and certain medicines can increase your risk for breathing problems. Follow instructions from your health care provider about wearing your sleep device: ? Anytime you are sleeping, including during daytime naps. ? While taking prescription pain medicines, sleeping medicines, or medicines that make you drowsy.  Return to  your normal activities as told by your health care provider. Ask your health care provider what activities are safe for you.  Take over-the-counter and prescription medicines only as told by your health care provider.  If you smoke, do not smoke without supervision.  Keep all follow-up visits as told by your health care provider. This is important. Contact a health care provider if:  You have nausea or vomiting that does not get better with medicine.  You cannot eat or drink without vomiting.  You have pain that does not get better with medicine.  You are unable to pass urine.  You develop a skin rash.  You have a fever.  You have redness around your IV site that gets worse. Get help right away if:  You have difficulty breathing.  You have chest pain.  You have blood in your urine or stool, or you vomit blood. Summary  After the procedure, it is common to have a sore throat or nausea. It is also common to feel tired.  Have a responsible adult stay with you for the first 24 hours after general anesthesia. It is important to have someone help care for you until you are awake and alert.  When you feel hungry, start by eating small amounts of foods that are soft and easy to digest (bland), such as toast. Gradually return to your regular diet.  Drink enough fluid to keep your urine pale yellow.  Return to your normal activities as told by your health care provider. Ask your health care provider what activities are safe for you. This information is not   intended to replace advice given to you by your health care provider. Make sure you discuss any questions you have with your health care provider. Document Revised: 04/24/2017 Document Reviewed: 12/05/2016 Elsevier Patient Education  2020 [employer]vier Inc[employer]  

## 2019-07-28 ENCOUNTER — Other Ambulatory Visit: Payer: Self-pay

## 2019-07-28 ENCOUNTER — Encounter: Payer: Self-pay | Admitting: Gastroenterology

## 2019-07-28 ENCOUNTER — Ambulatory Visit: Payer: BC Managed Care – PPO | Admitting: Anesthesiology

## 2019-07-28 ENCOUNTER — Ambulatory Visit
Admission: RE | Admit: 2019-07-28 | Discharge: 2019-07-28 | Disposition: A | Discharge status: Home / Self Care | Payer: BC Managed Care – PPO | Attending: Gastroenterology | Admitting: Gastroenterology

## 2019-07-28 ENCOUNTER — Encounter
Admission: RE | Disposition: A | Discharge status: Home / Self Care | Payer: Self-pay | Source: Home / Self Care | Attending: Gastroenterology

## 2019-07-28 DIAGNOSIS — Z8679 Personal history of other diseases of the circulatory system: Secondary | ICD-10-CM | POA: Diagnosis not present

## 2019-07-28 DIAGNOSIS — Z8639 Personal history of other endocrine, nutritional and metabolic disease: Secondary | ICD-10-CM | POA: Diagnosis not present

## 2019-07-28 DIAGNOSIS — Z793 Long term (current) use of hormonal contraceptives: Secondary | ICD-10-CM | POA: Diagnosis not present

## 2019-07-28 DIAGNOSIS — Z9884 Bariatric surgery status: Secondary | ICD-10-CM | POA: Diagnosis not present

## 2019-07-28 DIAGNOSIS — K589 Irritable bowel syndrome without diarrhea: Secondary | ICD-10-CM | POA: Insufficient documentation

## 2019-07-28 DIAGNOSIS — Z79899 Other long term (current) drug therapy: Secondary | ICD-10-CM | POA: Insufficient documentation

## 2019-07-28 DIAGNOSIS — S31831A Laceration without foreign body of anus, initial encounter: Secondary | ICD-10-CM | POA: Insufficient documentation

## 2019-07-28 DIAGNOSIS — X58XXXA Exposure to other specified factors, initial encounter: Secondary | ICD-10-CM | POA: Diagnosis not present

## 2019-07-28 DIAGNOSIS — K6289 Other specified diseases of anus and rectum: Secondary | ICD-10-CM | POA: Diagnosis present

## 2019-07-28 HISTORY — DX: Anemia, unspecified: D64.9

## 2019-07-28 HISTORY — DX: Dizziness and giddiness: R42

## 2019-07-28 HISTORY — DX: Hypotension, unspecified: I95.9

## 2019-07-28 HISTORY — PX: FLEXIBLE SIGMOIDOSCOPY: SHX5431

## 2019-07-28 LAB — POCT PREGNANCY, URINE: Preg Test, Ur: NEGATIVE

## 2019-07-28 SURGERY — SIGMOIDOSCOPY, FLEXIBLE
Anesthesia: General

## 2019-07-28 MED ORDER — PROPOFOL 10 MG/ML IV BOLUS
INTRAVENOUS | Status: DC | PRN
  Administered 2019-07-28: 150 mg via INTRAVENOUS
  Administered 2019-07-28: 50 mg via INTRAVENOUS

## 2019-07-28 MED ORDER — LIDOCAINE HCL (CARDIAC) PF 100 MG/5ML IV SOSY
PREFILLED_SYRINGE | INTRAVENOUS | Status: DC | PRN
  Administered 2019-07-28: 50 mg via INTRAVENOUS

## 2019-07-28 MED ORDER — LACTATED RINGERS IV SOLN
10.0000 mL/h | INTRAVENOUS | Status: DC
  Administered 2019-07-28: 10 mL/h via INTRAVENOUS

## 2019-07-28 SURGICAL SUPPLY — 25 items
CANISTER SUCT 1200ML W/VALVE (MISCELLANEOUS) ×3 IMPLANT
CLIP HMST 235XBRD CATH ROT (MISCELLANEOUS) IMPLANT
CLIP RESOLUTION 360 11X235 (MISCELLANEOUS)
ELECT REM PT RETURN 9FT ADLT (ELECTROSURGICAL)
ELECTRODE REM PT RTRN 9FT ADLT (ELECTROSURGICAL) IMPLANT
FCP ESCP3.2XJMB 240X2.8X (MISCELLANEOUS)
FORCEPS BIOP RAD 4 LRG CAP 4 (CUTTING FORCEPS) IMPLANT
FORCEPS BIOP RJ4 240 W/NDL (MISCELLANEOUS)
FORCEPS ESCP3.2XJMB 240X2.8X (MISCELLANEOUS) IMPLANT
GOWN CVR UNV OPN BCK APRN NK (MISCELLANEOUS) ×2 IMPLANT
GOWN ISOL THUMB LOOP REG UNIV (MISCELLANEOUS) ×4
INJECTOR VARIJECT VIN23 (MISCELLANEOUS) IMPLANT
KIT DEFENDO VALVE AND CONN (KITS) IMPLANT
KIT ENDO PROCEDURE OLY (KITS) ×3 IMPLANT
MARKER SPOT ENDO TATTOO 5ML (MISCELLANEOUS) IMPLANT
PROBE APC STR FIRE (PROBE) IMPLANT
RETRIEVER NET ROTH 2.5X230 LF (MISCELLANEOUS) IMPLANT
SNARE COLD EXACTO (MISCELLANEOUS) IMPLANT
SNARE SHORT THROW 13M SML OVAL (MISCELLANEOUS) IMPLANT
SNARE SHORT THROW 30M LRG OVAL (MISCELLANEOUS) IMPLANT
SNARE SNG USE RND 15MM (INSTRUMENTS) IMPLANT
SPOT EX ENDOSCOPIC TATTOO (MISCELLANEOUS)
TRAP ETRAP POLY (MISCELLANEOUS) IMPLANT
VARIJECT INJECTOR VIN23 (MISCELLANEOUS)
WATER STERILE IRR 250ML POUR (IV SOLUTION) ×3 IMPLANT

## 2019-07-28 NOTE — Anesthesia Preprocedure Evaluation (Signed)
Anesthesia Evaluation  Patient identified by MRN, date of birth, ID band Patient awake    Reviewed: Allergy & Precautions, H&P , NPO status , Patient's Chart, lab work & pertinent test results  Airway Mallampati: II  TM Distance: >3 FB Neck ROM: full    Dental no notable dental hx.    Pulmonary sleep apnea ,    Pulmonary exam normal breath sounds clear to auscultation       Cardiovascular hypertension, Normal cardiovascular exam Rhythm:regular Rate:Normal     Neuro/Psych PSYCHIATRIC DISORDERS    GI/Hepatic   Endo/Other  diabetesHypothyroidism   Renal/GU      Musculoskeletal   Abdominal   Peds  Hematology   Anesthesia Other Findings   Reproductive/Obstetrics                             Anesthesia Physical Anesthesia Plan  ASA: II  Anesthesia Plan: General   Post-op Pain Management:    Induction: Intravenous  PONV Risk Score and Plan: 3 and Treatment may vary due to age or medical condition, TIVA and Propofol infusion  Airway Management Planned: Natural Airway  Additional Equipment:   Intra-op Plan:   Post-operative Plan:   Informed Consent: I have reviewed the patients History and Physical, chart, labs and discussed the procedure including the risks, benefits and alternatives for the proposed anesthesia with the patient or authorized representative who has indicated his/her understanding and acceptance.     Dental Advisory Given  Plan Discussed with: CRNA  Anesthesia Plan Comments:         Anesthesia Quick Evaluation

## 2019-07-28 NOTE — Anesthesia Procedure Notes (Signed)
Date/Time: 07/28/2019 8:26 AM Performed by: Maree Krabbe, CRNA Pre-anesthesia Checklist: Patient identified, Emergency Drugs available, Suction available, Timeout performed and Patient being monitored Patient Re-evaluated:Patient Re-evaluated prior to induction Oxygen Delivery Method: Nasal cannula Placement Confirmation: positive ETCO2

## 2019-07-28 NOTE — Transfer of Care (Signed)
Immediate Anesthesia Transfer of Care Note  Patient: Maria Fuller  Procedure(s) Performed: FLEXIBLE SIGMOIDOSCOPY (N/A )  Patient Location: PACU  Anesthesia Type: General  Level of Consciousness: awake, alert  and patient cooperative  Airway and Oxygen Therapy: Patient Spontanous Breathing and Patient connected to supplemental oxygen  Post-op Assessment: Post-op Vital signs reviewed, Patient's Cardiovascular Status Stable, Respiratory Function Stable, Patent Airway and No signs of Nausea or vomiting  Post-op Vital Signs: Reviewed and stable  Complications: No apparent anesthesia complications

## 2019-07-28 NOTE — Anesthesia Postprocedure Evaluation (Signed)
Anesthesia Post Note  Patient: Maria Fuller  Procedure(s) Performed: FLEXIBLE SIGMOIDOSCOPY (N/A )     Patient location during evaluation: PACU Anesthesia Type: General Level of consciousness: awake and alert and oriented Pain management: satisfactory to patient Vital Signs Assessment: post-procedure vital signs reviewed and stable Respiratory status: spontaneous breathing, nonlabored ventilation and respiratory function stable Cardiovascular status: blood pressure returned to baseline and stable Postop Assessment: Adequate PO intake and No signs of nausea or vomiting Anesthetic complications: no    Cherly Beach

## 2019-07-28 NOTE — Op Note (Signed)
[facility] Patient Name: Maria Fuller Procedure Date: 07/28/2019 7:42 AM MRN: [id number] Account #: [id number] Date of Birth: 1986/05/05 Admit Type: Outpatient Age: 34 Room: [facility] OR ROOM 01 Gender: Female Note Status: Finalized Procedure:             Flexible Sigmoidoscopy Indications:           Anal pain Providers:             Lin Landsman MD, MD Referring MD:          [place] ***Jarrett Soho, MD (Referring                         MD) Medicines:             Monitored Anesthesia Care Complications:         No immediate complications. Estimated blood loss: None. Procedure:             Pre-Anesthesia Assessment:                        - Prior to the procedure, a History and Physical was                         performed, and patient medications and allergies were                         reviewed. The patient is competent. The risks and                         benefits of the procedure and the sedation options and                         risks were discussed with the patient. All questions                         were answered and informed consent was obtained.                         Patient identification and proposed procedure were                         verified by the physician, the nurse, the                         anesthesiologist, the anesthetist and the technician                         in the pre-procedure area in the procedure room in the                         endoscopy suite. Mental Status Examination: alert and                         oriented. Airway Examination: normal oropharyngeal                         airway and neck mobility. Respiratory Examination:  clear to auscultation. CV Examination: normal.                         Prophylactic Antibiotics: The patient does not require                         prophylactic antibiotics. Prior Anticoagulants: The   patient has taken no previous anticoagulant or                         antiplatelet agents. ASA Grade Assessment: II - A                         patient with mild systemic disease. After reviewing                         the risks and benefits, the patient was deemed in                         satisfactory condition to undergo the procedure. The                         anesthesia plan was to use monitored anesthesia care                         (MAC). Immediately prior to administration of                         medications, the patient was re-assessed for adequacy                         to receive sedatives. The heart rate, respiratory                         rate, oxygen saturations, blood pressure, adequacy of                         pulmonary ventilation, and response to care were                         monitored throughout the procedure. The physical                         status of the patient was re-assessed after the                         procedure.                        After obtaining informed consent, the scope was passed                         under direct vision. The was introduced through the                         anus and advanced to the the rectum. The flexible                         sigmoidoscopy was unusually difficult due  to poor                         bowel prep. The patient tolerated the procedure well.                         The quality of the bowel preparation was poor. Findings:      The perianal and digital rectal examinations were normal. Pertinent       negatives include normal sphincter tone and no palpable rectal lesions.      There was mild spasm at the anus.      Normal mucosa was found in the rectum. An area of linear superficial       tear in the anal canal seen on retroflexion, measuring about 1cm in       length      Unable to advance further due to solid stol Impression:            - Preparation of the colon was poor.                         - Mild colonic spasm.                        - Normal mucosa in the rectum.                        - No specimens collected. Recommendation:        - Discharge patient to home (with escort).                        - Continue present medications.                        - Follow up with Dr Dahlia Byes for botox Procedure Code(s):     --- Professional ---                        [phone], 52, Sigmoidoscopy, flexible; diagnostic,                         including collection of specimen(s) by brushing or                         washing, when performed (separate procedure) Diagnosis Code(s):     --- Professional ---                        K58.9, Irritable bowel syndrome without diarrhea                        K62.89, Other specified diseases of anus and rectum CPT copyright 2019 American Medical Association. All rights reserved. The codes documented in this report are preliminary and upon coder review may  be revised to meet current compliance requirements. Dr. Ulyess Mort Lin Landsman MD, MD 07/28/2019 8:39:35 AM This report has been signed electronically. Number of Addenda: 0 Note Initiated On: 07/28/2019 7:42 AM Total Procedure Duration: 0 hours 2 minutes 52 seconds  Estimated Blood Loss:  Estimated blood loss: none.      [facility]

## 2019-07-28 NOTE — H&P (Signed)
Arlyss Repress, MD [address]  [address]  [place], [place] [address]  Main: [phone]  Fax: [phone] Pager: [phone]  Primary Care Physician:  Clent Jacks, PA-C Primary Gastroenterologist:  Dr. Arlyss Repress  Pre-Procedure History & Physical: HPI:  Maria Fuller is a 34 y.o. female is here for an flexible sigmoidoscopy.   Past Medical History:  Diagnosis Date  . Anemia    slight  . Bradycardia   . Depression   . Diabetes mellitus without complication (HCC)    resolved with weight loss from gastric bypass  . Headache    migraines. resolved since gastric bypass  . History of kidney stones    last week  . Hypertension    resolved since gastric bypass, severe pre-eclampsia with G1  . Low BP   . Meningitis 2015  . PCOS (polycystic ovarian syndrome)   . Preterm labor   . Sleep apnea    had gastric bypass surgery and now resolved  . Syncope   . Vertigo couple years ago    Past Surgical History:  Procedure Laterality Date  . CESAREAN SECTION    . CESAREAN SECTION N/A 05/07/2016   Procedure: CESAREAN SECTION;  Surgeon: Nadara Mustard, MD;  Location: [facility] ORS;  Service: Obstetrics;  Laterality: N/A;  Time of birth: [birth year] Sex: Female Weight: 3940 kg, 8 lb 11 oz  . CHOLECYSTECTOMY    . CYSTOSCOPY WITH STENT PLACEMENT Right 09/19/2016   Procedure: CYSTOSCOPY WITH STENT PLACEMENT;  Surgeon: Hildred Laser, MD;  Location: [facility] ORS;  Service: Urology;  Laterality: Right;  . 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 LAPAROSCOPIC OVARIAN CYSTECTOMY Right 09/28/2018   Procedure: LAPAROSCOPIC RIGHT OVARIAN CYSTECTOMY;  Surgeon: Nadara Mustard, MD;  Location: [facility] ORS;  Service: Gynecology;  Laterality: Right;  . LEFT HEART CATH AND CORONARY ANGIOGRAPHY Left 04/07/2018   Procedure: LEFT HEART CATH AND CORONARY ANGIOGRAPHY;  Surgeon: Alwyn Pea, MD;  Location: [facility] INVASIVE CV LAB;  Service: Cardiovascular;  Laterality: Left;  . TONSILLECTOMY      . URETEROSCOPY WITH HOLMIUM LASER LITHOTRIPSY Right 09/19/2016   Procedure: URETEROSCOPY WITH HOLMIUM LASER LITHOTRIPSY;  Surgeon: Hildred Laser, MD;  Location: [facility] ORS;  Service: Urology;  Laterality: Right;    Prior to Admission medications   Medication Sig Start Date End Date Taking? Authorizing Provider  colestipol (COLESTID) 1 g tablet Take 4 tablets (4 g total) by mouth 2 (two) times daily. 05/24/19 07/28/19 Yes Cidney Kirkwood, Loel Dubonnet, MD  cyanocobalamin (,VITAMIN B-12,) 1000 MCG/ML injection Inject 1 mL (1,000 mcg total) into the skin once a week for 10 doses. Weekly for 4 weeks then alternate week for another 4weeks then once every 4weeks Patient taking differently: Inject 1,000 mcg into the skin once a week. Every other week 05/25/19 07/28/19 Yes Sorayah Schrodt, Loel Dubonnet, MD  sertraline (ZOLOFT) 100 MG tablet Take 200 mg by mouth at bedtime.    Yes [provider]  traZODone (DESYREL) 50 MG tablet Take 50 mg by mouth at bedtime.  09/06/18 09/06/19 Yes [provider]  levonorgestrel (MIRENA) 20 MCG/24HR IUD 1 each by Intrauterine route once.    [provider]  Nitroglycerin 0.4 % OINT Place 1 application rectally 3 (three) times daily. 06/08/19   Toney Reil, MD  ondansetron (ZOFRAN ODT) 4 MG disintegrating tablet Take 1 tablet (4 mg total) by mouth every 8 (eight) hours as needed. Patient not taking: Reported on 07/21/2019 07/12/19  Jene Every, MD  oxyCODONE-acetaminophen (PERCOCET) 5-325 MG tablet Take 1 tablet by mouth every 6 (six) hours as needed for severe pain. Patient not taking: Reported on 07/21/2019 07/12/19 07/11/20  Jene Every, MD  tamsulosin (FLOMAX) 0.4 MG CAPS capsule Take 1 capsule (0.4 mg total) by mouth daily. Patient not taking: Reported on 07/21/2019 07/12/19   Jene Every, MD    Allergies as of 07/11/2019 - Review Complete 07/11/2019  Allergen Reaction Noted  . Ceftin [cefuroxime axetil] Rash 03/03/2018  . Penicillins Rash 06/26/2014     Family History  Problem Relation Age of Onset  . Polycystic ovary syndrome Mother   . Diabetes Father   . Bladder Cancer Father   . Depression Father   . Endometriosis Sister   . Polycystic ovary syndrome Sister   . Breast cancer Paternal Grandmother   . Diabetes Paternal Grandmother   . Diabetes Paternal Grandfather   . Kidney cancer Neg Hx   . Prostate cancer Neg Hx     Social History   Socioeconomic History  . Marital status: Married    Spouse name: Not on file  . Number of children: Not on file  . Years of education: Not on file  . Highest education level: Not on file  Occupational History  . Not on file  Tobacco Use  . Smoking status: Never Smoker  . Smokeless tobacco: Never Used  Substance and Sexual Activity  . Alcohol use: No  . Drug use: No  . Sexual activity: Yes  Other Topics Concern  . Not on file  Social History Narrative  . Not on file   Social Determinants of Health   Financial Resource Strain:   . Difficulty of Paying Living Expenses:   Food Insecurity:   . Worried About Programme researcher, broadcasting/film/video in the Last Year:   . Barista in the Last Year:   Transportation Needs:   . Freight forwarder (Medical):   Marland Kitchen Lack of Transportation (Non-Medical):   Physical Activity:   . Days of Exercise per Week:   . Minutes of Exercise per Session:   Stress:   . Feeling of Stress :   Social Connections:   . Frequency of Communication with Friends and Family:   . Frequency of Social Gatherings with Friends and Family:   . Attends Religious Services:   . Active Member of Clubs or Organizations:   . Attends Banker Meetings:   Marland Kitchen Marital Status:   Intimate Partner Violence:   . Fear of Current or Ex-Partner:   . Emotionally Abused:   Marland Kitchen Physically Abused:   . Sexually Abused:     Review of Systems: See HPI, otherwise negative ROS  Physical Exam: BP 127/71   Pulse (!) 51   Temp 98.1 F (36.7 C)   Ht 6\' 1"  (1.854 m)   Wt 119.3  kg   SpO2 99%   BMI 34.70 kg/m  General:   Alert,  pleasant and cooperative in NAD Head:  Normocephalic and atraumatic. Neck:  Supple; no masses or thyromegaly. Lungs:  Clear throughout to auscultation.    Heart:  Regular rate and rhythm. Abdomen:  Soft, nontender and nondistended. Normal bowel sounds, without guarding, and without rebound.   Neurologic:  Alert and  oriented x4;  grossly normal neurologically.  Impression/Plan: Maria Fuller is here for an flexible sigmoidoscopy to be performed for rectal pain  Risks, benefits, limitations, and alternatives regarding  flexible sigmoidoscopy have been reviewed with  the patient.  Questions have been answered.  All parties agreeable.   Sherri Sear, MD  07/28/2019, 8:15 AM

## 2019-08-04 DIAGNOSIS — K56600 Partial intestinal obstruction, unspecified as to cause: Secondary | ICD-10-CM | POA: Insufficient documentation

## 2019-08-04 DIAGNOSIS — K458 Other specified abdominal hernia without obstruction or gangrene: Secondary | ICD-10-CM | POA: Insufficient documentation

## 2019-08-04 HISTORY — DX: Partial intestinal obstruction, unspecified as to cause: K56.600

## 2019-08-04 MED ORDER — IOHEXOL 300 MG/ML  SOLN
30.00 | INTRAMUSCULAR | Status: DC
Start: ? — End: 2019-08-04

## 2019-08-04 MED ORDER — DEXTROSE-SODIUM CHLORIDE 5-0.45 % IV SOLN
125.00 | INTRAVENOUS | Status: DC
Start: ? — End: 2019-08-04

## 2019-08-04 MED ORDER — IODIXANOL 320 MG/ML IV SOLN
100.00 | INTRAVENOUS | Status: DC
Start: ? — End: 2019-08-04

## 2019-08-08 ENCOUNTER — Telehealth: Payer: Self-pay | Admitting: *Deleted

## 2019-08-08 ENCOUNTER — Inpatient Hospital Stay
Admission: RE | Admit: 2019-08-08 | Discharge: 2019-08-08 | Disposition: A | Discharge status: Home / Self Care | Payer: BC Managed Care – PPO | Source: Ambulatory Visit

## 2019-08-08 NOTE — Pre-Procedure Instructions (Signed)
[facility] CARDIAC CATHETERIZATION Order# [id number] Reading physician: Alwyn Pea, MD Ordering physician: Alwyn Pea, MD Study date: 04/07/18  MyChart Results Release  MyChart Status: Active Results Release  Physicians  Panel Physicians Referring Physician Case Authorizing Physician  Alwyn Pea, MD (Primary)    Procedures  LEFT HEART CATH AND CORONARY ANGIOGRAPHY  Conclusion    Normal LVF  Normal coronaries   Normal LV function EF=60% Normal coronaries Normal cardiac cath Recommend medical therapy for possible microvascular disease Consider noncardiac chest pain evaluation   Recommendations  Antiplatelet/Anticoag No indication for antiplatelet therapy at this time .  Indications  Unstable angina (HCC) [I20.0 (ICD-10-CM)]

## 2019-08-08 NOTE — Telephone Encounter (Signed)
Patient called and stated that she has surgery scheduled for 08/12/19 with Dr Everlene Farrier and wants to cancel it right now due to patient had an emergency surgery on 08/04/19 Please call back

## 2019-08-09 NOTE — Telephone Encounter (Signed)
Outbound call made & message left requesting a call back to discuss options for rescheduling surgery, per her request.

## 2019-08-10 ENCOUNTER — Other Ambulatory Visit: Payer: BC Managed Care – PPO

## 2019-08-18 ENCOUNTER — Telehealth: Payer: Self-pay | Admitting: Surgery

## 2019-08-18 NOTE — Telephone Encounter (Signed)
Updated surgery information.  Outgoing call made, left message for patient to call.  Please advise of Pre-Admission date/time, COVID Testing date and Surgery date.  Surgery Date: 10/25/19 Preadmission Testing Date: 10/18/19 (phone 8a-1p) Covid Testing Date: 10/21/19 - [facility] ([address] [facility]) between 8a-1p   Also patient to call [phone], between 1-3:00pm the day before surgery, to find out what time to arrive for surgery.

## 2019-08-19 NOTE — Telephone Encounter (Signed)
Blue instruction sheet regarding dates for surgery, preadmission screening and Covid testing mailed to the patient

## 2019-10-18 ENCOUNTER — Other Ambulatory Visit: Payer: Self-pay

## 2019-10-18 ENCOUNTER — Other Ambulatory Visit
Admission: RE | Admit: 2019-10-18 | Discharge: 2019-10-18 | Disposition: A | Discharge status: Home / Self Care | Payer: BC Managed Care – PPO | Source: Ambulatory Visit | Attending: Surgery | Admitting: Surgery

## 2019-10-18 HISTORY — DX: Pure hypercholesterolemia, unspecified: E78.00

## 2019-10-18 NOTE — Patient Instructions (Addendum)
Your procedure is scheduled on: Tues 6/22 Report to Day Surgery. To find out your arrival time please call [phone] between 1PM - 3PM on .Mon 6/21  Remember: Instructions that are not followed completely may result in serious medical risk,  up to and including death, or upon the discretion of your surgeon and anesthesiologist your  surgery may need to be rescheduled.     _X__ 1. Do not eat food after midnight the night before your procedure.                 No gum chewing or hard candies. You may drink clear liquids up to 2 hours                 before you are scheduled to arrive for your surgery- DO not drink clear                 liquids within 2 hours of the start of your surgery.                 Clear Liquids include:  water, apple juice without pulp, clear Gatorade, G2 or                  Gatorade Zero (avoid Red/Purple/Blue), Black Coffee or Tea (Do not add                 anything to coffee or tea). _____2.   Complete the carbohydrate drink provided to you, 2 hours before arrival.  __X__2.  On the morning of surgery brush your teeth with toothpaste and water, you                may rinse your mouth with mouthwash if you wish.  Do not swallow any toothpaste of mouthwash.     __ 3.  No Alcohol for 24 hours before or after surgery.   ___ 4.  Do Not Smoke or use e-cigarettes For 24 Hours Prior to Your Surgery.                 Do not use any chewable tobacco products for at least 6 hours prior to                 Surgery.  ___  5.  Do not use any recreational drugs (marijuana, cocaine, heroin, ecstacy, MDMA or other)                For at least one week prior to your surgery.  Combination of these drugs with anesthesia                May have life threatening results.  ____  6.  Bring all medications with you on the day of surgery if instructed.   __x__  7.  Notify your doctor if there is any change in your medical condition      (cold, fever,  infections).     Do not wear jewelry, make-up, hairpins, clips or nail polish. Do not wear lotions, powders, or perfumes. You may wear deodorant. Do not shave 48 hours prior to surgery.  Do not bring valuables to the hospital.    [facility] is not responsible for any belongings or valuables.  Contacts, dentures or bridgework may not be worn into surgery. Leave your suitcase in the car. After surgery it may be brought to your room. For patients admitted to the hospital, discharge time is determined by your treatment team.   Patients discharged  the day of surgery will not be allowed to drive home.   Make arrangements for someone to be with you for the first 24 hours of your Same Day Discharge.    Please read over the following fact sheets that you were given:     __x__ Take these medicines the morning of surgery with A SIP OF WATER:    1. none  2.   3.   4.  5.  6.  ____ Fleet Enema (as directed)   __x__ Shower the night before and the morning of surgery as directed  ____ Use Benzoyl Peroxide Gel as instructed  ____ Use inhalers on the day of surgery  ____ Stop metformin 2 days prior to surgery    ____ Take 1/2 of usual insulin dose the night before surgery. No insulin the morning          of surgery.   ____ Stop Coumadin/Plavix/aspirin   ____ Stop Anti-inflammatories    ____ Stop supplements until after surgery.    ____ Bring C-Pap to the hospital.

## 2019-10-21 ENCOUNTER — Other Ambulatory Visit: Admission: RE | Admit: 2019-10-21 | Payer: BC Managed Care – PPO | Source: Ambulatory Visit

## 2019-10-21 ENCOUNTER — Telehealth: Payer: Self-pay | Admitting: Surgery

## 2019-10-21 NOTE — Telephone Encounter (Signed)
Received call from patient today.  She is cancelling surgery again.  This time due to transportation issues.  When asked about rescheduling she said possibly late July or so.  She is asked that when ready she will need to schedule another follow up with Dr. Everlene Farrier first as her last visit was on 07/18/19.  Patient voices understanding and will call when she decided to reschedule.

## 2019-10-25 ENCOUNTER — Encounter: Admission: RE | Payer: Self-pay | Source: Home / Self Care

## 2019-10-25 ENCOUNTER — Ambulatory Visit: Admission: RE | Admit: 2019-10-25 | Payer: BC Managed Care – PPO | Source: Home / Self Care | Admitting: Surgery

## 2019-10-25 SURGERY — EXAM UNDER ANESTHESIA, RECTUM
Anesthesia: Monitor Anesthesia Care

## 2019-12-12 ENCOUNTER — Ambulatory Visit: Payer: Self-pay | Attending: Internal Medicine

## 2019-12-12 DIAGNOSIS — Z23 Encounter for immunization: Secondary | ICD-10-CM

## 2019-12-12 NOTE — Progress Notes (Signed)
   Covid-19 Vaccination Clinic  Name:  MEGANN EASTERWOOD    MRN: [id number] DOB: 10/17/1985  12/12/2019  Ms. Oyster was observed post Covid-19 immunization for 15 minutes without incident. She was provided with Vaccine Information Sheet and instruction to access the V-Safe system.   Ms. Menefee was instructed to call 911 with any severe reactions post vaccine: Marland Kitchen Difficulty breathing  . Swelling of face and throat  . A fast heartbeat  . A bad rash all over body  . Dizziness and weakness   Immunizations Administered    Name Date Dose VIS Date Route   Pfizer COVID-19 Vaccine 12/12/2019  4:44 PM 0.3 mL 06/29/2018 Intramuscular   Manufacturer: [employer], [employer]   Lot: [id number]   NDC: [address]-8

## 2020-01-02 ENCOUNTER — Ambulatory Visit: Payer: BC Managed Care – PPO

## 2020-05-13 ENCOUNTER — Other Ambulatory Visit: Payer: BC Managed Care – PPO

## 2020-11-16 DIAGNOSIS — F331 Major depressive disorder, recurrent, moderate: Secondary | ICD-10-CM | POA: Insufficient documentation

## 2021-01-21 ENCOUNTER — Other Ambulatory Visit: Payer: Self-pay | Admitting: Physician Assistant

## 2021-01-21 DIAGNOSIS — R110 Nausea: Secondary | ICD-10-CM

## 2021-01-21 DIAGNOSIS — R1011 Right upper quadrant pain: Secondary | ICD-10-CM

## 2021-01-23 ENCOUNTER — Ambulatory Visit: Payer: Medicaid Other

## 2021-05-27 ENCOUNTER — Other Ambulatory Visit: Payer: Self-pay

## 2021-05-27 ENCOUNTER — Emergency Department: Payer: BC Managed Care – PPO

## 2021-05-27 ENCOUNTER — Emergency Department
Admission: EM | Admit: 2021-05-27 | Discharge: 2021-05-27 | Disposition: A | Discharge status: Home / Self Care | Payer: BC Managed Care – PPO | Attending: Emergency Medicine | Admitting: Emergency Medicine

## 2021-05-27 DIAGNOSIS — R1030 Lower abdominal pain, unspecified: Secondary | ICD-10-CM | POA: Diagnosis present

## 2021-05-27 DIAGNOSIS — R1031 Right lower quadrant pain: Secondary | ICD-10-CM | POA: Diagnosis not present

## 2021-05-27 DIAGNOSIS — Z20822 Contact with and (suspected) exposure to covid-19: Secondary | ICD-10-CM | POA: Diagnosis not present

## 2021-05-27 DIAGNOSIS — R109 Unspecified abdominal pain: Secondary | ICD-10-CM

## 2021-05-27 DIAGNOSIS — R110 Nausea: Secondary | ICD-10-CM | POA: Diagnosis not present

## 2021-05-27 DIAGNOSIS — R197 Diarrhea, unspecified: Secondary | ICD-10-CM | POA: Diagnosis not present

## 2021-05-27 LAB — COMPREHENSIVE METABOLIC PANEL
ALT: 23 U/L (ref 0–44)
AST: 19 U/L (ref 15–41)
Albumin: 3.7 g/dL (ref 3.5–5.0)
Alkaline Phosphatase: 76 U/L (ref 38–126)
Anion gap: 5 (ref 5–15)
BUN: 11 mg/dL (ref 6–20)
CO2: 24 mmol/L (ref 22–32)
Calcium: 8 mg/dL — ABNORMAL LOW (ref 8.9–10.3)
Chloride: 110 mmol/L (ref 98–111)
Creatinine, Ser: 0.5 mg/dL (ref 0.44–1.00)
GFR, Estimated: >60 mL/min (ref >60)
Glucose, Bld: 99 mg/dL (ref 70–99)
Potassium: 3.4 mmol/L — ABNORMAL LOW (ref 3.5–5.1)
Sodium: 139 mmol/L (ref 135–145)
Total Bilirubin: 0.7 mg/dL (ref 0.3–1.2)
Total Protein: 6.3 g/dL — ABNORMAL LOW (ref 6.5–8.1)

## 2021-05-27 LAB — CBC
HCT: 32.9 % — ABNORMAL LOW (ref 36.0–46.0)
Hemoglobin: 11 g/dL — ABNORMAL LOW (ref 12.0–15.0)
MCH: 30.9 pg (ref 26.0–34.0)
MCHC: 33.4 g/dL (ref 30.0–36.0)
MCV: 92.4 fL (ref 80.0–100.0)
Platelets: 315 10*3/uL (ref 150–400)
RBC: 3.56 MIL/uL — ABNORMAL LOW (ref 3.87–5.11)
RDW: 12.6 % (ref 11.5–15.5)
WBC: 6.2 10*3/uL (ref 4.0–10.5)
nRBC: 0 % (ref 0.0–0.2)

## 2021-05-27 LAB — URINALYSIS, ROUTINE W REFLEX MICROSCOPIC
Bilirubin Urine: NEGATIVE
Glucose, UA: NEGATIVE mg/dL
Ketones, ur: NEGATIVE mg/dL
Leukocytes,Ua: NEGATIVE
Nitrite: NEGATIVE
Protein, ur: 30 mg/dL — AB
Specific Gravity, Urine: 1.024 (ref 1.005–1.030)
pH: 5 (ref 5.0–8.0)

## 2021-05-27 LAB — PREGNANCY, URINE: Preg Test, Ur: NEGATIVE

## 2021-05-27 LAB — RESP PANEL BY RT-PCR (FLU A&B, COVID) ARPGX2
Influenza A by PCR: NEGATIVE
Influenza B by PCR: NEGATIVE
SARS Coronavirus 2 by RT PCR: NEGATIVE

## 2021-05-27 LAB — LIPASE, BLOOD: Lipase: 23 U/L (ref 11–51)

## 2021-05-27 MED ORDER — ONDANSETRON 4 MG PO TBDP: 4.0000 mg | ORAL_TABLET | Freq: Three times a day (TID) | ORAL | 0 refills | Status: AC | PRN

## 2021-05-27 MED ORDER — IOHEXOL 9 MG/ML PO SOLN
500.0000 mL | Freq: Once | ORAL | Status: AC | PRN
  Administered 2021-05-27: 500 mL via ORAL
  Filled 2021-05-27: qty 500

## 2021-05-27 MED ORDER — IOHEXOL 300 MG/ML  SOLN
100.0000 mL | Freq: Once | INTRAMUSCULAR | Status: AC | PRN
  Administered 2021-05-27: 100 mL via INTRAVENOUS
  Filled 2021-05-27: qty 100

## 2021-05-27 MED ORDER — ONDANSETRON HCL 4 MG/2ML IJ SOLN
4.0000 mg | Freq: Once | INTRAMUSCULAR | Status: AC
  Administered 2021-05-27: 4 mg via INTRAVENOUS
  Filled 2021-05-27: qty 2

## 2021-05-27 NOTE — ED Provider Notes (Signed)
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  Patient reports that this morning she had an episode of diarrhea.  She then had an episode of vomiting with some lower abdominal pain.  She states that this reminded her when she had a bowel obstruction a few years ago that required immediate surgery.  However at that time she had continued vomiting and this time she just has some residual nausea but no continued vomiting.  Denies any chest pain, shortness of breath or other issues.  Physical Exam   Triage Vital Signs: ED Triage Vitals  Enc Vitals Group     BP 05/27/21 0731 117/65     Pulse Rate 05/27/21 0731 (!) 54     Resp 05/27/21 0729 20     Temp 05/27/21 0729 98.4 F (36.9 C)     Temp src --      SpO2 05/27/21 0731 100 %     Weight 05/27/21 0729 243 lb (110.2 kg)     Height 05/27/21 0729 6\' 1"  (1.854 m)     Head Circumference --      Peak Flow --      Pain Score 05/27/21 0729 6     Pain Loc --      Pain Edu? --      Excl. in GC? --     Most recent vital signs: Vitals:   05/27/21 0729 05/27/21 0731  BP:  117/65  Pulse:  (!) 54  Resp: 20   Temp: 98.4 F (36.9 C)   SpO2:  100%     General: Awake, no distress.  CV:  Good peripheral perfusion.  Resp:  Normal effort.  Clear lungs Abd:  No distention. \Slightly tender in the lower abdomen on the right Other:  No swelling in the legs   ED Results / Procedures / Treatments   Labs (all labs ordered are listed, but only abnormal results are displayed) Labs Reviewed  COMPREHENSIVE METABOLIC PANEL - Abnormal; Notable for the following components:      Result Value   Potassium 3.4 (*)    Calcium 8.0 (*)    Total Protein 6.3 (*)     All other components within normal limits  CBC - Abnormal; Notable for the following components:   RBC 3.56 (*)    Hemoglobin 11.0 (*)    HCT 32.9 (*)    All other components within normal limits  URINALYSIS, ROUTINE W REFLEX MICROSCOPIC - Abnormal; Notable for the following components:   Color, Urine YELLOW (*)    APPearance CLOUDY (*)    Hgb urine dipstick SMALL (*)    Protein, ur 30 (*)    Bacteria, UA FEW (*)    All other components within normal limits  LIPASE, BLOOD  PREGNANCY, URINE     EKG  My interpretation of EKG:  Sinus bradycardia rate of 47 without any ST elevation or T wave inversions, normal intervals  RADIOLOGY I have reviewed the CT no acute findings pending rads review    PROCEDURES:  Critical Care performed: No  Procedures   MEDICATIONS ORDERED IN ED: Medications  ondansetron (ZOFRAN) injection 4 mg (4 mg Intravenous Given 05/27/21 0923)  iohexol (OMNIPAQUE) 9 MG/ML oral solution 500 mL (500 mLs Oral Contrast Given 05/27/21 0925)  iohexol (OMNIPAQUE) 300 MG/ML solution 100 mL (100 mLs Intravenous Contrast Given 05/27/21 1050)     IMPRESSION / MDM / ASSESSMENT AND PLAN / ED COURSE  I reviewed the triage vital signs and the nursing notes.  Patient with known duodenal switch who comes in with concerns for vomiting. Differential diagnosis includes, but is not limited to, obstruction, perforation, appendicitis, COVID, flu, Electra abnormalities, AKI.  Patient declined any pain medication but is willing to get some IV Zofran  CT imaging does not have any evidence of complication of her gastric bypass.  She is incidentally noted kidney stone which I have alerted patient to.   See CT with no evidence of significant anemia.  White count is at baseline CMP without evidence of kidney dysfunction, normal LFTs Lipase is normal no evidence of pancreatitis  Patient is feeling better after the Zofran.  Patient CT was reassuring.  Reevaluated patient this pain  is getting better.  Vitals are stable.  Patient tolerating p.o.  Suspect patient could have a viral GI bug, COVID, flu are negative.  Patient feels comfortable with discharge home and return if symptoms are worsening    FINAL CLINICAL IMPRESSION(S) / ED DIAGNOSES   Final diagnoses:  Nausea  Abdominal pain, unspecified abdominal location     Rx / DC Orders   ED Discharge Orders          Ordered    ondansetron (ZOFRAN-ODT) 4 MG disintegrating tablet  Every 8 hours PRN        05/27/21 1211             Note:  This document was prepared using Dragon voice recognition software and may include unintentional dictation errors.   Concha Se, MD 05/27/21 [phone]

## 2021-05-27 NOTE — Discharge Instructions (Addendum)
You can take Tylenol 1 g every 8 hours to help with any pain.  The Zofran to help with any nausea.  Your CT was reassuring this could be a viral bug hopefully will get better over the next 24 to 48 hours.  If symptoms are worsening he can always return to the ER for repeat evaluation  IMPRESSION: 1. No acute abdominopelvic findings. 2. Prior gastric bypass surgery without evidence of complication. 3. Nonobstructive 12 mm left lower pole renal stone. 4. 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

## 2021-05-27 NOTE — ED Triage Notes (Signed)
PT to ED [facility] from home for abd pain that started this am. +n/v. Hx gastric bypass. Hx SBO.  NAD noted  18g Left AC. 500NS going.

## 2021-05-27 NOTE — ED Notes (Signed)
See triage note  presents with some abd pain this am with n/v/d  denies any fever  states pain is mid abd

## 2021-07-09 ENCOUNTER — Other Ambulatory Visit: Payer: Self-pay

## 2021-07-09 ENCOUNTER — Emergency Department: Payer: BC Managed Care – PPO

## 2021-07-09 ENCOUNTER — Encounter: Payer: Self-pay | Admitting: Emergency Medicine

## 2021-07-09 ENCOUNTER — Emergency Department
Admission: EM | Admit: 2021-07-09 | Discharge: 2021-07-10 | Disposition: A | Discharge status: Home / Self Care | Payer: BC Managed Care – PPO | Attending: Emergency Medicine | Admitting: Emergency Medicine

## 2021-07-09 DIAGNOSIS — R109 Unspecified abdominal pain: Secondary | ICD-10-CM

## 2021-07-09 DIAGNOSIS — R519 Headache, unspecified: Secondary | ICD-10-CM | POA: Insufficient documentation

## 2021-07-09 DIAGNOSIS — Z20822 Contact with and (suspected) exposure to covid-19: Secondary | ICD-10-CM | POA: Insufficient documentation

## 2021-07-09 DIAGNOSIS — R110 Nausea: Secondary | ICD-10-CM | POA: Insufficient documentation

## 2021-07-09 DIAGNOSIS — R55 Syncope and collapse: Secondary | ICD-10-CM | POA: Insufficient documentation

## 2021-07-09 DIAGNOSIS — N83201 Unspecified ovarian cyst, right side: Secondary | ICD-10-CM | POA: Diagnosis not present

## 2021-07-09 LAB — BASIC METABOLIC PANEL
Anion gap: 7 (ref 5–15)
BUN: 10 mg/dL (ref 6–20)
CO2: 23 mmol/L (ref 22–32)
Calcium: 8.4 mg/dL — ABNORMAL LOW (ref 8.9–10.3)
Chloride: 108 mmol/L (ref 98–111)
Creatinine, Ser: 0.49 mg/dL (ref 0.44–1.00)
GFR, Estimated: >60 mL/min (ref >60)
Glucose, Bld: 94 mg/dL (ref 70–99)
Potassium: 4.2 mmol/L (ref 3.5–5.1)
Sodium: 138 mmol/L (ref 135–145)

## 2021-07-09 LAB — CBC
HCT: 35 % — ABNORMAL LOW (ref 36.0–46.0)
Hemoglobin: 11.4 g/dL — ABNORMAL LOW (ref 12.0–15.0)
MCH: 30.3 pg (ref 26.0–34.0)
MCHC: 32.6 g/dL (ref 30.0–36.0)
MCV: 93.1 fL (ref 80.0–100.0)
Platelets: 298 10*3/uL (ref 150–400)
RBC: 3.76 MIL/uL — ABNORMAL LOW (ref 3.87–5.11)
RDW: 12.8 % (ref 11.5–15.5)
WBC: 7.7 10*3/uL (ref 4.0–10.5)
nRBC: 0 % (ref 0.0–0.2)

## 2021-07-09 LAB — HEPATIC FUNCTION PANEL
ALT: 23 U/L (ref 0–44)
AST: 18 U/L (ref 15–41)
Albumin: 4 g/dL (ref 3.5–5.0)
Alkaline Phosphatase: 98 U/L (ref 38–126)
Bilirubin, Direct: 0.1 mg/dL (ref 0.0–0.2)
Indirect Bilirubin: 0.4 mg/dL (ref 0.3–0.9)
Total Bilirubin: 0.5 mg/dL (ref 0.3–1.2)
Total Protein: 7 g/dL (ref 6.5–8.1)

## 2021-07-09 LAB — URINALYSIS, ROUTINE W REFLEX MICROSCOPIC
Bacteria, UA: NONE SEEN
Bilirubin Urine: NEGATIVE
Glucose, UA: NEGATIVE mg/dL
Ketones, ur: NEGATIVE mg/dL
Nitrite: NEGATIVE
Protein, ur: NEGATIVE mg/dL
RBC / HPF: >50 RBC/hpf — ABNORMAL HIGH (ref 0–5)
Specific Gravity, Urine: 1.024 (ref 1.005–1.030)
pH: 6 (ref 5.0–8.0)

## 2021-07-09 LAB — RESP PANEL BY RT-PCR (FLU A&B, COVID) ARPGX2
Influenza A by PCR: NEGATIVE
Influenza B by PCR: NEGATIVE
SARS Coronavirus 2 by RT PCR: NEGATIVE

## 2021-07-09 LAB — GROUP A STREP BY PCR: Group A Strep by PCR: NOT DETECTED

## 2021-07-09 LAB — POC URINE PREG, ED: Preg Test, Ur: NEGATIVE

## 2021-07-09 LAB — LIPASE, BLOOD: Lipase: 26 U/L (ref 11–51)

## 2021-07-09 MED ORDER — LACTATED RINGERS IV BOLUS
1000.0000 mL | Freq: Once | INTRAVENOUS | Status: AC
  Administered 2021-07-09: 1000 mL via INTRAVENOUS

## 2021-07-09 MED ORDER — MAGNESIUM SULFATE 2 GM/50ML IV SOLN
2.0000 g | Freq: Once | INTRAVENOUS | Status: AC
  Administered 2021-07-09: 2 g via INTRAVENOUS
  Filled 2021-07-09: qty 50

## 2021-07-09 MED ORDER — PROCHLORPERAZINE EDISYLATE 10 MG/2ML IJ SOLN
10.0000 mg | Freq: Once | INTRAMUSCULAR | Status: AC
Start: 2021-07-09 — End: 2021-07-09
  Administered 2021-07-09: 10 mg via INTRAVENOUS
  Filled 2021-07-09: qty 2

## 2021-07-09 NOTE — ED Provider Notes (Signed)
[facility] Provider Note    Event Date/Time   First MD Initiated Contact with Patient 07/09/21 2149     (approximate)   History   Near Syncope and Headache   HPI  Maria Fuller is a 36 y.o. female with remote history of gastric bypass in 2016 complicated by SBO requiring surgery in 2021, cholecystostomy, laparoscopic ovarian cystectomy, and previous ureteroscopy with laser lithotripsy, aseptic meningitis with some subsequent migraines following months although none in the last couple months and kidney stones who presents for assessment of 2 issues.  Patient states she has had some soreness in her right lower back for the last couple days and she is wondering if she could have a kidney stone.  She states that today she developed a sore throat this morning and went to urgent care where she got strep screen that was negative but then after getting home developed sudden headache around 6 PM.  She states she went to lie down her bed think she blacked out for a few minutes.  She is adamant she did not fall or hit her head.  She endorses photophobia and states also squiggly lines in her vision.  No trauma.  No other vision changes,.  No vertigo.  No neck pain, chest pain, cough, shortness of breath, fevers, earache, abdominal pain, vomiting, diarrhea, burning with urination or any other acute sick symptoms.  Denies EtOH use illicit drug use or tobacco abuse.  No other acute concerns at this time.    Past Surgical History:  Procedure Laterality Date   CESAREAN SECTION     CESAREAN SECTION N/A 05/07/2016   Procedure: CESAREAN SECTION;  Surgeon: Nadara Mustard, MD;  Location: [facility];  Service: Obstetrics;  Laterality: N/A;  Time of birth: [birth year] Sex: Female Weight: 3940 kg, 8 lb 11 oz   CHOLECYSTECTOMY     CYSTOSCOPY WITH STENT PLACEMENT Right 09/19/2016   Procedure: CYSTOSCOPY WITH STENT PLACEMENT;  Surgeon: Hildred Laser, MD;  Location: [facility];  Service: Urology;   Laterality: Right;   FLEXIBLE SIGMOIDOSCOPY N/A 07/28/2019   Procedure: FLEXIBLE SIGMOIDOSCOPY;  Surgeon: Toney Reil, MD;  Location: [facility];  Service: Endoscopy;  Laterality: N/A;   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   LAPAROSCOPIC OVARIAN CYSTECTOMY Right 09/28/2018   Procedure: LAPAROSCOPIC RIGHT OVARIAN CYSTECTOMY;  Surgeon: Nadara Mustard, MD;  Location: [facility];  Service: Gynecology;  Laterality: Right;   LEFT HEART CATH AND CORONARY ANGIOGRAPHY Left 04/07/2018   Procedure: LEFT HEART CATH AND CORONARY ANGIOGRAPHY;  Surgeon: Alwyn Pea, MD;  Location: [facility];  Service: Cardiovascular;  Laterality: Left;   small bowel obstruction  07/2019   TONSILLECTOMY     URETEROSCOPY WITH HOLMIUM LASER LITHOTRIPSY Right 09/19/2016   Procedure: URETEROSCOPY WITH HOLMIUM LASER LITHOTRIPSY;  Surgeon: Hildred Laser, MD;  Location: [facility];  Service: Urology;  Laterality: Right;     Physical Exam  Triage Vital Signs: ED Triage Vitals [07/09/21 1939]  Enc Vitals Group     BP 134/74     Pulse Rate 63     Resp 18     Temp 97.9 F (36.6 C)     Temp Source Oral     SpO2 99 %     Weight 250 lb (113.4 kg)     Height 6\' 1"  (1.854 m)     Head Circumference      Peak Flow  Pain Score 7     Pain Loc      Pain Edu?      Excl. in GC?     Most recent vital signs: Vitals:   07/09/21 1939 07/09/21 2254  BP: 134/74 120/64  Pulse: 63 64  Resp: 18 20  Temp: 97.9 F (36.6 C) 97.8 F (36.6 C)  SpO2: 99% 98%    General: Awake, appears uncomfortable.  Is not meningitic.  Is alert and oriented. CV:  Good peripheral perfusion.  2+ radial pulses. Resp:  Normal effort.  Abd:  No distention.  Soft throughout. Other:  Cranial nerves II through XII are grossly intact.  There are some mild posterior oropharyngeal erythema without any tonsillar exudates, uvular deviation or other abnormalities of the oropharynx.  Full range of motion of the  neck.  Cranial nerves II through XII grossly intact.  No pronator drift.  No finger dysmetria.  Symmetric 5/5 strength of all extremities.  Sensation intact to light touch in all extremities.  Unremarkable unassisted gait.   Very mild right CVA tenderness without any overlying skin changes.  Abdomen soft throughout.   ED Results / Procedures / Treatments  Labs (all labs ordered are listed, but only abnormal results are displayed) Labs Reviewed  BASIC METABOLIC PANEL - Abnormal; Notable for the following components:      Result Value   Calcium 8.4 (*)    All other components within normal limits  CBC - Abnormal; Notable for the following components:   RBC 3.76 (*)    Hemoglobin 11.4 (*)    HCT 35.0 (*)    All other components within normal limits  URINALYSIS, ROUTINE W REFLEX MICROSCOPIC - Abnormal; Notable for the following components:   Color, Urine YELLOW (*)    APPearance HAZY (*)    Hgb urine dipstick MODERATE (*)    Leukocytes,Ua TRACE (*)    RBC / HPF >50 (*)    All other components within normal limits  GROUP A STREP BY PCR  RESP PANEL BY RT-PCR (FLU A&B, COVID) ARPGX2  HEPATIC FUNCTION PANEL  LIPASE, BLOOD  POC URINE PREG, ED     EKG  EKG shows sinus rhythm with a ventricular rate of 61, normal axis, unremarkable intervals without evidence of acute ischemia or significant arrhythmia.   RADIOLOGY   CT head on my interpretation without evidence of hemorrhage, mass effect, edema or other acute intracranial process.  I also reviewed radiologist interpretation and agree with the findings of same as well as notation of some mild left ethmoid sinus and left-sided frontal sinus disease.  My interpretation of the CT abdomen pelvis there is some dilation of proximal small bowel without a clear cutoff point.  I do not see evidence of ureteral stone or hydronephrosis.  I also reviewed radiology interpretation and discussed with radiologist concerning findings for possible  SBO with a mesenteric hernia.  There is evidence of prior gastric bypass surgery and prior hysterectomy as well as a stable 12 mm nonobstructing left-sided kidney stone and 3.8 cm x 2.5 cm right adnexal cyst.   PROCEDURES:  Critical Care performed: No  Procedures    MEDICATIONS ORDERED IN ED: Medications  lactated ringers bolus 1,000 mL (1,000 mLs Intravenous New Bag/Given 07/09/21 2245)  magnesium sulfate IVPB 2 g 50 mL (2 g Intravenous New Bag/Given 07/09/21 2256)  prochlorperazine (COMPAZINE) injection 10 mg (10 mg Intravenous Given 07/09/21 2243)     IMPRESSION / MDM / ASSESSMENT AND PLAN / ED COURSE  I reviewed the triage vital signs and the nursing notes.                              With regard to patient's headache and vision changes and photophobia concern for possible migraine versus other primary headache.  I will obtain a CT head to rule out subarachnoid hemorrhage.  Lower suspicion at this time for CVT, trauma or deep space infection head or neck.  Regard to the crampy right lower back pain similar to prior kidney stone pain certainly possible this is led to a stone versus MSK versus a diverticulitis or cholecystitis.  Pregnancy test is negative.  COVID influenza PCR is negative.  Rapid strep screen is negative.  BMP without any significant electrolyte or metabolic derangements.  CBC without leukocytosis or acute anemia.  UA has some hemoglobin trace leukocyte esterase but no other findings to suggest clinically significant cystitis at this time.  Lipase 26 not suggestive of pancreatitis.  CT head on my interpretation without evidence of hemorrhage, mass effect, edema or other acute intracranial process.  I also reviewed radiologist interpretation and agree with the findings of same as well as notation of some mild left ethmoid sinus and left-sided frontal sinus disease.  My interpretation of the CT abdomen pelvis there is some dilation of proximal small bowel without a clear  cutoff point.  I do not see evidence of ureteral stone or hydronephrosis.  I also reviewed radiology interpretation and discussed with radiologist concerning findings for possible SBO with a mesenteric hernia.  There is evidence of prior gastric bypass surgery and prior hysterectomy as well as a stable 12 mm nonobstructing left-sided kidney stone and 3.8 cm x 2.5 cm right adnexal cyst.  On reassessment patient states her headache and nausea is much better.  Somewhat unexpected findings on CT.  Patient reports she is moving her bowels and has not actually vomited at all today.  Discussed with on-call surgeon at [facility] Dr. Elenor Legato who recommended reaching out to [facility] and speaking with bariatric surgery to obtain further recommendations.  Additionally possible right-sided abdominal discomfort is related to an ovarian cyst as clinically she does not otherwise have signs of a bowel obstruction given absence of any vomiting and patient reports having normal bowel movement today.  Care patient signed over to assuming father approximately 2300.  Plan is to follow-up with bariatric surgery at [facility] and dispo after discussion with them.  Given patient has no abdominal tenderness on my reassessment without any vomiting reports she is moving her bowels if they determined she is safe for close outpatient clinic follow-up I think this would potentially be reasonable.      FINAL CLINICAL IMPRESSION(S) / ED DIAGNOSES   Final diagnoses:  Acute nonintractable headache, unspecified headache type  Nausea  Flank pain  Cyst of right ovary     Rx / DC Orders   ED Discharge Orders     None        Note:  This document was prepared using Dragon voice recognition software and may include unintentional dictation errors.   Gilles Chiquito, MD 07/10/21 [phone]

## 2021-07-09 NOTE — ED Triage Notes (Signed)
Pt arrived via POV with c/o HA since this morning, pt states she was seen at Urgent Care, for HA and sore throat, pt states strep test was neg. Pt states her HA continued throughout the day saw squiggly lines that lasted about 30 minutes and the HA worsened after that. ? ?Pt states she has felt nauseated and had what she described as near syncopal episode  ? ?Pt reports she had viral meningitis several years ago and has had migraines intermittently but has been a long time since last one. ?

## 2021-07-10 NOTE — Discharge Instructions (Signed)
Please follow up with your bariatric surgeon considering CT findings today showing possible Small Bowel Obstruction.  ? ?If you develop any severely worsening abdominal pain, Nausea, Vomiting or Diarrhea, please return to the ED. ?

## 2021-07-10 NOTE — ED Provider Notes (Signed)
?  Patient received in signout from Dr. Antoine Primas pending conversation with bariatric surgery at [facility] considering radiographic finding concerning for SBO.  ? ?Briefly, patient has multiple intra-abdominal surgeries in the past, including a duodenal switch.  She presented with concerns for a migrainous headache, flank pain and possible syncopal episode.  Noncon CT of the abdomen without any ureteral stones, but had some concerns for SBO. ? ?At the time of signout, I examined the patient with Antoine Primas.  She has benign abdomen without tenderness guarding or peritoneal features.  She reports feeling much better after the Compazine and resolution of her headache.  Denies any abdominal pain. ? ?I discussed the case with Lewis Moccasin, PA with the bariatric surgical service at [facility].  We discussed presentation and CT results, and he agrees that considering her reassuring clinical status and functional GI status that she would be suitable for close outpatient follow-up.  I updated the patient of this and we discussed appropriate return precautions. ?  ?Delton Prairie, MD ?07/10/21 [phone] ? ?

## 2021-07-22 ENCOUNTER — Emergency Department: Payer: BC Managed Care – PPO

## 2021-07-22 ENCOUNTER — Other Ambulatory Visit: Payer: Self-pay

## 2021-07-22 ENCOUNTER — Emergency Department
Admission: EM | Admit: 2021-07-22 | Discharge: 2021-07-23 | Disposition: A | Discharge status: Home / Self Care | Payer: BC Managed Care – PPO | Attending: Emergency Medicine | Admitting: Emergency Medicine

## 2021-07-22 ENCOUNTER — Encounter: Payer: Self-pay | Admitting: Emergency Medicine

## 2021-07-22 DIAGNOSIS — R5082 Postprocedural fever: Secondary | ICD-10-CM | POA: Diagnosis not present

## 2021-07-22 DIAGNOSIS — R195 Other fecal abnormalities: Secondary | ICD-10-CM | POA: Insufficient documentation

## 2021-07-22 DIAGNOSIS — R110 Nausea: Secondary | ICD-10-CM | POA: Diagnosis not present

## 2021-07-22 DIAGNOSIS — R1031 Right lower quadrant pain: Secondary | ICD-10-CM | POA: Diagnosis present

## 2021-07-22 DIAGNOSIS — N83201 Unspecified ovarian cyst, right side: Secondary | ICD-10-CM | POA: Insufficient documentation

## 2021-07-22 LAB — COMPREHENSIVE METABOLIC PANEL
ALT: 24 U/L (ref 0–44)
AST: 20 U/L (ref 15–41)
Albumin: 4.2 g/dL (ref 3.5–5.0)
Alkaline Phosphatase: 92 U/L (ref 38–126)
Anion gap: 4 — ABNORMAL LOW (ref 5–15)
BUN: 12 mg/dL (ref 6–20)
CO2: 24 mmol/L (ref 22–32)
Calcium: 8.5 mg/dL — ABNORMAL LOW (ref 8.9–10.3)
Chloride: 107 mmol/L (ref 98–111)
Creatinine, Ser: 0.5 mg/dL (ref 0.44–1.00)
GFR, Estimated: >60 mL/min (ref >60)
Glucose, Bld: 91 mg/dL (ref 70–99)
Potassium: 4 mmol/L (ref 3.5–5.1)
Sodium: 135 mmol/L (ref 135–145)
Total Bilirubin: 0.8 mg/dL (ref 0.3–1.2)
Total Protein: 7.5 g/dL (ref 6.5–8.1)

## 2021-07-22 LAB — URINALYSIS, COMPLETE (UACMP) WITH MICROSCOPIC
Bacteria, UA: NONE SEEN
Bilirubin Urine: NEGATIVE
Glucose, UA: NEGATIVE mg/dL
Ketones, ur: NEGATIVE mg/dL
Leukocytes,Ua: NEGATIVE
Nitrite: NEGATIVE
Protein, ur: NEGATIVE mg/dL
Specific Gravity, Urine: >1.046 — ABNORMAL HIGH (ref 1.005–1.030)
pH: 5 (ref 5.0–8.0)

## 2021-07-22 LAB — CBC
HCT: 36.7 % (ref 36.0–46.0)
Hemoglobin: 12 g/dL (ref 12.0–15.0)
MCH: 30.2 pg (ref 26.0–34.0)
MCHC: 32.7 g/dL (ref 30.0–36.0)
MCV: 92.4 fL (ref 80.0–100.0)
Platelets: 340 10*3/uL (ref 150–400)
RBC: 3.97 MIL/uL (ref 3.87–5.11)
RDW: 12 % (ref 11.5–15.5)
WBC: 6.8 10*3/uL (ref 4.0–10.5)
nRBC: 0 % (ref 0.0–0.2)

## 2021-07-22 LAB — POC URINE PREG, ED: Preg Test, Ur: NEGATIVE

## 2021-07-22 MED ORDER — IOHEXOL 300 MG/ML  SOLN
100.0000 mL | Freq: Once | INTRAMUSCULAR | Status: AC | PRN
  Administered 2021-07-22: 100 mL via INTRAVENOUS
  Filled 2021-07-22: qty 100

## 2021-07-22 MED ORDER — ONDANSETRON HCL 4 MG/2ML IJ SOLN
4.0000 mg | Freq: Once | INTRAMUSCULAR | Status: AC
  Administered 2021-07-22: 4 mg via INTRAVENOUS
  Filled 2021-07-22: qty 2

## 2021-07-22 MED ORDER — ONDANSETRON HCL 4 MG/2ML IJ SOLN
4.0000 mg | Freq: Once | INTRAMUSCULAR | Status: AC
Start: 2021-07-22 — End: 2021-07-22
  Administered 2021-07-22: 4 mg via INTRAVENOUS
  Filled 2021-07-22: qty 2

## 2021-07-22 MED ORDER — MORPHINE SULFATE (PF) 4 MG/ML IV SOLN
4.0000 mg | Freq: Once | INTRAVENOUS | Status: AC
  Administered 2021-07-22: 4 mg via INTRAVENOUS
  Filled 2021-07-22: qty 1

## 2021-07-22 MED ORDER — PANTOPRAZOLE SODIUM 40 MG IV SOLR
40.0000 mg | Freq: Once | INTRAVENOUS | Status: AC
  Administered 2021-07-22: 40 mg via INTRAVENOUS
  Filled 2021-07-22: qty 10

## 2021-07-22 NOTE — ED Triage Notes (Signed)
Pt to ED via POV with c/o abd pain in her RLQ pain after having and internal hemorrhoid fixed. She is having blood in her urine, it has slowed down but she is still having pain and blood.  ?

## 2021-07-22 NOTE — ED Notes (Signed)
Pt given warm blanket; room temp adjusted for pt.  ?

## 2021-07-22 NOTE — ED Notes (Signed)
Pt up to restroom.

## 2021-07-22 NOTE — ED Notes (Signed)
Pt agreed to notify this RN or other RN if in about 20 minutes her pain isn't significantly decreased; visitor remains at bedside with pt. Call bell within reach. Rail up. Stretcher locked low.  ?

## 2021-07-22 NOTE — ED Provider Notes (Signed)
? ?[facility] ?Provider Note ? ?Patient Contact: 5:24 PM (approximate) ? ? ?History  ? ?Abdominal Pain and Hematuria ? ? ?HPI ? ?Maria Fuller is a 36 y.o. female with history of recent  laparoscopy (Dr. 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  Patient states that she had a low-grade fever on Saturday and then developed intermittent right lower quadrant abdominal pain with some nausea but no vomiting.  Patient states that she has been passing bright red blood with stooling and with wiping.  She did state that she was initially constipated after surgery but has no history of hemorrhoids.  Patient states that she has had hematochezia only with her anal fissure in the past but states that her current symptoms do not feel similar as when she had her anal fissure. She denies chest pain, chest tightness or shortness of breath.  She was evaluated at her primary care's office who recommended that she seek care at [facility] regional for initial diagnostic labs and imaging. ? ?  ? ? ?Physical Exam  ? ?Triage Vital Signs: ?ED Triage Vitals  ?Enc Vitals Group  ?   BP 07/22/21 1641 135/69  ?   Pulse Rate 07/22/21 1641 61  ?   Resp 07/22/21 1641 18  ?   Temp 07/22/21 1641 99.1 ?F (37.3 ?C)  ?   Temp Source 07/22/21 1641 Oral  ?   SpO2 07/22/21 1641 100 %  ?   Weight 07/22/21 1642 250 lb 3.6 oz (113.5 kg)  ?   Height 07/22/21 1642 6\' 1"  (1.854 m)  ?   Head Circumference --   ?   Peak Flow --   ?   Pain Score 07/22/21 1642 5  ?   Pain Loc --   ?   Pain Edu? --   ?   Excl. in [place]? --   ? ? ?Most recent vital signs: ?Vitals:  ? 07/22/21 1641  ?BP: 135/69  ?Pulse: 61  ?Resp: 18  ?Temp: 99.1 ?F (37.3 ?C)  ?SpO2: 100%  ? ? ? ?General: Alert and in no acute distress. ?Eyes:  PERRL. EOMI. ?Head: No acute traumatic findings ?ENT: ?      Ears:  ?     Nose: No congestion/rhinnorhea. ?     Mouth/Throat: Mucous membranes are moist. ?Neck: No stridor. No cervical spine tenderness to palpation. ?Cardiovascular:  Good peripheral perfusion ?Respiratory: Normal respiratory effort without tachypnea or retractions. Lungs CTAB. Good air entry to the bases with no decreased or absent breath sounds. ?Gastrointestinal: Bowel sounds ?4 quadrants.  Patient does have right lower quadrant tenderness with guarding.  No palpable masses. No distention. No CVA tenderness.  Patient had bright red blood visualized with rectal exam. ?Musculoskeletal: Full range of motion to all extremities.  ?Neurologic:  No gross focal neurologic deficits are appreciated.  ?Skin:   No rash noted ?Other: ? ? ?ED Results / Procedures / Treatments  ? ?Labs ?(all labs ordered are listed, but only abnormal results are displayed) ?Labs Reviewed  ?COMPREHENSIVE METABOLIC PANEL - Abnormal; Notable for the following components:  ?    Result Value  ? Calcium 8.5 (*)   ? Anion gap 4 (*)   ? All other components within normal limits  ?CBC  ?POC URINE PREG, ED  ?TYPE AND SCREEN  ? ? ? ? ?  RADIOLOGY ? ?I personally viewed and evaluated these images as part of my medical decision making, as well as reviewing the written report by the radiologist. ? ?ED Provider Interpretation: I personally reviewed CT abdomen pelvis and dedicated pelvic ultrasound.  Patient has large, 6.8 cm right-sided ovarian cyst, likely hemorrhagic in nature with no evidence of torsion.  I agree with radiologist interpretation. ? ? ?PROCEDURES: ? ?Critical Care performed: No ? ?Procedures ? ? ?MEDICATIONS ORDERED IN ED: ?Medications - No data to display ? ? ?IMPRESSION / MDM / ASSESSMENT AND PLAN / ED COURSE  ?I reviewed the triage vital signs and the nursing notes. ?             ?               ? ?Differential diagnosis includes, but is not limited to, appendicitis, small bowel obstruction, constipation, anal fissure,  reherniation... ? ?Assessment and plan: ?Rectal bleeding: ?Right lower quadrant abdominal pain:  ?36 year old female with history of laparoscopy on 3/10 at [facility] for internal hernia repair given pseudo Petersons defect presents to the emergency department with right lower quadrant abdominal pain and hematochezia. ? ?Vital signs were reassuring at triage.  On physical exam, patient was alert and active but did have right lower quadrant tenderness with guarding on exam.  She also had bright red blood with rectal exam with brown stool. ? ?CBC indicated a normal H&H.  CMP within reference range.  Urinalysis shows no signs of UTI.  CT abdomen pelvis was concerning for a 6.8 cm right-sided cyst, likely hemorrhagic and ovarian in nature.  Dedicated pelvic ultrasound was obtained with Doppler which showed no evidence of torsion and confirmed ovarian cyst.  I reached out to [facility] on-call, Dr. Amalia Hailey who was reassured by patient's labs and feel that patient can follow-up in the outpatient setting. ? ? ?I consulted general surgeon on-call with [facility], Dr. Flavia Shipper.  Very much appreciate time and consult.  Dr. Flavia Shipper was reassured by patient's work-up and CT findings.  He feels that patient is appropriate for outpatient follow-up unless rectal bleeding and pain increases.  Patient was given return precautions to return to the emergency department for new or worsening symptoms. ? ?I discussed patient case with attending Dr. Leonides Schanz who agrees with management plan at this time.  Patient was prescribed a short course of Percocet for pain and given follow-up referrals to both general surgery and to Dr. [name] with OB/GYN. ? ? ?Clinical Course as of 07/22/21 2358  ?Mon Jul 22, 2021  ?2336 Total Bilirubin: 0.8 [JW]  ?  ?Clinical Course User Index ?[JW] Vallarie Mare M, PA-C  ? ? ? ?FINAL CLINICAL IMPRESSION(S) / ED DIAGNOSES  ? ?Final diagnoses:  ?None  ? ? ? ?Rx / DC Orders  ? ?ED Discharge Orders   ? ? None  ? ?   ? ? ? ?Note:  This document was prepared using Dragon voice recognition software and may include unintentional dictation errors. ?  ?Lannie Fields, PA-C ?07/23/21 0013 ? ?  ?Nance Pear, MD ?07/25/21 1217 ? ?

## 2021-07-22 NOTE — ED Notes (Signed)
See triage note. Pt reports having had recent "hernia" surgery; reports enough bright red blood to coat toilet paper first time she wiped; reports mild nausea currently; tender at RLQ.  ?

## 2021-07-22 NOTE — ED Notes (Signed)
Patient taken off floor to imaging at this time. ? ?

## 2021-07-23 ENCOUNTER — Encounter: Payer: Self-pay | Admitting: Obstetrics & Gynecology

## 2021-07-23 ENCOUNTER — Ambulatory Visit: Payer: BC Managed Care – PPO | Admitting: Obstetrics & Gynecology

## 2021-07-23 VITALS — BP 130/80 | Ht 73.0 in | Wt 245.0 lb

## 2021-07-23 DIAGNOSIS — N83201 Unspecified ovarian cyst, right side: Secondary | ICD-10-CM

## 2021-07-23 DIAGNOSIS — R102 Pelvic and perineal pain: Secondary | ICD-10-CM | POA: Diagnosis not present

## 2021-07-23 MED ORDER — OXYCODONE-ACETAMINOPHEN 5-325 MG PO TABS
1.0000 | ORAL_TABLET | Freq: Once | ORAL | Status: AC
  Administered 2021-07-23: 1 via ORAL
  Filled 2021-07-23: qty 1

## 2021-07-23 MED ORDER — OXYCODONE-ACETAMINOPHEN 5-325 MG PO TABS: 1.0000 | ORAL_TABLET | Freq: Four times a day (QID) | ORAL | 0 refills | Status: AC | PRN

## 2021-07-23 MED ORDER — ONDANSETRON 4 MG PO TBDP
4.0000 mg | ORAL_TABLET | Freq: Once | ORAL | Status: AC
  Administered 2021-07-23: 4 mg via ORAL
  Filled 2021-07-23: qty 1

## 2021-07-23 MED ORDER — ONDANSETRON 4 MG PO TBDP: 4.0000 mg | ORAL_TABLET | Freq: Three times a day (TID) | ORAL | 0 refills | Status: AC | PRN

## 2021-07-23 NOTE — H&P (View-Only) (Signed)
?   PRE-OPERATIVE HISTORY AND PHYSICAL EXAM ? ?HPI:  Maria Fuller is a 36 y.o. [id number] No LMP recorded. (Menstrual status: IUD).; she is being admitted for surgery related to  pelvic pain and RLQ pain related to 6 cm (by US) right ovarian cyst .  Pt had pain and hernia surgery earlier this month, but 3 days ago noted worsening pain again on the right side (same side as hernia) and now notes a right cyst on the ovary.  Prior surgery for ovarian cystectomy (not endometriosis) 3 yrs ago.  No prior problems w left ovary.  IUd controls periods and pregnancy prevention. ? ?PMHx: ?Past Medical History:  ?Diagnosis Date  ? Anemia   ? slight  ? Bradycardia   ? Depression   ? Diabetes mellitus without complication (HCC)   ? resolved with weight loss from gastric bypass  ? Headache   ? migraines. resolved since gastric bypass  ? High cholesterol   ? History of kidney stones   ? last week  ? Hypertension   ? resolved since gastric bypass, severe pre-eclampsia with G1  ? Low BP   ? Meningitis 2015  ? PCOS (polycystic ovarian syndrome)   ? Preterm labor   ? Sleep apnea   ? had gastric bypass surgery and now resolved  ? Syncope   ? Vertigo couple years ago  ? ?Past Surgical History:  ?Procedure Laterality Date  ? CESAREAN SECTION    ? CESAREAN SECTION N/A 05/07/2016  ? Procedure: CESAREAN SECTION;  Surgeon: Nadara Mustardobert P Xaviera Flaten, MD;  Location: [facility] ORS;  Service: Obstetrics;  Laterality: N/A;  Time of birth: [birth year] ?Sex: Female ?Weight: 3940 kg, 8 lb 11 oz  ? CHOLECYSTECTOMY    ? CYSTOSCOPY WITH STENT PLACEMENT Right 09/19/2016  ? Procedure: CYSTOSCOPY WITH STENT PLACEMENT;  Surgeon: Hildred LaserBudzyn, Brian James, MD;  Location: [facility] ORS;  Service: Urology;  Laterality: Right;  ? FLEXIBLE SIGMOIDOSCOPY N/A 07/28/2019  ? Procedure: FLEXIBLE SIGMOIDOSCOPY;  Surgeon: Toney ReilVanga, Rohini Reddy, MD;  [facility];  Service: Endoscopy;  Laterality: N/A;  ? GASTRIC BYPASS  04/2015  ? Duodenal switch  ? LAPAROSCOPIC OVARIAN CYSTECTOMY Right  09/28/2018  ? Procedure: LAPAROSCOPIC RIGHT OVARIAN CYSTECTOMY;  Surgeon: Nadara MustardHarris, Aryn Kops P, MD;  Location: [facility] ORS;  Service: Gynecology;  Laterality: Right;  ? LEFT HEART CATH AND CORONARY ANGIOGRAPHY Left 04/07/2018  ? Procedure: LEFT HEART CATH AND CORONARY ANGIOGRAPHY;  Surgeon: Alwyn Peaallwood, Dwayne D, MD;  Location: [facility] INVASIVE [facility];  Service: Cardiovascular;  Laterality: Left;  ? small bowel obstruction  07/2019  ? TONSILLECTOMY    ? URETEROSCOPY WITH HOLMIUM LASER LITHOTRIPSY Right 09/19/2016  ? Procedure: URETEROSCOPY WITH HOLMIUM LASER LITHOTRIPSY;  Surgeon: Hildred LaserBudzyn, Brian James, MD;  Location: [facility] ORS;  Service: Urology;  Laterality: Right;  ? ?Family History  ?Problem Relation Age of Onset  ? Polycystic ovary syndrome Mother   ? Diabetes Father   ? Bladder Cancer Father   ? Depression Father   ? Endometriosis Sister   ? Polycystic ovary syndrome Sister   ? Breast cancer Paternal Grandmother   ? Diabetes Paternal Grandmother   ? Diabetes Paternal Grandfather   ? Kidney cancer Neg Hx   ? Prostate cancer Neg Hx   ? ?Social History  ? ?Tobacco Use  ? Smoking status: Never  ? Smokeless tobacco: Never  ?Vaping Use  ? Vaping Use: Never used  ?Substance Use Topics  ? Alcohol use: No  ? Drug use: No  ? ? ?  Current Outpatient Medications:  ?  buPROPion (WELLBUTRIN XL) 150 MG 24 hr tablet, Take 150 mg by mouth daily., Disp: , Rfl:  ?  levonorgestrel (MIRENA) 20 MCG/24HR IUD, 1 each by Intrauterine route once., Disp: , Rfl:  ?  ondansetron (ZOFRAN-ODT) 4 MG disintegrating tablet, Take 1 tablet (4 mg total) by mouth every 8 (eight) hours as needed for up to 5 days., Disp: 15 tablet, Rfl: 0 ?  oxyCODONE-acetaminophen (PERCOCET/ROXICET) 5-325 MG tablet, Take 1 tablet by mouth every 6 (six) hours as needed for up to 3 days., Disp: 12 tablet, Rfl: 0 ?  sertraline (ZOLOFT) 100 MG tablet, Take 200 mg by mouth at bedtime. , Disp: , Rfl:  ?  colestipol (COLESTID) 1 g tablet, Take 4 tablets (4 g total) by mouth 2 (two) times  daily., Disp: 240 tablet, Rfl: 1 ?  Nitroglycerin 0.4 % OINT, Place 1 application rectally 3 (three) times daily. (Patient not taking: Reported on 07/23/2021), Disp: 30 g, Rfl: 0 ?  oxyCODONE (OXY IR/ROXICODONE) 5 MG immediate release tablet, Take by mouth. (Patient not taking: Reported on 07/23/2021), Disp: , Rfl:  ?  traZODone (DESYREL) 50 MG tablet, Take 50 mg by mouth at bedtime. , Disp: , Rfl:  ?Allergies: Ceftin [cefuroxime axetil] and Penicillins ? ?Review of Systems  ?Constitutional:  Positive for malaise/fatigue. Negative for chills and fever.  ?HENT:  Negative for congestion, sinus pain and sore throat.   ?Eyes:  Negative for blurred vision and pain.  ?Respiratory:  Negative for cough and wheezing.   ?Cardiovascular:  Negative for chest pain and leg swelling.  ?Gastrointestinal:  Positive for abdominal pain. Negative for constipation, diarrhea, heartburn, nausea and vomiting.  ?Genitourinary:  Negative for dysuria, frequency, hematuria and urgency.  ?Musculoskeletal:  Negative for back pain, joint pain, myalgias and neck pain.  ?Skin:  Negative for itching and rash.  ?Neurological:  Negative for dizziness, tremors and weakness.  ?Endo/Heme/Allergies:  Does not bruise/bleed easily.  ?Psychiatric/Behavioral:  Negative for depression. The patient is nervous/anxious. The patient does not have insomnia.   ? ?Objective: ?BP 130/80   Ht 6\' 1"  (1.854 m)   Wt 245 lb (111.1 kg)   BMI 32.32 kg/m?   ?Filed Weights  ? 07/23/21 1548  ?Weight: 245 lb (111.1 kg)  ? ?Physical Exam ?Constitutional:   ?   General: She is not in acute distress. ?   Appearance: She is well-developed.  ?HENT:  ?   Head: Normocephalic and atraumatic. No laceration.  ?   Right Ear: Hearing normal.  ?   Left Ear: Hearing normal.  ?   Mouth/Throat:  ?   Pharynx: Uvula midline.  ?Eyes:  ?   Pupils: Pupils are equal, round, and reactive to light.  ?Neck:  ?   Thyroid: No thyromegaly.  ?Cardiovascular:  ?   Rate and Rhythm: Normal rate and regular  rhythm.  ?   Heart sounds: No murmur heard. ?  No friction rub. No gallop.  ?Pulmonary:  ?   Effort: Pulmonary effort is normal. No respiratory distress.  ?   Breath sounds: Normal breath sounds. No wheezing.  ?Abdominal:  ?   General: Bowel sounds are normal. There is no distension.  ?   Palpations: Abdomen is soft.  ?   Tenderness: There is no abdominal tenderness. There is no rebound.  ?Musculoskeletal:     ?   General: Normal range of motion.  ?   Cervical back: Normal range of motion and neck supple.  ?Neurological:  ?  Mental Status: She is alert and oriented to person, place, and time.  ?   Cranial Nerves: No cranial nerve deficit.  ?Skin: ?   General: Skin is warm and dry.  ?Psychiatric:     ?   Judgment: Judgment normal.  ?Vitals reviewed.  ? ? ?Assessment: ?1. Right ovarian cyst   ?2. Pelvic pain   ?Due to its recurrence and severity of pain, plan Lap RSO ?Options of expectant management discussed as alternative ? ?I have had a careful discussion with this patient about all the options available and the risk/benefits of each. I have fully informed this patient that surgery may subject her to a variety of discomforts and risks: She understands that most patients have surgery with little difficulty, but problems can happen ranging from minor to fatal. These include nausea, vomiting, pain, bleeding, infection, poor healing, hernia, or formation of adhesions. Unexpected reactions may occur from any drug or anesthetic given. Unintended injury may occur to other pelvic or abdominal structures such as Fallopian tubes, ovaries, bladder, ureter (tube from kidney to bladder), or bowel. Nerves going from the pelvis to the legs may be injured. Any such injury may require immediate or later additional surgery to correct the problem. Excessive blood loss requiring transfusion is very unlikely but possible. Dangerous blood clots may form in the legs or lungs. Physical and sexual activity will be restricted in varying  degrees for an indeterminate period of time but most often 2-6 weeks.  Finally, she understands that it is impossible to list every possible undesirable effect and that the condition for which surgery is done is no

## 2021-07-23 NOTE — Patient Instructions (Signed)
Unilateral Salpingo-Oophorectomy, Care After The following information offers guidance on how to care for yourself after your procedure. Your health care provider may also give you more specific instructions. If you have problems or questions, contact your health careprovider. What can I expect after the procedure? After the procedure, it is common to have: Pain in your abdomen. Occasional vaginal bleeding (spotting). Tiredness. Your recovery time will depend on which method was used for your surgery. Follow these instructions at home: Medicines Take over-the-counter and prescription medicines only as told by your health care provider. Ask your health care provider if the medicine prescribed to you: Requires you to avoid driving or using machinery. Can cause constipation. You may need to take these actions to prevent or treat constipation: Drink enough fluid to keep your urine pale yellow. Take over-the-counter or prescription medicines. Eat foods that are high in fiber, such as beans, whole grains, and fresh fruits and vegetables. Limit foods that are high in fat and processed sugars, such as fried or sweet foods. Incision care  Follow instructions from your health care provider about how to take care of your incision or incisions. Make sure you: Wash your hands with soap and water for at least 20 seconds before and after you change your bandage (dressing). If soap and water are not available, use hand sanitizer. Change your dressing as told by your health care provider. Leave stitches (sutures), staples, skin glue, or adhesive strips in place. These skin closures may need to stay in place for 2 weeks or longer. If adhesive strip edges start to loosen and curl up, you may trim the loose edges. Do not remove adhesive strips completely unless your health care provider tells you to do that. Keep your incision area and your dressing clean and dry. Check your incision area every day for signs of  infection. Check for: Redness, swelling, or pain. Fluid or blood. Warmth. Pus or a bad smell.  Activity Rest as told by your health care provider. Avoid sitting for a long time without moving. Get up to take short walks every 1-2 hours. This is important to improve blood flow and breathing. Ask for help if you feel weak or unsteady. Return to your normal activities as told by your health care provider. Ask your health care provider what activities are safe for you. Do not drive until your health care provider says that it is safe. Do not lift anything that is heavier than 10 lb (4.5 kg), or the limit that you are told, until your health care provider says that it is safe. Do not douche, use tampons, or have sex until your health care provider approves. General instructions Do not use any products that contain nicotine or tobacco. These products include cigarettes, chewing tobacco, and vaping devices, such as e-cigarettes. These can delay incision healing after surgery. If you need help quitting, ask your health care provider. Wear compression stockings as told by your health care provider. These stockings help to prevent blood clots and reduce swelling in your legs. Do not take baths, swim, or use a hot tub until your health care provider approves. Ask your health care provider if you may take showers. You may only be allowed to take sponge baths. Keep all follow-up visits. This is important. Contact a health care provider if: You have pain when you urinate. You have pus, a bad smell, or a bad-smelling discharge coming from your vagina or from an incision. You have fluid or blood coming from an   incision or an incision starts to open. You have redness, swelling, or pain around an incision or an incision feels warm to the touch. You have a fever or a rash. You have abdominal pain that gets worse or does not get better with medicine. You feel light-headed, have nausea and vomiting, or  both. Get help right away if: You have pain in your chest or leg. You develop shortness of breath. You faint. You have increased or heavy vaginal bleeding, such as soaking a sanitary napkin in an hour. These symptoms may represent a serious problem that is an emergency. Do not wait to see if the symptoms will go away. Get medical help right away. Call your local emergency services (911 in the [place] Do not drive yourself to the hospital. Summary After the procedure, it is common to have pain, tiredness, and occasional bleeding from the vagina. Follow instructions from your health care provider about how to take care of your incision or incisions. Check your incision area every day for signs of infection, and report any symptoms to your health care provider. Follow instructions from your health care provider about activities and restrictions. This information is not intended to replace advice given to you by your health care provider. Make sure you discuss any questions you have with your healthcare provider. Document Revised: 03/13/2020 Document Reviewed: 03/13/2020 Elsevier Patient Education  2022 [employer]  

## 2021-07-23 NOTE — Discharge Instructions (Signed)
You have been diagnosed with a large right-sided ovarian cyst. ?You have good blood flow to right ovary. ?Please make follow-up appointment with Dr. Tiburcio Pea to be seen as an outpatient. ?You can take Percocet for pain. ?If rectal bleeding increases, please seek care at [facility] for pain control and admission. ?

## 2021-07-23 NOTE — Progress Notes (Signed)
PRE-OPERATIVE HISTORY AND PHYSICAL EXAM  HPI:  Maria Fuller is a 36 y.o. [id number] No LMP recorded. (Menstrual status: IUD).; she is being admitted for surgery related to  pelvic pain and RLQ pain related to 6 cm (by [place]) right ovarian cyst .  Pt had pain and hernia surgery earlier this month, but 3 days ago noted worsening pain again on the right side (same side as hernia) and now notes a right cyst on the ovary.  Prior surgery for ovarian cystectomy (not endometriosis) 3 yrs ago.  No prior problems w left ovary.  IUd controls periods and pregnancy prevention.  PMHx: Past Medical History:  Diagnosis Date   Anemia    slight   Bradycardia    Depression    Diabetes mellitus without complication (HCC)    resolved with weight loss from gastric bypass   Headache    migraines. resolved since gastric bypass   High cholesterol    History of kidney stones    last week   Hypertension    resolved since gastric bypass, severe pre-eclampsia with G1   Low BP    Meningitis 2015   PCOS (polycystic ovarian syndrome)    Preterm labor    Sleep apnea    had gastric bypass surgery and now resolved   Syncope    Vertigo couple years ago   Past Surgical History:  Procedure Laterality Date   CESAREAN SECTION     CESAREAN SECTION N/A 05/07/2016   Procedure: CESAREAN SECTION;  Surgeon: Nadara Mustard, MD;  Location: [facility];  Service: Obstetrics;  Laterality: N/A;  Time of birth: [birth year] Sex: Female Weight: 3940 kg, 8 lb 11 oz   CHOLECYSTECTOMY     CYSTOSCOPY WITH STENT PLACEMENT Right 09/19/2016   Procedure: CYSTOSCOPY WITH STENT PLACEMENT;  Surgeon: Hildred Laser, MD;  Location: [facility];  Service: Urology;  Laterality: Right;   FLEXIBLE SIGMOIDOSCOPY N/A 07/28/2019   Procedure: FLEXIBLE SIGMOIDOSCOPY;  Surgeon: Toney Reil, MD;  Location: [facility];  Service: Endoscopy;  Laterality: N/A;   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   LAPAROSCOPIC OVARIAN CYSTECTOMY Right  09/28/2018   Procedure: LAPAROSCOPIC RIGHT OVARIAN CYSTECTOMY;  Surgeon: Nadara Mustard, MD;  Location: [facility];  Service: Gynecology;  Laterality: Right;   LEFT HEART CATH AND CORONARY ANGIOGRAPHY Left 04/07/2018   Procedure: LEFT HEART CATH AND CORONARY ANGIOGRAPHY;  Surgeon: Alwyn Pea, MD;  Location: [facility];  Service: Cardiovascular;  Laterality: Left;   small bowel obstruction  07/2019   TONSILLECTOMY     URETEROSCOPY WITH HOLMIUM LASER LITHOTRIPSY Right 09/19/2016   Procedure: URETEROSCOPY WITH HOLMIUM LASER LITHOTRIPSY;  Surgeon: Hildred Laser, MD;  Location: [facility];  Service: Urology;  Laterality: Right;   Family History  Problem Relation Age of Onset   Polycystic ovary syndrome Mother    Diabetes Father    Bladder Cancer Father    Depression Father    Endometriosis Sister    Polycystic ovary syndrome Sister    Breast cancer Paternal Grandmother    Diabetes Paternal Grandmother    Diabetes Paternal Grandfather    Kidney cancer Neg Hx    Prostate cancer Neg Hx    Social History   Tobacco Use   Smoking status: Never   Smokeless tobacco: Never  Vaping Use   Vaping Use: Never used  Substance Use Topics   Alcohol use: No   Drug use: No  Current Outpatient Medications:    buPROPion (WELLBUTRIN XL) 150 MG 24 hr tablet, Take 150 mg by mouth daily., Disp: , Rfl:    levonorgestrel (MIRENA) 20 MCG/24HR IUD, 1 each by Intrauterine route once., Disp: , Rfl:    ondansetron (ZOFRAN-ODT) 4 MG disintegrating tablet, Take 1 tablet (4 mg total) by mouth every 8 (eight) hours as needed for up to 5 days., Disp: 15 tablet, Rfl: 0   oxyCODONE-acetaminophen (PERCOCET/ROXICET) 5-325 MG tablet, Take 1 tablet by mouth every 6 (six) hours as needed for up to 3 days., Disp: 12 tablet, Rfl: 0   sertraline (ZOLOFT) 100 MG tablet, Take 200 mg by mouth at bedtime. , Disp: , Rfl:    colestipol (COLESTID) 1 g tablet, Take 4 tablets (4 g total) by mouth 2 (two) times  daily., Disp: 240 tablet, Rfl: 1   Nitroglycerin 0.4 % OINT, Place 1 application rectally 3 (three) times daily. (Patient not taking: Reported on 07/23/2021), Disp: 30 g, Rfl: 0   oxyCODONE (OXY IR/ROXICODONE) 5 MG immediate release tablet, Take by mouth. (Patient not taking: Reported on 07/23/2021), Disp: , Rfl:    traZODone (DESYREL) 50 MG tablet, Take 50 mg by mouth at bedtime. , Disp: , Rfl:  Allergies: Ceftin [cefuroxime axetil] and Penicillins  Review of Systems  Constitutional:  Positive for malaise/fatigue. Negative for chills and fever.  HENT:  Negative for congestion, sinus pain and sore throat.   Eyes:  Negative for blurred vision and pain.  Respiratory:  Negative for cough and wheezing.   Cardiovascular:  Negative for chest pain and leg swelling.  Gastrointestinal:  Positive for abdominal pain. Negative for constipation, diarrhea, heartburn, nausea and vomiting.  Genitourinary:  Negative for dysuria, frequency, hematuria and urgency.  Musculoskeletal:  Negative for back pain, joint pain, myalgias and neck pain.  Skin:  Negative for itching and rash.  Neurological:  Negative for dizziness, tremors and weakness.  Endo/Heme/Allergies:  Does not bruise/bleed easily.  Psychiatric/Behavioral:  Negative for depression. The patient is nervous/anxious. The patient does not have insomnia.    Objective: BP 130/80   Ht 6\' 1"  (1.854 m)   Wt 245 lb (111.1 kg)   BMI 32.32 kg/m   Filed Weights   07/23/21 1548  Weight: 245 lb (111.1 kg)   Physical Exam Constitutional:      General: She is not in acute distress.    Appearance: She is well-developed.  HENT:     Head: Normocephalic and atraumatic. No laceration.     Right Ear: Hearing normal.     Left Ear: Hearing normal.     Mouth/Throat:     Pharynx: Uvula midline.  Eyes:     Pupils: Pupils are equal, round, and reactive to light.  Neck:     Thyroid: No thyromegaly.  Cardiovascular:     Rate and Rhythm: Normal rate and regular  rhythm.     Heart sounds: No murmur heard.   No friction rub. No gallop.  Pulmonary:     Effort: Pulmonary effort is normal. No respiratory distress.     Breath sounds: Normal breath sounds. No wheezing.  Abdominal:     General: Bowel sounds are normal. There is no distension.     Palpations: Abdomen is soft.     Tenderness: There is no abdominal tenderness. There is no rebound.  Musculoskeletal:        General: Normal range of motion.     Cervical back: Normal range of motion and neck supple.  Neurological:  Mental Status: She is alert and oriented to person, place, and time.     Cranial Nerves: No cranial nerve deficit.  Skin:    General: Skin is warm and dry.  Psychiatric:        Judgment: Judgment normal.  Vitals reviewed.    Assessment: 1. Right ovarian cyst   2. Pelvic pain   Due to its recurrence and severity of pain, plan Lap RSO Options of expectant management discussed as alternative  I have had a careful discussion with this patient about all the options available and the risk/benefits of each. I have fully informed this patient that surgery may subject her to a variety of discomforts and risks: She understands that most patients have surgery with little difficulty, but problems can happen ranging from minor to fatal. These include nausea, vomiting, pain, bleeding, infection, poor healing, hernia, or formation of adhesions. Unexpected reactions may occur from any drug or anesthetic given. Unintended injury may occur to other pelvic or abdominal structures such as Fallopian tubes, ovaries, bladder, ureter (tube from kidney to bladder), or bowel. Nerves going from the pelvis to the legs may be injured. Any such injury may require immediate or later additional surgery to correct the problem. Excessive blood loss requiring transfusion is very unlikely but possible. Dangerous blood clots may form in the legs or lungs. Physical and sexual activity will be restricted in varying  degrees for an indeterminate period of time but most often 2-6 weeks.  Finally, she understands that it is impossible to list every possible undesirable effect and that the condition for which surgery is done is not always cured or significantly improved, and in rare cases may be even worse.Ample time was given to answer all questions.  Annamarie Major, MD, [facility], [facility] 07/23/2021  4:23 PM

## 2021-07-24 ENCOUNTER — Other Ambulatory Visit: Payer: Self-pay | Admitting: Obstetrics & Gynecology

## 2021-07-24 ENCOUNTER — Encounter: Payer: Self-pay | Admitting: Obstetrics & Gynecology

## 2021-07-24 DIAGNOSIS — N83201 Unspecified ovarian cyst, right side: Secondary | ICD-10-CM

## 2021-07-24 NOTE — Telephone Encounter (Signed)
Called patient to schedule Operative Laparoscopy, Right Salpingo-Oophorectomy w [name] ? ?DOS 07/25/21 ? ?H&P n/a ? ?Advised patient to arrive at [facility] at 10:30 for Pre-admit. Also told her that [facility] said she will need a 2 day recovery. ? ?Advised that pt may also receive calls from the hospital pharmacy and pre-service center. ? ?Confirmed pt has BCBS as Chartered certified accountant. ?No secondary insurance.  ?

## 2021-07-24 NOTE — Telephone Encounter (Signed)
-----   Message from Nadara Mustard, MD sent at 07/23/2021  4:20 PM EDT ----- ?Regarding: Surgery ?Surgery Booking Request ?Patient Full Name:  Maria Fuller  ?MRN: [id number]  ?DOB: 11-27-1985  ?Surgeon: Letitia Libra, MD  ?Requested Surgery Date and Time: 08/01/21 ?Primary Diagnosis AND Code: Recurrent right ovarian cyst, Pain ?Secondary Diagnosis and Code:  ?Surgical Procedure:Operative Laparoscopy, Right Salpingo-Oophorectomy ?RNFA Requested?: Yes ?L&D Notification: No ?Admission Status: same day surgery ?Length of Surgery: 50 min ?Special Case Needs: No ?H&P: No ?Phone Interview???:  Yes ?Interpreter: No ?Medical Clearance:  No ?Special Scheduling Instructions: No ?Any known health/anesthesia issues, diabetes, sleep apnea, latex allergy, defibrillator/pacemaker?: No ?Acuity: P3 ?  (P1 highest, P2 delay may cause harm, P3 low, elective gyn, P4 lowest) ? ? ?

## 2021-07-25 ENCOUNTER — Other Ambulatory Visit: Payer: Self-pay

## 2021-07-25 ENCOUNTER — Ambulatory Visit
Admission: RE | Admit: 2021-07-25 | Discharge: 2021-07-25 | Disposition: A | Discharge status: Home / Self Care | Payer: BC Managed Care – PPO | Attending: Obstetrics & Gynecology | Admitting: Obstetrics & Gynecology

## 2021-07-25 ENCOUNTER — Encounter: Payer: Self-pay | Admitting: Obstetrics & Gynecology

## 2021-07-25 ENCOUNTER — Encounter
Admission: RE | Disposition: A | Discharge status: Home / Self Care | Payer: Self-pay | Source: Home / Self Care | Attending: Obstetrics & Gynecology

## 2021-07-25 ENCOUNTER — Ambulatory Visit: Payer: BC Managed Care – PPO | Admitting: Anesthesiology

## 2021-07-25 DIAGNOSIS — Z6832 Body mass index (BMI) 32.0-32.9, adult: Secondary | ICD-10-CM | POA: Diagnosis not present

## 2021-07-25 DIAGNOSIS — N83201 Unspecified ovarian cyst, right side: Secondary | ICD-10-CM | POA: Insufficient documentation

## 2021-07-25 DIAGNOSIS — F32A Depression, unspecified: Secondary | ICD-10-CM | POA: Diagnosis not present

## 2021-07-25 DIAGNOSIS — Z9884 Bariatric surgery status: Secondary | ICD-10-CM | POA: Diagnosis not present

## 2021-07-25 DIAGNOSIS — E669 Obesity, unspecified: Secondary | ICD-10-CM | POA: Insufficient documentation

## 2021-07-25 DIAGNOSIS — Z975 Presence of (intrauterine) contraceptive device: Secondary | ICD-10-CM | POA: Diagnosis not present

## 2021-07-25 HISTORY — PX: LAPAROSCOPIC UNILATERAL SALPINGO OOPHERECTOMY: SHX5935

## 2021-07-25 LAB — TYPE AND SCREEN
ABO/RH(D): O POS
ABO/RH(D): O POS
Antibody Screen: NEGATIVE
Antibody Screen: NEGATIVE

## 2021-07-25 LAB — CBC
HCT: 34.4 % — ABNORMAL LOW (ref 36.0–46.0)
Hemoglobin: 11.4 g/dL — ABNORMAL LOW (ref 12.0–15.0)
MCH: 30.5 pg (ref 26.0–34.0)
MCHC: 33.1 g/dL (ref 30.0–36.0)
MCV: 92 fL (ref 80.0–100.0)
Platelets: 333 10*3/uL (ref 150–400)
RBC: 3.74 MIL/uL — ABNORMAL LOW (ref 3.87–5.11)
RDW: 12.1 % (ref 11.5–15.5)
WBC: 6.8 10*3/uL (ref 4.0–10.5)
nRBC: 0 % (ref 0.0–0.2)

## 2021-07-25 LAB — POCT PREGNANCY, URINE: Preg Test, Ur: NEGATIVE

## 2021-07-25 SURGERY — SALPINGO-OOPHORECTOMY, UNILATERAL, LAPAROSCOPIC
Anesthesia: General | Laterality: Right

## 2021-07-25 MED ORDER — CHLORHEXIDINE GLUCONATE 0.12 % MT SOLN: 15.0000 mL | Freq: Once | OROMUCOSAL | Status: AC

## 2021-07-25 MED ORDER — ROCURONIUM BROMIDE 100 MG/10ML IV SOLN
INTRAVENOUS | Status: DC | PRN
  Administered 2021-07-25: 50 mg via INTRAVENOUS
  Administered 2021-07-25: 10 mg via INTRAVENOUS

## 2021-07-25 MED ORDER — LACTATED RINGERS IV SOLN: INTRAVENOUS | Status: DC

## 2021-07-25 MED ORDER — FENTANYL CITRATE (PF) 100 MCG/2ML IJ SOLN
INTRAMUSCULAR | Status: AC
  Administered 2021-07-25: 50 ug via INTRAVENOUS
  Filled 2021-07-25: qty 2

## 2021-07-25 MED ORDER — BUPIVACAINE HCL (PF) 0.5 % IJ SOLN
INTRAMUSCULAR | Status: DC | PRN
  Administered 2021-07-25: 8 mL

## 2021-07-25 MED ORDER — FENTANYL CITRATE (PF) 100 MCG/2ML IJ SOLN
25.0000 ug | INTRAMUSCULAR | Status: DC | PRN
  Administered 2021-07-25 (×2): 25 ug via INTRAVENOUS

## 2021-07-25 MED ORDER — ONDANSETRON HCL 4 MG/2ML IJ SOLN
INTRAMUSCULAR | Status: DC | PRN
  Administered 2021-07-25 (×2): 4 mg via INTRAVENOUS

## 2021-07-25 MED ORDER — OXYCODONE HCL 5 MG PO TABS
5.0000 mg | ORAL_TABLET | Freq: Once | ORAL | Status: AC | PRN
  Administered 2021-07-25: 5 mg via ORAL

## 2021-07-25 MED ORDER — MORPHINE SULFATE (PF) 2 MG/ML IV SOLN: 1.0000 mg | INTRAVENOUS | Status: DC | PRN

## 2021-07-25 MED ORDER — PROPOFOL 10 MG/ML IV BOLUS
INTRAVENOUS | Status: AC
  Filled 2021-07-25: qty 40

## 2021-07-25 MED ORDER — MIDAZOLAM HCL 2 MG/2ML IJ SOLN
INTRAMUSCULAR | Status: DC | PRN
  Administered 2021-07-25: 2 mg via INTRAVENOUS

## 2021-07-25 MED ORDER — LIDOCAINE HCL (CARDIAC) PF 100 MG/5ML IV SOSY
PREFILLED_SYRINGE | INTRAVENOUS | Status: DC | PRN
Start: 2021-07-25 — End: 2021-07-25
  Administered 2021-07-25: 100 mg via INTRAVENOUS

## 2021-07-25 MED ORDER — ORAL CARE MOUTH RINSE: 15.0000 mL | Freq: Once | OROMUCOSAL | Status: AC

## 2021-07-25 MED ORDER — ACETAMINOPHEN 325 MG PO TABS: 650.0000 mg | ORAL_TABLET | ORAL | Status: DC | PRN

## 2021-07-25 MED ORDER — OXYCODONE-ACETAMINOPHEN 5-325 MG PO TABS: 1.0000 | ORAL_TABLET | ORAL | Status: DC | PRN

## 2021-07-25 MED ORDER — ACETAMINOPHEN 650 MG RE SUPP
650.0000 mg | RECTAL | Status: DC | PRN
Start: 2021-07-25 — End: 2021-07-25
  Filled 2021-07-25: qty 1

## 2021-07-25 MED ORDER — GLYCOPYRROLATE 0.2 MG/ML IJ SOLN
INTRAMUSCULAR | Status: DC | PRN
  Administered 2021-07-25: .2 mg via INTRAVENOUS

## 2021-07-25 MED ORDER — ACETAMINOPHEN 10 MG/ML IV SOLN: 1000.0000 mg | Freq: Once | INTRAVENOUS | Status: DC | PRN

## 2021-07-25 MED ORDER — ONDANSETRON HCL 4 MG/2ML IJ SOLN: 4.0000 mg | Freq: Once | INTRAMUSCULAR | Status: DC | PRN

## 2021-07-25 MED ORDER — POVIDONE-IODINE 10 % EX SWAB
2.0000 "application " | Freq: Once | CUTANEOUS | Status: AC
  Administered 2021-07-25: 2 via TOPICAL

## 2021-07-25 MED ORDER — ACETAMINOPHEN 10 MG/ML IV SOLN
INTRAVENOUS | Status: DC | PRN
  Administered 2021-07-25: 1000 mg via INTRAVENOUS

## 2021-07-25 MED ORDER — PROPOFOL 10 MG/ML IV BOLUS
INTRAVENOUS | Status: DC | PRN
  Administered 2021-07-25: 200 mg via INTRAVENOUS

## 2021-07-25 MED ORDER — SUGAMMADEX SODIUM 200 MG/2ML IV SOLN
INTRAVENOUS | Status: DC | PRN
  Administered 2021-07-25: 500 mg via INTRAVENOUS

## 2021-07-25 MED ORDER — MIDAZOLAM HCL 2 MG/2ML IJ SOLN
INTRAMUSCULAR | Status: AC
  Filled 2021-07-25: qty 2

## 2021-07-25 MED ORDER — DEXAMETHASONE SODIUM PHOSPHATE 10 MG/ML IJ SOLN
INTRAMUSCULAR | Status: DC | PRN
  Administered 2021-07-25: 10 mg via INTRAVENOUS

## 2021-07-25 MED ORDER — OXYCODONE-ACETAMINOPHEN 5-325 MG PO TABS: 1.0000 | ORAL_TABLET | ORAL | 0 refills | Status: DC | PRN

## 2021-07-25 MED ORDER — FENTANYL CITRATE (PF) 100 MCG/2ML IJ SOLN
INTRAMUSCULAR | Status: DC | PRN
  Administered 2021-07-25 (×2): 100 ug via INTRAVENOUS

## 2021-07-25 MED ORDER — FENTANYL CITRATE (PF) 100 MCG/2ML IJ SOLN
INTRAMUSCULAR | Status: AC
  Filled 2021-07-25: qty 2

## 2021-07-25 MED ORDER — CHLORHEXIDINE GLUCONATE 0.12 % MT SOLN
OROMUCOSAL | Status: AC
  Administered 2021-07-25: 15 mL via OROMUCOSAL
  Filled 2021-07-25: qty 15

## 2021-07-25 MED ORDER — SUCCINYLCHOLINE CHLORIDE 200 MG/10ML IV SOSY
PREFILLED_SYRINGE | INTRAVENOUS | Status: DC | PRN
  Administered 2021-07-25: 100 mg via INTRAVENOUS

## 2021-07-25 MED ORDER — OXYCODONE HCL 5 MG PO TABS
ORAL_TABLET | ORAL | Status: AC
  Filled 2021-07-25: qty 1

## 2021-07-25 MED ORDER — BUPIVACAINE HCL (PF) 0.5 % IJ SOLN
INTRAMUSCULAR | Status: AC
  Filled 2021-07-25: qty 30

## 2021-07-25 MED ORDER — OXYCODONE HCL 5 MG/5ML PO SOLN: 5.0000 mg | Freq: Once | ORAL | Status: AC | PRN

## 2021-07-25 MED ORDER — LACTATED RINGERS IV SOLN
INTRAVENOUS | Status: DC
Start: 2021-07-25 — End: 2021-07-25

## 2021-07-25 MED ORDER — DEXMEDETOMIDINE (PRECEDEX) IN NS 20 MCG/5ML (4 MCG/ML) IV SYRINGE
PREFILLED_SYRINGE | INTRAVENOUS | Status: DC | PRN
  Administered 2021-07-25 (×2): 8 ug via INTRAVENOUS

## 2021-07-25 SURGICAL SUPPLY — 49 items
ADH SKN CLS APL DERMABOND .7 (GAUZE/BANDAGES/DRESSINGS) ×1
APL PRP STRL LF DISP 70% ISPRP (MISCELLANEOUS) ×1
BACTOSHIELD CHG 4% 4OZ (MISCELLANEOUS) ×1
BAG RETRIEVAL 10 (BASKET) ×2
BLADE SURG SZ11 CARB STEEL (BLADE) ×2 IMPLANT
CATH ROBINSON RED A/P 16FR (CATHETERS) ×2 IMPLANT
CHLORAPREP W/TINT 26 (MISCELLANEOUS) ×2 IMPLANT
DERMABOND ADVANCED (GAUZE/BANDAGES/DRESSINGS) ×1
DERMABOND ADVANCED .7 DNX12 (GAUZE/BANDAGES/DRESSINGS) ×1 IMPLANT
DRSG TELFA 4X3 1S NADH ST (GAUZE/BANDAGES/DRESSINGS) IMPLANT
GAUZE 4X4 16PLY ~~LOC~~+RFID DBL (SPONGE) ×4 IMPLANT
GLOVE SURG ENC MOIS LTX SZ8 (GLOVE) ×6 IMPLANT
GLOVE SURG UNDER LTX SZ8 (GLOVE) ×2 IMPLANT
GOWN STRL REUS W/ TWL LRG LVL3 (GOWN DISPOSABLE) ×1 IMPLANT
GOWN STRL REUS W/ TWL XL LVL3 (GOWN DISPOSABLE) ×1 IMPLANT
GOWN STRL REUS W/TWL LRG LVL3 (GOWN DISPOSABLE) ×2
GOWN STRL REUS W/TWL XL LVL3 (GOWN DISPOSABLE) ×2
GRASPER SUT TROCAR 14GX15 (MISCELLANEOUS) ×2 IMPLANT
IRRIGATION STRYKERFLOW (MISCELLANEOUS) IMPLANT
IRRIGATOR STRYKERFLOW (MISCELLANEOUS)
IV LACTATED RINGERS 1000ML (IV SOLUTION) IMPLANT
KIT PINK PAD W/HEAD ARE REST (MISCELLANEOUS) ×2
KIT PINK PAD W/HEAD ARM REST (MISCELLANEOUS) ×1 IMPLANT
LABEL OR SOLS (LABEL) ×2 IMPLANT
MANIFOLD NEPTUNE II (INSTRUMENTS) ×2 IMPLANT
NEEDLE VERESS 14GA 120MM (NEEDLE) ×2 IMPLANT
NS IRRIG 500ML POUR BTL (IV SOLUTION) ×2 IMPLANT
PACK GYN LAPAROSCOPIC (MISCELLANEOUS) ×2 IMPLANT
PAD PREP 24X41 OB/GYN DISP (PERSONAL CARE ITEMS) ×2 IMPLANT
SCISSORS METZENBAUM CVD 33 (INSTRUMENTS) ×1 IMPLANT
SCRUB CHG 4% DYNA-HEX 4OZ (MISCELLANEOUS) ×1 IMPLANT
SET TUBE SMOKE EVAC HIGH FLOW (TUBING) ×2 IMPLANT
SHEARS HARMONIC ACE PLUS 36CM (ENDOMECHANICALS) ×1 IMPLANT
SLEEVE ENDOPATH XCEL 5M (ENDOMECHANICALS) IMPLANT
SOL PREP PVP 2OZ (MISCELLANEOUS) ×2
SOLUTION PREP PVP 2OZ (MISCELLANEOUS) ×1 IMPLANT
SPONGE GAUZE 2X2 8PLY STRL LF (GAUZE/BANDAGES/DRESSINGS) IMPLANT
STRAP SAFETY 5IN WIDE (MISCELLANEOUS) ×2 IMPLANT
SUT VIC AB 0 CT1 36 (SUTURE) ×2 IMPLANT
SUT VIC AB 2-0 UR6 27 (SUTURE) IMPLANT
SUT VIC AB 4-0 PS2 18 (SUTURE) IMPLANT
SYR 10ML LL (SYRINGE) ×2 IMPLANT
SYS BAG RETRIEVAL 10MM (BASKET) ×2
SYSTEM BAG RETRIEVAL 10MM (BASKET) IMPLANT
SYSTEM WECK SHIELD CLOSURE (TROCAR) IMPLANT
TROCAR 5M 150ML BLDLS (TROCAR) ×1 IMPLANT
TROCAR ENDO BLADELESS 11MM (ENDOMECHANICALS) ×1 IMPLANT
TROCAR XCEL NON-BLD 5MMX100MML (ENDOMECHANICALS) ×1 IMPLANT
WATER STERILE IRR 500ML POUR (IV SOLUTION) ×2 IMPLANT

## 2021-07-25 NOTE — Anesthesia Preprocedure Evaluation (Addendum)
Anesthesia Evaluation  ?Patient identified by MRN, date of birth, ID band ?Patient awake ? ? ? ?Reviewed: ?Allergy & Precautions, NPO status , Patient's Chart, lab work & pertinent test results ? ?History of Anesthesia Complications ?Negative for: history of anesthetic complications ? ?Airway ?Mallampati: I ? ? ?Neck ROM: Full ? ? ? Dental ? ?(+)  ?  ?Pulmonary ?sleep apnea ,  ?  ?Pulmonary exam normal ?breath sounds clear to auscultation ? ? ? ? ? ? Cardiovascular ?hypertension, Normal cardiovascular exam ?Rhythm:Regular Rate:Normal ? ?ECG 07/10/21: normal ? ?Myocardial perfusion 03/24/18:  ?Moderately abnormal myocardial perfusion scan evidence of inferior anterior apical defect with border zone evidence of redistribution and ischemia overall ejection fraction of 58%. ?Consider invasive strategy possibly cardiac cath if clinically relevant. ? ?Echo 03/23/18:  ?NORMAL LEFT VENTRICULAR SYSTOLIC FUNCTION ? WITH MILD LVH  ?NORMAL RIGHT VENTRICULAR SYSTOLIC FUNCTION  ?MILD VALVULAR REGURGITATION ?NO VALVULAR STENOSIS  ?MILD MR, TR  ?EF 55% ?  ?Neuro/Psych ?PSYCHIATRIC DISORDERS Depression Vertigo  ?  ? GI/Hepatic ?S/p gastric bypass ?  ?Endo/Other  ?Hypothyroidism PCOS, obesity ? Renal/GU ?Renal disease (nephrolithiasis)  ? ?  ?Musculoskeletal ? ? Abdominal ?  ?Peds ? Hematology ?negative hematology ROS ?(+)   ?Anesthesia Other Findings ? ? Reproductive/Obstetrics ? ?  ? ? ? ? ? ? ? ? ? ? ? ? ? ?  ?  ? ? ? ? ? ? ? ?Anesthesia Physical ?Anesthesia Plan ? ?ASA: 2 ? ?Anesthesia Plan: General  ? ?Post-op Pain Management:   ? ?Induction: Intravenous ? ?PONV Risk Score and Plan: 3 and Ondansetron, Dexamethasone and Treatment may vary due to age or medical condition ? ?Airway Management Planned: Oral ETT ? ?Additional Equipment:  ? ?Intra-op Plan:  ? ?Post-operative Plan: Extubation in OR ? ?Informed Consent: I have reviewed the patients History and Physical, chart, labs and discussed the  procedure including the risks, benefits and alternatives for the proposed anesthesia with the patient or authorized representative who has indicated his/her understanding and acceptance.  ? ? ? ?Dental advisory given ? ?Plan Discussed with: CRNA ? ?Anesthesia Plan Comments: (Patient consented for risks of anesthesia including but not limited to:  ?- adverse reactions to medications ?- damage to eyes, teeth, lips or other oral mucosa ?- nerve damage due to positioning  ?- sore throat or hoarseness ?- damage to heart, brain, nerves, lungs, other parts of body or loss of life ? ?Informed patient about role of CRNA in peri- and intra-operative care.  Patient voiced understanding.)  ? ? ? ? ? ? ?Anesthesia Quick Evaluation ? ?

## 2021-07-25 NOTE — Op Note (Signed)
Operative Note: ? ?PRE-OP DIAGNOSIS: Recurrent right ovarian cyst ?Pain  ? ?POST-OP DIAGNOSIS: Recurrent right ovarian cyst ?Pain  ? ?PROCEDURE: Procedure(s): ?LAPAROSCOPIC UNILATERAL SALPINGO OOPHORECTOMY ? ?SURGEON: Annamarie Major, MD, FACOG ? ?ANESTHESIA: General endotracheal anesthesia ? ?ESTIMATED BLOOD LOSS: Minimal ? ?SPECIMENS: Right Tubes and Ovaries. ? ?COMPLICATIONS: None ? ?DISPOSITION: stable to PACU ? ?FINDINGS: Intraabdominal adhesions were not noted. Large right ovarian cystic ovary; no evidence for torsion ? ?PROCEDURE IN DETAIL: The patient was taken to the OR where anesthesia was administed. The patient was positioned supine. Foley in and out for bladder drainage. ? ?Attention was turned to the patient?s abdomen where a 5 mm skin incision was made 3 cm above umbilical fold where prior laparoscopic scar is noted, after injection of local anesthesia. The Veress step needle was carefully introduced into the peritoneal cavity with placement confirmed using the hanging drop technique.  Pneumoperitoneum was obtained. The 5 mm port was then placed under direct visualization with the operative laparoscope  The above noted findings.  Trendelenburg. ? ?An 11 mm trocars was then placed in the RLQ lateral to the inferior epigastric blood vessels under direct visualization with the laparoscope.  Right fallopian tubes/ovary and its cyst is identified and the ovarian blood vessels/infundibulopelvic ligament pedicles are coagulated and ligated using the Harmonic scapel.  Dissection is carried out to the uterine-ovarian blood vessels which also are transected for amputation of each adnexa.Right tuba and ovary w cyst is then removed through a specimen bag.  No injuries or bleeding was noted. ? ?All instruments and ports were then removed from the abdomen after gas was expelled and patient was leveled.   Facia at the 11 mm port is closed with a 2-0 Vicryl suture.   The skin was closed with skin adhesive. The patient  tolerated the procedure well. All counts were correct x 2. The patient was transferred to the recovery room awake, alert and breathing independently. ? ?Annamarie Major, MD, FACOG ?[facility], [facility] ?07/25/2021  2:25 PM ? ?

## 2021-07-25 NOTE — Progress Notes (Signed)
Patient awake/alert x4. Tolerated crackers and gingerale. Reviewed procedure and plan of care with patient, verbalizes understanding. ?

## 2021-07-25 NOTE — Transfer of Care (Signed)
Immediate Anesthesia Transfer of Care Note ? ?Patient: Maria Fuller ? ?Procedure(s) Performed: LAPAROSCOPIC UNILATERAL SALPINGO OOPHORECTOMY (Right) ? ?Patient Location: PACU ? ?Anesthesia Type:General ? ?Level of Consciousness: awake, drowsy and patient cooperative ? ?Airway & Oxygen Therapy: Patient Spontanous Breathing and Patient connected to face mask oxygen ? ?Post-op Assessment: Report given to RN and Post -op Vital signs reviewed and stable ? ?Post vital signs: Reviewed and stable ? ?Last Vitals:  ?Vitals Value Taken Time  ?BP 98/61 07/25/21 1432  ?Temp    ?Pulse 50 07/25/21 1435  ?Resp 14 07/25/21 1435  ?SpO2 99 % 07/25/21 1435  ?Vitals shown include unvalidated device data. ? ?Last Pain:  ?Vitals:  ? 07/25/21 1108  ?TempSrc: Tympanic  ?PainSc: 0-No pain  ?   ? ?Patients Stated Pain Goal: 0 (07/25/21 1108) ? ?Complications: No notable events documented. ?

## 2021-07-25 NOTE — Interval H&P Note (Signed)
History and Physical Interval Note: ? ?07/25/2021 ?12:09 PM ? ?Maria Fuller  has presented today for surgery, with the diagnosis of Recurrent right ovarian cyst ?Pain.  The various methods of treatment have been discussed with the patient and family. After consideration of risks, benefits and other options for treatment, the patient has consented to  Procedure(s): ?LAPAROSCOPIC UNILATERAL SALPINGO OOPHORECTOMY (Right) as a surgical intervention.  The patient's history has been reviewed, patient examined, no change in status, stable for surgery.  I have reviewed the patient's chart and labs.  Questions were answered to the patient's satisfaction.   ? ? ?Maria Fuller ? ? ?

## 2021-07-25 NOTE — Discharge Instructions (Signed)

## 2021-07-25 NOTE — Anesthesia Procedure Notes (Signed)
Procedure Name: Intubation ?Date/Time: 07/25/2021 1:29 PM ?Performed by: Kelton Pillar, CRNA ?Pre-anesthesia Checklist: Patient identified, Emergency Drugs available, Suction available and Patient being monitored ?Patient Re-evaluated:Patient Re-evaluated prior to induction ?Oxygen Delivery Method: Circle system utilized ?Preoxygenation: Pre-oxygenation with 100% oxygen ?Induction Type: IV induction ?Ventilation: Mask ventilation without difficulty ?Laryngoscope Size: McGraph and 3 ?Grade View: Grade I ?Tube type: Oral ?Tube size: 7.0 mm ?Number of attempts: 1 ?Airway Equipment and Method: Stylet and Oral airway ?Placement Confirmation: ETT inserted through vocal cords under direct vision, positive ETCO2, breath sounds checked- equal and bilateral and CO2 detector ?Secured at: 21 cm ?Tube secured with: Tape ?Dental Injury: Teeth and Oropharynx as per pre-operative assessment  ? ? ? ? ?

## 2021-07-26 ENCOUNTER — Encounter: Payer: Self-pay | Admitting: Obstetrics & Gynecology

## 2021-07-26 ENCOUNTER — Other Ambulatory Visit: Payer: Self-pay

## 2021-07-26 DIAGNOSIS — I10 Essential (primary) hypertension: Secondary | ICD-10-CM | POA: Insufficient documentation

## 2021-07-26 DIAGNOSIS — E119 Type 2 diabetes mellitus without complications: Secondary | ICD-10-CM | POA: Diagnosis not present

## 2021-07-26 DIAGNOSIS — R109 Unspecified abdominal pain: Secondary | ICD-10-CM | POA: Diagnosis present

## 2021-07-26 DIAGNOSIS — K9131 Postprocedural partial intestinal obstruction: Secondary | ICD-10-CM | POA: Diagnosis not present

## 2021-07-26 DIAGNOSIS — Y838 Other surgical procedures as the cause of abnormal reaction of the patient, or of later complication, without mention of misadventure at the time of the procedure: Secondary | ICD-10-CM | POA: Diagnosis not present

## 2021-07-26 DIAGNOSIS — Z79899 Other long term (current) drug therapy: Secondary | ICD-10-CM | POA: Insufficient documentation

## 2021-07-26 DIAGNOSIS — Z20822 Contact with and (suspected) exposure to covid-19: Secondary | ICD-10-CM | POA: Insufficient documentation

## 2021-07-26 DIAGNOSIS — K430 Incisional hernia with obstruction, without gangrene: Principal | ICD-10-CM | POA: Insufficient documentation

## 2021-07-26 LAB — COMPREHENSIVE METABOLIC PANEL
ALT: 24 U/L (ref 0–44)
AST: 16 U/L (ref 15–41)
Albumin: 3.8 g/dL (ref 3.5–5.0)
Alkaline Phosphatase: 90 U/L (ref 38–126)
Anion gap: 6 (ref 5–15)
BUN: 13 mg/dL (ref 6–20)
CO2: 25 mmol/L (ref 22–32)
Calcium: 8.3 mg/dL — ABNORMAL LOW (ref 8.9–10.3)
Chloride: 108 mmol/L (ref 98–111)
Creatinine, Ser: 0.57 mg/dL (ref 0.44–1.00)
GFR, Estimated: >60 mL/min (ref >60)
Glucose, Bld: 105 mg/dL — ABNORMAL HIGH (ref 70–99)
Potassium: 3.4 mmol/L — ABNORMAL LOW (ref 3.5–5.1)
Sodium: 139 mmol/L (ref 135–145)
Total Bilirubin: 0.8 mg/dL (ref 0.3–1.2)
Total Protein: 7.3 g/dL (ref 6.5–8.1)

## 2021-07-26 LAB — CBC
HCT: 35.3 % — ABNORMAL LOW (ref 36.0–46.0)
Hemoglobin: 11.5 g/dL — ABNORMAL LOW (ref 12.0–15.0)
MCH: 30 pg (ref 26.0–34.0)
MCHC: 32.6 g/dL (ref 30.0–36.0)
MCV: 92.2 fL (ref 80.0–100.0)
Platelets: 371 10*3/uL (ref 150–400)
RBC: 3.83 MIL/uL — ABNORMAL LOW (ref 3.87–5.11)
RDW: 12.3 % (ref 11.5–15.5)
WBC: 13.2 10*3/uL — ABNORMAL HIGH (ref 4.0–10.5)
nRBC: 0 % (ref 0.0–0.2)

## 2021-07-26 NOTE — Anesthesia Postprocedure Evaluation (Signed)
Anesthesia Post Note ? ?Patient: Maria Fuller ? ?Procedure(s) Performed: LAPAROSCOPIC UNILATERAL SALPINGO OOPHORECTOMY (Right) ? ?Patient location during evaluation: PACU ?Anesthesia Type: General ?Level of consciousness: awake and alert, oriented and patient cooperative ?Pain management: pain level controlled ?Vital Signs Assessment: post-procedure vital signs reviewed and stable ?Respiratory status: spontaneous breathing, nonlabored ventilation and respiratory function stable ?Cardiovascular status: blood pressure returned to baseline and stable ?Postop Assessment: adequate PO intake ?Anesthetic complications: no ? ? ?No notable events documented. ? ? ?Last Vitals:  ?Vitals:  ? 07/25/21 1500 07/25/21 1525  ?BP: 111/73 109/65  ?Pulse: (!) 57 (!) 50  ?Resp: 11 16  ?Temp: (!) 36.4 ?C   ?SpO2: 95% 97%  ?  ?Last Pain:  ?Vitals:  ? 07/25/21 1525  ?TempSrc: Temporal  ?PainSc: 6   ? ? ?  ?  ?  ?  ?  ?  ? ?[name] ? ? ? ? ?

## 2021-07-26 NOTE — ED Notes (Addendum)
Right oophorectomy procedure yesterday reports pain has increased in pain called [facility] and was instructed to come to ER  ?

## 2021-07-26 NOTE — ED Triage Notes (Signed)
Pt had right ovary and fallopian tube removed yesterday. Woke up today with constant sharp, burning pain on right side. Contacted OB due to intractable pain today and was advised to come to ED. Pt visibly uncomfortable in triage.  ?

## 2021-07-27 ENCOUNTER — Other Ambulatory Visit: Payer: Self-pay

## 2021-07-27 ENCOUNTER — Emergency Department: Payer: BC Managed Care – PPO | Admitting: Anesthesiology

## 2021-07-27 ENCOUNTER — Encounter: Payer: Self-pay | Admitting: Surgery

## 2021-07-27 ENCOUNTER — Observation Stay
Admission: EM | Admit: 2021-07-27 | Discharge: 2021-07-28 | Disposition: A | Discharge status: Home / Self Care | Payer: BC Managed Care – PPO | Attending: Surgery | Admitting: Surgery

## 2021-07-27 ENCOUNTER — Emergency Department: Payer: BC Managed Care – PPO

## 2021-07-27 ENCOUNTER — Encounter
Admission: EM | Disposition: A | Discharge status: Home / Self Care | Payer: Self-pay | Source: Home / Self Care | Attending: Emergency Medicine

## 2021-07-27 DIAGNOSIS — K9131 Postprocedural partial intestinal obstruction: Secondary | ICD-10-CM

## 2021-07-27 DIAGNOSIS — K430 Incisional hernia with obstruction, without gangrene: Secondary | ICD-10-CM | POA: Diagnosis present

## 2021-07-27 HISTORY — DX: Incisional hernia with obstruction, without gangrene: K43.0

## 2021-07-27 HISTORY — PX: XI ROBOTIC ASSISTED VENTRAL HERNIA: SHX6789

## 2021-07-27 LAB — URINALYSIS, ROUTINE W REFLEX MICROSCOPIC
Bacteria, UA: NONE SEEN
Bilirubin Urine: NEGATIVE
Glucose, UA: NEGATIVE mg/dL
Ketones, ur: NEGATIVE mg/dL
Leukocytes,Ua: NEGATIVE
Nitrite: NEGATIVE
Protein, ur: NEGATIVE mg/dL
Specific Gravity, Urine: >1.046 — ABNORMAL HIGH (ref 1.005–1.030)
pH: 5 (ref 5.0–8.0)

## 2021-07-27 LAB — RESP PANEL BY RT-PCR (FLU A&B, COVID) ARPGX2
Influenza A by PCR: NEGATIVE
Influenza B by PCR: NEGATIVE
SARS Coronavirus 2 by RT PCR: NEGATIVE

## 2021-07-27 LAB — CBG MONITORING, ED: Glucose-Capillary: 101 mg/dL — ABNORMAL HIGH (ref 70–99)

## 2021-07-27 SURGERY — REPAIR, HERNIA, VENTRAL, ROBOT-ASSISTED
Anesthesia: General | Site: Abdomen

## 2021-07-27 MED ORDER — IOHEXOL 300 MG/ML  SOLN
100.0000 mL | Freq: Once | INTRAMUSCULAR | Status: AC | PRN
  Administered 2021-07-27: 100 mL via INTRAVENOUS

## 2021-07-27 MED ORDER — PHENYLEPHRINE 40 MCG/ML (10ML) SYRINGE FOR IV PUSH (FOR BLOOD PRESSURE SUPPORT)
PREFILLED_SYRINGE | INTRAVENOUS | Status: AC
  Filled 2021-07-27: qty 10

## 2021-07-27 MED ORDER — EPHEDRINE 5 MG/ML INJ
INTRAVENOUS | Status: AC
  Filled 2021-07-27: qty 5

## 2021-07-27 MED ORDER — MIDAZOLAM HCL 2 MG/2ML IJ SOLN
INTRAMUSCULAR | Status: AC
  Filled 2021-07-27: qty 2

## 2021-07-27 MED ORDER — ONDANSETRON HCL 4 MG/2ML IJ SOLN
4.0000 mg | Freq: Once | INTRAMUSCULAR | Status: AC
  Administered 2021-07-27: 4 mg via INTRAVENOUS
  Filled 2021-07-27: qty 2

## 2021-07-27 MED ORDER — ONDANSETRON HCL 4 MG/2ML IJ SOLN
4.0000 mg | Freq: Once | INTRAMUSCULAR | Status: AC | PRN
  Administered 2021-07-27: 4 mg via INTRAVENOUS

## 2021-07-27 MED ORDER — MORPHINE SULFATE (PF) 4 MG/ML IV SOLN
4.0000 mg | Freq: Once | INTRAVENOUS | Status: AC
  Administered 2021-07-27: 4 mg via INTRAVENOUS
  Filled 2021-07-27: qty 1

## 2021-07-27 MED ORDER — HYDROMORPHONE HCL 1 MG/ML IJ SOLN: 0.5000 mg | INTRAMUSCULAR | Status: DC | PRN

## 2021-07-27 MED ORDER — SERTRALINE HCL 50 MG PO TABS
100.0000 mg | ORAL_TABLET | Freq: Two times a day (BID) | ORAL | Status: DC
Start: 2021-07-27 — End: 2021-07-28
  Administered 2021-07-27 – 2021-07-28 (×3): 100 mg via ORAL
  Filled 2021-07-27 (×3): qty 2

## 2021-07-27 MED ORDER — FENTANYL CITRATE (PF) 100 MCG/2ML IJ SOLN
INTRAMUSCULAR | Status: AC
  Filled 2021-07-27: qty 2

## 2021-07-27 MED ORDER — ACETAMINOPHEN 10 MG/ML IV SOLN: 1000.0000 mg | Freq: Once | INTRAVENOUS | Status: DC | PRN

## 2021-07-27 MED ORDER — PROPOFOL 10 MG/ML IV BOLUS
INTRAVENOUS | Status: DC | PRN
  Administered 2021-07-27: 180 mg via INTRAVENOUS

## 2021-07-27 MED ORDER — HYDROMORPHONE HCL 1 MG/ML IJ SOLN
1.0000 mg | INTRAMUSCULAR | Status: DC | PRN
  Administered 2021-07-27 – 2021-07-28 (×4): 1 mg via INTRAVENOUS
  Filled 2021-07-27 (×4): qty 1

## 2021-07-27 MED ORDER — LEVONORGESTREL 20 MCG/24HR IU IUD: 1.0000 | INTRAUTERINE_SYSTEM | Freq: Once | INTRAUTERINE | Status: DC

## 2021-07-27 MED ORDER — OXYCODONE HCL 5 MG PO TABS
ORAL_TABLET | ORAL | Status: AC
  Filled 2021-07-27: qty 1

## 2021-07-27 MED ORDER — PANTOPRAZOLE SODIUM 40 MG IV SOLR
40.0000 mg | Freq: Every day | INTRAVENOUS | Status: DC
  Administered 2021-07-27: 40 mg via INTRAVENOUS
  Filled 2021-07-27: qty 10

## 2021-07-27 MED ORDER — SUGAMMADEX SODIUM 200 MG/2ML IV SOLN
INTRAVENOUS | Status: DC | PRN
  Administered 2021-07-27: 300 mg via INTRAVENOUS

## 2021-07-27 MED ORDER — ACETAMINOPHEN 10 MG/ML IV SOLN
INTRAVENOUS | Status: AC
  Filled 2021-07-27: qty 100

## 2021-07-27 MED ORDER — MIDAZOLAM HCL 2 MG/2ML IJ SOLN
INTRAMUSCULAR | Status: DC | PRN
  Administered 2021-07-27: 2 mg via INTRAVENOUS

## 2021-07-27 MED ORDER — NITROGLYCERIN 0.4 % RE OINT: 1.0000 "application " | TOPICAL_OINTMENT | Freq: Three times a day (TID) | RECTAL | Status: DC

## 2021-07-27 MED ORDER — 0.9 % SODIUM CHLORIDE (POUR BTL) OPTIME
TOPICAL | Status: DC | PRN
  Administered 2021-07-27: 5 mL

## 2021-07-27 MED ORDER — OXYCODONE HCL 5 MG PO TABS
5.0000 mg | ORAL_TABLET | Freq: Once | ORAL | Status: AC | PRN
  Administered 2021-07-27: 5 mg via ORAL

## 2021-07-27 MED ORDER — HYDROCODONE-ACETAMINOPHEN 5-325 MG PO TABS
1.0000 | ORAL_TABLET | ORAL | Status: DC | PRN
  Administered 2021-07-27 (×2): 1 via ORAL
  Administered 2021-07-28 (×2): 2 via ORAL
  Filled 2021-07-27: qty 1
  Filled 2021-07-27: qty 2
  Filled 2021-07-27 (×2): qty 1
  Filled 2021-07-27: qty 2

## 2021-07-27 MED ORDER — ONDANSETRON HCL 4 MG/2ML IJ SOLN
INTRAMUSCULAR | Status: AC
  Filled 2021-07-27: qty 2

## 2021-07-27 MED ORDER — SERTRALINE HCL 50 MG PO TABS
200.0000 mg | ORAL_TABLET | Freq: Every day | ORAL | Status: DC
Start: 2021-07-27 — End: 2021-07-27

## 2021-07-27 MED ORDER — DEXAMETHASONE SODIUM PHOSPHATE 10 MG/ML IJ SOLN
INTRAMUSCULAR | Status: AC
  Filled 2021-07-27: qty 1

## 2021-07-27 MED ORDER — LACTATED RINGERS IV SOLN: INTRAVENOUS | Status: DC

## 2021-07-27 MED ORDER — CEFAZOLIN SODIUM-DEXTROSE 2-3 GM-%(50ML) IV SOLR
INTRAVENOUS | Status: DC | PRN
  Administered 2021-07-27: 2 g via INTRAVENOUS

## 2021-07-27 MED ORDER — TRAZODONE HCL 50 MG PO TABS
50.0000 mg | ORAL_TABLET | Freq: Every day | ORAL | Status: DC
  Administered 2021-07-27: 50 mg via ORAL
  Filled 2021-07-27: qty 1

## 2021-07-27 MED ORDER — FENTANYL CITRATE (PF) 100 MCG/2ML IJ SOLN
INTRAMUSCULAR | Status: DC | PRN
  Administered 2021-07-27: 25 ug via INTRAVENOUS
  Administered 2021-07-27: 100 ug via INTRAVENOUS
  Administered 2021-07-27: 50 ug via INTRAVENOUS
  Administered 2021-07-27: 25 ug via INTRAVENOUS

## 2021-07-27 MED ORDER — BUPROPION HCL ER (XL) 150 MG PO TB24
150.0000 mg | ORAL_TABLET | Freq: Every day | ORAL | Status: DC
  Administered 2021-07-27 – 2021-07-28 (×2): 150 mg via ORAL
  Filled 2021-07-27 (×2): qty 1

## 2021-07-27 MED ORDER — SUCCINYLCHOLINE CHLORIDE 200 MG/10ML IV SOSY
PREFILLED_SYRINGE | INTRAVENOUS | Status: DC | PRN
  Administered 2021-07-27: 140 mg via INTRAVENOUS

## 2021-07-27 MED ORDER — BUPIVACAINE LIPOSOME 1.3 % IJ SUSP
INTRAMUSCULAR | Status: AC
Start: 2021-07-27 — End: ?
  Filled 2021-07-27: qty 20

## 2021-07-27 MED ORDER — ONDANSETRON HCL 4 MG/2ML IJ SOLN: 4.0000 mg | Freq: Four times a day (QID) | INTRAMUSCULAR | Status: DC | PRN

## 2021-07-27 MED ORDER — ROCURONIUM BROMIDE 100 MG/10ML IV SOLN
INTRAVENOUS | Status: DC | PRN
  Administered 2021-07-27: 40 mg via INTRAVENOUS
  Administered 2021-07-27: 10 mg via INTRAVENOUS

## 2021-07-27 MED ORDER — BUPIVACAINE-EPINEPHRINE 0.25% -1:200000 IJ SOLN
INTRAMUSCULAR | Status: DC | PRN
  Administered 2021-07-27: 30 mL

## 2021-07-27 MED ORDER — OXYCODONE HCL 5 MG/5ML PO SOLN: 5.0000 mg | Freq: Once | ORAL | Status: AC | PRN

## 2021-07-27 MED ORDER — SODIUM CHLORIDE 0.9 % IV SOLN: INTRAVENOUS | Status: DC

## 2021-07-27 MED ORDER — BUPIVACAINE-EPINEPHRINE (PF) 0.25% -1:200000 IJ SOLN
INTRAMUSCULAR | Status: AC
  Filled 2021-07-27: qty 30

## 2021-07-27 MED ORDER — FENTANYL CITRATE (PF) 100 MCG/2ML IJ SOLN
25.0000 ug | INTRAMUSCULAR | Status: DC | PRN
  Administered 2021-07-27 (×2): 25 ug via INTRAVENOUS

## 2021-07-27 MED ORDER — PHENYLEPHRINE 40 MCG/ML (10ML) SYRINGE FOR IV PUSH (FOR BLOOD PRESSURE SUPPORT)
PREFILLED_SYRINGE | INTRAVENOUS | Status: DC | PRN
  Administered 2021-07-27: 40 ug via INTRAVENOUS

## 2021-07-27 MED ORDER — ACETAMINOPHEN 10 MG/ML IV SOLN
INTRAVENOUS | Status: DC | PRN
Start: 2021-07-27 — End: 2021-07-27
  Administered 2021-07-27: 1000 mg via INTRAVENOUS

## 2021-07-27 MED ORDER — EPHEDRINE SULFATE (PRESSORS) 50 MG/ML IJ SOLN
INTRAMUSCULAR | Status: DC | PRN
  Administered 2021-07-27: 5 mg via INTRAVENOUS

## 2021-07-27 MED ORDER — DEXAMETHASONE SODIUM PHOSPHATE 10 MG/ML IJ SOLN
INTRAMUSCULAR | Status: DC | PRN
  Administered 2021-07-27: 8 mg via INTRAVENOUS

## 2021-07-27 MED ORDER — ONDANSETRON 4 MG PO TBDP
4.0000 mg | ORAL_TABLET | Freq: Four times a day (QID) | ORAL | Status: DC | PRN
Start: 2021-07-27 — End: 2021-07-28
  Filled 2021-07-27: qty 1

## 2021-07-27 MED ORDER — ONDANSETRON HCL 4 MG/2ML IJ SOLN
INTRAMUSCULAR | Status: DC | PRN
  Administered 2021-07-27: 4 mg via INTRAVENOUS

## 2021-07-27 MED ORDER — FENTANYL CITRATE (PF) 100 MCG/2ML IJ SOLN
INTRAMUSCULAR | Status: AC
Start: 2021-07-27 — End: ?
  Filled 2021-07-27: qty 2

## 2021-07-27 SURGICAL SUPPLY — 46 items
CANNULA CAP OBTURATR AIRSEAL 8 (CAP) ×2 IMPLANT
CHLORAPREP W/TINT 26 (MISCELLANEOUS) ×1 IMPLANT
COVER TIP SHEARS 8 DVNC (MISCELLANEOUS) ×1 IMPLANT
COVER TIP SHEARS 8MM DA VINCI (MISCELLANEOUS) ×1
COVER WAND RF STERILE (DRAPES) ×1 IMPLANT
DERMABOND ADVANCED (GAUZE/BANDAGES/DRESSINGS) ×1
DERMABOND ADVANCED .7 DNX12 (GAUZE/BANDAGES/DRESSINGS) ×1 IMPLANT
DRAPE ARM DVNC X/XI (DISPOSABLE) ×3 IMPLANT
DRAPE COLUMN DVNC XI (DISPOSABLE) ×1 IMPLANT
DRAPE DA VINCI XI ARM (DISPOSABLE) ×4
DRAPE DA VINCI XI COLUMN (DISPOSABLE) ×1
ELECT REM PT RETURN 9FT ADLT (ELECTROSURGICAL) ×2
ELECTRODE REM PT RTRN 9FT ADLT (ELECTROSURGICAL) ×1 IMPLANT
GLOVE SURG ORTHO LTX SZ7.5 (GLOVE) ×4 IMPLANT
GOWN STRL REUS W/ TWL LRG LVL3 (GOWN DISPOSABLE) ×1 IMPLANT
GOWN STRL REUS W/ TWL XL LVL3 (GOWN DISPOSABLE) ×2 IMPLANT
GOWN STRL REUS W/TWL LRG LVL3 (GOWN DISPOSABLE)
GOWN STRL REUS W/TWL XL LVL3 (GOWN DISPOSABLE) ×3
GRASPER SUT TROCAR 14GX15 (MISCELLANEOUS) ×1 IMPLANT
IRRIGATION STRYKERFLOW (MISCELLANEOUS) IMPLANT
IRRIGATOR STRYKERFLOW (MISCELLANEOUS)
IV NS IRRIG 3000ML ARTHROMATIC (IV SOLUTION) IMPLANT
KIT PINK PAD W/HEAD ARE REST (MISCELLANEOUS) ×2 IMPLANT
KIT PINK PAD W/HEAD ARM REST (MISCELLANEOUS) ×1 IMPLANT
KIT TURNOVER KIT A (KITS) ×2 IMPLANT
MANIFOLD NEPTUNE II (INSTRUMENTS) ×1 IMPLANT
NDL INSUFFLATION 14GA 120MM (NEEDLE) ×1 IMPLANT
NEEDLE HYPO 22GX1.5 SAFETY (NEEDLE) ×2 IMPLANT
NEEDLE INSUFFLATION 14GA 120MM (NEEDLE) ×2 IMPLANT
NS IRRIG 500ML POUR BTL (IV SOLUTION) ×2 IMPLANT
PACK LAP CHOLECYSTECTOMY (MISCELLANEOUS) ×2 IMPLANT
SCISSORS METZENBAUM CVD 33 (INSTRUMENTS) IMPLANT
SEAL CANN UNIV 5-8 DVNC XI (MISCELLANEOUS) ×2 IMPLANT
SEAL XI 5MM-8MM UNIVERSAL (MISCELLANEOUS) ×1
SET TUBE FILTERED XL AIRSEAL (SET/KITS/TRAYS/PACK) ×2 IMPLANT
SOLUTION ELECTROLUBE (MISCELLANEOUS) ×2 IMPLANT
SPIKE FLUID TRANSFER (MISCELLANEOUS) ×2 IMPLANT
SUT MNCRL 4-0 (SUTURE) ×1
SUT MNCRL 4-0 27XMFL (SUTURE) ×1
SUT STRATAFIX 0 PDS+ CT-2 23 (SUTURE) ×2
SUT VICRYL 0 AB UR-6 (SUTURE) ×2 IMPLANT
SUT VLOC 90 S/L VL9 GS22 (SUTURE) ×2 IMPLANT
SUTURE MNCRL 4-0 27XMF (SUTURE) ×1 IMPLANT
SUTURE STRATFX 0 PDS+ CT-2 23 (SUTURE) ×1 IMPLANT
TROCAR Z-THREAD FIOS 11X100 BL (TROCAR) ×2 IMPLANT
WATER STERILE IRR 500ML POUR (IV SOLUTION) ×2 IMPLANT

## 2021-07-27 NOTE — H&P (Signed)
Patient ID: Maria Fuller, female   DOB: 1985/08/04, 36 y.o.   MRN: [id number] ? ?Chief Complaint: Postoperative right lower quadrant abdominal pain ? ?History of Present Illness ?Maria Fuller is a 36 y.o. female who is status post laparoscopic right salpingo-oophorectomy on March 23.  She reports awaking March 24 with pain in right lower quadrant, postoperative pain under resolving.  Progressive during the day.  She denies vomiting, crampy pain, constipation or constipation.  She denies fevers and chills.  She denies pain that she cannot resolve with pain medications.  Pain is nonradiating.  She reports that she had a laparoscopic procedure at [facility] about 2 weeks ago for an internal hernia.  In 2 years prior she had had something similar.  History of gastric bypass. ? ?Past Medical History ?Past Medical History:  ?Diagnosis Date  ? Anemia   ? slight  ? Bradycardia   ? Depression   ? Diabetes mellitus without complication (HCC)   ? resolved with weight loss from gastric bypass  ? Headache   ? migraines. resolved since gastric bypass  ? High cholesterol   ? History of kidney stones   ? last week  ? Hypertension   ? resolved since gastric bypass, severe pre-eclampsia with G1  ? Low BP   ? Meningitis 2015  ? PCOS (polycystic ovarian syndrome)   ? Preterm labor   ? Sleep apnea   ? had gastric bypass surgery and now resolved  ? Syncope   ? Vertigo couple years ago  ?  ? ? ?Past Surgical History:  ?Procedure Laterality Date  ? CESAREAN SECTION    ? CESAREAN SECTION N/A 05/07/2016  ? Procedure: CESAREAN SECTION;  Surgeon: Nadara Mustard, MD;  Location: [facility] ORS;  Service: Obstetrics;  Laterality: N/A;  Time of birth: [birth year] ?Sex: Female ?Weight: 3940 kg, 8 lb 11 oz  ? CHOLECYSTECTOMY    ? CYSTOSCOPY WITH STENT PLACEMENT Right 09/19/2016  ? Procedure: CYSTOSCOPY WITH STENT PLACEMENT;  Surgeon: Hildred Laser, MD;  Location: [facility] OR[facility];  Service: Urology;  Laterality: Right;  ? FLEXIBLE SIGMOIDOSCOPY N/A 07/28/2019  ?  Procedure: FLEXIBLE SIGMOIDOSCOPY;  Surgeon: Toney Reil, MD;  Location: [facility];  Service: Endoscopy;  Laterality: N/A;  ? GASTRIC BYPASS  04/2015  ? Duodenal switch  ? LAPAROSCOPIC OVARIAN CYSTECTOMY Right 09/28/2018  ? Procedure: LAPAROSCOPIC RIGHT OVARIAN CYSTECTOMY;  Surgeon: Nadara Mustard, MD;  Location: [facility] OR[facility];  Service: Gynecology;  Laterality: Right;  ? LAPAROSCOPIC UNILATERAL SALPINGO OOPHERECTOMY Right 07/25/2021  ? Procedure: LAPAROSCOPIC UNILATERAL SALPINGO OOPHORECTOMY;  Surgeon: Nadara Mustard, MD;  Location: [facility];  Service: Gynecology;  Laterality: Right;  ? LEFT HEART CATH AND CORONARY ANGIOGRAPHY Left 04/07/2018  ? Procedure: LEFT HEART CATH AND CORONARY ANGIOGRAPHY;  Surgeon: Alwyn Pea, MD;  Location: [facility];  Service: Cardiovascular;  Laterality: Left;  ? small bowel obstruction  07/2019  ? TONSILLECTOMY    ? URETEROSCOPY WITH HOLMIUM LASER LITHOTRIPSY Right 09/19/2016  ? Procedure: URETEROSCOPY WITH HOLMIUM LASER LITHOTRIPSY;  Surgeon: Hildred Laser, MD;  Location: [facility];  Service: Urology;  Laterality: Right;  ? ? ?Allergies  ?Allergen Reactions  ? Ceftin [Cefuroxime Axetil] Rash  ? Penicillins Rash  ?  Has patient had a PCN reaction causing immediate rash, facial/tongue/throat swelling, SOB or lightheadedness with hypotension: Yes ?Has patient had a PCN reaction causing severe rash involving mucus membranes or skin necrosis: No ?Has patient had a PCN reaction that required hospitalization  No ?Has patient had a PCN reaction occurring within the last 10 years: No ?If all of the above answers are "NO", then may proceed with Cephalosporin use.  ? ? ?Current Facility-Administered Medications  ?Medication Dose Route Frequency Provider Last Rate Last Admin  ? lactated ringers infusion   Intravenous Continuous Nita Sickle, MD 125 mL/hr at 07/27/21 0259 New Bag at 07/27/21 0259  ? ?Current Outpatient Medications  ?Medication Sig Dispense  Refill  ? buPROPion (WELLBUTRIN XL) 150 MG 24 hr tablet Take 150 mg by mouth daily.    ? levonorgestrel (MIRENA) 20 MCG/24HR IUD 1 each by Intrauterine route once.    ? ondansetron (ZOFRAN-ODT) 4 MG disintegrating tablet Take 1 tablet (4 mg total) by mouth every 8 (eight) hours as needed for up to 5 days. 15 tablet 0  ? oxyCODONE-acetaminophen (PERCOCET/ROXICET) 5-325 MG tablet Take 1 tablet by mouth every 4 (four) hours as needed for moderate pain. 30 tablet 0  ? sertraline (ZOLOFT) 100 MG tablet Take 200 mg by mouth at bedtime.     ? colestipol (COLESTID) 1 g tablet Take 4 tablets (4 g total) by mouth 2 (two) times daily. 240 tablet 1  ? Nitroglycerin 0.4 % OINT Place 1 application rectally 3 (three) times daily. (Patient not taking: Reported on 07/23/2021) 30 g 0  ? oxyCODONE (OXY IR/ROXICODONE) 5 MG immediate release tablet Take by mouth. (Patient not taking: Reported on 07/25/2021)    ? traZODone (DESYREL) 50 MG tablet Take 50 mg by mouth at bedtime.     ? ? ?Family History ?Family History  ?Problem Relation Age of Onset  ? Polycystic ovary syndrome Mother   ? Diabetes Father   ? Bladder Cancer Father   ? Depression Father   ? Endometriosis Sister   ? Polycystic ovary syndrome Sister   ? Breast cancer Paternal Grandmother   ? Diabetes Paternal Grandmother   ? Diabetes Paternal Grandfather   ? Kidney cancer Neg Hx   ? Prostate cancer Neg Hx   ?  ? ? ?Social History ?Social History  ? ?Tobacco Use  ? Smoking status: Never  ? Smokeless tobacco: Never  ?Vaping Use  ? Vaping Use: Never used  ?Substance Use Topics  ? Alcohol use: No  ? Drug use: No  ?  ?  ? ? ?Review of Systems  ?All other systems reviewed and are negative. ?  ? ?Physical Exam ?Blood pressure 112/66, pulse 61, temperature 98.3 ?F (36.8 ?C), temperature source Oral, resp. rate 18, height 6\' 1"  (1.854 m), weight 110.2 kg, SpO2 96 %. ?Last Weight  Most recent update: 07/26/2021 10:38 PM  ? ? Weight  ?110.2 kg (243 lb)  ?      ? ?  ? ? ?CONSTITUTIONAL: Well  developed, and nourished, appropriately responsive and aware without distress.   ?EYES: Sclera non-icteric.   ?EARS, NOSE, MOUTH AND THROAT: Mask worn.    Hearing is intact to voice.  ?NECK: Trachea is midline, and there is no jugular venous distension.  ?LYMPH NODES:  Lymph nodes in the neck are not enlarged. ?RESPIRATORY:  Lungs are clear, and breath sounds are equal bilaterally. Normal respiratory effort without pathologic use of accessory muscles. ?CARDIOVASCULAR: Heart is regular in rate and rhythm. ?GI: The abdomen is tender in the right lower quadrant under a dressing present there.  There is another dressing closer to the left of periumbilical the midline.  Other healing laparoscopic incision scars noted.  Otherwise soft, nontender, and nondistended. There were  no palpable masses. I did not appreciate hepatosplenomegaly. There were normal bowel sounds.  She had increased pain with laying her flat, I attempted mild pressure in the area at this and despite having morphine recently she was in way too much pain to attempt further reduction. ?MUSCULOSKELETAL:  Symmetrical muscle tone appreciated in all four extremities.    ?SKIN: Skin turgor is normal. No pathologic skin lesions appreciated.  ?NEUROLOGIC:  Motor and sensation appear grossly normal.  Cranial nerves are grossly without defect. ?PSYCH:  Alert and oriented to person, place and time. Affect is appropriate for situation. ? ?Data Reviewed ?I have personally reviewed what is currently available of the patient's imaging, recent labs and medical records.   ?Labs:  ? ?  Latest Ref Rng & Units 07/26/2021  ? 11:00 PM 07/25/2021  ? 11:32 AM 07/22/2021  ?  4:38 PM  ?CBC  ?WBC 4.0 - 10.5 K/uL 13.2   6.8   6.8    ?Hemoglobin 12.0 - 15.0 g/dL 16.111.5   09.611.4   04.512.0    ?Hematocrit 36.0 - 46.0 % 35.3   34.4   36.7    ?Platelets 150 - 400 K/uL 371   333   340    ? ? ?  Latest Ref Rng & Units 07/26/2021  ? 11:00 PM 07/22/2021  ?  4:38 PM 07/09/2021  ?  7:44 PM  ?CMP  ?Glucose 70  - 99 mg/dL 409105   91   94    ?BUN 6 - 20 mg/dL 13   12   10     ?Creatinine 0.44 - 1.00 mg/dL 8.110.57   9.140.50   7.820.49    ?Sodium 135 - 145 mmol/L 139   135   138    ?Potassium 3.5 - 5.1 mmol/L 3.4   4.0   4.2

## 2021-07-27 NOTE — ED Notes (Signed)
Pt back from CT

## 2021-07-27 NOTE — Progress Notes (Signed)
[facility] ?SURGICAL PROGRESS NOTE ? ?Hospital Day(s): 0.  ? ?Post op day(s): Day of Surgery.  ? ?Interval History: Patient seen and examined, no acute events or new complaints overnight. Patient reports pain.  She denies nausea or vomiting, is gradually advance her diet to full liquids during the day.  She reports resumption of bowel activity.  She feels uncomfortable returning home at this point. ? ?Review of Systems:  ?Constitutional: denies fever, chills  ?Respiratory: denies any shortness of breath  ?Cardiovascular: denies chest pain or palpitations  ?Musculoskeletal: denies pain, decreased motor or sensation ?Integumentary: denies any other rashes or skin discolorations ? ?Vital signs in last 24 hours: [min-max] current  ?Temp:  [98.3 ?F (36.8 ?C)-99 ?F (37.2 ?C)] 98.3 ?F (36.8 ?C) (03/25 2117) ?Pulse Rate:  [54-71] 54 (03/25 2117) ?Resp:  [0-23] 17 (03/25 2117) ?BP: (98-118)/(53-72) 98/58 (03/25 2117) ?SpO2:  [94 %-100 %] 99 % (03/25 2117) ?Weight:  [110.2 kg] 110.2 kg (03/24 2237)     Height: 6\' 1"  (185.4 cm) Weight: 110.2 kg BMI (Calculated): 32.07  ? ?Intake/Output last 2 shifts:  ?03/24 0701 - 03/25 0700 ?In: 100 [IV Piggyback:100] ?Out: 5 [Blood:5]  ? ?Physical Exam:  ?Constitutional: alert, cooperative and no distress  ?Respiratory: breathing non-labored at rest  ?Cardiovascular: regular rate and sinus rhythm  ?Gastrointestinal: soft, non-tender, and non-distended.  Some right lower quadrant tenderness noted.  As expected from recent hernia repair. ?Integumentary: Incisions are clean, dry and intact. ? ?Labs:  ? ?  Latest Ref Rng & Units 07/26/2021  ? 11:00 PM 07/25/2021  ? 11:32 AM 07/22/2021  ?  4:38 PM  ?CBC  ?WBC 4.0 - 10.5 K/uL 13.2   6.8   6.8    ?Hemoglobin 12.0 - 15.0 g/dL 07/24/2021   27.5   17.0    ?Hematocrit 36.0 - 46.0 % 35.3   34.4   36.7    ?Platelets 150 - 400 K/uL 371   333   340    ? ? ?  Latest Ref Rng & Units 07/26/2021  ? 11:00 PM 07/22/2021  ?  4:38 PM 07/09/2021  ?  7:44 PM   ?CMP  ?Glucose 70 - 99 mg/dL 09/08/2021   91   94    ?BUN 6 - 20 mg/dL 13   12   10     ?Creatinine 0.44 - 1.00 mg/dL 494     4.96    ?Sodium 135 - 145 mmol/L 139   135   138    ?Potassium 3.5 - 5.1 mmol/L 3.4   4.0   4.2    ?Chloride 98 - 111 mmol/L 108   107   108    ?CO2 22 - 32 mmol/L 25   24   23     ?Calcium 8.9 - 10.3 mg/dL 8.3   8.5   8.4    ?Total Protein 6.5 - 8.1 g/dL 7.3   7.5   7.0    ?Total Bilirubin 0.3 - 1.2 mg/dL 0.8   0.8   0.5    ?Alkaline Phos 38 - 126 U/L 90   92   98    ?AST 15 - 41 U/L 16   20   18     ?ALT 0 - 44 U/L 24   24   23     ? ? ? ?Imaging studies: No new pertinent imaging studies ? ? ?Assessment/Plan:  ?36 y.o. female with  Day of Surgery s/p robotic right lower quadrant incisional hernia  repair for incarceration with small bowel obstruction, complicated by pertinent comorbidities including. ? ?Patient Active Problem List  ? Diagnosis Date Noted  ? Incarcerated incisional hernia 07/27/2021  ? Right ovarian cyst 09/09/2018  ? Secondary oligomenorrhea 05/25/2018  ? Rectal pain 05/25/2018  ? PCOS (polycystic ovarian syndrome) 05/25/2018  ? Menometrorrhagia 05/25/2018  ? Hydronephrosis 09/22/2016  ? Nephrolithiasis 09/22/2016  ? Left flank pain 12/11/2015  ? Right flank pain 10/06/2015  ? History of hypertension 08/30/2015  ? History of PCOS 08/30/2015  ? Vitamin D deficiency 04/20/2015  ? Diabetes mellitus (HCC) 03/27/2015  ? Dysmenorrhea 05/25/2014  ? Rectal bleeding 05/25/2014  ? Contraception 04/07/2014  ? Hypothyroidism 07/22/2013  ? Major depressive disorder, single episode 07/22/2013  ? HTN (hypertension) 07/15/2013  ? Headache 07/10/2013  ? Anxiety state 03/05/2013  ? Morbid obesity with BMI of 50.0-59.9, adult (HCC) 03/05/2013  ? Preventative health care 03/05/2013  ? History of diabetes mellitus, type II 03/04/2013  ? Obstructive sleep apnea 09/14/2012  ? Hyperlipidemia 07/28/2012  ? ? -Advance diet as tolerated. ? -Convert to p.o. pain medications. ? -Ambulate. ? -Anticipate  likely discharge home tomorrow. ? ?All of the above findings and recommendations were discussed with the patient, and all of patient's questions were answered to their expressed satisfaction. ? ?-- ?Campbell Lerner, M.D., FACS ?07/27/2021  ?

## 2021-07-27 NOTE — Anesthesia Preprocedure Evaluation (Signed)
Anesthesia Evaluation  ?Patient identified by MRN, date of birth, ID band ?Patient awake ? ?General Assessment Comment: ? ?Incarcerated ventral hernia. Patient has been nauseated since yesterday morning, but was able to tolerate a normal breakfast. She has not vomited at all (she does confirm she is actually able to vomit, which can be an issue after gastric bypass). She is passing gas and had two bowel movements while hear. CT scan says partial bowel obstruction but clinically no strong evidence; can hold off on pre-op NGT ? ?Reviewed: ?Allergy & Precautions, NPO status , Patient's Chart, lab work & pertinent test results ? ?History of Anesthesia Complications ?Negative for: history of anesthetic complications ? ?Airway ?Mallampati: II ? ? ?Neck ROM: Full ? ? ? Dental ? ?(+) Teeth Intact,  ?  ?Pulmonary ?neg pulmonary ROS, neg sleep apnea, neg COPD, Patient abstained from smoking.Not current smoker,  ?OSA resolved after gastric bypass ?  ?Pulmonary exam normal ?breath sounds clear to auscultation ? ? ? ? ? ? Cardiovascular ?Exercise Tolerance: Good ?METShypertension, (-) CAD and (-) Past MI Normal cardiovascular exam(-) dysrhythmias  ?Rhythm:Regular Rate:Normal ? ?Patient with abnormal cath (below) in 2019, last cardiology note at that time said to follow up in a month, patient never did. Patient claims she was told all the tests were normal. She denies any cardiac symptoms in the intervening years. I encouraged patient to follow up with her PCP about these cath results and whether cardiology follow up would be warranted ? ?ECG 07/10/21: normal ? ?Myocardial perfusion 03/24/18:  ?Moderately abnormal myocardial perfusion scan evidence of inferior anterior apical defect with border zone evidence of redistribution and ischemia overall ejection fraction of 58%. ?Consider invasive strategy possibly cardiac cath if clinically relevant. ? ?Echo 03/23/18:  ?NORMAL LEFT VENTRICULAR SYSTOLIC  FUNCTION ? WITH MILD LVH  ?NORMAL RIGHT VENTRICULAR SYSTOLIC FUNCTION  ?MILD VALVULAR REGURGITATION ?NO VALVULAR STENOSIS  ?MILD MR, TR  ?EF 55% ?  ?Neuro/Psych ? Headaches, PSYCHIATRIC DISORDERS Anxiety Depression Vertigo  ?  ? GI/Hepatic ?neg GERD  ,(+)  ?  ? (-) substance abuse ? , S/p gastric bypass ?  ?Endo/Other  ?diabetesHypothyroidism PCOS, obesity ? Renal/GU ?Renal disease (nephrolithiasis)  ? ?  ?Musculoskeletal ? ? Abdominal ?  ?Peds ? Hematology ?negative hematology ROS ?(+)   ?Anesthesia Other Findings ?Past Medical History: ?No date: Anemia ?    Comment:  slight ?No date: Bradycardia ?No date: Depression ?No date: Diabetes mellitus without complication (HCC) ?    Comment:  resolved with weight loss from gastric bypass ?No date: Headache ?    Comment:  migraines. resolved since gastric bypass ?No date: High cholesterol ?No date: History of kidney stones ?    Comment:  last week ?No date: Hypertension ?    Comment:  resolved since gastric bypass, severe pre-eclampsia with ?             G1 ?No date: Low BP ?2015: Meningitis ?No date: PCOS (polycystic ovarian syndrome) ?No date: Preterm labor ?No date: Sleep apnea ?    Comment:  had gastric bypass surgery and now resolved ?No date: Syncope ?couple years ago: Vertigo ? Reproductive/Obstetrics ? ?  ? ? ? ? ? ? ? ? ? ? ? ? ? ?  ?  ? ? ? ? ? ? ? ? ?Anesthesia Physical ? ?Anesthesia Plan ? ?ASA: 2 and emergent ? ?Anesthesia Plan: General  ? ?Post-op Pain Management: Ofirmev IV (intra-op)* and Dilaudid IV  ? ?Induction: Intravenous and Rapid sequence ? ?PONV  Risk Score and Plan: 4 or greater and Ondansetron, Dexamethasone, Treatment may vary due to age or medical condition and Midazolam ? ?Airway Management Planned: Oral ETT ? ?Additional Equipment: None ? ?Intra-op Plan:  ? ?Post-operative Plan: Extubation in OR ? ?Informed Consent: I have reviewed the patients History and Physical, chart, labs and discussed the procedure including the risks, benefits and  alternatives for the proposed anesthesia with the patient or authorized representative who has indicated his/her understanding and acceptance.  ? ? ? ?Dental advisory given ? ?Plan Discussed with: CRNA ? ?Anesthesia Plan Comments: (Discussed risks of anesthesia with patient, including PONV, sore throat, lip/dental/eye damage. Rare risks discussed as well, such as cardiorespiratory and neurological sequelae, and allergic reactions. Discussed the role of CRNA in patient's perioperative care. Patient understands.)  ? ? ? ? ? ? ?Anesthesia Quick Evaluation ? ?

## 2021-07-27 NOTE — ED Notes (Signed)
Pt assisted to the restroom to void and to change into a hospital gown.  ?

## 2021-07-27 NOTE — Op Note (Signed)
Robotic assisted laparoscopic incisional hernia Repair. ?  ?Pre-operative Diagnosis: Incarcerated incisional hernia with small bowel obstruction ?  ?Post-operative Diagnosis: same. ?  ?Surgeon:  [name], M.D., FACS ?  ?Anesthesia: Gen. with endotracheal tube ?  ?Findings:  See Media for photos, labeled Today at 05:47 Operative procedure. Attached to 07/25/2021 Orders Only.  ? ?Estimated Blood Loss: 5cc ?  ?Complications: none      ?     ?Procedure Details  ?The benefits, complications, treatment options, and expected outcomes were discussed with the patient. The risks of bleeding, infection, recurrence of symptoms, failure to resolve symptoms, bowel injury, mesh placement, mesh infection, any of which could require further surgery were reviewed with the patient. The likelihood of improving the patient's symptoms with return to their baseline status is good.  The patient and/or family concurred with the proposed plan, giving informed consent.  The patient was taken to Operating Room, identified and the procedure verified.  A Time Out was held and the above information confirmed. ?  ?Prior to the induction of general anesthesia, antibiotic prophylaxis was administered. VTE prophylaxis was in place. General endotracheal anesthesia was then administered and tolerated well. After the induction, the abdomen was prepped with Chloraprep and draped in the sterile fashion. The patient was positioned in the supine position. ?After local infiltration of quarter percent Marcaine with epinephrine, stab incision was made left upper quadrant.  Just below the costal margin approximately midclavicular line the Veress needle is passed with sensation of the layers to penetrate the abdominal wall and into the peritoneum.  Saline drop test is confirmed peritoneal placement.  Insufflation is initiated with carbon dioxide to pressures of 15 mmHg. ?Marcaine quarter percent was used to inject all the incision sites. ?We used a left  upper quadrant subcostal incision and using an optical FIOS 11 mm trocar, it was inserted with direct visualization and pneumoperitoneum obtained.  No hemodynamic compromise, no evidence of intraperitoneal injury. Two additional 8 mm ports were placed under direct visualization on the left lateral abdomen.  I visualized the hernia and there was an eye-shaped right lower quadrant incisional hernia measuring approximately 4 cm, in widest dimension.   ?The robot was brought to the surgical field and docked in the standard fashion.  We made sure that all instrumentation was kept under direct vision at all times and there was no collision between the arms.  I scrubbed out and went to the console. ?Confirm and measured that the defect was 4 cm.  Using a 0 nonabsorbable strata fix suture we closed the defect primarily.  Due to the acute nature of the defect, I did not feel the addition of mesh necessary considering the patient's overall abdominal wall laxity.   ? ?A second look laparoscopy revealed no evidence of intra-abdominal injury.  ?  The instruments were removed and the robot was undocked.  The laparoscopic ports were removed under direct visualization and the pneumoperitoneum was deflated. ?The left subcostal incision will clearly be protected by the costal margin, requiring no fascial closure due to the staggered layer penetration.  Incisions were closed with  4-0 Monocryl   ?Dermabond was used to coat the skin.  Patient tolerated procedure well and there were no immediate complications. Needle and laparotomy counts were correct ?  ? ?[name] M.D., FACS ?07/27/2021 6:12 AM ?   ?

## 2021-07-27 NOTE — ED Notes (Signed)
Surgeon @ the bedside. 

## 2021-07-27 NOTE — ED Provider Notes (Addendum)
? ?[facility] ?Provider Note ? ? ? Event Date/Time  ? First MD Initiated Contact with Patient 07/27/21 0015   ?  (approximate) ? ? ?History  ? ?Post-op Problem ? ? ?HPI ? ?Maria Fuller is a 36 y.o. female with a history of gastric bypass surgery and postop day 1 from a laparoscopic right salpingo-oophorectomy who presents for evaluation of abdominal pain.  Patient reports that she was doing well on the day after the surgery with some minimal pain from the surgery.  She woke up this morning and the pain was more severe and constant throughout the day.  She has had nausea associated with it but no vomiting, she is passing flatus, no fever or chills.  She reports that the pain is localized to the right lower quadrant, sharp, constant, 10 out of 10.  She called her surgeon recommended she come to the ED for evaluation. ?  ? ? ?Past Medical History:  ?Diagnosis Date  ? Anemia   ? slight  ? Bradycardia   ? Depression   ? Diabetes mellitus without complication (HCC)   ? resolved with weight loss from gastric bypass  ? Headache   ? migraines. resolved since gastric bypass  ? High cholesterol   ? History of kidney stones   ? last week  ? Hypertension   ? resolved since gastric bypass, severe pre-eclampsia with G1  ? Low BP   ? Meningitis 2015  ? PCOS (polycystic ovarian syndrome)   ? Preterm labor   ? Sleep apnea   ? had gastric bypass surgery and now resolved  ? Syncope   ? Vertigo couple years ago  ? ? ?Past Surgical History:  ?Procedure Laterality Date  ? CESAREAN SECTION    ? CESAREAN SECTION N/A 05/07/2016  ? Procedure: CESAREAN SECTION;  Surgeon: Nadara Mustardobert P Harris, MD;  Location: [facility] ORS;  Service: Obstetrics;  Laterality: N/A;  Time of birth: [birth year] ?Sex: Female ?Weight: 3940 kg, 8 lb 11 oz  ? CHOLECYSTECTOMY    ? CYSTOSCOPY WITH STENT PLACEMENT Right 09/19/2016  ? Procedure: CYSTOSCOPY WITH STENT PLACEMENT;  Surgeon: Hildred LaserBudzyn, Brian James, MD;  Location: [facility];  Service: Urology;  Laterality:  Right;  ? FLEXIBLE SIGMOIDOSCOPY N/A 07/28/2019  ? Procedure: FLEXIBLE SIGMOIDOSCOPY;  Surgeon: Toney ReilVanga, Rohini Reddy, MD;  [facility];  Service: Endoscopy;  Laterality: N/A;  ? GASTRIC BYPASS  04/2015  ? Duodenal switch  ? LAPAROSCOPIC OVARIAN CYSTECTOMY Right 09/28/2018  ? Procedure: LAPAROSCOPIC RIGHT OVARIAN CYSTECTOMY;  Surgeon: Nadara MustardHarris, Robert P, MD;  Location: [facility] OR[facility];  Service: Gynecology;  Laterality: Right;  ? LAPAROSCOPIC UNILATERAL SALPINGO OOPHERECTOMY Right 07/25/2021  ? Procedure: LAPAROSCOPIC UNILATERAL SALPINGO OOPHORECTOMY;  Surgeon: Nadara MustardHarris, Robert P, MD;  Location: [facility];  Service: Gynecology;  Laterality: Right;  ? LEFT HEART CATH AND CORONARY ANGIOGRAPHY Left 04/07/2018  ? Procedure: LEFT HEART CATH AND CORONARY ANGIOGRAPHY;  Surgeon: Alwyn Peaallwood, Dwayne D, MD;  Location: [facility];  Service: Cardiovascular;  Laterality: Left;  ? small bowel obstruction  07/2019  ? TONSILLECTOMY    ? URETEROSCOPY WITH HOLMIUM LASER LITHOTRIPSY Right 09/19/2016  ? Procedure: URETEROSCOPY WITH HOLMIUM LASER LITHOTRIPSY;  Surgeon: Hildred LaserBudzyn, Brian James, MD;  Location: [facility];  Service: Urology;  Laterality: Right;  ? ? ? ?Physical Exam  ? ?Triage Vital Signs: ?ED Triage Vitals [07/26/21 2237]  ?Enc Vitals Group  ?   BP 118/72  ?   Pulse Rate 66  ?   Resp 19  ?  Temp 98.3 ?F (36.8 ?C)  ?   Temp Source Oral  ?   SpO2 97 %  ?   Weight 243 lb (110.2 kg)  ?   Height 6\' 1"  (1.854 m)  ?   Head Circumference   ?   Peak Flow   ?   Pain Score 9  ?   Pain Loc   ?   Pain Edu?   ?   Excl. in GC?   ? ? ?Most recent vital signs: ?Vitals:  ? 07/27/21 0100 07/27/21 0225  ?BP: 112/62 118/60  ?Pulse: (!) 57 61  ?Resp: 18 18  ?Temp:    ?SpO2: 100% 96%  ? ? ? ?Constitutional: Alert and oriented.  Patient looks very uncomfortable due to pain ?HEENT: ?     Head: Normocephalic and atraumatic.    ?     Eyes: Conjunctivae are normal. Sclera is non-icteric.  ?     Mouth/Throat: Mucous membranes are moist.  ?     Neck:  Supple with no signs of meningismus. ?Cardiovascular: Regular rate and rhythm. No murmurs, gallops, or rubs. 2+ symmetrical distal pulses are present in all extremities.  ?Respiratory: Normal respiratory effort. Lungs are clear to auscultation bilaterally.  ?Gastrointestinal: Soft and nondistended with well appearing laparoscopic sites.  Patient is extremely tender to palpation on the right lower quadrant with no rebound or guarding ?Genitourinary: No CVA tenderness. ?Musculoskeletal:  No edema, cyanosis, or erythema of extremities. ?Neurologic: Normal speech and language. Face is symmetric. Moving all extremities. No gross focal neurologic deficits are appreciated. ?Skin: Skin is warm, dry and intact. No rash noted. ?Psychiatric: Mood and affect are normal. Speech and behavior are normal. ? ?ED Results / Procedures / Treatments  ? ?Labs ?(all labs ordered are listed, but only abnormal results are displayed) ?Labs Reviewed  ?CBC - Abnormal; Notable for the following components:  ?    Result Value  ? WBC 13.2 (*)   ? RBC 3.83 (*)   ? Hemoglobin 11.5 (*)   ? HCT 35.3 (*)   ? All other components within normal limits  ?COMPREHENSIVE METABOLIC PANEL - Abnormal; Notable for the following components:  ? Potassium 3.4 (*)   ? Glucose, Bld 105 (*)   ? Calcium 8.3 (*)   ? All other components within normal limits  ?RESP PANEL BY RT-PCR (FLU A&B, COVID) [id number]  ?URINALYSIS, ROUTINE W REFLEX MICROSCOPIC  ? ? ? ?EKG ? ?none ? ? ?RADIOLOGY ?I, 07/29/21, attending MD, have personally viewed and interpreted the images obtained during this visit as below: ? ?CT shows loop of small bowel going through the laparoscopic site on the right lower quadrant causing bowel obstruction ? ? ?___________________________________________________ ?Interpretation by Radiologist:  ?CT ABDOMEN PELVIS W CONTRAST ? ?Result Date: 07/27/2021 ?CLINICAL DATA:  Acute onset right lower quadrant pain, history of recent right oophorectomy EXAM: CT  ABDOMEN AND PELVIS WITH CONTRAST TECHNIQUE: Multidetector CT imaging of the abdomen and pelvis was performed using the standard protocol following bolus administration of intravenous contrast. RADIATION DOSE REDUCTION: This exam was performed according to the departmental dose-optimization program which includes automated exposure control, adjustment of the mA and/or kV according to patient size and/or use of iterative reconstruction technique. CONTRAST:  07/29/2021 OMNIPAQUE IOHEXOL 300 MG/ML  SOLN COMPARISON:  07/22/2021 FINDINGS: Lower chest: No acute abnormality. Hepatobiliary: No focal liver abnormality is seen. Status post cholecystectomy. No biliary dilatation. Pancreas: Unremarkable. No pancreatic ductal dilatation or surrounding inflammatory changes. Spleen: Normal in size  without focal abnormality. Adrenals/Urinary Tract: Adrenal glands are within normal limits. Stable 14 mm stone is noted in the lower pole of the left kidney. No obstructive changes are seen. The bladder is decompressed. Stomach/Bowel: The appendix is within normal limits. No obstructive or inflammatory changes of the colon are seen. There is herniation of small bowel loops through a defect within the right rectus muscle likely related to the recent laparoscopy. Proximal to this herniated loop there is small bowel dilatation identified. The more distal small bowel beyond this herniation is within normal limits. The stomach shows changes of prior bariatric surgery. Vascular/Lymphatic: No significant vascular findings are present. No enlarged abdominal or pelvic lymph nodes. Reproductive: Uterus and bilateral adnexa are unremarkable. IUD is noted in place. Other: No free fluid is seen. Mild free air is noted which is related to the recent laparoscopic surgery. Musculoskeletal: No acute bony abnormality is noted. IMPRESSION: Herniation of a loop of small bowel through recent laparoscopy defect in the right rectus muscle. This is best seen on image  number 72 of series 2 with more proximal partial small-bowel obstruction. Nonobstructing left renal stone. No other focal abnormality is noted. Electronically Signed   By: Alcide Clever M.D.   On: 03/25/202

## 2021-07-27 NOTE — ED Notes (Signed)
Pt to CT

## 2021-07-27 NOTE — Anesthesia Procedure Notes (Signed)
Procedure Name: Intubation ?Date/Time: 07/27/2021 4:54 AM ?Performed by: Hedda Slade, CRNA ?Pre-anesthesia Checklist: Patient identified, Patient being monitored, Timeout performed, Emergency Drugs available and Suction available ?Patient Re-evaluated:Patient Re-evaluated prior to induction ?Oxygen Delivery Method: Circle system utilized ?Preoxygenation: Pre-oxygenation with 100% oxygen ?Induction Type: IV induction and Rapid sequence ?Laryngoscope Size: 3 and McGraph ?Grade View: Grade I ?Tube type: Oral ?Tube size: 7.0 mm ?Number of attempts: 1 ?Airway Equipment and Method: Stylet ?Placement Confirmation: ETT inserted through vocal cords under direct vision, positive ETCO2 and breath sounds checked- equal and bilateral ?Secured at: 21 cm ?Tube secured with: Tape ?Dental Injury: Teeth and Oropharynx as per pre-operative assessment  ? ? ? ? ?

## 2021-07-27 NOTE — ED Notes (Signed)
Lab called about the patients sunquest account being locked - Ok'd by lab to send a patient chart label with date, time, and badge #.  ?

## 2021-07-27 NOTE — Transfer of Care (Signed)
Immediate Anesthesia Transfer of Care Note ? ?Patient: Maria Fuller ? ?Procedure(s) Performed: XI ROBOTIC ASSISTED INCARCERATED INCISIONAL HERNIA REPAIR (Abdomen) ? ?Patient Location: PACU ? ?Anesthesia Type:General ? ?Level of Consciousness: awake, alert  and oriented ? ?Airway & Oxygen Therapy: Patient Spontanous Breathing ? ?Post-op Assessment: Report given to RN and Post -op Vital signs reviewed and stable ? ?Post vital signs: Reviewed and stable ? ?Last Vitals:  ?Vitals Value Taken Time  ?BP 117/54   ?Temp 98.27f   ?Pulse 75 07/27/21 0610  ?Resp 19   ?SpO2 100 % 07/27/21 0610  ?Vitals shown include unvalidated device data. ? ?Last Pain:  ?Vitals:  ? 07/27/21 0302  ?TempSrc:   ?PainSc: 3   ?   ? ?  ? ?Complications: No notable events documented. ?

## 2021-07-28 DIAGNOSIS — K9131 Postprocedural partial intestinal obstruction: Secondary | ICD-10-CM | POA: Diagnosis not present

## 2021-07-28 DIAGNOSIS — K430 Incisional hernia with obstruction, without gangrene: Secondary | ICD-10-CM | POA: Diagnosis not present

## 2021-07-28 MED ORDER — IBUPROFEN 800 MG PO TABS: 800.0000 mg | ORAL_TABLET | Freq: Three times a day (TID) | ORAL | 0 refills | Status: DC | PRN

## 2021-07-28 NOTE — Progress Notes (Signed)
? ?  To whom it may concern: Maria Fuller was rehospitalized with admission, March 25 and discharged today March 26. ? ?I have placed lifting restrictions on her of no more than 15 pounds for the next 6 weeks. ? ?It is possible she may return to work prior to that time as long as her work restrictions are followed. ? ?She will follow-up with [place] in our office in 2 weeks time, and we will decide at that time if work while maintaining our activity restrictions is feasible. ? ?Feel free to call my office at MD:[phone], for any additional questions. ? ?[name], M.D., FACS ?[facility] ? ?07/28/2021 ; 12:26 PM ? ? ? ?

## 2021-07-28 NOTE — Progress Notes (Signed)
Blood pressure (!) 103/57, pulse (!) 52, temperature 98.1 ?F (36.7 ?C), resp. rate 16, height 6\' 1"  (1.854 m), weight 110.2 kg, SpO2 99 %. ?IV removed site c/d/I, discussed d/c packet with pt she understood information provided. Work note placed in packet and pt transported via w/c down to private car.  ?

## 2021-07-28 NOTE — Discharge Summary (Signed)
?Physician Discharge Summary  ?Patient ID: ?Maria Fuller ?MRN: [id number] ?DOB/AGE: 1985-12-02 36 y.o. ? ?Admit date: 07/27/2021 ?Discharge date: 07/28/2021 ? ?Admission Diagnoses: ? ?Discharge Diagnoses:  ?Principal Problem: ?  Incarcerated incisional hernia ?Small bowel obstruction ? ?Discharged Condition: good ? ?Hospital Course: Admitted for postoperative acute incisional hernia with incarceration and small bowel obstruction.  Underwent robotic suture repair of right lower quadrant trocar site fascial defect of 4 cm.  Pain control improved during time of admission, confidence and ability to tolerate food and maintain pain control and ambulate was obtained prior to discharge. ? ?Consults: None ? ?Significant Diagnostic Studies: radiology: CT scan: See report on day of admission. ? ?Treatments: surgery: Robotic repair of incisional 4 cm fascial defect/hernia, no mesh utilized. ? ?Discharge Exam: ?Blood pressure (!) 103/57, pulse (!) 52, temperature 98.1 ?F (36.7 ?C), resp. rate 16, height 6\' 1"  (1.854 m), weight 110.2 kg, SpO2 99 %. ?General appearance: alert, cooperative, and no distress ?GI: soft, non-tender; bowel sounds normal; no masses,  no organomegaly ?Incision/Wound: Incisions are clean, dry and intact.  No evidence of mass/fluid collection or hematoma at right lower quadrant. ? ?Disposition: Discharge disposition: 01-Home or Self Care ? ? ? ? ? ? ?Discharge Instructions   ? ? Call MD for:  persistant nausea and vomiting   Complete by: As directed ?  ? Call MD for:  redness, tenderness, or signs of infection (pain, swelling, redness, odor or green/yellow discharge around incision site)   Complete by: As directed ?  ? Call MD for:  severe uncontrolled pain   Complete by: As directed ?  ? Diet - low sodium heart healthy   Complete by: As directed ?  ? Discharge wound care:   Complete by: As directed ?  ? Your incision was closed with Dermabond.  It is best to keep it clean and dry, it will tolerate a brief  shower, but do not soak it or apply any creams or lotions to the incisions.  The Dermabond should gradually flake off over time.  Keep it open to air so you can evaluate your incisions.  Dermabond assists the underlying sutures to keep your incision closed and protected from infection.  Should you develop some drainage from your incision, some drops of drainage would be okay but if it persists continue to put keep a dry dressing over it.  ? Driving Restrictions   Complete by: As directed ?  ? No driving until cleared after follow-up appointment.  Is not advised to drive while taking narcotic pain medications or in significant pain.  ? Increase activity slowly   Complete by: As directed ?  ? Lifting restrictions   Complete by: As directed ?  ? Strongly advised against any form of lifting greater than 15 pounds over the next 4 to 6 weeks.  This involves pushing/pulling movements as well.  After 4 weeks when may gradually engage in more activities remaining aware of any new pain/tenderness elicited, and avoiding those for the full duration of 6 weeks.  Walking is encouraged.  Climbing stairs with caution.  ? ?  ? ?Allergies as of 07/28/2021   ? ?   Reactions  ? Ceftin [cefuroxime Axetil] Rash  ? Tolerated cefazolin before  ? Penicillins Rash  ? TOLERATED CEFAZOLIN BEFORE ?Has patient had a PCN reaction causing immediate rash, facial/tongue/throat swelling, SOB or lightheadedness with hypotension: Yes ?Has patient had a PCN reaction causing severe rash involving mucus membranes or skin necrosis: No ?Has patient had  a PCN reaction that required hospitalization No ?Has patient had a PCN reaction occurring within the last 10 years: No ?If all of the above answers are "NO", then may proceed with Cephalosporin use.  ? ?  ? ?  ?Medication List  ?  ? ?TAKE these medications   ? ?buPROPion 150 MG 24 hr tablet ?Commonly known as: WELLBUTRIN XL ?Take 150 mg by mouth daily. ?  ?colestipol 1 g tablet ?Commonly known as:  COLESTID ?Take 4 tablets (4 g total) by mouth 2 (two) times daily. ?  ?ibuprofen 800 MG tablet ?Commonly known as: ADVIL ?Take 1 tablet (800 mg total) by mouth every 8 (eight) hours as needed. ?  ?levonorgestrel 20 MCG/24HR IUD ?Commonly known as: MIRENA ?1 each by Intrauterine route once. ?  ?Nitroglycerin 0.4 % Oint ?Place 1 application rectally 3 (three) times daily. ?  ?ondansetron 4 MG disintegrating tablet ?Commonly known as: ZOFRAN-ODT ?Take 1 tablet (4 mg total) by mouth every 8 (eight) hours as needed for up to 5 days. ?  ?oxyCODONE 5 MG immediate release tablet ?Commonly known as: Oxy IR/ROXICODONE ?Take by mouth. ?  ?oxyCODONE-acetaminophen 5-325 MG tablet ?Commonly known as: PERCOCET/ROXICET ?Take 1 tablet by mouth every 4 (four) hours as needed for moderate pain. ?What changed: Another medication with the same name was removed. Continue taking this medication, and follow the directions you see here. ?  ?sertraline 100 MG tablet ?Commonly known as: ZOLOFT ?Take 200 mg by mouth at bedtime. ?  ?traZODone 50 MG tablet ?Commonly known as: DESYREL ?Take 50 mg by mouth at bedtime. ?  ? ?  ? ?  ?  ? ? ?  ?Discharge Care Instructions  ?(From admission, onward)  ?  ? ? ?  ? ?  Start     Ordered  ? 07/28/21 0000  Discharge wound care:       ?Comments: Your incision was closed with Dermabond.  It is best to keep it clean and dry, it will tolerate a brief shower, but do not soak it or apply any creams or lotions to the incisions.  The Dermabond should gradually flake off over time.  Keep it open to air so you can evaluate your incisions.  Dermabond assists the underlying sutures to keep your incision closed and protected from infection.  Should you develop some drainage from your incision, some drops of drainage would be okay but if it persists continue to put keep a dry dressing over it.  ? 07/28/21 1130  ? ?  ?  ? ?  ? ? Follow-up Information   ? ? Donovan Kail, PA-C Follow up in 2 week(s).   ?Specialty:  Physician Assistant ?Contact information: ?[address] ?[address] ?[place] [address] ?[phone] ? ? ?  ?  ? ?  ?  ? ?  ? ? ?Signed: ?Campbell Lerner, M.D., FACS ?[facility] ?07/28/2021, 11:32 AM ?  ?

## 2021-07-28 NOTE — Plan of Care (Signed)
?  Problem: Clinical Measurements: ?Goal: Will remain free from infection ?07/28/2021 0552 by Balinda Quails, LPN ?Outcome: Progressing ?07/28/2021 0211 by Balinda Quails, LPN ?Outcome: Progressing ?Goal: Diagnostic test results will improve ?07/28/2021 0552 by Balinda Quails, LPN ?Outcome: Progressing ?07/28/2021 0211 by Balinda Quails, LPN ?Outcome: Progressing ?  ?Problem: Activity: ?Goal: Risk for activity intolerance will decrease ?Outcome: Progressing ?  ?Problem: Pain Managment: ?Goal: General experience of comfort will improve ?Outcome: Progressing ?  ?Problem: Education: ?Goal: Knowledge of General Education information will improve ?Description: Including pain rating scale, medication(s)/side effects and non-pharmacologic comfort measures ?Outcome: Completed/Met ?  ?Problem: Health Behavior/Discharge Planning: ?Goal: Ability to manage health-related needs will improve ?Outcome: Completed/Met ?  ?Problem: Clinical Measurements: ?Goal: Ability to maintain clinical measurements within normal limits will improve ?07/28/2021 0552 by Balinda Quails, LPN ?Outcome: Completed/Met ?07/28/2021 0211 by Balinda Quails, LPN ?Outcome: Progressing ?  ?Problem: Nutrition: ?Goal: Adequate nutrition will be maintained ?Outcome: Completed/Met ?  ?Problem: Coping: ?Goal: Level of anxiety will decrease ?Outcome: Completed/Met ?  ?Problem: Elimination: ?Goal: Will not experience complications related to bowel motility ?Outcome: Completed/Met ?Goal: Will not experience complications related to urinary retention ?Outcome: Completed/Met ?  ?Problem: Safety: ?Goal: Ability to remain free from injury will improve ?Outcome: Completed/Met ?  ?Problem: Skin Integrity: ?Goal: Risk for impaired skin integrity will decrease ?Outcome: Completed/Met ?  ?

## 2021-07-28 NOTE — TOC Transition Note (Signed)
Transition of Care (TOC) - CM/SW Discharge Note ? ? ?Patient Details  ?Name: Maria Fuller ?MRN: [id number] ?Date of Birth: 03-21-1986 ? ?Transition of Care (TOC) CM/SW Contact:  ?Bing Quarry, RN ?Phone Number: ?07/28/2021, 12:55 PM ? ? ?Clinical Narrative:  3/26: Patient discharging today to home/self care. No TOC need identified in current discharge plan. Gabriel Cirri RN CM   ? ? ? ?Final next level of care: Home/Self Care ?Barriers to Discharge: Barriers Resolved ? ? ?Patient Goals and CMS Choice ?  ?  ?Choice offered to / list presented to : NA ? ?Discharge Placement ?  ?           ?  ?  ?  ?  ? ?Discharge Plan and Services ?  ?  ?           ?DME Arranged: N/A ?DME Agency: NA ?  ?  ?  ?HH Arranged: NA ?HH Agency: NA ?  ?  ?  ? ?Social Determinants of Health (SDOH) Interventions ?  ? ? ?Readmission Risk Interventions ?   ? View : No data to display.  ?  ?  ?  ? ? ? ? ? ?

## 2021-07-29 ENCOUNTER — Encounter: Payer: Self-pay | Admitting: Surgery

## 2021-07-29 LAB — SURGICAL PATHOLOGY

## 2021-07-29 NOTE — Anesthesia Postprocedure Evaluation (Signed)
Anesthesia Post Note ? ?Patient: Maria Fuller ? ?Procedure(s) Performed: XI ROBOTIC ASSISTED INCARCERATED INCISIONAL HERNIA REPAIR (Abdomen) ? ?Patient location during evaluation: PACU ?Anesthesia Type: General ?Level of consciousness: awake and alert ?Pain management: pain level controlled ?Vital Signs Assessment: post-procedure vital signs reviewed and stable ?Respiratory status: spontaneous breathing, nonlabored ventilation, respiratory function stable and patient connected to nasal cannula oxygen ?Cardiovascular status: blood pressure returned to baseline and stable ?Postop Assessment: no apparent nausea or vomiting ?Anesthetic complications: no ? ? ?No notable events documented. ? ? ?Last Vitals:  ?Vitals:  ? 07/28/21 0459 07/28/21 0837  ?BP: (!) 97/59 (!) 103/57  ?Pulse: (!) 58 (!) 52  ?Resp: 16 16  ?Temp: 36.6 ?C 36.7 ?C  ?SpO2: 97% 99%  ?  ?Last Pain:  ?Vitals:  ? 07/28/21 0907  ?TempSrc:   ?PainSc: 3   ? ? ?  ?  ?  ?  ?  ?  ? ?[name] ? ? ? ? ?

## 2021-07-31 ENCOUNTER — Ambulatory Visit (INDEPENDENT_AMBULATORY_CARE_PROVIDER_SITE_OTHER): Payer: BC Managed Care – PPO | Admitting: Obstetrics & Gynecology

## 2021-07-31 ENCOUNTER — Encounter: Payer: Self-pay | Admitting: Obstetrics & Gynecology

## 2021-07-31 VITALS — BP 90/64 | Ht 73.0 in | Wt 252.0 lb

## 2021-07-31 DIAGNOSIS — N83201 Unspecified ovarian cyst, right side: Secondary | ICD-10-CM

## 2021-07-31 NOTE — Progress Notes (Signed)
?  Postoperative Follow-up ?Patient presents post op from laparoscopy and RSO  for pelvic pain and cyst , 1 week ago.  Also had hernia repair one day later. ? ?Subjective: ?Patient reports some improvement in her preop symptoms. Eating a regular diet without difficulty. Pain is controlled with current analgesics. Medications being used: ibuprofen (OTC) and narcotic analgesics including Percocet.  Activity: normal activities of daily living. Patient reports additional symptom's since surgery of None. ? ?Objective: ?BP 90/64   Ht 6\' 1"  (1.854 m)   Wt 252 lb (114.3 kg)   BMI 33.25 kg/m?  ?Physical Exam ?Constitutional:   ?   General: She is not in acute distress. ?   Appearance: She is well-developed.  ?Cardiovascular:  ?   Rate and Rhythm: Normal rate.  ?Pulmonary:  ?   Effort: Pulmonary effort is normal.  ?Abdominal:  ?   General: There is no distension.  ?   Palpations: Abdomen is soft.  ?   Tenderness: There is no abdominal tenderness.  ?   Comments: Incision Healing Well ?  ?Musculoskeletal:     ?   General: Normal range of motion.  ?Neurological:  ?   Mental Status: She is alert and oriented to person, place, and time.  ?   Cranial Nerves: No cranial nerve deficit.  ?Skin: ?   General: Skin is warm and dry.  ? ? ?Assessment: ?s/p :  laparoscopy and RSO (Recurrent cyst)  progressing well ? ?Plan: ?Patient has done well after surgery with no apparent complications.  I have discussed the post-operative course to date, and the expected progress moving forward.  The patient understands what complications to be concerned about.  I will see the patient in routine follow up, or sooner if needed.   ? ?Activity plan: No restriction. ? ? ?07/31/2021, 4:41 PM ? ? ?

## 2021-08-13 ENCOUNTER — Encounter: Payer: Self-pay | Admitting: Physician Assistant

## 2021-08-13 ENCOUNTER — Ambulatory Visit (INDEPENDENT_AMBULATORY_CARE_PROVIDER_SITE_OTHER): Payer: BC Managed Care – PPO | Admitting: Physician Assistant

## 2021-08-13 VITALS — BP 122/73 | HR 63 | Temp 98.3°F | Ht 73.0 in | Wt 242.4 lb

## 2021-08-13 DIAGNOSIS — Z09 Encounter for follow-up examination after completed treatment for conditions other than malignant neoplasm: Secondary | ICD-10-CM

## 2021-08-13 DIAGNOSIS — K430 Incisional hernia with obstruction, without gangrene: Secondary | ICD-10-CM | POA: Diagnosis not present

## 2021-08-13 MED ORDER — OXYCODONE-ACETAMINOPHEN 5-325 MG PO TABS: 1.0000 | ORAL_TABLET | Freq: Four times a day (QID) | ORAL | 0 refills | Status: DC | PRN

## 2021-08-13 NOTE — Patient Instructions (Signed)
Please pick up your prescription at your pharmacy.  ? ? ?If you have any concerns or questions, please feel free to call our office. See follow up appointment below.  ? ?Hernia, Adult ?  ?A hernia happens when an organ or tissue inside your body pushes out through a weak spot in the muscles of your belly (abdomen). This makes a bulge. The bulge may be: ?In a scar from a surgery that was done in your belly (incisional hernia). ?Near your belly button (umbilical hernia). ?In your groin (inguinal hernia). Your groin is the area where your leg meets your lower belly. If you are a female, this type could also be in your scrotum. ?In your upper thigh (femoral hernia). ?Inside your belly (hiatal hernia). This happens when your stomach slides above the muscle between your belly and your chest (diaphragm). ?What are the causes? ?This condition may be caused by: ?Lifting heavy things. ?Coughing over a long period of time. ?Having trouble pooping (constipation). Trouble pooping can lead to straining. ?A cut from surgery in your belly. ?A physical problem that is present at birth. ?Being very overweight. ?Smoking. ?Too much fluid in your belly. ?A testicle that has not moved down into the scrotum, in males. ?What are the signs or symptoms? ?The main symptom is a bulge in the area of the hernia, but a bulge may not always be seen. It may grow bigger or be easier to see when you cough or strain (such as when lifting something heavy). ?A hernia that can be pushed back into the belly rarely causes pain. A hernia that cannot be pushed back into the belly may lose its blood supply. This may cause: ?Pain. ?Fever. ?A feeling like you may vomit, and vomiting. ?Swelling. ?Trouble pooping. ?How is this treated? ?A hernia that is small and painless may not need to be treated. A hernia that is large or painful may be treated with surgery. ?Surgery to treat a hernia involves pushing the bulge back into place and repairing the weak area of the  muscle or belly. ?Follow these instructions at home: ?Activity ?Avoid straining the muscles near your hernia. This can happen when you: ?Lift something heavy. ?Poop (have a bowel movement). ?Do not lift anything that is heavier than 10 lb (4.5 kg), or the limit that you are told. ?When you lift something heavy, use your leg muscles. Do not use your back muscles to lift. ?Prevent trouble pooping ?If told by your doctor, take steps to prevent trouble pooping. You may need to: ?Drink enough fluid to keep your pee (urine) pale yellow. ?Take medicines. You will be told what medicines to take. ?Eat foods that are high in fiber. These include beans, whole grains, and fresh fruits and vegetables. ?Limit foods that are high in fat and sugar. These include fried or sweet foods. ?General instructions ?When you cough, try to cough gently. ?You may try to push your hernia back in by gently pressing on it when you are lying down. Do not try to force the bulge back in if it will not go in easily. ?If you are overweight, work with your doctor to lose weight safely. ?Do not smoke or use any products that contain nicotine or tobacco. If you need help quitting, ask your doctor. ?If you will be having surgery, watch your hernia for changes in shape, size, or color. Tell your doctor if you see any changes. ?Take over-the-counter and prescription medicines only as told by your doctor. ?Keep all follow-up  visits. ?Contact a doctor if: ?You get new pain, swelling, or redness near your hernia. ?You poop fewer times in a week than normal. ?You have trouble pooping. ?You have poop that is more dry than normal. ?You have poop that is harder or larger than normal. ?Get help right away if: ?You have a fever or chills. ?You have belly pain that gets worse. ?You feel like you may vomit, or you vomit. ?Your hernia cannot be pushed in by gently pressing on it when you are lying down. ?Your hernia: ?Changes in shape or size. ?Changes color. ?Feels  hard, or it hurts when you touch it. ?These symptoms may be an emergency. Get help right away. Call your local emergency services (911 in the [place] ?Do not wait to see if the symptoms will go away. ?Do not drive yourself to the hospital. ?Summary ?A hernia happens when an organ or tissue inside your body pushes out through a weak spot in the belly muscles. This creates a bulge. ?If your hernia is small and it does not hurt, you may not need treatment. If your hernia is large or it hurts, you may need surgery. ?If you will be having surgery, watch your hernia for changes in shape, size, or color. Tell your doctor about any changes. ?This information is not intended to replace advice given to you by your health care provider. Make sure you discuss any questions you have with your health care provider. ?Document Revised: 11/28/2019 Document Reviewed: 11/28/2019 ?Elsevier Patient Education ? [place]. ? ?

## 2021-08-13 NOTE — Progress Notes (Signed)
[facility] ?POST-OP OFFICE VISIT ? ?08/13/2021 ? ?HPI: ?Maria Fuller is a 36 y.o. female 17 days s/p robotic assisted incisional hernia repair with Dr Claudine Mouton ? ?She was doing very well immediately post-op; however, now with soreness to the right most lateral incision; worse with twisting/moving ?She is using percocet just QHS for sleep ?No fever, chills, nausea, emesis ?Bowel function is baseline  ?No other complaints ? ?Vital signs: ?BP 122/73   Pulse 63   Temp 98.3 ?F (36.8 ?C) (Oral)   Ht 6\' 1"  (1.854 m)   Wt 242 lb 6.4 oz (110 kg)   SpO2 99%   BMI 31.98 kg/m?   ? ?Physical Exam: ?Constitutional: Well appearing female, NAD ?Abdomen: Soft, sore to the right most lateral incision site otherwise non-tender, non-distended, no rebound/guarding ?Skin: Laparoscopic incisions are healing well, no erythema or drainage  ? ?Assessment/Plan: ?This is a 36 y.o. female 17 days s/p robotic assisted incisional hernia repair  ? ? - Pain control prn; small refill of percocet (10 pills ? - Reviewed wound care recommendation ? - Reviewed lifting restrictions; 6 weeks total ? - She will follow up in 2 weeks for re-check  ? ?-- ?31, PA-C ?[facility] ?08/13/2021, 2:07 PM ?M-F: 7am - 4pm ? ?

## 2021-08-15 ENCOUNTER — Other Ambulatory Visit: Payer: Self-pay | Admitting: Physician Assistant

## 2021-08-15 ENCOUNTER — Telehealth: Payer: Self-pay | Admitting: *Deleted

## 2021-08-15 MED ORDER — OXYCODONE-ACETAMINOPHEN 5-325 MG PO TABS: 1.0000 | ORAL_TABLET | Freq: Four times a day (QID) | ORAL | 0 refills | Status: DC | PRN

## 2021-08-15 NOTE — Telephone Encounter (Signed)
Patient called and stated that she is just going to pay out of pocket for percocet but when she went to get it, it cancelled out of her pharmacy's system so she needs to get a new prescription sent back to [facility] in [place]. Please call and advise  ?

## 2021-08-15 NOTE — Telephone Encounter (Signed)
Spoke with the pharmacy and they did confirm that the prescription was deleted by a computer error and that the patient has not picked up any prescriptions. I sent a message to Lynden Oxford, PA-C and he has sent over another prescription. I spoke with the patient and she will check back with the pharmacy in 15-20 minutes.  ?

## 2021-08-16 ENCOUNTER — Encounter: Payer: Self-pay | Admitting: Emergency Medicine

## 2021-08-16 ENCOUNTER — Other Ambulatory Visit: Payer: Self-pay

## 2021-08-16 ENCOUNTER — Emergency Department
Admission: EM | Admit: 2021-08-16 | Discharge: 2021-08-17 | Disposition: A | Discharge status: Home / Self Care | Payer: BC Managed Care – PPO | Attending: Emergency Medicine | Admitting: Emergency Medicine

## 2021-08-16 DIAGNOSIS — E119 Type 2 diabetes mellitus without complications: Secondary | ICD-10-CM | POA: Diagnosis not present

## 2021-08-16 DIAGNOSIS — R1031 Right lower quadrant pain: Secondary | ICD-10-CM | POA: Diagnosis present

## 2021-08-16 DIAGNOSIS — R110 Nausea: Secondary | ICD-10-CM | POA: Insufficient documentation

## 2021-08-16 DIAGNOSIS — R197 Diarrhea, unspecified: Secondary | ICD-10-CM | POA: Insufficient documentation

## 2021-08-16 DIAGNOSIS — E039 Hypothyroidism, unspecified: Secondary | ICD-10-CM | POA: Insufficient documentation

## 2021-08-16 DIAGNOSIS — I10 Essential (primary) hypertension: Secondary | ICD-10-CM | POA: Diagnosis not present

## 2021-08-16 DIAGNOSIS — G8918 Other acute postprocedural pain: Secondary | ICD-10-CM | POA: Insufficient documentation

## 2021-08-16 LAB — COMPREHENSIVE METABOLIC PANEL
ALT: 25 U/L (ref 0–44)
AST: 19 U/L (ref 15–41)
Albumin: 4.2 g/dL (ref 3.5–5.0)
Alkaline Phosphatase: 86 U/L (ref 38–126)
Anion gap: 4 — ABNORMAL LOW (ref 5–15)
BUN: 15 mg/dL (ref 6–20)
CO2: 24 mmol/L (ref 22–32)
Calcium: 8.7 mg/dL — ABNORMAL LOW (ref 8.9–10.3)
Chloride: 111 mmol/L (ref 98–111)
Creatinine, Ser: 0.63 mg/dL (ref 0.44–1.00)
GFR, Estimated: >60 mL/min (ref >60)
Glucose, Bld: 84 mg/dL (ref 70–99)
Potassium: 3.9 mmol/L (ref 3.5–5.1)
Sodium: 139 mmol/L (ref 135–145)
Total Bilirubin: 0.6 mg/dL (ref 0.3–1.2)
Total Protein: 7.2 g/dL (ref 6.5–8.1)

## 2021-08-16 LAB — CBC WITH DIFFERENTIAL/PLATELET
Abs Immature Granulocytes: 0.02 10*3/uL (ref 0.00–0.07)
Basophils Absolute: 0 10*3/uL (ref 0.0–0.1)
Basophils Relative: 1 %
Eosinophils Absolute: 0.3 10*3/uL (ref 0.0–0.5)
Eosinophils Relative: 4 %
HCT: 35.3 % — ABNORMAL LOW (ref 36.0–46.0)
Hemoglobin: 11.2 g/dL — ABNORMAL LOW (ref 12.0–15.0)
Immature Granulocytes: 0 %
Lymphocytes Relative: 35 %
Lymphs Abs: 2.8 10*3/uL (ref 0.7–4.0)
MCH: 29.6 pg (ref 26.0–34.0)
MCHC: 31.7 g/dL (ref 30.0–36.0)
MCV: 93.4 fL (ref 80.0–100.0)
Monocytes Absolute: 0.4 10*3/uL (ref 0.1–1.0)
Monocytes Relative: 5 %
Neutro Abs: 4.4 10*3/uL (ref 1.7–7.7)
Neutrophils Relative %: 55 %
Platelets: 330 10*3/uL (ref 150–400)
RBC: 3.78 MIL/uL — ABNORMAL LOW (ref 3.87–5.11)
RDW: 12.3 % (ref 11.5–15.5)
WBC: 8 10*3/uL (ref 4.0–10.5)
nRBC: 0 % (ref 0.0–0.2)

## 2021-08-16 LAB — URINALYSIS, ROUTINE W REFLEX MICROSCOPIC
Bacteria, UA: NONE SEEN
Bilirubin Urine: NEGATIVE
Glucose, UA: NEGATIVE mg/dL
Ketones, ur: NEGATIVE mg/dL
Leukocytes,Ua: NEGATIVE
Nitrite: NEGATIVE
Protein, ur: NEGATIVE mg/dL
Specific Gravity, Urine: 1.026 (ref 1.005–1.030)
pH: 5 (ref 5.0–8.0)

## 2021-08-16 LAB — POC URINE PREG, ED: Preg Test, Ur: NEGATIVE

## 2021-08-16 MED ORDER — KETOROLAC TROMETHAMINE 15 MG/ML IJ SOLN
15.0000 mg | Freq: Once | INTRAMUSCULAR | Status: AC
  Administered 2021-08-17: 15 mg via INTRAVENOUS
  Filled 2021-08-16: qty 1

## 2021-08-16 MED ORDER — ACETAMINOPHEN 500 MG PO TABS
1000.0000 mg | ORAL_TABLET | Freq: Once | ORAL | Status: AC
  Administered 2021-08-16: 1000 mg via ORAL
  Filled 2021-08-16: qty 2

## 2021-08-16 MED ORDER — ONDANSETRON HCL 4 MG/2ML IJ SOLN
4.0000 mg | Freq: Once | INTRAMUSCULAR | Status: AC
  Administered 2021-08-17: 4 mg via INTRAVENOUS
  Filled 2021-08-16: qty 2

## 2021-08-16 MED ORDER — OXYCODONE HCL 5 MG PO TABS
5.0000 mg | ORAL_TABLET | Freq: Once | ORAL | Status: AC
  Administered 2021-08-16: 5 mg via ORAL
  Filled 2021-08-16: qty 1

## 2021-08-16 NOTE — ED Triage Notes (Signed)
Pt reports about 3 weeks ago she had right ovary and fallopian tube removed, reports the next day she had to had another procedure for a hernia repair as a complication from previous surgery. Reports pain to right side has been gradually increasing and today pain has been the worse per pt. Pt talks in complete sentences no respiratory distress noted.  ?

## 2021-08-16 NOTE — ED Provider Notes (Signed)
? ?[facility] ?Provider Note ? ? ? Event Date/Time  ? First MD Initiated Contact with Patient 08/16/21 2335   ?  (approximate) ? ? ?History  ? ?Post-op Problem ? ? ?HPI ? ?Maria Fuller is a 36 y.o. female   with past medical history of PCOS, diabetes, depression, recent right ovarian cyst removal on 3/23 followed by right incisional hernia repair on 3/25 presents with right lower quadrant pain.  Patient notes that since the beginning of the week she has had worsening pain at the site of the incision from the hernia repair.  She followed up with general surgery on Tuesday and they thought she was healing appropriately.  Pain worsened today.  It is cramping and intermittent.  No clear alleviating or exacerbating factor.  Denies fevers chills urinary symptoms vaginal bleeding.  Did have an episode of diarrhea today no constipation.  She is tolerating p.o. taking oxycodone at night which seems to help somewhat.  Denies fevers. ? ?  ? ?Past Medical History:  ?Diagnosis Date  ? Anemia   ? slight  ? Bradycardia   ? Depression   ? Diabetes mellitus without complication (HCC)   ? resolved with weight loss from gastric bypass  ? Headache   ? migraines. resolved since gastric bypass  ? High cholesterol   ? History of kidney stones   ? last week  ? Hypertension   ? resolved since gastric bypass, severe pre-eclampsia with G1  ? Low BP   ? Meningitis 2015  ? PCOS (polycystic ovarian syndrome)   ? Preterm labor   ? Sleep apnea   ? had gastric bypass surgery and now resolved  ? Syncope   ? Vertigo couple years ago  ? ? ?Patient Active Problem List  ? Diagnosis Date Noted  ? Incarcerated incisional hernia 07/27/2021  ? Right ovarian cyst 09/09/2018  ? Secondary oligomenorrhea 05/25/2018  ? Rectal pain 05/25/2018  ? PCOS (polycystic ovarian syndrome) 05/25/2018  ? Menometrorrhagia 05/25/2018  ? Hydronephrosis 09/22/2016  ? Nephrolithiasis 09/22/2016  ? Left flank pain 12/11/2015  ? Right flank pain  10/06/2015  ? History of hypertension 08/30/2015  ? History of PCOS 08/30/2015  ? Vitamin D deficiency 04/20/2015  ? Diabetes mellitus (HCC) 03/27/2015  ? Dysmenorrhea 05/25/2014  ? Rectal bleeding 05/25/2014  ? Contraception 04/07/2014  ? Hypothyroidism 07/22/2013  ? Major depressive disorder, single episode 07/22/2013  ? HTN (hypertension) 07/15/2013  ? Headache 07/10/2013  ? Anxiety state 03/05/2013  ? Morbid obesity with BMI of 50.0-59.9, adult (HCC) 03/05/2013  ? Preventative health care 03/05/2013  ? History of diabetes mellitus, type II 03/04/2013  ? Obstructive sleep apnea 09/14/2012  ? Hyperlipidemia 07/28/2012  ? ? ? ?Physical Exam  ?Triage Vital Signs: ?ED Triage Vitals  ?Enc Vitals Group  ?   BP 08/16/21 1954 113/67  ?   Pulse Rate 08/16/21 1954 (!) 59  ?   Resp 08/16/21 1954 16  ?   Temp 08/16/21 1954 98.5 ?F (36.9 ?C)  ?   Temp Source 08/16/21 1954 Oral  ?   SpO2 08/16/21 1954 99 %  ?   Weight 08/16/21 1956 241 lb (109.3 kg)  ?   Height 08/16/21 1956 6\' 1"  (1.854 m)  ?   Head Circumference --   ?   Peak Flow --   ?   Pain Score 08/16/21 1953 6  ?   Pain Loc --   ?   Pain Edu? --   ?  Excl. in GC? --   ? ? ?Most recent vital signs: ?Vitals:  ? 08/17/21 0130 08/17/21 0310  ?BP: 109/61 111/69  ?Pulse: (!) 49 (!) 54  ?Resp: (!) 21 20  ?Temp:  98.6 ?F (37 ?C)  ?SpO2: 100% 99%  ? ? ? ?General: Awake, no distress.  ?CV:  Good peripheral perfusion.  ?Resp:  Normal effort.  ?Abd:  No distention.  Mild tenderness in the right lower quadrant specifically at the site of the incision which has some scar tissue around it but overall benign abdominal exam no guarding ?Neuro:             Awake, Alert, Oriented x 3  ?Other:   ? ? ?ED Results / Procedures / Treatments  ?Labs ?(all labs ordered are listed, but only abnormal results are displayed) ?Labs Reviewed  ?CBC WITH DIFFERENTIAL/PLATELET - Abnormal; Notable for the following components:  ?    Result Value  ? RBC 3.78 (*)   ? Hemoglobin 11.2 (*)   ? HCT 35.3 (*)    ? All other components within normal limits  ?COMPREHENSIVE METABOLIC PANEL - Abnormal; Notable for the following components:  ? Calcium 8.7 (*)   ? Anion gap 4 (*)   ? All other components within normal limits  ?URINALYSIS, ROUTINE W REFLEX MICROSCOPIC - Abnormal; Notable for the following components:  ? Color, Urine YELLOW (*)   ? APPearance CLOUDY (*)   ? Hgb urine dipstick SMALL (*)   ? All other components within normal limits  ?POC URINE PREG, ED  ? ? ? ?EKG ? ? ? ? ?RADIOLOGY ?Reviewed patient CT abdomen pelvis which does not show any acute pathology ? ? ?PROCEDURES: ? ?Critical Care performed: No ? ?Procedures ? ? ?MEDICATIONS ORDERED IN ED: ?Medications  ?acetaminophen (TYLENOL) tablet 1,000 mg (1,000 mg Oral Given 08/16/21 2007)  ?oxyCODONE (Oxy IR/ROXICODONE) immediate release tablet 5 mg (5 mg Oral Given 08/16/21 2234)  ?ondansetron [facility](ZOFRAN) injection 4 mg (4 mg Intravenous Given 08/17/21 0115)  ?ketorolac (TORADOL) 15 MG/ML injection 15 mg (15 mg Intravenous Given 08/17/21 0116)  ?iohexol (OMNIPAQUE) 300 MG/ML solution 100 mL (100 mLs Intravenous Contrast Given 08/17/21 0152)  ? ? ? ?IMPRESSION / MDM / ASSESSMENT AND PLAN / ED COURSE  ?I reviewed the triage vital signs and the nursing notes. ?             ?               ? ?Differential diagnosis includes, but is not limited to, postoperative pain, postop complication, incarcerated hernia, appendicitis ? ?Patient is a 36 year old female with recent right ovarian cyst removal which was complicated by incisional hernia status post repair 2 days later presents with pain at the site of hernia repair.  Has been worsening over the past week.  She has had nausea but minimal other associated symptoms.  Vitals within normal limits Labs reviewed by myself with normal CBC CMP and UA.  Pregnancy test negative.  She is tender at the site of the incision which is also in the right lower quadrant.  Due to worsening pain and recent surgical obtain a CT abdomen. ? ? ?CT  abdomen pelvis does not show any acute pathology.  Patient's pain is controlled.  Advised that she follow-up with both OB/GYN and general surgery.   ? ? ?FINAL CLINICAL IMPRESSION(S) / ED DIAGNOSES  ? ?Final diagnoses:  ?Postoperative pain  ? ? ? ?Rx / DC Orders  ? ?ED Discharge  Orders   ? ? None  ? ?  ? ? ? ?Note:  This document was prepared using Dragon voice recognition software and may include unintentional dictation errors. ?  ?Georga Hacking, MD ?08/17/21 [phone] ? ?

## 2021-08-17 ENCOUNTER — Emergency Department: Payer: BC Managed Care – PPO

## 2021-08-17 MED ORDER — IOHEXOL 300 MG/ML  SOLN
100.0000 mL | Freq: Once | INTRAMUSCULAR | Status: AC | PRN
  Administered 2021-08-17: 100 mL via INTRAVENOUS

## 2021-08-17 NOTE — Discharge Instructions (Signed)
Your CAT scan and your blood work were all reassuring.  We did not find an explanation for your pain.  Please follow-up with OB/GYN and general surgery as scheduled.  If you develop any new symptoms such as vomiting fever or worsening pain, please return to the emergency department. ?

## 2021-08-20 ENCOUNTER — Encounter: Payer: BC Managed Care – PPO | Admitting: Obstetrics & Gynecology

## 2021-08-22 ENCOUNTER — Other Ambulatory Visit: Payer: Self-pay

## 2021-08-22 ENCOUNTER — Ambulatory Visit (INDEPENDENT_AMBULATORY_CARE_PROVIDER_SITE_OTHER): Payer: BC Managed Care – PPO | Admitting: Physician Assistant

## 2021-08-22 ENCOUNTER — Encounter: Payer: Self-pay | Admitting: Physician Assistant

## 2021-08-22 VITALS — BP 135/83 | HR 67 | Temp 98.3°F | Ht 73.0 in | Wt 244.2 lb

## 2021-08-22 DIAGNOSIS — Z09 Encounter for follow-up examination after completed treatment for conditions other than malignant neoplasm: Secondary | ICD-10-CM

## 2021-08-22 DIAGNOSIS — K430 Incisional hernia with obstruction, without gangrene: Secondary | ICD-10-CM

## 2021-08-22 MED ORDER — CYCLOBENZAPRINE HCL 5 MG PO TABS: 5.0000 mg | ORAL_TABLET | Freq: Three times a day (TID) | ORAL | 0 refills | Status: DC | PRN

## 2021-08-22 MED ORDER — ONDANSETRON 4 MG PO TBDP: 4.0000 mg | ORAL_TABLET | Freq: Three times a day (TID) | ORAL | 0 refills | Status: DC | PRN

## 2021-08-22 MED ORDER — OXYCODONE-ACETAMINOPHEN 5-325 MG PO TABS: 1.0000 | ORAL_TABLET | Freq: Four times a day (QID) | ORAL | 0 refills | Status: DC | PRN

## 2021-08-22 NOTE — Patient Instructions (Addendum)
Please pick up your medication at the pharmacy.   GENERAL POST-OPERATIVE PATIENT INSTRUCTIONS   WOUND CARE INSTRUCTIONS:  Keep a dry clean dressing on the wound if there is drainage. The initial bandage may be removed after 24 hours.  Once the wound has quit draining you may leave it open to air.  If clothing rubs against the wound or causes irritation and the wound is not draining you may cover it with a dry dressing during the daytime.  Try to keep the wound dry and avoid ointments on the wound unless directed to do so.  If the wound becomes bright red and painful or starts to drain infected material that is not clear, please contact your physician immediately.  If the wound is mildly pink and has a thick firm ridge underneath it, this is normal, and is referred to as a healing ridge.  This will resolve over the next 4-6 weeks.  BATHING: You may shower if you have been informed of this by your surgeon. However, Please do not submerge in a tub, hot tub, or pool until incisions are completely sealed or have been told by your surgeon that you may do so.  DIET:  You may eat any foods that you can tolerate.  It is a good idea to eat a high fiber diet and take in plenty of fluids to prevent constipation.  If you do become constipated you may want to take a mild laxative or take ducolax tablets on a daily basis until your bowel habits are regular.  Constipation can be very uncomfortable, along with straining, after recent surgery.  ACTIVITY:  You are encouraged to cough and deep breath or use your incentive spirometer if you were given one, every 15-30 minutes when awake.  This will help prevent respiratory complications and low grade fevers post-operatively if you had a general anesthetic.  You may want to hug a pillow when coughing and sneezing to add additional support to the surgical area, if you had abdominal or chest surgery, which will decrease pain during these times.  You are encouraged to walk and  engage in light activity for the next two weeks.  You should not lift more than 20 pounds for 6 weeks total after surgery as it could put you at increased risk for complications.  Twenty pounds is roughly equivalent to a plastic bag of groceries. At that time- Listen to your body when lifting, if you have pain when lifting, stop and then try again in a few days. Soreness after doing exercises or activities of daily living is normal as you get back in to your normal routine.  MEDICATIONS:  Try to take narcotic medications and anti-inflammatory medications, such as tylenol, ibuprofen, naprosyn, etc., with food.  This will minimize stomach upset from the medication.  Should you develop nausea and vomiting from the pain medication, or develop a rash, please discontinue the medication and contact your physician.  You should not drive, make important decisions, or operate machinery when taking narcotic pain medication.  SUNBLOCK Use sun block to incision area over the next year if this area will be exposed to sun. This helps decrease scarring and will allow you avoid a permanent darkened area over your incision.  QUESTIONS:  Please feel free to call our office if you have any questions, and we will be glad to assist you. [phone]   

## 2021-08-22 NOTE — Progress Notes (Signed)
[facility] ?POST-OP OFFICE VISIT ? ?08/22/2021 ? ?HPI: ?Maria Fuller is a 36 y.o. female 26 days s/p robotic assisted incisional hernia repair with Dr Christian Mate ? ?Since the last visit.... ?She went to the ED on 04/15 for worsening incisional post-op pain. Work up was unremarkable, including CT Abdomen/Pelvis which I have reviewed, and she discharged home.  ? ?She cntines to have right lower abdominal pain; not getting any better. No fever, chills, nausea, emesis.  ?She is out of pain medications ?She has been having normal stools but lately they have become more watery  ?She is anxious about her symptoms as she has had internal hernias in the past and they all present in this way. Again, she is having bowel function  ?No issues with incisions themselves.  ? ?Vital signs: ?BP 135/83   Pulse 67   Temp 98.3 ?F (36.8 ?C) (Oral)   Ht 6\' 1"  (1.854 m)   Wt 244 lb 3.2 oz (110.8 kg)   LMP  (LMP Unknown)   SpO2 98%   BMI 32.22 kg/m?   ? ?Physical Exam: ?Constitutional: Tearful, NAD ?Abdomen: Soft, she is tender to deep palpation in the RLQ,  non-distended, no rebound/guarding. She is certainly not peritonitic.  ?Skin: Laparoscopic incisions are well healed  ? ? ?Imaging Studies Reviewed:  ? ?CT Abdomen/Pelvis (08/17/2021) personally reviewed without gross intra-abdominal pathology, some inflammation at incisions, no evidence of infection, and radiologist report reviewed below:  ?IMPRESSION: ?No acute intra-abdominal pathology identified. No definite ?radiographic explanation for the patient's reported symptoms. Normal ?appendix. ?  ?Moderate left nonobstructing nephrolithiasis. ?  ?Postsurgical changes of right lower quadrant incisional hernia ?repair. No recurrent hernia identified. ?  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 No evidence of obstruction. ?  ?Intrauterine device in expected position. ?  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 ? ? ?Assessment/Plan: ?This is a 36 y.o. female with persistent abdominal pain 26 days s/p robotic assisted incisional hernia repair  ? ? - I have a very low suspicion for internal hernia at this time; although, this is certainly not out of the question given her history of the same and bariatric surgical history. Again, her CT from 04/14 did not show evidence of this, and I do not feel she warratns repeating this at the moment. I did discuss with her that if she fails to improve or worsens over the weekend to go to the ED or call the office on Monday and we can repeat imaging at that time.  ? ? - Pain control prn; small refill of percocet. Will also try flexeril ? - Antiemetic; Zofran ? - Reviewed wound care recommendation ? - Reviewed lifting restrictions; 6 weeks total ? - I will see her again next week ? ?-- ?Edison Simon, PA-C ?[facility] ?08/22/2021, 3:07 PM ?M-F: 7am - 4pm ? ?

## 2021-08-26 ENCOUNTER — Telehealth: Payer: Self-pay

## 2021-08-26 ENCOUNTER — Other Ambulatory Visit: Payer: Self-pay

## 2021-08-26 DIAGNOSIS — Z09 Encounter for follow-up examination after completed treatment for conditions other than malignant neoplasm: Secondary | ICD-10-CM

## 2021-08-26 NOTE — Progress Notes (Unsigned)
CT scheduled 08/29/21 @ 3:30 at [facility].  Please pick up prep kit. Nothing to eat/drink 4 hours prior to the scan.  ? ? ?

## 2021-08-26 NOTE — Telephone Encounter (Signed)
CT Scheduled 08/29/21 @ 3:30- @ [facility]-Please pick up prep kit. Nothing to eat/drink 4 hors prior. Please go today to pick up prep kit and instructions.  ?Cancelled appointment with Thedore Mins for tomorrow-we will call patient with CT results.  ?

## 2021-08-27 ENCOUNTER — Encounter: Payer: BC Managed Care – PPO | Admitting: Physician Assistant

## 2021-08-27 ENCOUNTER — Ambulatory Visit: Payer: BC Managed Care – PPO | Admitting: Physician Assistant

## 2021-08-29 ENCOUNTER — Ambulatory Visit: Payer: BC Managed Care – PPO

## 2021-08-30 ENCOUNTER — Telehealth: Payer: Self-pay

## 2021-09-02 ENCOUNTER — Emergency Department: Payer: BC Managed Care – PPO

## 2021-09-02 ENCOUNTER — Emergency Department
Admission: EM | Admit: 2021-09-02 | Discharge: 2021-09-02 | Disposition: A | Discharge status: Home / Self Care | Payer: BC Managed Care – PPO | Attending: Emergency Medicine | Admitting: Emergency Medicine

## 2021-09-02 ENCOUNTER — Other Ambulatory Visit: Payer: Self-pay

## 2021-09-02 DIAGNOSIS — N838 Other noninflammatory disorders of ovary, fallopian tube and broad ligament: Secondary | ICD-10-CM

## 2021-09-02 DIAGNOSIS — N839 Noninflammatory disorder of ovary, fallopian tube and broad ligament, unspecified: Secondary | ICD-10-CM | POA: Diagnosis not present

## 2021-09-02 DIAGNOSIS — R1031 Right lower quadrant pain: Secondary | ICD-10-CM | POA: Diagnosis present

## 2021-09-02 DIAGNOSIS — E119 Type 2 diabetes mellitus without complications: Secondary | ICD-10-CM | POA: Insufficient documentation

## 2021-09-02 LAB — COMPREHENSIVE METABOLIC PANEL
ALT: 28 U/L (ref 0–44)
AST: 20 U/L (ref 15–41)
Albumin: 4.1 g/dL (ref 3.5–5.0)
Alkaline Phosphatase: 85 U/L (ref 38–126)
Anion gap: 4 — ABNORMAL LOW (ref 5–15)
BUN: 10 mg/dL (ref 6–20)
CO2: 25 mmol/L (ref 22–32)
Calcium: 8.3 mg/dL — ABNORMAL LOW (ref 8.9–10.3)
Chloride: 109 mmol/L (ref 98–111)
Creatinine, Ser: 0.51 mg/dL (ref 0.44–1.00)
GFR, Estimated: >60 mL/min (ref >60)
Glucose, Bld: 90 mg/dL (ref 70–99)
Potassium: 3.8 mmol/L (ref 3.5–5.1)
Sodium: 138 mmol/L (ref 135–145)
Total Bilirubin: 0.5 mg/dL (ref 0.3–1.2)
Total Protein: 7.1 g/dL (ref 6.5–8.1)

## 2021-09-02 LAB — CBC
HCT: 36.7 % (ref 36.0–46.0)
Hemoglobin: 11.5 g/dL — ABNORMAL LOW (ref 12.0–15.0)
MCH: 29.4 pg (ref 26.0–34.0)
MCHC: 31.3 g/dL (ref 30.0–36.0)
MCV: 93.9 fL (ref 80.0–100.0)
Platelets: 308 10*3/uL (ref 150–400)
RBC: 3.91 MIL/uL (ref 3.87–5.11)
RDW: 12.9 % (ref 11.5–15.5)
WBC: 8 10*3/uL (ref 4.0–10.5)
nRBC: 0 % (ref 0.0–0.2)

## 2021-09-02 LAB — URINALYSIS, ROUTINE W REFLEX MICROSCOPIC
Bilirubin Urine: NEGATIVE
Glucose, UA: NEGATIVE mg/dL
Hgb urine dipstick: NEGATIVE
Ketones, ur: NEGATIVE mg/dL
Leukocytes,Ua: NEGATIVE
Nitrite: NEGATIVE
Protein, ur: NEGATIVE mg/dL
Specific Gravity, Urine: 1.015 (ref 1.005–1.030)
pH: 5 (ref 5.0–8.0)

## 2021-09-02 LAB — LIPASE, BLOOD: Lipase: 29 U/L (ref 11–51)

## 2021-09-02 LAB — PREGNANCY, URINE: Preg Test, Ur: NEGATIVE

## 2021-09-02 MED ORDER — ONDANSETRON 4 MG PO TBDP
4.0000 mg | ORAL_TABLET | Freq: Once | ORAL | Status: AC
  Administered 2021-09-02: 4 mg via ORAL
  Filled 2021-09-02: qty 1

## 2021-09-02 MED ORDER — OXYCODONE-ACETAMINOPHEN 5-325 MG PO TABS: 1.0000 | ORAL_TABLET | Freq: Four times a day (QID) | ORAL | 0 refills | Status: AC | PRN

## 2021-09-02 MED ORDER — OXYCODONE-ACETAMINOPHEN 5-325 MG PO TABS
1.0000 | ORAL_TABLET | Freq: Once | ORAL | Status: AC
  Administered 2021-09-02: 1 via ORAL
  Filled 2021-09-02: qty 1

## 2021-09-02 MED ORDER — ONDANSETRON 4 MG PO TBDP: 4.0000 mg | ORAL_TABLET | Freq: Three times a day (TID) | ORAL | 0 refills | Status: AC | PRN

## 2021-09-02 MED ORDER — IOHEXOL 300 MG/ML  SOLN
100.0000 mL | Freq: Once | INTRAMUSCULAR | Status: AC | PRN
  Administered 2021-09-02: 100 mL via INTRAVENOUS
  Filled 2021-09-02: qty 100

## 2021-09-02 NOTE — ED Provider Notes (Signed)
? ?[facility] ?Provider Note ? ?Patient Contact: 6:55 PM (approximate) ? ? ?History  ? ?Post-op Problem ? ? ?HPI ? ?Maria Fuller is a 36 y.o. female with a history of PCOS, gastric bypass, diabetes, depression and right laparoscopic salpingo oophorectomy on 3/23 followed by right incisional hernia repair on 3/25 presents with persistent right lower quadrant abdominal pain.  Patient also had worsening abdominal pain on 4/15 seen in the ED. Work-up was unremarkable at that time which also included a CT abdomen pelvis. Patient was discharged home.  Patient has since been evaluated by her general surgery office by Otho Ket.  At last general surgery office visit on 4/20, patient reported continued right lower abdominal pain without fever, nausea or emesis.  She has not been having any problems with her incisions.  Patient was cautioned that should her pain worsen to seek care in emergency department for repeat CT. ? ?  ? ? ?Physical Exam  ? ?Triage Vital Signs: ?ED Triage Vitals  ?Enc Vitals Group  ?   BP 09/02/21 1538 139/79  ?   Pulse Rate 09/02/21 1538 67  ?   Resp 09/02/21 1538 17  ?   Temp 09/02/21 1538 98.2 ?F (36.8 ?C)  ?   Temp Source 09/02/21 1538 Oral  ?   SpO2 09/02/21 1538 99 %  ?   Weight 09/02/21 1539 249 lb (112.9 kg)  ?   Height 09/02/21 1539 6\' 1"  (1.854 m)  ?   Head Circumference --   ?   Peak Flow --   ?   Pain Score 09/02/21 1539 6  ?   Pain Loc --   ?   Pain Edu? --   ?   Excl. in [place]? --   ? ? ?Most recent vital signs: ?Vitals:  ? 09/02/21 1849 09/02/21 2201  ?BP: 130/80 128/74  ?Pulse: 68 70  ?Resp: 16 17  ?Temp:    ?SpO2: 99% 99%  ? ? ? ?General: Alert and in no acute distress. ?Eyes:  PERRL. EOMI. ?Head: No acute traumatic findings ?ENT: ?     Nose: No congestion/rhinnorhea. ?     Mouth/Throat: Mucous membranes are moist.  ?Neck: No stridor. No cervical spine tenderness to palpation. ?Cardiovascular:  Good peripheral perfusion ?Respiratory: Normal respiratory effort  without tachypnea or retractions. Lungs CTAB. Good air entry to the bases with no decreased or absent breath sounds. ?Gastrointestinal: Bowel sounds ?4 quadrants. Soft and nontender to palpation. No guarding or rigidity. No palpable masses. No distention. No CVA tenderness. ?Musculoskeletal: Full range of motion to all extremities.  ?Neurologic:  No gross focal neurologic deficits are appreciated.  ?Skin:   No rash noted ? ? ? ?ED Results / Procedures / Treatments  ? ?Labs ?(all labs ordered are listed, but only abnormal results are displayed) ?Labs Reviewed  ?COMPREHENSIVE METABOLIC PANEL - Abnormal; Notable for the following components:  ?    Result Value  ? Calcium 8.3 (*)   ? Anion gap 4 (*)   ? All other components within normal limits  ?CBC - Abnormal; Notable for the following components:  ? Hemoglobin 11.5 (*)   ? All other components within normal limits  ?URINALYSIS, ROUTINE W REFLEX MICROSCOPIC - Abnormal; Notable for the following components:  ? Color, Urine YELLOW (*)   ? APPearance CLEAR (*)   ? All other components within normal limits  ?LIPASE, BLOOD  ?PREGNANCY, URINE  ?POC URINE PREG, ED  ? ? ? ? ? ?  RADIOLOGY ? ?I personally viewed and evaluated these images as part of my medical decision making, as well as reviewing the written report by the radiologist. ? ?ED Provider Interpretation: I personally reviewed pelvic ultrasound and agree with radiologist interpretation.  Patient has a 6 cm avascular ovarian mass. ? ? ?PROCEDURES: ? ?Critical Care performed: No ? ?Procedures ? ? ?MEDICATIONS ORDERED IN ED: ?Medications  ?oxyCODONE-acetaminophen (PERCOCET/ROXICET) 5-325 MG per tablet 1 tablet (has no administration in time range)  ?ondansetron (ZOFRAN-ODT) disintegrating tablet 4 mg (has no administration in time range)  ?iohexol (OMNIPAQUE) 300 MG/ML solution 100 mL (100 mLs Intravenous Contrast Given 09/02/21 2038)  ? ? ? ?IMPRESSION / MDM / ASSESSMENT AND PLAN / ED COURSE  ?I reviewed the triage vital  signs and the nursing notes. ?             ?               ?Assessment and plan: ?Ovarian Mass:  ?Differential diagnosis includes, but is not limited to, ovarian cyst, ovarian torsion, ovarian mass, endometrioma ? ?Vital signs were reassuring at triage.  On physical exam, patient was alert, active and nontoxic-appearing. ? ?On physical exam, abdomen was soft and nontender without guarding.  I ordered repeat CT abdomen pelvis which visualized a possible left-sided ovarian mass and recommended further characterization with ultrasound dedicated pelvic ultrasound visualize a 6 cm left-sided avascular mass questionable for hemorrhagic cyst versus endometrioma.  I reached out to OB/GYN on-call, Dr. Marcelline Mates as patient was previously under the care of Dr. [name] and does not have an established gynecologist.  She plans to see patient as an outpatient.  I did prescribe patient a short course of Percocet for pain until she can be evaluated by gynecology.  Return precautions were given to return with new or worsening symptoms. ?  ? ? ?FINAL CLINICAL IMPRESSION(S) / ED DIAGNOSES  ? ?Final diagnoses:  ?Ovarian mass  ? ? ? ?Rx / DC Orders  ? ?ED Discharge Orders   ? ?      Ordered  ?  oxyCODONE-acetaminophen (PERCOCET/ROXICET) 5-325 MG tablet  Every 6 hours PRN       ? 09/02/21 2333  ?  ondansetron (ZOFRAN-ODT) 4 MG disintegrating tablet  Every 8 hours PRN       ? 09/02/21 2333  ? ?  ?  ? ?  ? ? ? ?Note:  This document was prepared using Dragon voice recognition software and may include unintentional dictation errors. ?  ?Lannie Fields, PA-C ?09/02/21 2336 ? ?  ?Lucrezia Starch, MD ?09/02/21 2340 ? ?

## 2021-09-02 NOTE — Telephone Encounter (Signed)
Error

## 2021-09-02 NOTE — ED Triage Notes (Signed)
Pt states she had hernia repair surgery 5 weeks ago and has been having increased abd pain since, was seen here 4/14 for the same, and has had a follow up with surgeon but is waiting on insurance to do a CT scan ?

## 2021-09-03 ENCOUNTER — Ambulatory Visit: Payer: BC Managed Care – PPO | Admitting: Family Medicine

## 2021-09-03 ENCOUNTER — Encounter: Payer: Self-pay | Admitting: Family Medicine

## 2021-09-03 VITALS — BP 138/80 | Ht 73.0 in | Wt 246.0 lb

## 2021-09-03 DIAGNOSIS — N83202 Unspecified ovarian cyst, left side: Secondary | ICD-10-CM | POA: Diagnosis not present

## 2021-09-03 NOTE — Progress Notes (Signed)
? ?  GYNECOLOGY PROBLEM  VISIT ENCOUNTER NOTE ? ?Subjective:  ? Maria Fuller is a 36 y.o. G31P1102 female here for a problem GYN visit.  Current complaints: follow up ovarian cyst.   Denies abnormal vaginal bleeding, discharge, pelvic pain, problems with intercourse or other gynecologic concerns.  ?  ?Gynecologic History ?No LMP recorded (lmp unknown). (Menstrual status: IUD). ? ?Contraception: IUD ? ?Health Maintenance Due  ?Topic Date Due  ? FOOT EXAM  Never done  ? OPHTHALMOLOGY EXAM  Never done  ? URINE MICROALBUMIN  Never done  ? HIV Screening  Never done  ? Hepatitis C Screening  Never done  ? TETANUS/TDAP  Never done  ? HEMOGLOBIN A1C  04/06/2016  ? PAP SMEAR-Modifier  05/23/2019  ? COVID-19 Vaccine (3 - Booster for Pfizer series) 02/27/2020  ? ? ?The following portions of the patient's history were reviewed and updated as appropriate: allergies, current medications, past family history, past medical history, past social history, past surgical history and problem list. ? ?Review of Systems ?Pertinent items are noted in HPI. ?  ?Objective:  ?BP 138/80   Ht 6\' 1"  (1.854 m)   Wt 246 lb (111.6 kg)   LMP  (LMP Unknown)   BMI 32.46 kg/m?  ?Gen: well appearing, NAD ?HEENT: no scleral icterus ?CV: RR ?Lung: Normal WOB ?Ext: warm well perfused ? ?Assessment and Plan:  ?1. Cyst of left ovary ?Discussed 6cm left cyst and risk of torsion. Voiced understanding of when to go to ER ?We will conservatively manage with in 6-8 weeks ?Recommend appt with GYN surgeon after [place] to discuss cyst removal. Patient has only left ovary present and also significant surgical history of recent duodenal switch. ?- US PELVIC COMPLETE WITH TRANSVAGINAL; Future ? ? ?Please refer to After Visit Summary for other counseling recommendations.  ? ?Return in about 8 weeks (around 10/29/2021) for after 10/31/2021 with GYN surgeon. ? ?Future Appointments  ?Date Time Provider Department Center  ?10/17/2021  8:00 AM [facility] 10/19/2021 2 [facility] None  ?11/01/2021   8:55 AM 11/03/2021, MD [facility] None  ? ? ? ?Federico Flake, MD, MPH, ABFM ?Attending Physician ?Faculty Practice- [facility] ? ?

## 2021-09-06 ENCOUNTER — Encounter: Payer: Self-pay | Admitting: Family Medicine

## 2021-09-24 ENCOUNTER — Other Ambulatory Visit: Payer: Self-pay

## 2021-09-24 ENCOUNTER — Emergency Department
Admission: EM | Admit: 2021-09-24 | Discharge: 2021-09-24 | Disposition: A | Discharge status: Home / Self Care | Payer: BC Managed Care – PPO | Attending: Emergency Medicine | Admitting: Emergency Medicine

## 2021-09-24 ENCOUNTER — Encounter: Payer: Self-pay | Admitting: Emergency Medicine

## 2021-09-24 ENCOUNTER — Emergency Department: Payer: BC Managed Care – PPO

## 2021-09-24 DIAGNOSIS — N200 Calculus of kidney: Secondary | ICD-10-CM | POA: Diagnosis not present

## 2021-09-24 DIAGNOSIS — R102 Pelvic and perineal pain: Secondary | ICD-10-CM

## 2021-09-24 DIAGNOSIS — N83202 Unspecified ovarian cyst, left side: Secondary | ICD-10-CM | POA: Insufficient documentation

## 2021-09-24 DIAGNOSIS — R1030 Lower abdominal pain, unspecified: Secondary | ICD-10-CM | POA: Diagnosis present

## 2021-09-24 LAB — URINALYSIS, ROUTINE W REFLEX MICROSCOPIC
Bilirubin Urine: NEGATIVE
Glucose, UA: NEGATIVE mg/dL
Hgb urine dipstick: NEGATIVE
Ketones, ur: NEGATIVE mg/dL
Leukocytes,Ua: NEGATIVE
Nitrite: NEGATIVE
Protein, ur: NEGATIVE mg/dL
Specific Gravity, Urine: 1.026 (ref 1.005–1.030)
pH: 5 (ref 5.0–8.0)

## 2021-09-24 LAB — CBC
HCT: 32.1 % — ABNORMAL LOW (ref 36.0–46.0)
Hemoglobin: 10.5 g/dL — ABNORMAL LOW (ref 12.0–15.0)
MCH: 30.2 pg (ref 26.0–34.0)
MCHC: 32.7 g/dL (ref 30.0–36.0)
MCV: 92.2 fL (ref 80.0–100.0)
Platelets: 304 10*3/uL (ref 150–400)
RBC: 3.48 MIL/uL — ABNORMAL LOW (ref 3.87–5.11)
RDW: 13.1 % (ref 11.5–15.5)
WBC: 7.6 10*3/uL (ref 4.0–10.5)
nRBC: 0 % (ref 0.0–0.2)

## 2021-09-24 LAB — COMPREHENSIVE METABOLIC PANEL
ALT: 25 U/L (ref 0–44)
AST: 17 U/L (ref 15–41)
Albumin: 4 g/dL (ref 3.5–5.0)
Alkaline Phosphatase: 79 U/L (ref 38–126)
Anion gap: 5 (ref 5–15)
BUN: 12 mg/dL (ref 6–20)
CO2: 24 mmol/L (ref 22–32)
Calcium: 8.5 mg/dL — ABNORMAL LOW (ref 8.9–10.3)
Chloride: 111 mmol/L (ref 98–111)
Creatinine, Ser: 0.55 mg/dL (ref 0.44–1.00)
GFR, Estimated: >60 mL/min (ref >60)
Glucose, Bld: 87 mg/dL (ref 70–99)
Potassium: 3.5 mmol/L (ref 3.5–5.1)
Sodium: 140 mmol/L (ref 135–145)
Total Bilirubin: 0.8 mg/dL (ref 0.3–1.2)
Total Protein: 6.7 g/dL (ref 6.5–8.1)

## 2021-09-24 LAB — POC URINE PREG, ED: Preg Test, Ur: NEGATIVE

## 2021-09-24 LAB — LIPASE, BLOOD: Lipase: 24 U/L (ref 11–51)

## 2021-09-24 MED ORDER — ONDANSETRON HCL 4 MG/2ML IJ SOLN
4.0000 mg | Freq: Once | INTRAMUSCULAR | Status: AC
  Administered 2021-09-24: 4 mg via INTRAVENOUS
  Filled 2021-09-24: qty 2

## 2021-09-24 MED ORDER — KETOROLAC TROMETHAMINE 30 MG/ML IJ SOLN
30.0000 mg | Freq: Once | INTRAMUSCULAR | Status: AC
  Administered 2021-09-24: 30 mg via INTRAVENOUS
  Filled 2021-09-24: qty 1

## 2021-09-24 MED ORDER — KETOROLAC TROMETHAMINE 30 MG/ML IJ SOLN
15.0000 mg | Freq: Once | INTRAMUSCULAR | Status: AC
  Administered 2021-09-24: 15 mg via INTRAVENOUS
  Filled 2021-09-24: qty 1

## 2021-09-24 MED ORDER — TAMSULOSIN HCL 0.4 MG PO CAPS: 0.4000 mg | ORAL_CAPSULE | Freq: Every day | ORAL | 0 refills | Status: AC

## 2021-09-24 MED ORDER — IOHEXOL 300 MG/ML  SOLN
100.0000 mL | Freq: Once | INTRAMUSCULAR | Status: AC | PRN
  Administered 2021-09-24: 100 mL via INTRAVENOUS

## 2021-09-24 MED ORDER — HYDROCODONE-ACETAMINOPHEN 5-325 MG PO TABS: 1.0000 | ORAL_TABLET | ORAL | 0 refills | Status: DC | PRN

## 2021-09-24 MED ORDER — SODIUM CHLORIDE 0.9 % IV BOLUS
1000.0000 mL | Freq: Once | INTRAVENOUS | Status: AC
  Administered 2021-09-24: 1000 mL via INTRAVENOUS

## 2021-09-24 NOTE — ED Provider Notes (Signed)
[facility] Provider Note   Event Date/Time   First MD Initiated Contact with Patient 09/24/21 1758     (approximate) History  Abdominal Pain and Flank Pain  HPI Maria Fuller is a 36 y.o. female with a stated past medical history of recurrent kidney stones and ovarian cysts status post right oophorectomy who presents for suprapubic abdominal pain that has been intermittent waxing and waning over the last 24 hours and associated with nausea/vomiting.  Patient states that when it is at its worst it is 10/10, nonradiating, sharp/cramping suprapubic pain that she states feels like when she has had a ruptured ovarian cysts in the past.  Patient states that his 5/10 at this time and endorses only mild nausea.  Patient currently denies any vision changes, tinnitus, difficulty speaking, facial droop, sore throat, chest pain, shortness of breath, vomiting/diarrhea, dysuria, or weakness/numbness/paresthesias in any extremity Physical Exam  Triage Vital Signs: ED Triage Vitals  Enc Vitals Group     BP 09/24/21 1626 (!) 153/82     Pulse Rate 09/24/21 1626 61     Resp 09/24/21 1626 14     Temp 09/24/21 1626 98.2 F (36.8 C)     Temp Source 09/24/21 1626 Oral     SpO2 09/24/21 1626 96 %     Weight 09/24/21 1627 245 lb (111.1 kg)     Height 09/24/21 1627 6\' 1"  (1.854 m)     Head Circumference --      Peak Flow --      Pain Score 09/24/21 1627 5     Pain Loc --      Pain Edu? --      Excl. in [place]? --    Most recent vital signs: Vitals:   09/24/21 1626  BP: (!) 153/82  Pulse: 61  Resp: 14  Temp: 98.2 F (36.8 C)  SpO2: 96%   General: Awake, oriented x4. CV:  Good peripheral perfusion.  Resp:  Normal effort.  Abd:  No distention.  Mild suprapubic tenderness to palpation Other:  Middle-aged Caucasian female sitting on stretcher in no acute distress ED Results / Procedures / Treatments  Labs (all labs ordered are listed, but only abnormal results are  displayed) Labs Reviewed  COMPREHENSIVE METABOLIC PANEL - Abnormal; Notable for the following components:      Result Value   Calcium 8.5 (*)    All other components within normal limits  CBC - Abnormal; Notable for the following components:   RBC 3.48 (*)    Hemoglobin 10.5 (*)    HCT 32.1 (*)    All other components within normal limits  URINALYSIS, ROUTINE W REFLEX MICROSCOPIC - Abnormal; Notable for the following components:   Color, Urine YELLOW (*)    APPearance HAZY (*)    All other components within normal limits  LIPASE, BLOOD  POC URINE PREG, ED  RADIOLOGY ED MD interpretation: CT of the abdomen and pelvis with IV contrast interpreted by me shows nonobstructing 1.7 cm stone in the lower pole of the left kidney without hydroureter or hydronephrosis.  There is also evidence of a small hyperdense lesion in the left lower quadrant consistent with possible kidney stone.  There is also redemonstration of the patient's left ovarian cyst -Agree with radiology assessment Official radiology report(s): CT Abdomen Pelvis W Contrast  Result Date: 09/24/2021 CLINICAL DATA:  Acute nonlocalized abdominal pain. Lower abdominal pain and bilateral flank pain since yesterday. Burning with urination. Passed a kidney stone this  morning. EXAM: CT ABDOMEN AND PELVIS WITH CONTRAST TECHNIQUE: Multidetector CT imaging of the abdomen and pelvis was performed using the standard protocol following bolus administration of intravenous contrast. RADIATION DOSE REDUCTION: This exam was performed according to the departmental dose-optimization program which includes automated exposure control, adjustment of the mA and/or kV according to patient size and/or use of iterative reconstruction technique. CONTRAST:  118mL OMNIPAQUE IOHEXOL 300 MG/ML  SOLN COMPARISON:  09/02/2021 FINDINGS: Lower chest: Lung bases are clear. Hepatobiliary: No focal liver abnormality is seen. Status post cholecystectomy. No biliary dilatation.  Pancreas: Unremarkable. No pancreatic ductal dilatation or surrounding inflammatory changes. Spleen: Normal in size without focal abnormality. Adrenals/Urinary Tract: No adrenal gland nodules. Large stone in the lower pole left kidney measuring 1.7 cm diameter, unchanged. No hydronephrosis or hydroureter. No ureteral stones are demonstrated. Bladder is unremarkable. Stomach/Bowel: Postoperative changes consistent with gastric sleeve. There appears to be anastomosis at the distal ileum. No small or large bowel distention. No wall thickening or inflammatory stranding. Appendix is normal. Vascular/Lymphatic: No significant vascular findings are present. No enlarged abdominal or pelvic lymph nodes. Reproductive: The intrauterine device is present centrally in the uterus. Uterus is anteverted. Uterus and ovaries are not enlarged. Other: No abdominal wall hernia or abnormality. No abdominopelvic ascites. Musculoskeletal: No acute or significant osseous findings. Spondylolysis with mild spondylolisthesis at L5-S1. IMPRESSION: 1. Nonobstructing 1.7 cm stone in the lower pole left kidney. No hydronephrosis or hydroureter. 2. No acute process demonstrated in the abdomen or pelvis. Electronically Signed   By: Lucienne Capers M.D.   On: 09/24/2021 18:55   PROCEDURES: Critical Care performed: No Procedures MEDICATIONS ORDERED IN ED: Medications  ketorolac (TORADOL) 30 MG/ML injection 15 mg (has no administration in time range)  sodium chloride 0.9 % bolus 1,000 mL (1,000 mLs Intravenous New Bag/Given 09/24/21 1839)  ketorolac (TORADOL) 30 MG/ML injection 30 mg (30 mg Intravenous Given 09/24/21 1835)  ondansetron (ZOFRAN) injection 4 mg (4 mg Intravenous Given 09/24/21 1836)  iohexol (OMNIPAQUE) 300 MG/ML solution 100 mL (100 mLs Intravenous Contrast Given 09/24/21 1842)   IMPRESSION / MDM / ASSESSMENT AND PLAN / ED COURSE  I reviewed the triage vital signs and the nursing notes.                               Patients symptoms not typical for emergent causes of abdominal pain such as, but not limited to, appendicitis, abdominal aortic aneurysm, surgical biliary disease, pancreatitis, SBO, mesenteric ischemia, serious intra-abdominal bacterial illness. Presentation also not typical of gynecologic emergencies such as TOA, Ovarian Torsion, PID. Not Ectopic. Doubt atypical ACS. -Possible torsion detorsion given large left ovarian cyst however patient's pain well controlled at this time.  There is also a small hyperdense lesion in the left lower quadrant area of the abdomen adjacent to the bladder concerning for possible passing kidney stone.  Less likely as patient does not have any urine in her UA however will treat with short course of analgesics Pt tolerating PO. Disposition: Patient will be discharged with strict return precautions and follow up with primary MD within 12-24 hours for further evaluation. Patient understands that this still may have an early presentation of an emergent medical condition such as appendicitis that will require a recheck.  Patient encouraged to follow-up with OB/GYN or urology as needed    FINAL CLINICAL IMPRESSION(S) / ED DIAGNOSES   Final diagnoses:  Acute suprapubic pain  Left ovarian cyst  Kidney stone   Rx / DC Orders   ED Discharge Orders          Ordered    HYDROcodone-acetaminophen (NORCO/VICODIN) 5-325 MG tablet  Every 4 hours PRN        09/24/21 1927    tamsulosin (FLOMAX) 0.4 MG CAPS capsule  Daily        09/24/21 1927           Note:  This document was prepared using Dragon voice recognition software and may include unintentional dictation errors.   Naaman Plummer, MD 09/24/21 [phone]

## 2021-09-24 NOTE — ED Triage Notes (Signed)
Pt to ED from home c/o lower abd pain, bilateral flank pain since yesterday, burning with urination.  States passed small kidney stone this morning.  N/v x2 today.  Was dx with left ovarian cysts a couple weeks ago and thought pain might be from that but is getting worse.  Pt A&Ox4, chest rise even and unlabored, skin WNL and in NAD at this time.

## 2021-10-17 ENCOUNTER — Ambulatory Visit: Payer: BC Managed Care – PPO | Admitting: Obstetrics and Gynecology

## 2021-10-17 ENCOUNTER — Ambulatory Visit: Payer: BC Managed Care – PPO

## 2021-10-17 DIAGNOSIS — N83202 Unspecified ovarian cyst, left side: Secondary | ICD-10-CM

## 2021-10-18 ENCOUNTER — Ambulatory Visit: Payer: BC Managed Care – PPO | Admitting: Obstetrics and Gynecology

## 2021-11-01 ENCOUNTER — Ambulatory Visit: Payer: BC Managed Care – PPO | Admitting: Family Medicine

## 2022-01-11 ENCOUNTER — Emergency Department
Admission: EM | Admit: 2022-01-11 | Discharge: 2022-01-11 | Disposition: A | Discharge status: Home / Self Care | Payer: BC Managed Care – PPO | Attending: Emergency Medicine | Admitting: Emergency Medicine

## 2022-01-11 ENCOUNTER — Emergency Department: Payer: BC Managed Care – PPO

## 2022-01-11 ENCOUNTER — Other Ambulatory Visit: Payer: Self-pay

## 2022-01-11 DIAGNOSIS — N390 Urinary tract infection, site not specified: Secondary | ICD-10-CM | POA: Diagnosis not present

## 2022-01-11 DIAGNOSIS — K529 Noninfective gastroenteritis and colitis, unspecified: Secondary | ICD-10-CM | POA: Diagnosis not present

## 2022-01-11 DIAGNOSIS — E119 Type 2 diabetes mellitus without complications: Secondary | ICD-10-CM | POA: Insufficient documentation

## 2022-01-11 DIAGNOSIS — R109 Unspecified abdominal pain: Secondary | ICD-10-CM | POA: Diagnosis present

## 2022-01-11 LAB — URINALYSIS, ROUTINE W REFLEX MICROSCOPIC
Bilirubin Urine: NEGATIVE
Glucose, UA: NEGATIVE mg/dL
Hgb urine dipstick: NEGATIVE
Ketones, ur: NEGATIVE mg/dL
Nitrite: NEGATIVE
Protein, ur: NEGATIVE mg/dL
Specific Gravity, Urine: 1.023 (ref 1.005–1.030)
WBC, UA: >50 WBC/hpf — ABNORMAL HIGH (ref 0–5)
pH: 6 (ref 5.0–8.0)

## 2022-01-11 LAB — CBC
HCT: 36.6 % (ref 36.0–46.0)
Hemoglobin: 11.8 g/dL — ABNORMAL LOW (ref 12.0–15.0)
MCH: 30.3 pg (ref 26.0–34.0)
MCHC: 32.2 g/dL (ref 30.0–36.0)
MCV: 93.8 fL (ref 80.0–100.0)
Platelets: 310 10*3/uL (ref 150–400)
RBC: 3.9 MIL/uL (ref 3.87–5.11)
RDW: 12.6 % (ref 11.5–15.5)
WBC: 6.5 10*3/uL (ref 4.0–10.5)
nRBC: 0 % (ref 0.0–0.2)

## 2022-01-11 LAB — COMPREHENSIVE METABOLIC PANEL
ALT: 24 U/L (ref 0–44)
AST: 21 U/L (ref 15–41)
Albumin: 4.2 g/dL (ref 3.5–5.0)
Alkaline Phosphatase: 86 U/L (ref 38–126)
Anion gap: 4 — ABNORMAL LOW (ref 5–15)
BUN: 12 mg/dL (ref 6–20)
CO2: 25 mmol/L (ref 22–32)
Calcium: 8.5 mg/dL — ABNORMAL LOW (ref 8.9–10.3)
Chloride: 112 mmol/L — ABNORMAL HIGH (ref 98–111)
Creatinine, Ser: 0.59 mg/dL (ref 0.44–1.00)
GFR, Estimated: >60 mL/min (ref >60)
Glucose, Bld: 86 mg/dL (ref 70–99)
Potassium: 3.5 mmol/L (ref 3.5–5.1)
Sodium: 141 mmol/L (ref 135–145)
Total Bilirubin: 0.9 mg/dL (ref 0.3–1.2)
Total Protein: 7.2 g/dL (ref 6.5–8.1)

## 2022-01-11 LAB — LIPASE, BLOOD: Lipase: 28 U/L (ref 11–51)

## 2022-01-11 LAB — POC URINE PREG, ED: Preg Test, Ur: NEGATIVE

## 2022-01-11 MED ORDER — MORPHINE SULFATE (PF) 4 MG/ML IV SOLN
4.0000 mg | Freq: Once | INTRAVENOUS | Status: AC
  Administered 2022-01-11: 4 mg via INTRAVENOUS
  Filled 2022-01-11: qty 1

## 2022-01-11 MED ORDER — DICYCLOMINE HCL 10 MG PO CAPS: 10.0000 mg | ORAL_CAPSULE | Freq: Three times a day (TID) | ORAL | 0 refills | Status: DC | PRN

## 2022-01-11 MED ORDER — IOHEXOL 300 MG/ML  SOLN
100.0000 mL | Freq: Once | INTRAMUSCULAR | Status: AC | PRN
  Administered 2022-01-11: 100 mL via INTRAVENOUS

## 2022-01-11 MED ORDER — SULFAMETHOXAZOLE-TRIMETHOPRIM 800-160 MG PO TABS: 1.0000 | ORAL_TABLET | Freq: Two times a day (BID) | ORAL | 0 refills | Status: AC

## 2022-01-11 MED ORDER — SODIUM CHLORIDE 0.9 % IV BOLUS
500.0000 mL | Freq: Once | INTRAVENOUS | Status: AC
  Administered 2022-01-11: 500 mL via INTRAVENOUS

## 2022-01-11 MED ORDER — ONDANSETRON 4 MG PO TBDP: 4.0000 mg | ORAL_TABLET | Freq: Three times a day (TID) | ORAL | 0 refills | Status: DC | PRN

## 2022-01-11 MED ORDER — TRAMADOL HCL 50 MG PO TABS: 50.0000 mg | ORAL_TABLET | Freq: Four times a day (QID) | ORAL | 0 refills | Status: AC | PRN

## 2022-01-11 MED ORDER — ONDANSETRON HCL 4 MG/2ML IJ SOLN
4.0000 mg | Freq: Once | INTRAMUSCULAR | Status: AC
  Administered 2022-01-11: 4 mg via INTRAVENOUS
  Filled 2022-01-11: qty 2

## 2022-01-11 NOTE — ED Notes (Signed)
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 Since her procedure, she has had 2 episodes of symptoms similar to presentation today which were found to be related to the development of internal hernias due to bariatric procedure. Today pt has had 3 episodes of emesis and 1 episode of watery incontinent diarrhea, treated with 4mg  zofran ODT at home with moderate temporary relief. RUQ Abd pain at rest, tender to R/LUQ on palpation. +BS in all quadrants. Decreased appetite and poor PO tolerance for the past several days with only some toast and a few bites of a sandwich today which were vomited up afterwards. Pt in NAD at time of assessment. Placed pt on monitor and will continue to assess as needed.

## 2022-01-11 NOTE — ED Provider Notes (Signed)
[facility] Provider Note    Event Date/Time   First MD Initiated Contact with Patient 01/11/22 1505     (approximate)   History   Abdominal Pain   HPI  Maria Fuller is a 36 y.o. female with a history of PCOS, diabetes, depression, salpingo-oophorectomy, incarcerated incisional hernia repair earlier this year, as well as prior duodenal switch bariatric surgery who presents with abdominal pain over the last week, primarily epigastric and periumbilical, associated with cramping and cold including of her intestines.  She has had nausea but no vomiting.  She reports a loose bowel movement early this morning and states she has not been passing gas.  She states the pain feels similar to previous internal hernias.    Physical Exam   Triage Vital Signs: ED Triage Vitals  Enc Vitals Group     BP 01/11/22 1427 134/71     Pulse Rate 01/11/22 1427 60     Resp 01/11/22 1427 16     Temp 01/11/22 1427 98.1 F (36.7 C)     Temp Source 01/11/22 1427 Oral     SpO2 01/11/22 1427 100 %     Weight 01/11/22 1428 240 lb (108.9 kg)     Height 01/11/22 1428 6\' 1"  (1.854 m)     Head Circumference --      Peak Flow --      Pain Score 01/11/22 1428 8     Pain Loc --      Pain Edu? --      Excl. in [place]? --     Most recent vital signs: Vitals:   01/11/22 1520 01/11/22 1600  BP: (!) 113/55 (!) 101/52  Pulse: (!) 59 (!) 58  Resp: 15 13  Temp:    SpO2: 100% 97%     General: Awake, no distress.  CV:  Good peripheral perfusion.  Resp:  Normal effort.  Abd:  Soft with mild bilateral upper abdominal tenderness.  No distention. Other:  Slightly dry mucous membranes.  No jaundice or scleral icterus.   ED Results / Procedures / Treatments   Labs (all labs ordered are listed, but only abnormal results are displayed) Labs Reviewed  COMPREHENSIVE METABOLIC PANEL - Abnormal; Notable for the following components:      Result Value   Chloride 112 (*)    Calcium 8.5  (*)    Anion gap 4 (*)    All other components within normal limits  CBC - Abnormal; Notable for the following components:   Hemoglobin 11.8 (*)    All other components within normal limits  URINALYSIS, ROUTINE W REFLEX MICROSCOPIC - Abnormal; Notable for the following components:   Color, Urine YELLOW (*)    APPearance CLOUDY (*)    Leukocytes,Ua MODERATE (*)    WBC, UA >50 (*)    Bacteria, UA RARE (*)    All other components within normal limits  LIPASE, BLOOD  POC URINE PREG, ED     EKG     RADIOLOGY  CT abdomen/pelvis: I independently viewed and interpreted the images; there is no free fluid, significantly dilated bowel loops or evidence of obstruction.  PROCEDURES:  Critical Care performed: No  Procedures   MEDICATIONS ORDERED IN ED: Medications  sodium chloride 0.9 % bolus 500 mL (0 mLs Intravenous Stopped 01/11/22 1707)  morphine (PF) 4 MG/ML injection 4 mg (4 mg Intravenous Given 01/11/22 1545)  ondansetron (ZOFRAN) injection 4 mg (4 mg Intravenous Given 01/11/22 1545)  iohexol (OMNIPAQUE)  300 MG/ML solution 100 mL (100 mLs Intravenous Contrast Given 01/11/22 1623)     IMPRESSION / MDM / ASSESSMENT AND PLAN / ED COURSE  I reviewed the triage vital signs and the nursing notes.  36 year old female with PMH as noted above presents with abdominal pain, nausea, and a loose bowel movement earlier today.  I reviewed the past medical records.  The patient was most recently admitted in March.  Per the discharge summary from the surgeon Dr. Claudine Mouton, she had incarcerated incisional hernia and small bowel obstruction status post a salpingo-oophorectomy several days previously.  On exam the patient has mild abdominal discomfort to palpation but no focal tenderness.  Differential diagnosis includes, but is not limited to, gastroenteritis, colitis, diverticulitis, gastritis, pancreatitis or other hepatobiliary cause, SBO, incarcerated hernia, volvulus.  Patient's presentation  is most consistent with acute presentation with potential threat to life or bodily function.  Initial lab work-up is unremarkable.  There is no leukocytosis.  Creatinine is normal.  Lipase is normal.  I have ordered a CT for further evaluation as well as analgesia and fluids.  ----------------------------------------- 6:15 PM on 01/11/2022 -----------------------------------------  CT shows some fluid-filled bowel loops consistent with enteritis.  There are no other acute abnormalities.  On reassessment the patient appears much more comfortable.  Urinalysis also shows findings of possible UTI.  The patient states that she has had an on and off UTI over the last month and recently completed a course of Cipro.  I do not see any prior urine cultures available for review.  She has not having any symptoms at this time.  I will treat with a course of Bactrim.  At this time the patient is stable for discharge home.  I counseled her on the results of the work-up.  Return precautions given, and the patient expresses understanding.  FINAL CLINICAL IMPRESSION(S) / ED DIAGNOSES   Final diagnoses:  Enteritis  Urinary tract infection without hematuria, site unspecified     Rx / DC Orders   ED Discharge Orders          Ordered    ondansetron (ZOFRAN-ODT) 4 MG disintegrating tablet  Every 8 hours PRN        01/11/22 1728    dicyclomine (BENTYL) 10 MG capsule  Every 8 hours PRN        01/11/22 1728    traMADol (ULTRAM) 50 MG tablet  Every 6 hours PRN        01/11/22 1728    sulfamethoxazole-trimethoprim (BACTRIM DS) 800-160 MG tablet  2 times daily        01/11/22 1813             Note:  This document was prepared using Dragon voice recognition software and may include unintentional dictation errors.    Dionne Bucy, MD 01/11/22 1816

## 2022-01-11 NOTE — Discharge Instructions (Addendum)
Your CT scan is showing enteritis or inflammation in your small intestine.  This is likely from a virus.  Take the Zofran as needed for nausea, the Bentyl for cramping and gurgling, and the tramadol only if needed for pain.  Return to the ER for new, worsening, or persistent severe abdominal pain, vomiting, fever, blood in the stool, or any other new or worsening symptoms that concern you.

## 2022-01-11 NOTE — ED Triage Notes (Signed)
Pt presents to ED with c/o of ABD for 1 week but states today she is also vomiting.   Pt states HX of weight loss surgery and HX of "internal hernias".

## 2022-01-20 ENCOUNTER — Emergency Department
Admission: EM | Admit: 2022-01-20 | Discharge: 2022-01-21 | Disposition: A | Discharge status: Home / Self Care | Payer: BC Managed Care – PPO | Attending: Emergency Medicine | Admitting: Emergency Medicine

## 2022-01-20 ENCOUNTER — Other Ambulatory Visit: Payer: Self-pay

## 2022-01-20 ENCOUNTER — Emergency Department: Payer: BC Managed Care – PPO

## 2022-01-20 ENCOUNTER — Encounter: Payer: Self-pay | Admitting: *Deleted

## 2022-01-20 DIAGNOSIS — E119 Type 2 diabetes mellitus without complications: Secondary | ICD-10-CM | POA: Insufficient documentation

## 2022-01-20 DIAGNOSIS — N390 Urinary tract infection, site not specified: Secondary | ICD-10-CM | POA: Diagnosis not present

## 2022-01-20 DIAGNOSIS — R109 Unspecified abdominal pain: Secondary | ICD-10-CM | POA: Diagnosis present

## 2022-01-20 DIAGNOSIS — I10 Essential (primary) hypertension: Secondary | ICD-10-CM | POA: Diagnosis not present

## 2022-01-20 LAB — URINALYSIS, ROUTINE W REFLEX MICROSCOPIC
Bilirubin Urine: NEGATIVE
Glucose, UA: NEGATIVE mg/dL
Ketones, ur: NEGATIVE mg/dL
Nitrite: NEGATIVE
Protein, ur: 30 mg/dL — AB
RBC / HPF: >50 RBC/hpf — ABNORMAL HIGH (ref 0–5)
Specific Gravity, Urine: 1.023 (ref 1.005–1.030)
WBC, UA: >50 WBC/hpf — ABNORMAL HIGH (ref 0–5)
pH: 5 (ref 5.0–8.0)

## 2022-01-20 LAB — CBC
HCT: 34.6 % — ABNORMAL LOW (ref 36.0–46.0)
Hemoglobin: 11.4 g/dL — ABNORMAL LOW (ref 12.0–15.0)
MCH: 30.3 pg (ref 26.0–34.0)
MCHC: 32.9 g/dL (ref 30.0–36.0)
MCV: 92 fL (ref 80.0–100.0)
Platelets: 295 10*3/uL (ref 150–400)
RBC: 3.76 MIL/uL — ABNORMAL LOW (ref 3.87–5.11)
RDW: 12.4 % (ref 11.5–15.5)
WBC: 7.7 10*3/uL (ref 4.0–10.5)
nRBC: 0 % (ref 0.0–0.2)

## 2022-01-20 LAB — COMPREHENSIVE METABOLIC PANEL
ALT: 23 U/L (ref 0–44)
AST: 19 U/L (ref 15–41)
Albumin: 4.2 g/dL (ref 3.5–5.0)
Alkaline Phosphatase: 85 U/L (ref 38–126)
Anion gap: 10 (ref 5–15)
BUN: 11 mg/dL (ref 6–20)
CO2: 21 mmol/L — ABNORMAL LOW (ref 22–32)
Calcium: 8.6 mg/dL — ABNORMAL LOW (ref 8.9–10.3)
Chloride: 110 mmol/L (ref 98–111)
Creatinine, Ser: 0.6 mg/dL (ref 0.44–1.00)
GFR, Estimated: >60 mL/min (ref >60)
Glucose, Bld: 78 mg/dL (ref 70–99)
Potassium: 3.6 mmol/L (ref 3.5–5.1)
Sodium: 141 mmol/L (ref 135–145)
Total Bilirubin: 0.8 mg/dL (ref 0.3–1.2)
Total Protein: 7.3 g/dL (ref 6.5–8.1)

## 2022-01-20 LAB — LIPASE, BLOOD: Lipase: 26 U/L (ref 11–51)

## 2022-01-20 LAB — POC URINE PREG, ED: Preg Test, Ur: NEGATIVE

## 2022-01-20 MED ORDER — ONDANSETRON HCL 4 MG/2ML IJ SOLN
4.0000 mg | Freq: Once | INTRAMUSCULAR | Status: AC
  Administered 2022-01-20: 4 mg via INTRAVENOUS
  Filled 2022-01-20: qty 2

## 2022-01-20 MED ORDER — IOHEXOL 300 MG/ML  SOLN
100.0000 mL | Freq: Once | INTRAMUSCULAR | Status: AC | PRN
  Administered 2022-01-20: 100 mL via INTRAVENOUS

## 2022-01-20 MED ORDER — MORPHINE SULFATE (PF) 4 MG/ML IV SOLN
4.0000 mg | Freq: Once | INTRAVENOUS | Status: AC
  Administered 2022-01-20: 4 mg via INTRAVENOUS
  Filled 2022-01-20: qty 1

## 2022-01-20 MED ORDER — SODIUM CHLORIDE 0.9 % IV BOLUS (SEPSIS)
1000.0000 mL | Freq: Once | INTRAVENOUS | Status: AC
  Administered 2022-01-20: 1000 mL via INTRAVENOUS

## 2022-01-20 NOTE — ED Notes (Signed)
Patient transported to CT 

## 2022-01-20 NOTE — ED Notes (Signed)
Pt brought to ED rm 10 at this time, this RN now assuming care. 

## 2022-01-20 NOTE — ED Triage Notes (Signed)
Pt reports abd and vomiting x 3 today. No diarrhea.  Pt seen here recently for similar sx.  Pt alert.   Speech clear.

## 2022-01-20 NOTE — ED Provider Notes (Signed)
[facility] Provider Note    Event Date/Time   First MD Initiated Contact with Patient 01/20/22 2300     (approximate)   History   Abdominal Pain   HPI  Maria Fuller is a 36 y.o. 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  Had similar symptoms on September 9 and was diagnosed with iritis.  Reports she has had internal hernias before and obstructions.  Also has history of kidney stones.  States she has had nausea and vomiting.  She is status post cholecystectomy and right oophorectomy but still has her appendix.  Has also had 2 C-sections.  No diarrhea.  No dysuria, hematuria.  She is not on her menstrual cycle.  No vaginal bleeding or discharge.  No diarrhea.  States she just finished Bactrim for a UTI on Thursday.   History provided by patient and husband.    Past Medical History:  Diagnosis Date   Anemia    slight   Bradycardia    Depression    Diabetes mellitus without complication (HCC)    resolved with weight loss from gastric bypass   Headache    migraines. resolved since gastric bypass   High cholesterol    History of kidney stones    last week   Hypertension    resolved since gastric bypass, severe pre-eclampsia with G1   Low BP    Meningitis 2015   PCOS (polycystic ovarian syndrome)    Preterm labor    Sleep apnea    had gastric bypass surgery and now resolved   Syncope    Vertigo couple years ago    Past Surgical History:  Procedure Laterality Date   CESAREAN SECTION     CESAREAN SECTION N/A 05/07/2016   Procedure: CESAREAN SECTION;  Surgeon: Nadara Mustard, MD;  Location: [facility] OR[facility];  Service: Obstetrics;  Laterality: N/A;  Time of birth: [birth year] Sex: Female Weight: 3940 kg, 8 lb 11 oz   CHOLECYSTECTOMY     CYSTOSCOPY WITH STENT PLACEMENT Right 09/19/2016   Procedure: CYSTOSCOPY WITH STENT PLACEMENT;  Surgeon:  Hildred Laser, MD;  Location: [facility];  Service: Urology;  Laterality: Right;   FLEXIBLE SIGMOIDOSCOPY N/A 07/28/2019   Procedure: FLEXIBLE SIGMOIDOSCOPY;  Surgeon: Toney Reil, MD;  Location: [facility];  Service: Endoscopy;  Laterality: N/A;   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   LAPAROSCOPIC OVARIAN CYSTECTOMY Right 09/28/2018   Procedure: LAPAROSCOPIC RIGHT OVARIAN CYSTECTOMY;  Surgeon: Nadara Mustard, MD;  Location: [facility];  Service: Gynecology;  Laterality: Right;   LAPAROSCOPIC UNILATERAL SALPINGO OOPHERECTOMY Right 07/25/2021   Procedure: LAPAROSCOPIC UNILATERAL SALPINGO OOPHORECTOMY;  Surgeon: Nadara Mustard, MD;  Location: [facility];  Service: Gynecology;  Laterality: Right;   LEFT HEART CATH AND CORONARY ANGIOGRAPHY Left 04/07/2018   Procedure: LEFT HEART CATH AND CORONARY ANGIOGRAPHY;  Surgeon: Alwyn Pea, MD;  Location: [facility];  Service: Cardiovascular;  Laterality: Left;   small bowel obstruction  07/2019   TONSILLECTOMY     URETEROSCOPY WITH HOLMIUM LASER LITHOTRIPSY Right 09/19/2016   Procedure: URETEROSCOPY WITH HOLMIUM LASER LITHOTRIPSY;  Surgeon: Hildred Laser, MD;  Location: [facility];  Service: Urology;  Laterality: Right;   XI ROBOTIC ASSISTED VENTRAL HERNIA N/A 07/27/2021   Procedure: XI ROBOTIC ASSISTED INCARCERATED INCISIONAL HERNIA REPAIR;  Surgeon: Campbell Lerner, MD;  Location: [facility];  Service: General;  Laterality: N/A;    MEDICATIONS:  Prior to Admission medications   Medication Sig Start Date End Date Taking? Authorizing Provider  buPROPion (WELLBUTRIN XL) 150 MG 24 hr tablet Take 150 mg by mouth daily. 06/17/21   [provider]  cyclobenzaprine (FLEXERIL) 5 MG tablet Take 1 tablet (5 mg total) by mouth 3 (three) times daily as needed for muscle spasms. 08/22/21   Donovan Kail, PA-C  dicyclomine (BENTYL) 10 MG capsule Take 1 capsule (10 mg total) by mouth every 8 (eight) hours as needed  (abdominal cramping). 01/11/22   Dionne Bucy, MD  HYDROcodone-acetaminophen (NORCO/VICODIN) 5-325 MG tablet Take 1 tablet by mouth every 4 (four) hours as needed for moderate pain. 09/24/21   Merwyn Katos, MD  levonorgestrel (MIRENA) 20 MCG/24HR IUD 1 each by Intrauterine route once.    [provider]  ondansetron (ZOFRAN-ODT) 4 MG disintegrating tablet Take 1 tablet (4 mg total) by mouth every 8 (eight) hours as needed for nausea or vomiting. 01/11/22   Dionne Bucy, MD  sertraline (ZOLOFT) 100 MG tablet Take 200 mg by mouth at bedtime.     [provider]  traZODone (DESYREL) 50 MG tablet Take 50 mg by mouth at bedtime.  09/06/18 10/18/19  [provider]    Physical Exam   Triage Vital Signs: ED Triage Vitals  Enc Vitals Group     BP 01/20/22 2016 (!) 142/82     Pulse Rate 01/20/22 2016 84     Resp 01/20/22 2016 18     Temp 01/20/22 2016 98.7 F (37.1 C)     Temp Source 01/20/22 2016 Oral     SpO2 01/20/22 2016 98 %     Weight 01/20/22 2013 247 lb (112 kg)     Height 01/20/22 2013  (1.854 m)     Head Circumference --      Peak Flow --      Pain Score 01/20/22 2013 7     Pain Loc --      Pain Edu? --      Excl. in GC? --     Most recent vital signs: Vitals:   01/21/22 0101 01/21/22 0130  BP:  119/62  Pulse:  (!) 59  Resp: 18 20  Temp:    SpO2:  99%    CONSTITUTIONAL: Alert and oriented and responds appropriately to questions.  Appears uncomfortable but not in distress HEAD: Normocephalic, atraumatic EYES: Conjunctivae clear, pupils appear equal, sclera nonicteric ENT: normal nose; moist mucous membranes NECK: Supple, normal ROM CARD: RRR; S1 and S2 appreciated; no murmurs, no clicks, no rubs, no gallops RESP: Normal chest excursion without splinting or tachypnea; breath sounds clear and equal bilaterally; no wheezes, no rhonchi, no rales, no hypoxia or respiratory distress, speaking full sentences ABD/GI: Normal bowel sounds;  non-distended; soft, tender to palpation throughout the abdomen without guarding or rebound BACK: The back appears normal EXT: Normal ROM in all joints; no deformity noted, no edema; no cyanosis SKIN: Normal color for age and race; warm; no rash on exposed skin NEURO: Moves all extremities equally, normal speech PSYCH: The patient's mood and manner are appropriate.   ED Results / Procedures / Treatments   LABS: (all labs ordered are listed, but only abnormal results are displayed) Labs Reviewed  COMPREHENSIVE METABOLIC PANEL - Abnormal; Notable for the following components:      Result Value   CO2 21 (*)    Calcium 8.6 (*)  All other components within normal limits  CBC - Abnormal; Notable for the following components:   RBC 3.76 (*)    Hemoglobin 11.4 (*)    HCT 34.6 (*)    All other components within normal limits  URINALYSIS, ROUTINE W REFLEX MICROSCOPIC - Abnormal; Notable for the following components:   Color, Urine YELLOW (*)    APPearance CLOUDY (*)    Hgb urine dipstick MODERATE (*)    Protein, ur 30 (*)    Leukocytes,Ua LARGE (*)    RBC / HPF >50 (*)    WBC, UA >50 (*)    Bacteria, UA RARE (*)    All other components within normal limits  URINE CULTURE  LIPASE, BLOOD  POC URINE PREG, ED     EKG:   RADIOLOGY: My personal review and interpretation of imaging: CT scan shows no acute abnormality.  I have personally reviewed all radiology reports.   CT ABDOMEN PELVIS W CONTRAST  Result Date: 01/20/2022 CLINICAL DATA:  Abdominal pain, acute, nonlocalized Flank pain, kidney stone suspected R sided abd pain, still has appendix, GB and R ovary gone, h/o duodenal switch and subsequent obstruction from internal hernia, also urine appears infected and has h/o kidney stones EXAM: CT ABDOMEN AND PELVIS WITH CONTRAST TECHNIQUE: Multidetector CT imaging of the abdomen and pelvis was performed using the standard protocol following bolus administration of intravenous  contrast. RADIATION DOSE REDUCTION: This exam was performed according to the departmental dose-optimization program which includes automated exposure control, adjustment of the mA and/or kV according to patient size and/or use of iterative reconstruction technique. CONTRAST:  OMNIPAQUE IOHEXOL 300 MG/ML  SOLN COMPARISON:  None Available. FINDINGS: Lower chest: Bilateral lower lobe subsegmental atelectasis. Hepatobiliary: The liver is enlarged measuring up to 19.5 cm. No focal liver abnormality. Status post cholecystectomy. No biliary dilatation. Pancreas: No focal lesion. Normal pancreatic contour. No surrounding inflammatory changes. No main pancreatic ductal dilatation. Spleen: Normal in size without focal abnormality. Adrenals/Urinary Tract: No adrenal nodule bilaterally. Bilateral kidneys enhance symmetrically. 1.3 cm left fluid density lesion likely represents a simple renal cyst. Simple renal cysts, in the absence of clinically indicated signs/symptoms, require no independent follow-up. No hydronephrosis. No hydroureter. 2.1 cm left nephrolithiasis. No right nephrolithiasis. No ureterolithiasis bilaterally The urinary bladder is unremarkable. Stomach/Bowel: Partial colectomy. Duodenal switch gastric bypass surgical changes noted. Stomach is within normal limits. No evidence of bowel wall thickening or dilatation. The appendix is not definitely identified with no inflammatory changes in the right lower quadrant to suggest acute appendicitis. Vascular/Lymphatic: No abdominal aorta or iliac aneurysm. Mild atherosclerotic plaque of the aorta and its branches. No abdominal, pelvic, or inguinal lymphadenopathy. Reproductive: T-shaped intrauterine vice noted within the endometrium. The uterus and bilateral adnexa are unremarkable. Other: No intraperitoneal free fluid. No intraperitoneal free gas. No organized fluid collection. Musculoskeletal: No abdominal wall hernia or abnormality. No suspicious lytic or  blastic osseous lesions. No acute displaced fracture. 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 Mild retrolisthesis of L4 on L5. IMPRESSION: 1. Nonobstructive left nephrolithiasis measuring up to 2.1 cm. 2. 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 3. Hepatomegaly. Electronically Signed   By: Tish Frederickson M.D.   On: 01/20/2022 23:57     PROCEDURES:  Critical Care performed: No     Procedures    IMPRESSION / MDM / ASSESSMENT AND PLAN / ED COURSE  I reviewed the triage vital signs  and the nursing notes.    Patient here with complaints of right-sided abdominal pain, vomiting.   DIFFERENTIAL DIAGNOSIS (includes but not limited to):   Appendicitis, UTI, kidney stone, pyelonephritis, bowel obstruction, gastroenteritis   Patient's presentation is most consistent with acute presentation with potential threat to life or bodily function.   PLAN: CBC, CMP, lipase, urinalysis, pregnancy test obtained from triage.  No leukocytosis.  Normal LFTs and lipase.  Urine does appear infected.  We will send urine culture and give Rocephin.  Previous urine culture in July grew Proteus mirabilis sensitive to cephalosporins.  She is not pregnant.  Will obtain CT of the abdomen pelvis.  Will give IV fluids, pain and nausea medicine.   MEDICATIONS GIVEN IN ED: Medications  sodium chloride 0.9 % bolus 1,000 mL (0 mLs Intravenous Stopped 01/20/22 2344)  morphine (PF) 4 MG/ML injection 4 mg (4 mg Intravenous Given 01/20/22 2314)  ondansetron (ZOFRAN) injection 4 mg (4 mg Intravenous Given 01/20/22 2314)  iohexol (OMNIPAQUE) 300 MG/ML solution 100 mL (100 mLs Intravenous Contrast Given 01/20/22 2340)  ketorolac (TORADOL) 30 MG/ML injection 30 mg (30 mg Intravenous Given 01/21/22 0100)  ondansetron (ZOFRAN) injection 4 mg (4 mg Intravenous Given 01/21/22 0100)     ED COURSE: CT scan reviewed and  interpreted by myself and the radiologist and shows no acute abnormality.  Appendix is not definitively visualized but there are no inflammatory changes in the right lower quadrant and she has no significant tenderness at McBurney's point.  No ureterolithiasis, hydronephrosis or signs of pyelonephritis.  No internal hernias or bowel obstruction appreciated.  Attempted to p.o. challenge patient but she started having worsening pain and nausea.  Will give Toradol, another round of Zofran and reassess.    Patient feeling better after second round of medications and tolerating p.o.  Will discharge home with short course of analgesia, nausea medicine and cefdinir.  She has an allergy listed to cefuroxime but has tolerated Rocephin here and cefazolin before.  Will give urology follow-up given history of recurrent UTIs.  Recommended follow-up with her primary care doctor as well.   At this time, I do not feel there is any life-threatening condition present. I reviewed all nursing notes, vitals, pertinent previous records.  All lab and urine results, EKGs, imaging ordered have been independently reviewed and interpreted by myself.  I reviewed all available radiology reports from any imaging ordered this visit.  Based on my assessment, I feel the patient is safe to be discharged home without further emergent workup and can continue workup as an outpatient as needed. Discussed all findings, treatment plan as well as usual and customary return precautions.  They verbalize understanding and are comfortable with this plan.  Outpatient follow-up has been provided as needed.  All questions have been answered.    CONSULTS:  none   OUTSIDE RECORDS REVIEWED: Reviewed patient's last family medicine visit on 12/19/2021.       FINAL CLINICAL IMPRESSION(S) / ED DIAGNOSES   Final diagnoses:  Right sided abdominal pain  Acute UTI     Rx / DC Orders   ED Discharge Orders          Ordered     HYDROcodone-acetaminophen (NORCO/VICODIN) 5-325 MG tablet  Every 4 hours PRN        01/21/22 0148    ondansetron (ZOFRAN-ODT) 4 MG disintegrating tablet  Every 6 hours PRN        01/21/22 0148    cefdinir (OMNICEF) 300 MG capsule  2 times daily        01/21/22 0148             Note:  This document was prepared using Dragon voice recognition software and may include unintentional dictation errors.   Kaydance Bowie, Delice Bison, DO 01/21/22 [phone]

## 2022-01-20 NOTE — ED Notes (Signed)
ED Provider at bedside. 

## 2022-01-21 MED ORDER — CEFDINIR 300 MG PO CAPS: 300.0000 mg | ORAL_CAPSULE | Freq: Two times a day (BID) | ORAL | 0 refills | Status: DC

## 2022-01-21 MED ORDER — ONDANSETRON HCL 4 MG/2ML IJ SOLN
4.0000 mg | Freq: Once | INTRAMUSCULAR | Status: AC
  Administered 2022-01-21: 4 mg via INTRAVENOUS
  Filled 2022-01-21: qty 2

## 2022-01-21 MED ORDER — HYDROCODONE-ACETAMINOPHEN 5-325 MG PO TABS: 1.0000 | ORAL_TABLET | ORAL | 0 refills | Status: DC | PRN

## 2022-01-21 MED ORDER — KETOROLAC TROMETHAMINE 30 MG/ML IJ SOLN
30.0000 mg | Freq: Once | INTRAMUSCULAR | Status: AC
  Administered 2022-01-21: 30 mg via INTRAVENOUS
  Filled 2022-01-21: qty 1

## 2022-01-21 MED ORDER — ONDANSETRON 4 MG PO TBDP: 4.0000 mg | ORAL_TABLET | Freq: Four times a day (QID) | ORAL | 0 refills | Status: DC | PRN

## 2022-01-21 NOTE — Discharge Instructions (Addendum)
You are being provided a prescription for opiates (also known as narcotics) for pain control.  Opiates can be addictive and should only be used when absolutely necessary for pain control when other alternatives do not work.  We recommend you only use them for the recommended amount of time and only as prescribed.  Please do not take with other sedative medications or alcohol.  Please do not drive, operate machinery, make important decisions while taking opiates.  Please note that these medications can be addictive and have high abuse potential.  Patients can become addicted to narcotics after only taking them for a few days.  Please keep these medications locked away from children, teenagers or any family members with history of substance abuse.  Narcotic pain medicine may also make you constipated.  You may use over-the-counter medications such as MiraLAX, Colace to prevent constipation.  If you become constipated, you may use over-the-counter enemas as needed.  Itching and nausea are also common side effects of narcotic pain medication.  If you develop uncontrolled vomiting or a rash, please stop these medications and seek medical care.  

## 2022-01-21 NOTE — ED Notes (Signed)
Signing pad did not work, pt verbalized understanding of DC instructions. 

## 2022-01-23 LAB — URINE CULTURE: Culture: 80000 — AB

## 2022-02-16 ENCOUNTER — Observation Stay: Payer: BC Managed Care – PPO

## 2022-02-16 ENCOUNTER — Emergency Department: Payer: BC Managed Care – PPO

## 2022-02-16 ENCOUNTER — Other Ambulatory Visit: Payer: Self-pay

## 2022-02-16 ENCOUNTER — Observation Stay
Admission: EM | Admit: 2022-02-16 | Discharge: 2022-02-18 | Disposition: A | Discharge status: Home / Self Care | Payer: BC Managed Care – PPO | Attending: Surgery | Admitting: Surgery

## 2022-02-16 DIAGNOSIS — K5669 Other partial intestinal obstruction: Secondary | ICD-10-CM | POA: Diagnosis present

## 2022-02-16 DIAGNOSIS — E7800 Pure hypercholesterolemia, unspecified: Secondary | ICD-10-CM | POA: Diagnosis present

## 2022-02-16 DIAGNOSIS — Z803 Family history of malignant neoplasm of breast: Secondary | ICD-10-CM

## 2022-02-16 DIAGNOSIS — Y848 Other medical procedures as the cause of abnormal reaction of the patient, or of later complication, without mention of misadventure at the time of the procedure: Secondary | ICD-10-CM | POA: Diagnosis present

## 2022-02-16 DIAGNOSIS — Z9049 Acquired absence of other specified parts of digestive tract: Secondary | ICD-10-CM

## 2022-02-16 DIAGNOSIS — Z888 Allergy status to other drugs, medicaments and biological substances status: Secondary | ICD-10-CM

## 2022-02-16 DIAGNOSIS — Z87442 Personal history of urinary calculi: Secondary | ICD-10-CM

## 2022-02-16 DIAGNOSIS — Z881 Allergy status to other antibiotic agents status: Secondary | ICD-10-CM

## 2022-02-16 DIAGNOSIS — Z833 Family history of diabetes mellitus: Secondary | ICD-10-CM

## 2022-02-16 DIAGNOSIS — K56609 Unspecified intestinal obstruction, unspecified as to partial versus complete obstruction: Secondary | ICD-10-CM

## 2022-02-16 DIAGNOSIS — K56600 Partial intestinal obstruction, unspecified as to cause: Secondary | ICD-10-CM | POA: Diagnosis not present

## 2022-02-16 DIAGNOSIS — R109 Unspecified abdominal pain: Secondary | ICD-10-CM | POA: Diagnosis present

## 2022-02-16 DIAGNOSIS — F32A Depression, unspecified: Secondary | ICD-10-CM | POA: Diagnosis present

## 2022-02-16 DIAGNOSIS — Z793 Long term (current) use of hormonal contraceptives: Secondary | ICD-10-CM

## 2022-02-16 DIAGNOSIS — K9589 Other complications of other bariatric procedure: Principal | ICD-10-CM | POA: Diagnosis present

## 2022-02-16 DIAGNOSIS — Z880 Allergy status to penicillin: Secondary | ICD-10-CM

## 2022-02-16 DIAGNOSIS — Z8661 Personal history of infections of the central nervous system: Secondary | ICD-10-CM

## 2022-02-16 DIAGNOSIS — Z79899 Other long term (current) drug therapy: Secondary | ICD-10-CM

## 2022-02-16 DIAGNOSIS — Z8052 Family history of malignant neoplasm of bladder: Secondary | ICD-10-CM

## 2022-02-16 DIAGNOSIS — Z818 Family history of other mental and behavioral disorders: Secondary | ICD-10-CM

## 2022-02-16 DIAGNOSIS — E119 Type 2 diabetes mellitus without complications: Secondary | ICD-10-CM | POA: Diagnosis present

## 2022-02-16 DIAGNOSIS — E282 Polycystic ovarian syndrome: Secondary | ICD-10-CM | POA: Diagnosis present

## 2022-02-16 DIAGNOSIS — I10 Essential (primary) hypertension: Secondary | ICD-10-CM | POA: Diagnosis present

## 2022-02-16 LAB — URINALYSIS, ROUTINE W REFLEX MICROSCOPIC
Bilirubin Urine: NEGATIVE
Glucose, UA: NEGATIVE mg/dL
Ketones, ur: NEGATIVE mg/dL
Nitrite: NEGATIVE
Protein, ur: NEGATIVE mg/dL
Specific Gravity, Urine: 1.01 (ref 1.005–1.030)
pH: 6 (ref 5.0–8.0)

## 2022-02-16 LAB — CBC
HCT: 37.5 % (ref 36.0–46.0)
Hemoglobin: 12.1 g/dL (ref 12.0–15.0)
MCH: 30.1 pg (ref 26.0–34.0)
MCHC: 32.3 g/dL (ref 30.0–36.0)
MCV: 93.3 fL (ref 80.0–100.0)
Platelets: 295 10*3/uL (ref 150–400)
RBC: 4.02 MIL/uL (ref 3.87–5.11)
RDW: 12.7 % (ref 11.5–15.5)
WBC: 7 10*3/uL (ref 4.0–10.5)
nRBC: 0 % (ref 0.0–0.2)

## 2022-02-16 LAB — COMPREHENSIVE METABOLIC PANEL
ALT: 21 U/L (ref 0–44)
AST: 16 U/L (ref 15–41)
Albumin: 4.3 g/dL (ref 3.5–5.0)
Alkaline Phosphatase: 85 U/L (ref 38–126)
Anion gap: 2 — ABNORMAL LOW (ref 5–15)
BUN: 12 mg/dL (ref 6–20)
CO2: 23 mmol/L (ref 22–32)
Calcium: 8.2 mg/dL — ABNORMAL LOW (ref 8.9–10.3)
Chloride: 113 mmol/L — ABNORMAL HIGH (ref 98–111)
Creatinine, Ser: 0.5 mg/dL (ref 0.44–1.00)
GFR, Estimated: >60 mL/min (ref >60)
Glucose, Bld: 92 mg/dL (ref 70–99)
Potassium: 3.6 mmol/L (ref 3.5–5.1)
Sodium: 138 mmol/L (ref 135–145)
Total Bilirubin: 1 mg/dL (ref 0.3–1.2)
Total Protein: 7.3 g/dL (ref 6.5–8.1)

## 2022-02-16 LAB — POC URINE PREG, ED: Preg Test, Ur: NEGATIVE

## 2022-02-16 LAB — LIPASE, BLOOD: Lipase: 25 U/L (ref 11–51)

## 2022-02-16 MED ORDER — MORPHINE SULFATE (PF) 2 MG/ML IV SOLN: 2.0000 mg | INTRAVENOUS | Status: DC | PRN

## 2022-02-16 MED ORDER — TRAZODONE HCL 50 MG PO TABS
50.0000 mg | ORAL_TABLET | Freq: Every day | ORAL | Status: DC
  Administered 2022-02-17: 50 mg via ORAL
  Filled 2022-02-16 (×2): qty 1

## 2022-02-16 MED ORDER — ONDANSETRON HCL 4 MG/2ML IJ SOLN
4.0000 mg | Freq: Four times a day (QID) | INTRAMUSCULAR | Status: DC | PRN
  Administered 2022-02-17 (×3): 4 mg via INTRAVENOUS
  Filled 2022-02-16 (×3): qty 2

## 2022-02-16 MED ORDER — DIATRIZOATE MEGLUMINE & SODIUM 66-10 % PO SOLN
90.0000 mL | Freq: Once | ORAL | Status: AC
  Administered 2022-02-16: 90 mL via ORAL

## 2022-02-16 MED ORDER — BUPROPION HCL ER (XL) 150 MG PO TB24
150.0000 mg | ORAL_TABLET | Freq: Every day | ORAL | Status: DC
  Administered 2022-02-17 – 2022-02-18 (×2): 150 mg via ORAL
  Filled 2022-02-16 (×2): qty 1

## 2022-02-16 MED ORDER — SODIUM CHLORIDE 0.9 % IV SOLN: INTRAVENOUS | Status: DC

## 2022-02-16 MED ORDER — HYDROCODONE-ACETAMINOPHEN 5-325 MG PO TABS
1.0000 | ORAL_TABLET | ORAL | Status: DC | PRN
  Administered 2022-02-17 (×3): 2 via ORAL
  Administered 2022-02-17: 1 via ORAL
  Filled 2022-02-16 (×2): qty 2
  Filled 2022-02-16: qty 1
  Filled 2022-02-16: qty 2

## 2022-02-16 MED ORDER — FENTANYL CITRATE PF 50 MCG/ML IJ SOSY
75.0000 ug | PREFILLED_SYRINGE | Freq: Once | INTRAMUSCULAR | Status: AC
  Administered 2022-02-16: 75 ug via INTRAVENOUS
  Filled 2022-02-16: qty 2

## 2022-02-16 MED ORDER — TRAMADOL HCL 50 MG PO TABS: 50.0000 mg | ORAL_TABLET | Freq: Four times a day (QID) | ORAL | Status: DC | PRN

## 2022-02-16 MED ORDER — ENOXAPARIN SODIUM 40 MG/0.4ML IJ SOSY
40.0000 mg | PREFILLED_SYRINGE | INTRAMUSCULAR | Status: DC
  Administered 2022-02-18: 40 mg via SUBCUTANEOUS
  Filled 2022-02-16: qty 0.4

## 2022-02-16 MED ORDER — SODIUM CHLORIDE 0.9 % IV BOLUS
1000.0000 mL | Freq: Once | INTRAVENOUS | Status: AC
  Administered 2022-02-16: 1000 mL via INTRAVENOUS

## 2022-02-16 MED ORDER — ONDANSETRON HCL 4 MG/2ML IJ SOLN
4.0000 mg | Freq: Once | INTRAMUSCULAR | Status: AC
  Administered 2022-02-16: 4 mg via INTRAVENOUS
  Filled 2022-02-16: qty 2

## 2022-02-16 MED ORDER — SERTRALINE HCL 50 MG PO TABS
200.0000 mg | ORAL_TABLET | Freq: Every day | ORAL | Status: DC
  Administered 2022-02-16 – 2022-02-17 (×2): 100 mg via ORAL
  Filled 2022-02-16 (×2): qty 4

## 2022-02-16 MED ORDER — FENTANYL CITRATE PF 50 MCG/ML IJ SOSY
50.0000 ug | PREFILLED_SYRINGE | Freq: Once | INTRAMUSCULAR | Status: AC
  Administered 2022-02-16: 50 ug via INTRAVENOUS
  Filled 2022-02-16: qty 1

## 2022-02-16 MED ORDER — DOCUSATE SODIUM 100 MG PO CAPS: 100.0000 mg | ORAL_CAPSULE | Freq: Two times a day (BID) | ORAL | Status: DC | PRN

## 2022-02-16 MED ORDER — ONDANSETRON 4 MG PO TBDP: 4.0000 mg | ORAL_TABLET | Freq: Four times a day (QID) | ORAL | Status: DC | PRN

## 2022-02-16 NOTE — ED Provider Triage Note (Signed)
  Emergency Medicine Provider Triage Evaluation Note  Maria Fuller , a 36 y.o.female,  was evaluated in triage.  Pt complains of abdominal pain started approximately 3 days ago.  She states that she has a burning sensation in her chest and epigastric region, which she attributes to acid reflux.  However, she also has some periumbilical pain as well and has been diagnosed with bowel obstructions in the past.  She is unsure if this is what is going on today.   Review of Systems  Positive: Abdominal pain, GERD,  Negative: Denies fever, chest pain, vomiting  Physical Exam   Vitals:   02/16/22 1319  BP: (!) 167/80  Pulse: (!) 58  Resp: 18  Temp: 98.4 F (36.9 C)  SpO2: 100%   Gen:   Awake, no distress   Resp:  Normal effort  MSK:   Moves extremities without difficulty  Other:    Medical Decision Making  Given the patient's initial medical screening exam, the following diagnostic evaluation has been ordered. The patient will be placed in the appropriate treatment space, once one is available, to complete the evaluation and treatment. I have discussed the plan of care with the patient and I have advised the patient that an ED physician or mid-level practitioner will reevaluate their condition after the test results have been received, as the results may give them additional insight into the type of treatment they may need.    Diagnostics: Labs, abdominal CT, UA.  Treatments: none immediately   Teodoro Spray, [place] 02/16/22 1352

## 2022-02-16 NOTE — ED Triage Notes (Signed)
Pt reports abdominal pain that started this past Friday with acid reflux. Pt states this is an ongoing issue from her weight loss surgery 7 years ago. Pt was also previously diagnosed with peptic ulcer and pt is on Protonix, but unable to keep it down. Pt states a month ago she was here for the same and diagnosed with a UTI

## 2022-02-16 NOTE — H&P (Signed)
Subjective:   CC: pSBO  HPI:  Maria Fuller is a 36 y.o. female who was consulted for issue above.  Symptoms were first noted a few days ago. Pain is sharp, intermittent, confined to the epigastric area, without radiation.  Associated with nausea, exacerbated by nothing specific.  Last BM and flatus yesterday, occurred after pain started.  Hx of similar episodes in past with no obvious cause.  Past history of bypass revision and hernia repairs     Past Medical History:  has a past medical history of Anemia, Bradycardia, Depression, Diabetes mellitus without complication ([place]), Headache, High cholesterol, History of kidney stones, Hypertension, Low BP, Meningitis (2015), PCOS (polycystic ovarian syndrome), Preterm labor, Sleep apnea, Syncope, and Vertigo (couple years ago).  Past Surgical History:  Past Surgical History:  Procedure Laterality Date   CESAREAN SECTION     CESAREAN SECTION N/A 05/07/2016   Procedure: CESAREAN SECTION;  Surgeon: Gae Dry, MD;  Location: [facility];  Service: Obstetrics;  Laterality: N/A;  Time of birth: [birth year] Sex: Female Weight: 3940 kg, 8 lb 11 oz   CHOLECYSTECTOMY     CYSTOSCOPY WITH STENT PLACEMENT Right 09/19/2016   Procedure: CYSTOSCOPY WITH STENT PLACEMENT;  Surgeon: Nickie Retort, MD;  Location: [facility];  Service: Urology;  Laterality: Right;   FLEXIBLE SIGMOIDOSCOPY N/A 07/28/2019   Procedure: FLEXIBLE SIGMOIDOSCOPY;  Surgeon: Lin Landsman, MD;  Location: [facility];  Service: Endoscopy;  Laterality: N/A;   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   LAPAROSCOPIC OVARIAN CYSTECTOMY Right 09/28/2018   Procedure: LAPAROSCOPIC RIGHT OVARIAN CYSTECTOMY;  Surgeon: Gae Dry, MD;  Location: [facility];  Service: Gynecology;  Laterality: Right;   LAPAROSCOPIC UNILATERAL SALPINGO OOPHERECTOMY Right 07/25/2021   Procedure: LAPAROSCOPIC UNILATERAL SALPINGO OOPHORECTOMY;  Surgeon: Gae Dry, MD;  Location: [facility];  Service:  Gynecology;  Laterality: Right;   LEFT HEART CATH AND CORONARY ANGIOGRAPHY Left 04/07/2018   Procedure: LEFT HEART CATH AND CORONARY ANGIOGRAPHY;  Surgeon: Yolonda Kida, MD;  Location: [facility];  Service: Cardiovascular;  Laterality: Left;   small bowel obstruction  07/2019   TONSILLECTOMY     URETEROSCOPY WITH HOLMIUM LASER LITHOTRIPSY Right 09/19/2016   Procedure: URETEROSCOPY WITH HOLMIUM LASER LITHOTRIPSY;  Surgeon: Nickie Retort, MD;  Location: [facility];  Service: Urology;  Laterality: Right;   XI ROBOTIC ASSISTED VENTRAL HERNIA N/A 07/27/2021   Procedure: XI ROBOTIC ASSISTED INCARCERATED INCISIONAL HERNIA REPAIR;  Surgeon: [name], MD;  Location: [facility];  Service: General;  Laterality: N/A;    Family History: family history includes Bladder Cancer in her father; Breast cancer in her paternal grandmother; Depression in her father; Diabetes in her father, paternal grandfather, and paternal grandmother; Endometriosis in her sister; Polycystic ovary syndrome in her mother and sister.  Social History:  reports that she has never smoked. She has never used smokeless tobacco. She reports that she does not drink alcohol and does not use drugs.  Current Medications:  Prior to Admission medications   Medication Sig Start Date End Date Taking? Authorizing Provider  buPROPion (WELLBUTRIN XL) 150 MG 24 hr tablet Take 150 mg by mouth daily. 06/17/21   [provider]  cefdinir (OMNICEF) 300 MG capsule Take 1 capsule (300 mg total) by mouth 2 (two) times daily. 01/21/22   Ward, Delice Bison, DO  HYDROcodone-acetaminophen (NORCO/VICODIN) 5-325 MG tablet Take 1 tablet by mouth every 4 (four) hours as needed. 01/21/22   Ward, Delice Bison,  DO  levonorgestrel (MIRENA) 20 MCG/24HR IUD 1 each by Intrauterine route once.    [provider]  ondansetron (ZOFRAN-ODT) 4 MG disintegrating tablet Take 1 tablet (4 mg total) by mouth every 6 (six) hours as needed for nausea or  vomiting. 01/21/22   Ward, Delice Bison, DO  sertraline (ZOLOFT) 100 MG tablet Take 200 mg by mouth at bedtime.     [provider]  traZODone (DESYREL) 50 MG tablet Take 50 mg by mouth at bedtime.  09/06/18 10/18/19  [provider]    Allergies:  Allergies as of 02/16/2022 - Review Complete 01/20/2022  Allergen Reaction Noted   Compazine [prochlorperazine] Other (See Comments) 02/16/2022   Ceftin [cefuroxime axetil] Rash 03/03/2018   Penicillins Rash 06/26/2014    ROS:  General: Denies weight loss, weight gain, fatigue, fevers, chills, and night sweats. Eyes: Denies blurry vision, double vision, eye pain, itchy eyes, and tearing. Ears: Denies hearing loss, earache, and ringing in ears. Nose: Denies sinus pain, congestion, infections, runny nose, and nosebleeds. Mouth/throat: Denies hoarseness, sore throat, bleeding gums, and difficulty swallowing. Heart: Denies chest pain, palpitations, racing heart, irregular heartbeat, leg pain or swelling, and decreased activity tolerance. Respiratory: Denies breathing difficulty, shortness of breath, wheezing, cough, and sputum. GI: Denies change in appetite, heartburn, nausea, vomiting, constipation, diarrhea, and blood in stool. GU: Denies difficulty urinating, pain with urinating, urgency, frequency, blood in urine. Musculoskeletal: Denies joint stiffness, pain, swelling, muscle weakness. Skin: Denies rash, itching, mass, tumors, sores, and boils Neurologic: Denies headache, fainting, dizziness, seizures, numbness, and tingling. Psychiatric: Denies depression, anxiety, difficulty sleeping, and memory loss. Endocrine: Denies heat or cold intolerance, and increased thirst or urination. Blood/lymph: Denies easy bruising, easy bruising, and swollen glands     Objective:     BP 129/73 (BP Location: Left Arm)   Pulse (!) 54   Temp 98.2 F (36.8 C) (Oral)   Resp 20   Ht 6\' 1"  (1.854 m)   Wt 113.4 kg   LMP  (Exact Date)   SpO2  99%   BMI 32.98 kg/m   Constitutional :  alert, cooperative, appears stated age, and no distress  Lymphatics/Throat:  no asymmetry, masses, or scars  Respiratory:  clear to auscultation bilaterally  Cardiovascular:  regular rate and rhythm  Gastrointestinal: Soft, non-distended, minimal TTP epigastric region .   Musculoskeletal: Steady movement  Skin: Cool and moist   Psychiatric: Normal affect, non-agitated, not confused       LABS:     Latest Ref Rng & Units 02/16/2022    1:31 PM 01/20/2022    8:15 PM 01/11/2022    2:30 PM  CMP  Glucose 70 - 99 mg/dL 92  78  86   BUN 6 - 20 mg/dL 12  11  12    Creatinine 0.44 - 1.00 mg/dL 0.50  0.60  0.59   Sodium 135 - 145 mmol/L 138  141  141   Potassium 3.5 - 5.1 mmol/L 3.6  3.6  3.5   Chloride 98 - 111 mmol/L 113  110  112   CO2 22 - 32 mmol/L 23  21  25    Calcium 8.9 - 10.3 mg/dL 8.2  8.6  8.5   Total Protein 6.5 - 8.1 g/dL 7.3  7.3  7.2   Total Bilirubin 0.3 - 1.2 mg/dL 1.0  0.8  0.9   Alkaline Phos 38 - 126 U/L 85  85  86   AST 15 - 41 U/L 16  19  21  ALT 0 - 44 U/L 21  23  24        Latest Ref Rng & Units 02/16/2022    1:31 PM 01/20/2022    8:15 PM 01/11/2022    2:30 PM  CBC  WBC 4.0 - 10.5 K/uL 7.0  7.7  6.5   Hemoglobin 12.0 - 15.0 g/dL 12.1  11.4  11.8   Hematocrit 36.0 - 46.0 % 37.5  34.6  36.6   Platelets 150 - 400 K/uL 295  295  310     RADS: CLINICAL DATA:  35 year old female with acute abdominal and pelvic pain.   EXAM: CT ABDOMEN AND PELVIS WITHOUT CONTRAST   TECHNIQUE: Multidetector CT imaging of the abdomen and pelvis was performed following the standard protocol without IV contrast.   RADIATION DOSE REDUCTION: This exam was performed according to the departmental dose-optimization program which includes automated exposure control, adjustment of the mA and/or kV according to patient size and/or use of iterative reconstruction technique.   COMPARISON:  01/20/2022 and prior CTs   FINDINGS: Please note that  parenchymal and vascular abnormalities may be missed as intravenous contrast was not administered.   Lower chest: No acute abnormality.   Hepatobiliary: The liver is unremarkable. Patient is status post cholecystectomy. There is no evidence of intrahepatic or extrahepatic biliary dilatation.   Pancreas: Unremarkable   Spleen: Unremarkable   Adrenals/Urinary Tract: A 13 mm nonobstructing LEFT LOWER pole renal calculus is again noted. No other renal or adrenal abnormalities are noted. Visualized bladder is unremarkable.   Stomach/Bowel: Gastric bypass changes are present. Mild gastric distension is nonspecific. There is mild distension of a small bowel loop just proximal to the enteroenteric anastomosis. No other dilated bowel loops are identified however.   No bowel wall thickening or inflammatory changes noted. The appendix is normal.   Vascular/Lymphatic: No significant vascular findings are present. No enlarged abdominal or pelvic lymph nodes.   Reproductive: Uterus and bilateral adnexa are unremarkable except for an IUD.   Other: No ascites, focal collection or pneumoperitoneum.   Musculoskeletal: No acute or suspicious bony abnormalities are noted. 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   IMPRESSION: 1. Gastric bypass changes with mild gastric distension and mild distension of a small bowel loop just proximal to the enteroenteric anastomosis. This is nonspecific, slightly favor ileus over developing or partial small-bowel obstruction. 2. 13 mm nonobstructing LEFT LOWER pole renal calculus. 3. 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     Electronically Signed   By: Margarette Canada M.D.   On: 02/16/2022 14:18   Assessment:   pSBO, possibly at point of gastroentero anastamosis as noted above.  Plan:   SBFT, NPO, IVF. Seral abdominal exams  Will likely need to be seen by bariatrics as outpt for  f/u for recurrent issues possibly at [facility]   The patient verbalized understanding and all questions were answered to the patient's satisfaction.  labs/images/medications/previous chart entries reviewed personally and relevant changes/updates noted above.

## 2022-02-16 NOTE — ED Notes (Signed)
Report to john, rn

## 2022-02-16 NOTE — ED Provider Notes (Signed)
[facility] Provider Note    Event Date/Time   First MD Initiated Contact with Patient 02/16/22 1619     (approximate)   History   Abdominal Pain (Heart burn)   HPI  Maria Fuller is a 36 y.o. female with past medical history significant for history of duodenal switch at [facility], incisional hernia, who presents to the emergency department with abdominal pain.  Patient states that she developed severe abdominal pain and has been ongoing for the past 2 days.  States that she is unable to keep anything down.  Complaining of pain to her upper abdomen associated with nausea and vomiting.  Last bowel movement was yesterday.  Denies passing any flatus today.  Does states that she has a history of bowel obstructions in the past requiring surgical repair.  Denies dysuria, urinary urgency or frequency.  States that she attempted ondansetron at home and was unsuccessful.      Physical Exam   Triage Vital Signs: ED Triage Vitals  Enc Vitals Group     BP 02/16/22 1319 (!) 167/80     Pulse Rate 02/16/22 1319 (!) 58     Resp 02/16/22 1319 18     Temp 02/16/22 1319 98.4 F (36.9 C)     Temp Source 02/16/22 1319 Oral     SpO2 02/16/22 1319 100 %     Weight 02/16/22 1320 250 lb (113.4 kg)     Height 02/16/22 1320 6\' 1"  (1.854 m)     Head Circumference --      Peak Flow --      Pain Score 02/16/22 1320 6     Pain Loc --      Pain Edu? --      Excl. in GC? --     Most recent vital signs: Vitals:   02/16/22 1319 02/16/22 1715  BP: (!) 167/80 129/73  Pulse: (!) 58 (!) 54  Resp: 18 20  Temp: 98.4 F (36.9 C) 98.2 F (36.8 C)  SpO2: 100% 99%    Physical Exam Constitutional:      Appearance: She is well-developed.  HENT:     Head: Atraumatic.  Eyes:     Conjunctiva/sclera: Conjunctivae normal.  Cardiovascular:     Rate and Rhythm: Regular rhythm.  Pulmonary:     Effort: No respiratory distress.  Abdominal:     General: There is no distension.      Tenderness: There is abdominal tenderness (Multiple abdominal scars) in the right upper quadrant and epigastric area.  Musculoskeletal:        General: Normal range of motion.     Cervical back: Normal range of motion.  Skin:    General: Skin is warm.  Neurological:     Mental Status: She is alert. Mental status is at baseline.          IMPRESSION / MDM / ASSESSMENT AND PLAN / ED COURSE  I reviewed the triage vital signs and the nursing notes.  36 year old female with a past medical history significant for gastric bypass, history of bowel obstructions, who presents to the emergency department with abdominal pain and nausea and vomiting.  On arrival patient afebrile, hemodynamically stable.  Differential diagnosis including small bowel obstruction, gastritis/PUD, viral illness, pyelonephritis.  Low suspicion for an atypical ACS picture.    On chart review of prior hospitalizations  - does have a history of complex ovarian cyst.  Patient had an incisional hernia that required surgical operation and March of  this year.  Patient had a 1 day hospitalization and then was discharged.     EKG  RADIOLOGY [facility] interpretation of imaging: CT abdomen and pelvis with bowel dilation in the upper abdomen.  CT scan was read as gastric bypass changes with mild gastric distention and small bowel loop proximal to the enteroenteric anastomosis site favoring ileus slightly over partial small bowel obstruction.  Patient given IV fluids, IV antiemetics and pain medication.  Lab work without significant leukocytosis.  No significant anemia.  Creatinine at baseline.  Pregnancy test is negative.  No significant electrolyte abnormalities.  UA with leukocyte esterase and WBC but no bacteria.  Given concern for possible small bowel obstruction consulted general surgery for further evaluation.  Discussed with Dr. Tonna Boehringer, he will come and evaluate the patient in the emergency department.  On reevaluation  continued to have abdominal pain.  Given another dose of IV pain medication.  Patient admitted to general surgery for partial small bowel obstruction.  ED Results / Procedures / Treatments   Labs (all labs ordered are listed, but only abnormal results are displayed) Labs interpreted as -   No significant leukocytosis.  Creatinine at baseline.  No significant electrolyte abnormalities.  Urine with moderate leukocyte esterase and rare bacteria.  No WBCs.  Not having any dysuria, urinary urgency or frequency.  We will send for urine culture but will not treat for urinary tract infection at this time given that she is asymptomatic. Labs Reviewed  COMPREHENSIVE METABOLIC PANEL - Abnormal; Notable for the following components:      Result Value   Chloride 113 (*)    Calcium 8.2 (*)    Anion gap 2 (*)    All other components within normal limits  URINALYSIS, ROUTINE W REFLEX MICROSCOPIC - Abnormal; Notable for the following components:   Color, Urine YELLOW (*)    APPearance CLEAR (*)    Hgb urine dipstick SMALL (*)    Leukocytes,Ua MODERATE (*)    Bacteria, UA RARE (*)    All other components within normal limits  URINE CULTURE  LIPASE, BLOOD  CBC  HIV ANTIBODY (ROUTINE TESTING W REFLEX)  CBC  CREATININE, SERUM  POC URINE PREG, ED        PROCEDURES:  Critical Care performed: No  Procedures  Patient's presentation is most consistent with acute presentation with potential threat to life or bodily function.   MEDICATIONS ORDERED IN ED: Medications  fentaNYL (SUBLIMAZE) injection 75 mcg (has no administration in time range)  buPROPion (WELLBUTRIN XL) 24 hr tablet 150 mg (has no administration in time range)  sertraline (ZOLOFT) tablet 200 mg (has no administration in time range)  traZODone (DESYREL) tablet 50 mg (has no administration in time range)  traMADol (ULTRAM) tablet 50 mg (has no administration in time range)  HYDROcodone-acetaminophen (NORCO/VICODIN) 5-325 MG per  tablet 1-2 tablet (has no administration in time range)  morphine (PF) 2 MG/ML injection 2 mg (has no administration in time range)  docusate sodium (COLACE) capsule 100 mg (has no administration in time range)  ondansetron (ZOFRAN-ODT) disintegrating tablet 4 mg (has no administration in time range)    Or  ondansetron (ZOFRAN) injection 4 mg (has no administration in time range)  diatrizoate meglumine-sodium (GASTROGRAFIN) 66-10 % solution 90 mL (has no administration in time range)  enoxaparin (LOVENOX) injection 40 mg (has no administration in time range)  0.9 %  sodium chloride infusion (has no administration in time range)  sodium chloride 0.9 % bolus 1,000 mL (  1,000 mLs Intravenous New Bag/Given 02/16/22 1713)  fentaNYL (SUBLIMAZE) injection 50 mcg (50 mcg Intravenous Given 02/16/22 1713)  ondansetron (ZOFRAN) injection 4 mg (4 mg Intravenous Given 02/16/22 1713)    FINAL CLINICAL IMPRESSION(S) / ED DIAGNOSES   Final diagnoses:  Small bowel obstruction ([place])     Rx / DC Orders   ED Discharge Orders     None        Note:  This document was prepared using Dragon voice recognition software and may include unintentional dictation errors.   Nathaniel Man, MD 02/16/22 [phone]

## 2022-02-17 ENCOUNTER — Observation Stay: Payer: BC Managed Care – PPO

## 2022-02-17 ENCOUNTER — Encounter: Payer: Self-pay | Admitting: Surgery

## 2022-02-17 DIAGNOSIS — Z8661 Personal history of infections of the central nervous system: Secondary | ICD-10-CM | POA: Diagnosis not present

## 2022-02-17 DIAGNOSIS — K5669 Other partial intestinal obstruction: Secondary | ICD-10-CM | POA: Diagnosis present

## 2022-02-17 DIAGNOSIS — K56609 Unspecified intestinal obstruction, unspecified as to partial versus complete obstruction: Secondary | ICD-10-CM | POA: Diagnosis present

## 2022-02-17 DIAGNOSIS — Z803 Family history of malignant neoplasm of breast: Secondary | ICD-10-CM | POA: Diagnosis not present

## 2022-02-17 DIAGNOSIS — Z888 Allergy status to other drugs, medicaments and biological substances status: Secondary | ICD-10-CM | POA: Diagnosis not present

## 2022-02-17 DIAGNOSIS — E119 Type 2 diabetes mellitus without complications: Secondary | ICD-10-CM | POA: Diagnosis present

## 2022-02-17 DIAGNOSIS — Z833 Family history of diabetes mellitus: Secondary | ICD-10-CM | POA: Diagnosis not present

## 2022-02-17 DIAGNOSIS — F32A Depression, unspecified: Secondary | ICD-10-CM | POA: Diagnosis present

## 2022-02-17 DIAGNOSIS — Z818 Family history of other mental and behavioral disorders: Secondary | ICD-10-CM | POA: Diagnosis not present

## 2022-02-17 DIAGNOSIS — Z793 Long term (current) use of hormonal contraceptives: Secondary | ICD-10-CM | POA: Diagnosis not present

## 2022-02-17 DIAGNOSIS — Z9049 Acquired absence of other specified parts of digestive tract: Secondary | ICD-10-CM | POA: Diagnosis not present

## 2022-02-17 DIAGNOSIS — Z8052 Family history of malignant neoplasm of bladder: Secondary | ICD-10-CM | POA: Diagnosis not present

## 2022-02-17 DIAGNOSIS — Z881 Allergy status to other antibiotic agents status: Secondary | ICD-10-CM | POA: Diagnosis not present

## 2022-02-17 DIAGNOSIS — Z87442 Personal history of urinary calculi: Secondary | ICD-10-CM | POA: Diagnosis not present

## 2022-02-17 DIAGNOSIS — Z79899 Other long term (current) drug therapy: Secondary | ICD-10-CM | POA: Diagnosis not present

## 2022-02-17 DIAGNOSIS — E282 Polycystic ovarian syndrome: Secondary | ICD-10-CM | POA: Diagnosis present

## 2022-02-17 DIAGNOSIS — E7800 Pure hypercholesterolemia, unspecified: Secondary | ICD-10-CM | POA: Diagnosis present

## 2022-02-17 DIAGNOSIS — K9589 Other complications of other bariatric procedure: Secondary | ICD-10-CM | POA: Diagnosis present

## 2022-02-17 DIAGNOSIS — I10 Essential (primary) hypertension: Secondary | ICD-10-CM | POA: Diagnosis present

## 2022-02-17 DIAGNOSIS — Z880 Allergy status to penicillin: Secondary | ICD-10-CM | POA: Diagnosis not present

## 2022-02-17 DIAGNOSIS — Y848 Other medical procedures as the cause of abnormal reaction of the patient, or of later complication, without mention of misadventure at the time of the procedure: Secondary | ICD-10-CM | POA: Diagnosis present

## 2022-02-17 LAB — HIV ANTIBODY (ROUTINE TESTING W REFLEX): HIV Screen 4th Generation wRfx: NONREACTIVE

## 2022-02-17 MED ORDER — PANTOPRAZOLE SODIUM 20 MG PO TBEC
20.0000 mg | DELAYED_RELEASE_TABLET | Freq: Every day | ORAL | Status: DC
  Administered 2022-02-17 – 2022-02-18 (×2): 20 mg via ORAL
  Filled 2022-02-17 (×3): qty 1

## 2022-02-17 NOTE — ED Notes (Signed)
Assumed care of pt. Pt aaox4. 

## 2022-02-17 NOTE — ED Notes (Signed)
Pt complaining of abdominal cramping after eating half of her burger, per pt doesn't feel comfortable going home today, Dr. Lysle Pearl notified.per MD will put her back on CL diet.

## 2022-02-17 NOTE — Progress Notes (Signed)
Subjective:  CC: Maria Fuller is a 36 y.o. female  Hospital stay day 0,   partial SBO  HPI: Pain better.  Clear liquid diet tolerating.  Had a small BM this a.m. as well.  ROS:  General: Denies weight loss, weight gain, fatigue, fevers, chills, and night sweats. Heart: Denies chest pain, palpitations, racing heart, irregular heartbeat, leg pain or swelling, and decreased activity tolerance. Respiratory: Denies breathing difficulty, shortness of breath, wheezing, cough, and sputum. GI: Denies change in appetite, heartburn, nausea, vomiting, constipation, diarrhea, and blood in stool. GU: Denies difficulty urinating, pain with urinating, urgency, frequency, blood in urine.   Objective:   Temp:  [97.7 F (36.5 C)-98.8 F (37.1 C)] 97.7 F (36.5 C) (10/16 0759) Pulse Rate:  [44-54] 50 (10/16 1237) Resp:  [11-20] 11 (10/16 1237) BP: (105-129)/(49-79) 115/68 (10/16 1237) SpO2:  [95 %-99 %] 98 % (10/16 1237)     Height: 6\' 1"  (185.4 cm) Weight: 113.4 kg BMI (Calculated): 32.99   Intake/Output this shift:   Intake/Output Summary (Last 24 hours) at 02/17/2022 1437 Last data filed at 02/17/2022 0102 Gross per 24 hour  Intake 1235.39 ml  Output --  Net 1235.39 ml    Constitutional :  alert, cooperative, appears stated age, and no distress  Respiratory:  clear to auscultation bilaterally  Cardiovascular:  regular rate and rhythm  Gastrointestinal: soft, non-tender; bowel sounds normal; no masses,  no organomegaly.   Skin: Cool and moist.   Psychiatric: Normal affect, non-agitated, not confused       LABS:     Latest Ref Rng & Units 02/16/2022    1:31 PM 01/20/2022    8:15 PM 01/11/2022    2:30 PM  CMP  Glucose 70 - 99 mg/dL 92  78  86   BUN 6 - 20 mg/dL 12  11  12    Creatinine 0.44 - 1.00 mg/dL 03/13/2022   7.16   Sodium 135 - 145 mmol/L 138  141  141   Potassium 3.5 - 5.1 mmol/L 3.6  3.6  3.5   Chloride 98 - 111 mmol/L 113  110  112   CO2 22 - 32 mmol/L 23  21  25     Calcium 8.9 - 10.3 mg/dL 8.2  8.6  8.5   Total Protein 6.5 - 8.1 g/dL 7.3  7.3  7.2   Total Bilirubin 0.3 - 1.2 mg/dL 1.0  0.8  0.9   Alkaline Phos 38 - 126 U/L 85  85  86   AST 15 - 41 U/L 16  19  21    ALT 0 - 44 U/L 21  23  24        Latest Ref Rng & Units 02/16/2022    1:31 PM 01/20/2022    8:15 PM 01/11/2022    2:30 PM  CBC  WBC 4.0 - 10.5 K/uL 7.0  7.7  6.5   Hemoglobin 12.0 - 15.0 g/dL   02/18/2022   Hematocrit 36.0 - 46.0 % 37.5  34.6  36.6   Platelets 150 - 400 K/uL 295  295  310     RADS: CLINICAL DATA:  Small bowel obstruction. Delayed image following oral contrast.   EXAM: PORTABLE ABDOMEN - 1 VIEW   COMPARISON:  CT of the abdomen and pelvis 02/16/2022   FINDINGS: The bowel gas pattern is normal. No radio-opaque calculi or other significant radiographic abnormality are seen. Oral contrast opacifies the colon and terminal ileum. No other small bowel contrast or  obstruction is present. Lung bases are clear.   IMPRESSION: Oral contrast opacifies the colon and terminal ileum without evidence for obstruction.     Electronically Signed   By: San Morelle M.D.   On: 02/17/2022 06:50 Assessment:   Partial small bowel obstruction.  Resolved on imaging and is tolerating clear liquid diet with BM.  Will advance to regular, to see if toelrates. Close follow-up with bariatric team due to recurrent episodes of partial small bowel obstruction.  labs/images/medications/previous chart entries reviewed personally and relevant changes/updates noted above.

## 2022-02-18 NOTE — TOC Initial Note (Signed)
Transition of Care [facility]) - Initial/Assessment Note    Patient Details  Name: Maria Fuller MRN: [id number] Date of Birth: 22-May-1985  Transition of Care [facility]) CM/SW Contact:    Beverly Sessions, RN Phone Number: 02/18/2022, 2:10 PM  Clinical Narrative:                   Transition of Care (TOC) Screening Note   Patient Details  Name: Maria Fuller Date of Birth: 03-06-1986   Transition of Care [facility]) CM/SW Contact:    Beverly Sessions, RN Phone Number: 02/18/2022, 2:11 PM    Transition of Care Department [facility]) has reviewed patient and no TOC needs have been identified at this time. We will continue to monitor patient advancement through interdisciplinary progression rounds. If new patient transition needs arise, please place a TOC consult.         Patient Goals and CMS Choice        Expected Discharge Plan and Services           Expected Discharge Date: 02/18/22                                    Prior Living Arrangements/Services                       Activities of Daily Living Home Assistive Devices/Equipment: None ADL Screening (condition at time of admission) Patient's cognitive ability adequate to safely complete daily activities?: Yes Is the patient deaf or have difficulty hearing?: No Does the patient have difficulty seeing, even when wearing glasses/contacts?: No Does the patient have difficulty concentrating, remembering, or making decisions?: No Patient able to express need for assistance with ADLs?: Yes Does the patient have difficulty dressing or bathing?: No Independently performs ADLs?: Yes (appropriate for developmental age) Does the patient have difficulty walking or climbing stairs?: No Weakness of Legs: None Weakness of Arms/Hands: None  Permission Sought/Granted                  Emotional Assessment              Admission diagnosis:  Small bowel obstruction ([place]) [K56.609] Partial small bowel  obstruction ([place]) [K56.600] Partial bowel obstruction ([place]) [K56.600] Patient Active Problem List   Diagnosis Date Noted   Partial bowel obstruction ([place]) 02/16/2022   Incarcerated incisional hernia 07/27/2021   Major depressive disorder, recurrent, moderate ([place]) 11/16/2020   Internal hernia 08/04/2019   Partial small bowel obstruction ([place]) 08/04/2019   Right ovarian cyst 09/09/2018   Secondary oligomenorrhea 05/25/2018   Rectal pain 05/25/2018   PCOS (polycystic ovarian syndrome) 05/25/2018   Menometrorrhagia 05/25/2018   Hydronephrosis 09/22/2016   Nephrolithiasis 09/22/2016   Left flank pain 12/11/2015   Right flank pain 10/06/2015   Status post bariatric surgery 09/13/2015   History of hypertension 08/30/2015   History of PCOS 08/30/2015   Vitamin D deficiency 04/20/2015   Diabetes mellitus ([place]) 03/27/2015   Dysmenorrhea 05/25/2014   Rectal bleeding 05/25/2014   Contraception 04/07/2014   Dysfunction of both eustachian tubes 04/07/2014   Migraines 04/07/2014   Hypothyroidism 07/22/2013   Major depressive disorder, single episode 07/22/2013   HTN (hypertension) 07/15/2013   Headache 07/10/2013   Anxiety state 03/05/2013   Morbid obesity with BMI of 50.0-59.9, adult ([place]) 03/05/2013   Preventative health care 03/05/2013   Morbid obesity ([place]) 03/05/2013   History of  diabetes mellitus, type II 03/04/2013   Diabetes mellitus type 2, uncontrolled 03/04/2013   Obstructive sleep apnea 09/14/2012   Hyperlipidemia 07/28/2012   PCP:  Sherre Scarlet, PA-C Pharmacy:   [place], [place] [facility] AT [address] [place] [address] Phone: [phone] Fax: [phone]     Social Determinants of Health (SDOH) Interventions    Readmission Risk Interventions     No data to display

## 2022-02-18 NOTE — Plan of Care (Signed)
Pt alert and oriented x 4. Up ad lib. Pt reports feeling better this am. Vitals stable. HR fluctuates 40-60.  Pt given Norco x 1 this shift.  Problem: Education: Goal: Knowledge of General Education information will improve Description: Including pain rating scale, medication(s)/side effects and non-pharmacologic comfort measures Outcome: Progressing   Problem: Health Behavior/Discharge Planning: Goal: Ability to manage health-related needs will improve Outcome: Progressing   Problem: Clinical Measurements: Goal: Ability to maintain clinical measurements within normal limits will improve Outcome: Progressing Goal: Will remain free from infection Outcome: Progressing Goal: Diagnostic test results will improve Outcome: Progressing Goal: Respiratory complications will improve Outcome: Progressing Goal: Cardiovascular complication will be avoided Outcome: Progressing   Problem: Activity: Goal: Risk for activity intolerance will decrease Outcome: Progressing   Problem: Nutrition: Goal: Adequate nutrition will be maintained Outcome: Progressing   Problem: Coping: Goal: Level of anxiety will decrease Outcome: Progressing   Problem: Elimination: Goal: Will not experience complications related to bowel motility Outcome: Progressing Goal: Will not experience complications related to urinary retention Outcome: Progressing   Problem: Pain Managment: Goal: General experience of comfort will improve Outcome: Progressing   Problem: Safety: Goal: Ability to remain free from injury will improve Outcome: Progressing   Problem: Skin Integrity: Goal: Risk for impaired skin integrity will decrease Outcome: Progressing

## 2022-02-18 NOTE — Discharge Summary (Addendum)
Physician Discharge Summary  Patient ID: Maria Fuller MRN: [id number] DOB/AGE: 37-Sep-1987 37 y.o.  Admit date: 02/16/2022 Discharge date: 02/18/2022  Admission Diagnoses: Partial small bowel obstruction  Discharge Diagnoses:  Same as above  Discharged Condition: good  Hospital Course: admitted for above.  Small bowel follow-through showed contrast in the colon.  Patient started having bowel movement shortly after.  Slowly advance diet and at time of discharge tolerating a regular diet.  At time of d/c, tolerating diet and pain controlled.  Recommend follow-up as an outpatient with bariatric surgery team to discuss possibility of recurrent partial small bowel obstructions at the site of new anastomosis.  CT scan images power shared to [facility]  Consults: None  Discharge Exam: Blood pressure 113/68, pulse (!) 43, temperature 98.4 F (36.9 C), temperature source Oral, resp. rate 14, height 6\' 1"  (1.854 m), weight 113.4 kg, SpO2 100 %. General appearance: alert and no distress GI: soft, non-tender; bowel sounds normal; no masses,  no organomegaly  Disposition:  Discharge disposition: 01-Home or Self Care        Allergies as of 02/18/2022       Reactions   Compazine [prochlorperazine] Other (See Comments)   Panic attack   Ceftin [cefuroxime Axetil] Rash   Tolerated cefazolin before   Penicillins Rash   TOLERATED CEFAZOLIN BEFORE Has patient had a PCN reaction causing immediate rash, facial/tongue/throat swelling, SOB or lightheadedness with hypotension: Yes Has patient had a PCN reaction causing severe rash involving mucus membranes or skin necrosis: No Has patient had a PCN reaction that required hospitalization No Has patient had a PCN reaction occurring within the last 10 years: No If all of the above answers are "NO", then may proceed with Cephalosporin use.        Medication List     STOP taking these medications    cefdinir 300 MG capsule Commonly known as:  OMNICEF   HYDROcodone-acetaminophen 5-325 MG tablet Commonly known as: NORCO/VICODIN   ondansetron 4 MG disintegrating tablet Commonly known as: ZOFRAN-ODT       TAKE these medications    buPROPion 150 MG 24 hr tablet Commonly known as: WELLBUTRIN XL Take 150 mg by mouth daily.   dicyclomine 20 MG tablet Commonly known as: BENTYL Take 20 mg by mouth every 6 (six) hours.   levonorgestrel 20 MCG/24HR IUD Commonly known as: MIRENA 1 each by Intrauterine route once.   pantoprazole 40 MG tablet Commonly known as: PROTONIX Take 40 mg by mouth daily.   sertraline 100 MG tablet Commonly known as: ZOLOFT Take 100 mg by mouth 2 (two) times daily.   traZODone 50 MG tablet Commonly known as: DESYREL Take 50 mg by mouth at bedtime.        Follow-up Information     [place], Abrish Erny, DO Follow up.   Specialty: Surgery Why: As needed Contact information: [place] [address] [phone]                  Total time spent arranging discharge was >42min. Signed: Benjamine Sprague 02/18/2022, 2:51 PM

## 2022-02-19 LAB — URINE CULTURE: Culture: 80000 — AB

## 2022-04-16 ENCOUNTER — Emergency Department: Payer: BC Managed Care – PPO

## 2022-04-16 ENCOUNTER — Encounter: Payer: Self-pay | Admitting: Emergency Medicine

## 2022-04-16 ENCOUNTER — Other Ambulatory Visit: Payer: Self-pay

## 2022-04-16 ENCOUNTER — Emergency Department
Admission: EM | Admit: 2022-04-16 | Discharge: 2022-04-16 | Disposition: A | Discharge status: Home / Self Care | Payer: BC Managed Care – PPO | Attending: Student in an Organized Health Care Education/Training Program | Admitting: Student in an Organized Health Care Education/Training Program

## 2022-04-16 DIAGNOSIS — N200 Calculus of kidney: Secondary | ICD-10-CM | POA: Insufficient documentation

## 2022-04-16 DIAGNOSIS — R109 Unspecified abdominal pain: Secondary | ICD-10-CM | POA: Diagnosis present

## 2022-04-16 LAB — URINALYSIS, ROUTINE W REFLEX MICROSCOPIC
Bilirubin Urine: NEGATIVE
Glucose, UA: NEGATIVE mg/dL
Ketones, ur: NEGATIVE mg/dL
Nitrite: NEGATIVE
Protein, ur: 30 mg/dL — AB
RBC / HPF: >50 RBC/hpf — ABNORMAL HIGH (ref 0–5)
Specific Gravity, Urine: 1.016 (ref 1.005–1.030)
WBC, UA: >50 WBC/hpf — ABNORMAL HIGH (ref 0–5)
pH: 5 (ref 5.0–8.0)

## 2022-04-16 LAB — CBC WITH DIFFERENTIAL/PLATELET
Abs Immature Granulocytes: 0.03 10*3/uL (ref 0.00–0.07)
Basophils Absolute: 0 10*3/uL (ref 0.0–0.1)
Basophils Relative: 0 %
Eosinophils Absolute: 0.3 10*3/uL (ref 0.0–0.5)
Eosinophils Relative: 3 %
HCT: 34.5 % — ABNORMAL LOW (ref 36.0–46.0)
Hemoglobin: 11.2 g/dL — ABNORMAL LOW (ref 12.0–15.0)
Immature Granulocytes: 0 %
Lymphocytes Relative: 25 %
Lymphs Abs: 1.9 10*3/uL (ref 0.7–4.0)
MCH: 30.2 pg (ref 26.0–34.0)
MCHC: 32.5 g/dL (ref 30.0–36.0)
MCV: 93 fL (ref 80.0–100.0)
Monocytes Absolute: 0.5 10*3/uL (ref 0.1–1.0)
Monocytes Relative: 7 %
Neutro Abs: 4.9 10*3/uL (ref 1.7–7.7)
Neutrophils Relative %: 65 %
Platelets: 314 10*3/uL (ref 150–400)
RBC: 3.71 MIL/uL — ABNORMAL LOW (ref 3.87–5.11)
RDW: 12.5 % (ref 11.5–15.5)
WBC: 7.6 10*3/uL (ref 4.0–10.5)
nRBC: 0 % (ref 0.0–0.2)

## 2022-04-16 LAB — COMPREHENSIVE METABOLIC PANEL
ALT: 18 U/L (ref 0–44)
AST: 17 U/L (ref 15–41)
Albumin: 3.9 g/dL (ref 3.5–5.0)
Alkaline Phosphatase: 83 U/L (ref 38–126)
Anion gap: 5 (ref 5–15)
BUN: 11 mg/dL (ref 6–20)
CO2: 24 mmol/L (ref 22–32)
Calcium: 8.4 mg/dL — ABNORMAL LOW (ref 8.9–10.3)
Chloride: 111 mmol/L (ref 98–111)
Creatinine, Ser: 0.55 mg/dL (ref 0.44–1.00)
GFR, Estimated: >60 mL/min (ref >60)
Glucose, Bld: 90 mg/dL (ref 70–99)
Potassium: 3.8 mmol/L (ref 3.5–5.1)
Sodium: 140 mmol/L (ref 135–145)
Total Bilirubin: 0.6 mg/dL (ref 0.3–1.2)
Total Protein: 7.1 g/dL (ref 6.5–8.1)

## 2022-04-16 LAB — PREGNANCY, URINE: Preg Test, Ur: NEGATIVE

## 2022-04-16 MED ORDER — OXYCODONE-ACETAMINOPHEN 5-325 MG PO TABS: 1.0000 | ORAL_TABLET | ORAL | 0 refills | Status: DC | PRN

## 2022-04-16 MED ORDER — SODIUM CHLORIDE 0.9 % IV BOLUS
500.0000 mL | Freq: Once | INTRAVENOUS | Status: AC
  Administered 2022-04-16: 500 mL via INTRAVENOUS

## 2022-04-16 MED ORDER — KETOROLAC TROMETHAMINE 30 MG/ML IJ SOLN
15.0000 mg | Freq: Once | INTRAMUSCULAR | Status: AC
  Administered 2022-04-16: 15 mg via INTRAVENOUS
  Filled 2022-04-16: qty 1

## 2022-04-16 MED ORDER — CEPHALEXIN 500 MG PO CAPS: 500.0000 mg | ORAL_CAPSULE | Freq: Two times a day (BID) | ORAL | 0 refills | Status: AC

## 2022-04-16 MED ORDER — TAMSULOSIN HCL 0.4 MG PO CAPS: 0.4000 mg | ORAL_CAPSULE | Freq: Every day | ORAL | 0 refills | Status: DC

## 2022-04-16 MED ORDER — ONDANSETRON HCL 4 MG/2ML IJ SOLN
4.0000 mg | Freq: Once | INTRAMUSCULAR | Status: AC
  Administered 2022-04-16: 4 mg via INTRAVENOUS
  Filled 2022-04-16: qty 2

## 2022-04-16 MED ORDER — CEPHALEXIN 500 MG PO CAPS
500.0000 mg | ORAL_CAPSULE | Freq: Once | ORAL | Status: AC
  Administered 2022-04-16: 500 mg via ORAL
  Filled 2022-04-16: qty 1

## 2022-04-16 MED ORDER — ONDANSETRON 4 MG PO TBDP: 4.0000 mg | ORAL_TABLET | Freq: Three times a day (TID) | ORAL | 0 refills | Status: DC | PRN

## 2022-04-16 MED ORDER — MORPHINE SULFATE (PF) 4 MG/ML IV SOLN
4.0000 mg | INTRAVENOUS | Status: DC | PRN
  Administered 2022-04-16: 4 mg via INTRAVENOUS
  Filled 2022-04-16: qty 1

## 2022-04-16 MED ORDER — KETOROLAC TROMETHAMINE 30 MG/ML IJ SOLN: 15.0000 mg | Freq: Once | INTRAMUSCULAR | Status: DC

## 2022-04-16 NOTE — ED Notes (Signed)
Pt to STAT desk to st that she passed her stone in the BR; specimen cup given to pt to contain it

## 2022-04-16 NOTE — ED Triage Notes (Signed)
Patient ambulatory to triage with steady gait, without difficulty or distress noted; pt reports since 3am having left side/flank pain radiating into groin; st hx of same with kidney stone

## 2022-04-16 NOTE — ED Provider Notes (Signed)
[facility] Ed Provider Note    Event Date/Time   First MD Initiated Contact with Patient 04/16/22 [phone]     (approximate)   History   Flank Pain   HPI  Maria Fuller is a 36 y.o. female with a history of kidney stones presents to the ER for evaluation of sudden onset left flank pain radiating into her left groin around 3 AM this morning.  Associated nausea.  Has been having some dysuria for the past few days no measured fevers or chills.  No vomiting.  Denies any chest pain or shortness of breath.  Says she was admitted to the hospital for partial bowel obstruction about 2 months ago was treated at that time for UTI with Macrobid.  She is not currently on any antibiotics.     Physical Exam   Triage Vital Signs: ED Triage Vitals  Enc Vitals Group     BP 04/16/22 0535 (!) 150/78     Pulse Rate 04/16/22 0535 72     Resp 04/16/22 0535 20     Temp 04/16/22 0535 98.6 F (37 C)     Temp Source 04/16/22 0535 Oral     SpO2 04/16/22 0535 99 %     Weight 04/16/22 0528 240 lb (108.9 kg)     Height 04/16/22 0528 6' (1.829 m)     Head Circumference --      Peak Flow --      Pain Score 04/16/22 0528 10     Pain Loc --      Pain Edu? --      Excl. in GC? --     Most recent vital signs: Vitals:   04/16/22 0900 04/16/22 0903  BP:  117/70  Pulse: 67 67  Resp:  18  Temp:    SpO2: 97% 97%     Constitutional: Alert  Eyes: Conjunctivae are normal.  Head: Atraumatic. Nose: No congestion/rhinnorhea. Mouth/Throat: Mucous membranes are moist.   Neck: Painless ROM.  Cardiovascular:   Good peripheral circulation. Respiratory: Normal respiratory effort.  No retractions.  Gastrointestinal: Soft and nontender.  Musculoskeletal:  no deformity Neurologic:  MAE spontaneously. No gross focal neurologic deficits are appreciated.  Skin:  Skin is warm, dry and intact. No rash noted. Psychiatric: Mood and affect are normal. Speech and behavior are normal.    ED  Results / Procedures / Treatments   Labs (all labs ordered are listed, but only abnormal results are displayed) Labs Reviewed  CBC WITH DIFFERENTIAL/PLATELET - Abnormal; Notable for the following components:      Result Value   RBC 3.71 (*)    Hemoglobin 11.2 (*)    HCT 34.5 (*)    All other components within normal limits  COMPREHENSIVE METABOLIC PANEL - Abnormal; Notable for the following components:   Calcium 8.4 (*)    All other components within normal limits  URINALYSIS, ROUTINE W REFLEX MICROSCOPIC - Abnormal; Notable for the following components:   Color, Urine YELLOW (*)    APPearance CLOUDY (*)    Hgb urine dipstick LARGE (*)    Protein, ur 30 (*)    Leukocytes,Ua LARGE (*)    RBC / HPF >50 (*)    WBC, UA >50 (*)    Bacteria, UA RARE (*)    All other components within normal limits  URINE CULTURE  PREGNANCY, URINE     EKG     RADIOLOGY Please see ED Course for my review and interpretation.  I  personally reviewed all radiographic images ordered to evaluate for the above acute complaints and reviewed radiology reports and findings.  These findings were personally discussed with the patient.  Please see medical record for radiology report.    PROCEDURES:  Critical Care performed: No  Procedures   MEDICATIONS ORDERED IN ED: Medications  morphine (PF) 4 MG/ML injection 4 mg (4 mg Intravenous Given 04/16/22 0820)  ondansetron (ZOFRAN) injection 4 mg (4 mg Intravenous Given 04/16/22 0818)  sodium chloride 0.9 % bolus 500 mL (0 mLs Intravenous Stopped 04/16/22 0900)  ketorolac (TORADOL) 30 MG/ML injection 15 mg (15 mg Intravenous Given 04/16/22 0818)  cephALEXin (KEFLEX) capsule 500 mg (500 mg Oral Given 04/16/22 0824)     IMPRESSION / MDM / ASSESSMENT AND PLAN / ED COURSE  I reviewed the triage vital signs and the nursing notes.                              Differential diagnosis includes, but is not limited to, kidney stone, pyelo-, abscess,  cystitis, colitis, SBO, musculoskeletal strain, AAA  Patient presenting to the ER for evaluation of symptoms as described above.  Based on symptoms, risk factors and considered above differential, this presenting complaint could reflect a potentially life-threatening illness therefore the patient will be placed on continuous pulse oximetry and telemetry for monitoring.  Laboratory evaluation will be sent to evaluate for the above complaints.      Clinical Course as of 04/16/22 1001  Wed Apr 16, 2022  0803 CT imaging on my review and interpretation shows evidence of left-sided ureteral stone with mild hydro-.  She does not have any white count.  Her renal function is normal.  Urinalysis is cloudy with rare bacteria and negative nitrites but positive leuk esterase.  Based on these findings while she is not septic I will consult urology for further recommendations. [PR]    Clinical Course User Index [PR] Willy Eddy, MD   Patient well-appearing symptoms significantly improved.  On review of record patient grew out Proteus.  Will cover with Keflex.  Patient will follow-up with urology on Friday.  Discussed return precautions.  Patient agreed to plan.  FINAL CLINICAL IMPRESSION(S) / ED DIAGNOSES   Final diagnoses:  Kidney stone     Rx / DC Orders   ED Discharge Orders          Ordered    oxyCODONE-acetaminophen (PERCOCET) 5-325 MG tablet  Every 4 hours PRN        04/16/22 0819    tamsulosin (FLOMAX) 0.4 MG CAPS capsule  Daily after supper        04/16/22 0819    ondansetron (ZOFRAN-ODT) 4 MG disintegrating tablet  Every 8 hours PRN        04/16/22 0819    cephALEXin (KEFLEX) 500 MG capsule  2 times daily        04/16/22 8144             Note:  This document was prepared using Dragon voice recognition software and may include unintentional dictation errors.    Willy Eddy, MD 04/16/22 1001

## 2022-04-16 NOTE — Discharge Instructions (Addendum)
Follow up with Dr. Apolinar Junes at the below address at 2:45pm Friday: [facility] [place] [address] [place], [place] [address] [phone]

## 2022-04-18 ENCOUNTER — Observation Stay
Admit: 2022-04-18 | Discharge: 2022-04-19 | Disposition: A | Discharge status: Home / Self Care | Payer: BC Managed Care – PPO | Source: Ambulatory Visit | Attending: Urology | Admitting: Urology

## 2022-04-18 ENCOUNTER — Encounter: Payer: Self-pay | Admitting: Urology

## 2022-04-18 ENCOUNTER — Ambulatory Visit: Payer: BC Managed Care – PPO | Admitting: Anesthesiology

## 2022-04-18 ENCOUNTER — Other Ambulatory Visit: Payer: Self-pay

## 2022-04-18 ENCOUNTER — Ambulatory Visit: Payer: BC Managed Care – PPO

## 2022-04-18 ENCOUNTER — Ambulatory Visit (INDEPENDENT_AMBULATORY_CARE_PROVIDER_SITE_OTHER): Payer: Self-pay | Admitting: Urology

## 2022-04-18 ENCOUNTER — Encounter: Disposition: A | Discharge status: Home / Self Care | Payer: Self-pay | Source: Ambulatory Visit | Attending: Urology

## 2022-04-18 VITALS — BP 128/80 | HR 80 | Temp 99.2°F | Ht 73.0 in | Wt 240.0 lb

## 2022-04-18 DIAGNOSIS — N390 Urinary tract infection, site not specified: Secondary | ICD-10-CM | POA: Diagnosis present

## 2022-04-18 DIAGNOSIS — R509 Fever, unspecified: Secondary | ICD-10-CM

## 2022-04-18 DIAGNOSIS — N201 Calculus of ureter: Secondary | ICD-10-CM

## 2022-04-18 DIAGNOSIS — Z01818 Encounter for other preprocedural examination: Secondary | ICD-10-CM

## 2022-04-18 DIAGNOSIS — N200 Calculus of kidney: Secondary | ICD-10-CM

## 2022-04-18 DIAGNOSIS — I10 Essential (primary) hypertension: Secondary | ICD-10-CM | POA: Diagnosis not present

## 2022-04-18 DIAGNOSIS — N210 Calculus in bladder: Secondary | ICD-10-CM

## 2022-04-18 DIAGNOSIS — R001 Bradycardia, unspecified: Secondary | ICD-10-CM | POA: Diagnosis not present

## 2022-04-18 DIAGNOSIS — N202 Calculus of kidney with calculus of ureter: Secondary | ICD-10-CM | POA: Diagnosis present

## 2022-04-18 HISTORY — PX: CYSTOSCOPY WITH STENT PLACEMENT: SHX5790

## 2022-04-18 LAB — CREATININE, SERUM
Creatinine, Ser: 0.62 mg/dL (ref 0.44–1.00)
GFR, Estimated: >60 mL/min (ref >60)

## 2022-04-18 LAB — CBC
HCT: 32.5 % — ABNORMAL LOW (ref 36.0–46.0)
Hemoglobin: 10.8 g/dL — ABNORMAL LOW (ref 12.0–15.0)
MCH: 30.5 pg (ref 26.0–34.0)
MCHC: 33.2 g/dL (ref 30.0–36.0)
MCV: 91.8 fL (ref 80.0–100.0)
Platelets: 241 10*3/uL (ref 150–400)
RBC: 3.54 MIL/uL — ABNORMAL LOW (ref 3.87–5.11)
RDW: 12.6 % (ref 11.5–15.5)
WBC: 6.5 10*3/uL (ref 4.0–10.5)
nRBC: 0 % (ref 0.0–0.2)

## 2022-04-18 LAB — GLUCOSE, CAPILLARY
Glucose-Capillary: 77 mg/dL (ref 70–99)
Glucose-Capillary: 105 mg/dL — ABNORMAL HIGH (ref 70–99)

## 2022-04-18 LAB — POCT PREGNANCY, URINE: Preg Test, Ur: NEGATIVE

## 2022-04-18 SURGERY — CYSTOSCOPY, WITH STENT INSERTION
Anesthesia: General | Laterality: Left

## 2022-04-18 MED ORDER — FLEET ENEMA 7-19 GM/118ML RE ENEM: 1.0000 | ENEMA | Freq: Once | RECTAL | Status: DC | PRN

## 2022-04-18 MED ORDER — ACETAMINOPHEN 325 MG PO TABS: 650.0000 mg | ORAL_TABLET | ORAL | Status: DC | PRN

## 2022-04-18 MED ORDER — ONDANSETRON HCL 4 MG/2ML IJ SOLN: 4.0000 mg | Freq: Once | INTRAMUSCULAR | Status: DC | PRN

## 2022-04-18 MED ORDER — CEFTRIAXONE SODIUM 500 MG IJ SOLR
500.0000 mg | Freq: Once | INTRAMUSCULAR | Status: AC
  Administered 2022-04-18: 1000 mg via INTRAMUSCULAR

## 2022-04-18 MED ORDER — FENTANYL CITRATE (PF) 100 MCG/2ML IJ SOLN
INTRAMUSCULAR | Status: DC | PRN
  Administered 2022-04-18 (×2): 50 ug via INTRAVENOUS

## 2022-04-18 MED ORDER — ZOLPIDEM TARTRATE 5 MG PO TABS: 5.0000 mg | ORAL_TABLET | Freq: Every evening | ORAL | Status: DC | PRN

## 2022-04-18 MED ORDER — TRAZODONE HCL 50 MG PO TABS
50.0000 mg | ORAL_TABLET | Freq: Every day | ORAL | Status: DC
  Administered 2022-04-18: 50 mg via ORAL
  Filled 2022-04-18: qty 1

## 2022-04-18 MED ORDER — CHLORHEXIDINE GLUCONATE 0.12 % MT SOLN
OROMUCOSAL | Status: AC
  Filled 2022-04-18: qty 15

## 2022-04-18 MED ORDER — OXYBUTYNIN CHLORIDE 5 MG PO TABS: 5.0000 mg | ORAL_TABLET | Freq: Three times a day (TID) | ORAL | Status: DC | PRN

## 2022-04-18 MED ORDER — PANTOPRAZOLE SODIUM 40 MG PO TBEC
40.0000 mg | DELAYED_RELEASE_TABLET | Freq: Every day | ORAL | Status: DC
  Administered 2022-04-19: 40 mg via ORAL
  Filled 2022-04-18: qty 1

## 2022-04-18 MED ORDER — ONDANSETRON HCL 4 MG/2ML IJ SOLN
INTRAMUSCULAR | Status: DC | PRN
  Administered 2022-04-18: 4 mg via INTRAVENOUS

## 2022-04-18 MED ORDER — OXYCODONE HCL 5 MG PO TABS
ORAL_TABLET | ORAL | Status: AC
  Filled 2022-04-18: qty 1

## 2022-04-18 MED ORDER — ORAL CARE MOUTH RINSE: 15.0000 mL | Freq: Once | OROMUCOSAL | Status: AC

## 2022-04-18 MED ORDER — SENNOSIDES-DOCUSATE SODIUM 8.6-50 MG PO TABS: 1.0000 | ORAL_TABLET | Freq: Every evening | ORAL | Status: DC | PRN

## 2022-04-18 MED ORDER — DIPHENHYDRAMINE HCL 12.5 MG/5ML PO ELIX: 12.5000 mg | ORAL_SOLUTION | Freq: Four times a day (QID) | ORAL | Status: DC | PRN

## 2022-04-18 MED ORDER — DEXAMETHASONE SODIUM PHOSPHATE 10 MG/ML IJ SOLN
INTRAMUSCULAR | Status: DC | PRN
  Administered 2022-04-18: 10 mg via INTRAVENOUS

## 2022-04-18 MED ORDER — FENTANYL CITRATE (PF) 100 MCG/2ML IJ SOLN
INTRAMUSCULAR | Status: AC
  Filled 2022-04-18: qty 2

## 2022-04-18 MED ORDER — ACETAMINOPHEN 10 MG/ML IV SOLN
1000.0000 mg | Freq: Once | INTRAVENOUS | Status: AC
  Administered 2022-04-18: 1000 mg via INTRAVENOUS

## 2022-04-18 MED ORDER — MIDAZOLAM HCL 2 MG/2ML IJ SOLN
INTRAMUSCULAR | Status: AC
  Filled 2022-04-18: qty 2

## 2022-04-18 MED ORDER — SCOPOLAMINE 1 MG/3DAYS TD PT72: 1.0000 | MEDICATED_PATCH | TRANSDERMAL | Status: DC

## 2022-04-18 MED ORDER — SUCCINYLCHOLINE CHLORIDE 200 MG/10ML IV SOSY
PREFILLED_SYRINGE | INTRAVENOUS | Status: DC | PRN
  Administered 2022-04-18: 80 mg via INTRAVENOUS

## 2022-04-18 MED ORDER — DIPHENHYDRAMINE HCL 50 MG/ML IJ SOLN: 12.5000 mg | Freq: Four times a day (QID) | INTRAMUSCULAR | Status: DC | PRN

## 2022-04-18 MED ORDER — TAMSULOSIN HCL 0.4 MG PO CAPS: 0.4000 mg | ORAL_CAPSULE | Freq: Every day | ORAL | Status: DC

## 2022-04-18 MED ORDER — KETOROLAC TROMETHAMINE 30 MG/ML IJ SOLN
INTRAMUSCULAR | Status: DC | PRN
  Administered 2022-04-18: 30 mg via INTRAVENOUS

## 2022-04-18 MED ORDER — OXYCODONE HCL 5 MG PO TABS
5.0000 mg | ORAL_TABLET | ORAL | Status: DC | PRN
  Administered 2022-04-18 – 2022-04-19 (×3): 5 mg via ORAL
  Filled 2022-04-18 (×2): qty 1

## 2022-04-18 MED ORDER — LIDOCAINE HCL (CARDIAC) PF 100 MG/5ML IV SOSY
PREFILLED_SYRINGE | INTRAVENOUS | Status: DC | PRN
  Administered 2022-04-18: 100 mg via INTRAVENOUS

## 2022-04-18 MED ORDER — ONDANSETRON HCL 4 MG/2ML IJ SOLN: 4.0000 mg | INTRAMUSCULAR | Status: DC | PRN

## 2022-04-18 MED ORDER — FENTANYL CITRATE (PF) 100 MCG/2ML IJ SOLN
INTRAMUSCULAR | Status: AC
  Administered 2022-04-18: 25 ug via INTRAVENOUS
  Filled 2022-04-18: qty 2

## 2022-04-18 MED ORDER — POTASSIUM CHLORIDE IN NACL 20-0.45 MEQ/L-% IV SOLN
INTRAVENOUS | Status: DC
  Filled 2022-04-18 (×5): qty 1000

## 2022-04-18 MED ORDER — CHLORHEXIDINE GLUCONATE 0.12 % MT SOLN
15.0000 mL | Freq: Once | OROMUCOSAL | Status: AC
  Administered 2022-04-18: 15 mL via OROMUCOSAL

## 2022-04-18 MED ORDER — STERILE WATER FOR IRRIGATION IR SOLN
Status: DC | PRN
  Administered 2022-04-18: 200 mL

## 2022-04-18 MED ORDER — HYDROMORPHONE HCL 1 MG/ML IJ SOLN: 0.5000 mg | INTRAMUSCULAR | Status: DC | PRN

## 2022-04-18 MED ORDER — MIDAZOLAM HCL 2 MG/2ML IJ SOLN
INTRAMUSCULAR | Status: DC | PRN
  Administered 2022-04-18: 2 mg via INTRAVENOUS

## 2022-04-18 MED ORDER — SODIUM CHLORIDE 0.9 % IV SOLN: INTRAVENOUS | Status: DC

## 2022-04-18 MED ORDER — SERTRALINE HCL 50 MG PO TABS
100.0000 mg | ORAL_TABLET | Freq: Two times a day (BID) | ORAL | Status: DC
  Administered 2022-04-18 – 2022-04-19 (×2): 100 mg via ORAL
  Filled 2022-04-18 (×2): qty 2

## 2022-04-18 MED ORDER — DICYCLOMINE HCL 20 MG PO TABS
20.0000 mg | ORAL_TABLET | Freq: Four times a day (QID) | ORAL | Status: DC
  Administered 2022-04-18 – 2022-04-19 (×2): 20 mg via ORAL
  Filled 2022-04-18 (×3): qty 1

## 2022-04-18 MED ORDER — SODIUM CHLORIDE 0.9 % IV SOLN
1.0000 g | INTRAVENOUS | Status: DC
  Filled 2022-04-18: qty 10

## 2022-04-18 MED ORDER — BUPROPION HCL ER (XL) 150 MG PO TB24
150.0000 mg | ORAL_TABLET | Freq: Every day | ORAL | Status: DC
  Administered 2022-04-19: 150 mg via ORAL
  Filled 2022-04-18: qty 1

## 2022-04-18 MED ORDER — ACETAMINOPHEN 10 MG/ML IV SOLN
INTRAVENOUS | Status: AC
  Filled 2022-04-18: qty 100

## 2022-04-18 MED ORDER — FENTANYL CITRATE (PF) 100 MCG/2ML IJ SOLN
25.0000 ug | INTRAMUSCULAR | Status: DC | PRN
  Administered 2022-04-18 (×5): 25 ug via INTRAVENOUS

## 2022-04-18 MED ORDER — PROPOFOL 10 MG/ML IV BOLUS
INTRAVENOUS | Status: DC | PRN
  Administered 2022-04-18: 150 mg via INTRAVENOUS

## 2022-04-18 MED ORDER — SODIUM CHLORIDE 0.9 % IR SOLN
Status: DC | PRN
  Administered 2022-04-18: 1000 mL

## 2022-04-18 MED ORDER — ENOXAPARIN SODIUM 40 MG/0.4ML IJ SOSY
40.0000 mg | PREFILLED_SYRINGE | INTRAMUSCULAR | Status: DC
  Administered 2022-04-19: 40 mg via SUBCUTANEOUS
  Filled 2022-04-18: qty 0.4

## 2022-04-18 MED ORDER — BISACODYL 10 MG RE SUPP: 10.0000 mg | Freq: Every day | RECTAL | Status: DC | PRN

## 2022-04-18 SURGICAL SUPPLY — 16 items
BAG DRAIN SIEMENS DORNER NS (MISCELLANEOUS) ×1 IMPLANT
CATH URETL OPEN 5X70 (CATHETERS) ×1 IMPLANT
GLOVE BIO SURGEON STRL SZ8 (GLOVE) ×1 IMPLANT
GOWN STRL REUS W/ TWL LRG LVL4 (GOWN DISPOSABLE) ×1 IMPLANT
GOWN STRL REUS W/ TWL XL LVL3 (GOWN DISPOSABLE) ×1 IMPLANT
GOWN STRL REUS W/TWL LRG LVL4 (GOWN DISPOSABLE) ×1
GOWN STRL REUS W/TWL XL LVL3 (GOWN DISPOSABLE) ×1
GUIDEWIRE STR DUAL SENSOR (WIRE) ×2 IMPLANT
IV NS IRRIG 3000ML ARTHROMATIC (IV SOLUTION) ×1 IMPLANT
KIT TURNOVER CYSTO (KITS) ×1 IMPLANT
PACK CYSTO AR (MISCELLANEOUS) ×1 IMPLANT
SET CYSTO W/LG BORE CLAMP LF (SET/KITS/TRAYS/PACK) ×1 IMPLANT
STENT URET 6FRX26 CONTOUR (STENTS) IMPLANT
TRAP FLUID SMOKE EVACUATOR (MISCELLANEOUS) ×1 IMPLANT
WATER STERILE IRR 1000ML POUR (IV SOLUTION) ×1 IMPLANT
WATER STERILE IRR 500ML POUR (IV SOLUTION) ×1 IMPLANT

## 2022-04-18 NOTE — Transfer of Care (Signed)
Immediate Anesthesia Transfer of Care Note  Patient: Maria Fuller  Procedure(s) Performed: CYSTOSCOPY WITH STENT PLACEMENT (Left)  Patient Location: PACU  Anesthesia Type:General  Level of Consciousness: drowsy  Airway & Oxygen Therapy: Patient Spontanous Breathing and Patient connected to face mask oxygen  Post-op Assessment: Report given to RN and Post -op Vital signs reviewed and stable  Post vital signs: Reviewed and stable  Last Vitals:  Vitals Value Taken Time  BP 137/70 04/18/22 1800  Temp    Pulse 71 04/18/22 1803  Resp 19 04/18/22 1803  SpO2 100 % 04/18/22 1803  Vitals shown include unvalidated device data.  Last Pain:  Vitals:   04/18/22 1618  TempSrc: Temporal  PainSc: 5          Complications: No notable events documented.

## 2022-04-18 NOTE — Anesthesia Procedure Notes (Signed)
Procedure Name: Intubation Date/Time: 04/18/2022 5:34 PM  Performed by: Philbert Riser, CRNAPre-anesthesia Checklist: Patient identified, Patient being monitored, Timeout performed, Emergency Drugs available and Suction available Patient Re-evaluated:Patient Re-evaluated prior to induction Oxygen Delivery Method: Circle system utilized Preoxygenation: Pre-oxygenation with 100% oxygen Induction Type: IV induction Ventilation: Mask ventilation without difficulty Laryngoscope Size: 3 and McGraph Grade View: Grade I Tube type: Oral Tube size: 7.0 mm Number of attempts: 1 Airway Equipment and Method: Stylet and Video-laryngoscopy Placement Confirmation: ETT inserted through vocal cords under direct vision, positive ETCO2 and breath sounds checked- equal and bilateral Secured at: 21 cm Tube secured with: Tape Dental Injury: Teeth and Oropharynx as per pre-operative assessment

## 2022-04-18 NOTE — Op Note (Signed)
Procedure: 1.  Cystoscopy with insertion of left double-J stent. 2.  Application of fluoroscopy.  Preop diagnosis: Left proximal ureteral stone with febrile UTI.  Postop diagnosis: Same.  Surgeon: Dr. Bjorn Pippin.  Anesthesia: General.  Specimen: None.  Drains: 6 French by 26 cm left contour double-J stent.  EBL: None.  Complications: None.  Indications: The patient is a 36 year old female who was seen by Dr. Apolinar Junes in the office today with a fever and a CT that showed a left proximal ureteral stone with obstruction as well as a larger stone in the left lower pole she had known Proteus and had been on Keflex but still had the fever so it was felt that cystoscopy and stenting was indicated.  Procedure: She had been given Rocephin in the office.  She was taken the operating room where general anesthetic was induced.  She was placed in lithotomy position.  She was prepped with Betadine scrub and draped in usual sterile fashion.  Cystoscopy was performed using a 21 [place] scope and 30 degree lens.  Examination revealed a normal urethra.  The bladder wall was smooth and pale without tumors, stones or inflammation.  Ureteral orifices were unremarkable.  The left ureteral orifice was cannulated with a 5 [place] open-ended catheter which was used to guide a sensor wire by the proximal stone into the kidney without difficulty.  The opening catheter was removed and a 6 [place] by 26 cm left contour double-J stent was advanced over the wire to the kidney under fluoroscopic guidance without difficulty.  The wire was removed, leaving a good coil in the kidney and a good coil in the bladder.  Her bladder was drained.  She was taken down from lithotomy position, her anesthetic was reversed and she was moved to recovery in stable condition.  There were no complications.

## 2022-04-18 NOTE — Anesthesia Preprocedure Evaluation (Signed)
Anesthesia Evaluation  Patient identified by MRN, date of birth, ID band Patient awake    Reviewed: Allergy & Precautions, H&P , NPO status , Patient's Chart, lab work & pertinent test results, reviewed documented beta blocker date and time   Airway Mallampati: II  TM Distance: >3 FB Neck ROM: full    Dental  (+) Teeth Intact   Pulmonary sleep apnea    Pulmonary exam normal        Cardiovascular Exercise Tolerance: Good hypertension, On Medications negative cardio ROS Normal cardiovascular exam Rhythm:regular Rate:Normal     Neuro/Psych  Headaches PSYCHIATRIC DISORDERS Anxiety Depression       GI/Hepatic negative GI ROS, Neg liver ROS,,,  Endo/Other  Well Controlledneg diabetesHypothyroidism    Renal/GU Renal disease  negative genitourinary   Musculoskeletal   Abdominal   Peds  Hematology  (+) Blood dyscrasia, anemia   Anesthesia Other Findings Past Medical History: No date: Anemia     Comment:  slight No date: Bradycardia No date: Depression No date: Diabetes mellitus without complication (HCC)     Comment:  resolved with weight loss from gastric bypass No date: Headache     Comment:  migraines. resolved since gastric bypass No date: High cholesterol     Comment:  resolved since weight loss surgery No date: History of kidney stones     Comment:  last week No date: Hypertension     Comment:  resolved since gastric bypass, severe pre-eclampsia with              G1 No date: Low BP 2015: Meningitis No date: PCOS (polycystic ovarian syndrome) No date: Preterm labor No date: Sleep apnea     Comment:  had gastric bypass surgery and now resolved No date: Syncope couple years ago: Vertigo Past Surgical History: No date: CESAREAN SECTION 05/07/2016: CESAREAN SECTION; N/A     Comment:  Procedure: CESAREAN SECTION;  Surgeon: Nadara Mustard,               MD;  Location: [facility] ORS;  Service: Obstetrics;                 Laterality: N/A;  Time of birth: [birth year] Sex:               Female Weight: 3940 kg, 8 lb 11 oz No date: CHOLECYSTECTOMY 09/19/2016: CYSTOSCOPY WITH STENT PLACEMENT; Right     Comment:  Procedure: CYSTOSCOPY WITH STENT PLACEMENT;  Surgeon:               Hildred Laser, MD;  Location: [facility] ORS;  Service:               Urology;  Laterality: Right; 07/28/2019: FLEXIBLE SIGMOIDOSCOPY; N/A     Comment:  Procedure: FLEXIBLE SIGMOIDOSCOPY;  Surgeon: Toney Reil, MD;  Location[facility];                Service: Endoscopy;  Laterality: N/A; 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 09/28/2018: LAPAROSCOPIC OVARIAN CYSTECTOMY; Right     Comment:  Procedure: LAPAROSCOPIC RIGHT OVARIAN CYSTECTOMY;                Surgeon: Nadara Mustard, MD;  Location: [facility] ORS;                Service: Gynecology;  Laterality: Right; 07/25/2021: LAPAROSCOPIC UNILATERAL SALPINGO OOPHERECTOMY; Right  Comment:  Procedure: LAPAROSCOPIC UNILATERAL SALPINGO               OOPHORECTOMY;  Surgeon: Nadara Mustard, MD;  Location:               [facility] ORS;  Service: Gynecology;  Laterality: Right; 04/07/2018: LEFT HEART CATH AND CORONARY ANGIOGRAPHY; Left     Comment:  Procedure: LEFT HEART CATH AND CORONARY ANGIOGRAPHY;                Surgeon: Alwyn Pea, MD;  Location: [facility] INVASIVE              CV LAB;  Service: Cardiovascular;  Laterality: Left; 07/2019: small bowel obstruction No date: TONSILLECTOMY 09/19/2016: URETEROSCOPY WITH HOLMIUM LASER LITHOTRIPSY; Right     Comment:  Procedure: URETEROSCOPY WITH HOLMIUM LASER LITHOTRIPSY;               Surgeon: Hildred Laser, MD;  Location: [facility] ORS;                Service: Urology;  Laterality: Right; 07/27/2021: XI ROBOTIC ASSISTED VENTRAL HERNIA; N/A     Comment:  Procedure: XI ROBOTIC ASSISTED INCARCERATED INCISIONAL               HERNIA REPAIR;  Surgeon: Campbell Lerner, MD;  Location:              [facility] ORS;   Service: General;  Laterality: N/A;   Reproductive/Obstetrics negative OB ROS                             Anesthesia Physical Anesthesia Plan  ASA: 3 and emergent  Anesthesia Plan: General ETT   Post-op Pain Management:    Induction:   PONV Risk Score and Plan: 4 or greater  Airway Management Planned:   Additional Equipment:   Intra-op Plan:   Post-operative Plan:   Informed Consent: I have reviewed the patients History and Physical, chart, labs and discussed the procedure including the risks, benefits and alternatives for the proposed anesthesia with the patient or authorized representative who has indicated his/her understanding and acceptance.     Dental Advisory Given  Plan Discussed with: CRNA  Anesthesia Plan Comments:        Anesthesia Quick Evaluation

## 2022-04-18 NOTE — H&P (View-Only) (Signed)
I, [name],acting as a scribe for [name], MD.,have documented all relevant documentation on the behalf of [name], MD,as directed by  [name], MD while in the presence of [name], MD.   04/18/22 3:18 PM   [place] 02/26/1986 [id number]  Referring provider: Sherre Scarlet, PA-C [place],  [place] [address]  Chief Complaint  Patient presents with   Nephrolithiasis    HPI: 36 year-old female who is referred for close follow-up after being seen in the ED earlier this week  with severe flank pain and 6 mm obstructing left ureteral stone.  In the ED, her pain was able to be controlled and she had no evidence of systemic infection, although her urine was suspicious. She's growing proteus and covered with Keflex. She has a large left lower pole non-obstructing stone along with a 6 mm left proximal ureteral stone with hydroureter nephrosis.   She states she had a fever at work today of 101.2 and took [employer].  Her temperature now is currently 99.2.  Overall, she feels very unwell.  Has been taking Keflex but despite this, continues to have pain, nausea, fevers and feels unwell.  She brought in a stone she passed prior to her CT scan.  She reports today that the stone looks different than her previous stones.   PMH: Past Medical History:  Diagnosis Date   Anemia    slight   Bradycardia    Depression    Diabetes mellitus without complication ([place])    resolved with weight loss from gastric bypass   Headache    migraines. resolved since gastric bypass   High cholesterol    resolved since weight loss surgery   History of kidney stones    last week   Hypertension    resolved since gastric bypass, severe pre-eclampsia with G1   Low BP    Meningitis 2015   PCOS (polycystic ovarian syndrome)    Preterm labor    Sleep apnea    had gastric bypass surgery and now resolved   Syncope    Vertigo couple years ago    Surgical  History: Past Surgical History:  Procedure Laterality Date   CESAREAN SECTION     CESAREAN SECTION N/A 05/07/2016   Procedure: CESAREAN SECTION;  Surgeon: Gae Dry, MD;  Location: [facility];  Service: Obstetrics;  Laterality: N/A;  Time of birth: [birth year] Sex: Female Weight: 3940 kg, 8 lb 11 oz   CHOLECYSTECTOMY     CYSTOSCOPY WITH STENT PLACEMENT Right 09/19/2016   Procedure: CYSTOSCOPY WITH STENT PLACEMENT;  Surgeon: Nickie Retort, MD;  Location: [facility];  Service: Urology;  Laterality: Right;   FLEXIBLE SIGMOIDOSCOPY N/A 07/28/2019   Procedure: FLEXIBLE SIGMOIDOSCOPY;  Surgeon: Lin Landsman, MD;  Location: [place];  Service: Endoscopy;  Laterality: N/A;   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   LAPAROSCOPIC OVARIAN CYSTECTOMY Right 09/28/2018   Procedure: LAPAROSCOPIC RIGHT OVARIAN CYSTECTOMY;  Surgeon: Gae Dry, MD;  Location: [facility];  Service: Gynecology;  Laterality: Right;   LAPAROSCOPIC UNILATERAL SALPINGO OOPHERECTOMY Right 07/25/2021   Procedure: LAPAROSCOPIC UNILATERAL SALPINGO OOPHORECTOMY;  Surgeon: Gae Dry, MD;  Location: [facility];  Service: Gynecology;  Laterality: Right;   LEFT HEART CATH AND CORONARY ANGIOGRAPHY Left 04/07/2018   Procedure: LEFT HEART CATH AND CORONARY ANGIOGRAPHY;  Surgeon: Yolonda Kida, MD;  Location: [facility];  Service: Cardiovascular;  Laterality: Left;  small bowel obstruction  07/2019   TONSILLECTOMY     URETEROSCOPY WITH HOLMIUM LASER LITHOTRIPSY Right 09/19/2016   Procedure: URETEROSCOPY WITH HOLMIUM LASER LITHOTRIPSY;  Surgeon: Nickie Retort, MD;  Location: [facility];  Service: Urology;  Laterality: Right;   XI ROBOTIC ASSISTED VENTRAL HERNIA N/A 07/27/2021   Procedure: XI ROBOTIC ASSISTED INCARCERATED INCISIONAL HERNIA REPAIR;  Surgeon: [name], MD;  Location: [facility];  Service: General;  Laterality: N/A;    Home Medications:  Allergies as of 04/18/2022        Reactions   Compazine [prochlorperazine] Other (See Comments)   Panic attack   Ceftin [cefuroxime Axetil] Rash   Tolerated cefazolin before   Penicillins Rash   TOLERATED CEFAZOLIN BEFORE Has patient had a PCN reaction causing immediate rash, facial/tongue/throat swelling, SOB or lightheadedness with hypotension: Yes Has patient had a PCN reaction causing severe rash involving mucus membranes or skin necrosis: No Has patient had a PCN reaction that required hospitalization No Has patient had a PCN reaction occurring within the last 10 years: No If all of the above answers are "NO", then may proceed with Cephalosporin use.        Medication List        Accurate as of April 18, 2022  3:18 PM. If you have any questions, ask your nurse or doctor.          buPROPion 150 MG 24 hr tablet Commonly known as: WELLBUTRIN XL Take 150 mg by mouth daily.   cephALEXin 500 MG capsule Commonly known as: KEFLEX Take 1 capsule (500 mg total) by mouth 2 (two) times daily for 7 days.   dicyclomine 20 MG tablet Commonly known as: BENTYL Take 20 mg by mouth every 6 (six) hours.   levonorgestrel 20 MCG/24HR IUD Commonly known as: MIRENA 1 each by Intrauterine route once.   ondansetron 4 MG disintegrating tablet Commonly known as: ZOFRAN-ODT Take 1 tablet (4 mg total) by mouth every 8 (eight) hours as needed for nausea or vomiting.   oxyCODONE-acetaminophen 5-325 MG tablet Commonly known as: Percocet Take 1 tablet by mouth every 4 (four) hours as needed for severe pain.   pantoprazole 40 MG tablet Commonly known as: PROTONIX Take 40 mg by mouth daily.   scopolamine 1 MG/3DAYS Commonly known as: TRANSDERM-SCOP Place 1 patch onto the skin every 3 (three) days.   sertraline 100 MG tablet Commonly known as: ZOLOFT Take 100 mg by mouth 2 (two) times daily.   tamsulosin 0.4 MG Caps capsule Commonly known as: FLOMAX Take 1 capsule (0.4 mg total) by mouth daily after supper.    traZODone 50 MG tablet Commonly known as: DESYREL Take 50 mg by mouth at bedtime.        Allergies:  Allergies  Allergen Reactions   Compazine [Prochlorperazine] Other (See Comments)    Panic attack   Ceftin [Cefuroxime Axetil] Rash    Tolerated cefazolin before   Penicillins Rash    TOLERATED CEFAZOLIN BEFORE Has patient had a PCN reaction causing immediate rash, facial/tongue/throat swelling, SOB or lightheadedness with hypotension: Yes Has patient had a PCN reaction causing severe rash involving mucus membranes or skin necrosis: No Has patient had a PCN reaction that required hospitalization No Has patient had a PCN reaction occurring within the last 10 years: No If all of the above answers are "NO", then may proceed with Cephalosporin use.    Family History: Family History  Problem Relation Age of Onset   Polycystic ovary syndrome Mother  Diabetes Father    Bladder Cancer Father    Depression Father    Endometriosis Sister    Polycystic ovary syndrome Sister    Breast cancer Paternal Grandmother    Diabetes Paternal Grandmother    Diabetes Paternal Grandfather    Kidney cancer Neg Hx    Prostate cancer Neg Hx     Social History:  reports that she has never smoked. She has never used smokeless tobacco. She reports that she does not drink alcohol and does not use drugs.   Physical Exam: BP 128/80 (BP Location: Left Arm, Patient Position: Sitting, Cuff Size: Large)   Pulse 80   Temp 99.2 F (37.3 C) (Oral)   Ht 6\' 1"  (1.854 m)   Wt 240 lb (108.9 kg)   BMI 31.66 kg/m   Constitutional:  Alert and oriented, No acute distress. HEENT: [place] AT, moist mucus membranes.  Trachea midline, no masses. Neurologic: Grossly intact, no focal deficits, moving all 4 extremities. Psychiatric: Normal mood and affect.  Laboratory Data: Lab Results  Component Value Date   WBC 7.6 04/16/2022   HGB 11.2 (L) 04/16/2022   HCT 34.5 (L) 04/16/2022   MCV 93.0 04/16/2022   PLT  314 04/16/2022    Lab Results  Component Value Date   CREATININE 0.55 04/16/2022    Lab Results  Component Value Date   HGBA1C 4.9 10/06/2015    Urinalysis Urinalysis from 1130 showed greater than 182 white blood cells, nitrate positive, growing Proteus with sensitivities pending.  Pertinent Imaging:  CT Renal Stone Study  Narrative CLINICAL DATA:  36 year old female with history of flank pain.  EXAM: CT ABDOMEN AND PELVIS WITHOUT CONTRAST  TECHNIQUE: Multidetector CT imaging of the abdomen and pelvis was performed following the standard protocol without IV contrast.  RADIATION DOSE REDUCTION: This exam was performed according to the departmental dose-optimization program which includes automated exposure control, adjustment of the mA and/or kV according to patient size and/or use of iterative reconstruction technique.  COMPARISON:  CT of the abdomen and pelvis 02/16/2022.  FINDINGS: Lower chest: Unremarkable.  Hepatobiliary: No definite suspicious cystic or solid hepatic lesions are confidently identified on today's noncontrast CT examination. Mild diffuse low attenuation throughout the hepatic parenchyma, indicative of a background of hepatic steatosis. Status post cholecystectomy.  Pancreas: No definite pancreatic mass or peripancreatic fluid collections or inflammatory changes are noted on today's noncontrast CT examination.  Spleen: Unremarkable.  Adrenals/Urinary Tract: Nonobstructive calculi are noted within the left renal collecting system measuring up to 2.2 x 1.2 cm in the lower pole. In addition, in the proximal third of the left ureter (coronal image 92 of series 5) there is a 6 mm calculus. This is associated with mild proximal left hydroureteronephrosis indicating obstruction. No additional calculi are noted in the right renal collecting system, along the course of the right ureter, or within the lumen of the urinary bladder. No right  hydroureteronephrosis. Unenhanced appearance of the right kidney, bilateral adrenal glands and urinary bladder is otherwise normal.  Stomach/Bowel: Postoperative changes of sleeve gastrectomy are noted. No pathologic dilatation of small bowel or colon. Postoperative changes in the right lower quadrant of the abdomen, likely from prior partial small bowel resection. Large appendicolith at the neck of the appendix. Appendix is otherwise unremarkable in appearance.  Vascular/Lymphatic: No atherosclerotic calcifications are noted in the abdominal aorta or pelvic vasculature. No lymphadenopathy noted in the abdomen or pelvis.  Reproductive: IUD present in the endometrial canal, which appears appropriately located. Unenhanced appearance  of the uterus and ovaries is otherwise unremarkable.  Other: No significant volume of ascites.  No pneumoperitoneum.  Musculoskeletal: There are no aggressive appearing lytic or blastic lesions noted in the visualized portions of the skeleton.  IMPRESSION: 1. In addition to large nonobstructive calculi in the lower pole collecting system of the left kidney which measure up to 2.2 x 1.2 cm, there is a 6 mm calculus in the proximal third of the left ureter with mild proximal left hydroureteronephrosis indicating acute obstruction. 2. Hepatic steatosis. 3. Postoperative changes and incidental findings, as above. 4. 350 hounsfield units in the stone to skin distance which is 12 cm.  Electronically Signed By: Trudie Reed M.D. On: 04/16/2022 07:32  Personally reviewed the CT today along with the patient.  Hounsfield units approximately 330, stone skin distance 12.  Assessment & Plan:    1.  Left proximal ureteral calculus with UTI/fevers concerning for evolving early sepsis We will proceed with emergent left ureteral stent in the setting of infection, fever, and failed outpatient management.   We discussed risk of the stent, including bleeding,  infection, and stent pain. She's had a stent in the past and is familiar with this. She eventually need staged procedure for her stone.   She was given another dose of 1 g IM ceftriaxone here.  We have facilitated for same-day procedure within the next few hours and will likely be admitted postop for supportive care.  We can treat her larger greater than 2 cm lower pole stone at the same time down the road.   I also personally discussed this plan with Dr. Annabell Howells who's agreeable with this plan.   She was given IM Ceftriaxone 1 gram here in the office. She did also pass a small stone before her CT and we will send for stone analysis.    I have reviewed the above documentation for accuracy and completeness, and I agree with the above.   Vanna Scotland, MD    [facility] [address], [address] [place], [place] [address] [phone]  I spent 62 total minutes on the day of the encounter including pre-visit review of the medical record, face-to-face time with the patient, and post visit ordering of labs/imaging/tests.  Majority of this time was spent coordinating care, discussing with on-call provider and facilitating direct procedure admission today.

## 2022-04-18 NOTE — H&P (View-Only) (Signed)
 I, Maria Fuller,acting as a scribe for Maria Bills, MD.,have documented all relevant documentation on the behalf of Maria Eleazer, MD,as directed by  Maria Meckel, MD while in the presence of Shahrzad Koble, MD.   04/18/22 3:18 PM   Maria Fuller 02/02/1986 [id number]  Referring provider: Powell, Mychal Kelly, PA-C [address] [place] [address] [place],  [place] [address]  Chief Complaint  Patient presents with   Nephrolithiasis    HPI: 36 year-old female who is referred for close follow-up after being seen in the ED earlier this week  with severe flank pain and 6 mm obstructing left ureteral stone.  In the ED, her pain was able to be controlled and she had no evidence of systemic infection, although her urine was suspicious. She's growing proteus and covered with Keflex. She has a large left lower pole non-obstructing stone along with a 6 mm left proximal ureteral stone with hydroureter nephrosis.   She states she had a fever at work today of 101.2 and took Tyenol.  Her temperature now is currently 99.2.  Overall, she feels very unwell.  Has been taking Keflex but despite this, continues to have pain, nausea, fevers and feels unwell.  She brought in a stone she passed prior to her CT scan.  She reports today that the stone looks different than her previous stones.   PMH: Past Medical History:  Diagnosis Date   Anemia    slight   Bradycardia    Depression    Diabetes mellitus without complication (HCC)    resolved with weight loss from gastric bypass   Headache    migraines. resolved since gastric bypass   High cholesterol    resolved since weight loss surgery   History of kidney stones    last week   Hypertension    resolved since gastric bypass, severe pre-eclampsia with G1   Low BP    Meningitis 2015   PCOS (polycystic ovarian syndrome)    Preterm labor    Sleep apnea    had gastric bypass surgery and now resolved   Syncope    Vertigo couple years ago    Surgical  History: Past Surgical History:  Procedure Laterality Date   CESAREAN SECTION     CESAREAN SECTION N/A 05/07/2016   Procedure: CESAREAN SECTION;  Surgeon: Robert P Harris, MD;  Location: [facility];  Service: Obstetrics;  Laterality: N/A;  Time of birth: [birth year] Sex: Female Weight: 3940 kg, 8 lb 11 oz   CHOLECYSTECTOMY     CYSTOSCOPY WITH STENT PLACEMENT Right 09/19/2016   Procedure: CYSTOSCOPY WITH STENT PLACEMENT;  Surgeon: Budzyn, Brian James, MD;  Location: [facility];  Service: Urology;  Laterality: Right;   FLEXIBLE SIGMOIDOSCOPY N/A 07/28/2019   Procedure: FLEXIBLE SIGMOIDOSCOPY;  Surgeon: Vanga, Rohini Reddy, MD;  Location: [facility];  Service: Endoscopy;  Laterality: N/A;   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   LAPAROSCOPIC OVARIAN CYSTECTOMY Right 09/28/2018   Procedure: LAPAROSCOPIC RIGHT OVARIAN CYSTECTOMY;  Surgeon: Harris, Robert P, MD;  Location: [facility];  Service: Gynecology;  Laterality: Right;   LAPAROSCOPIC UNILATERAL SALPINGO OOPHERECTOMY Right 07/25/2021   Procedure: LAPAROSCOPIC UNILATERAL SALPINGO OOPHORECTOMY;  Surgeon: Harris, Robert P, MD;  Location: [facility];  Service: Gynecology;  Laterality: Right;   LEFT HEART CATH AND CORONARY ANGIOGRAPHY Left 04/07/2018   Procedure: LEFT HEART CATH AND CORONARY ANGIOGRAPHY;  Surgeon: Callwood, Dwayne D, MD;  Location: [facility];  Service: Cardiovascular;  Laterality: Left;     small bowel obstruction  07/2019   TONSILLECTOMY     URETEROSCOPY WITH HOLMIUM LASER LITHOTRIPSY Right 09/19/2016   Procedure: URETEROSCOPY WITH HOLMIUM LASER LITHOTRIPSY;  Surgeon: Budzyn, Brian James, MD;  Location: [facility];  Service: Urology;  Laterality: Right;   XI ROBOTIC ASSISTED VENTRAL HERNIA N/A 07/27/2021   Procedure: XI ROBOTIC ASSISTED INCARCERATED INCISIONAL HERNIA REPAIR;  Surgeon: Rodenberg, Denny, MD;  Location: [facility];  Service: General;  Laterality: N/A;    Home Medications:  Allergies as of 04/18/2022        Reactions   Compazine [prochlorperazine] Other (See Comments)   Panic attack   Ceftin [cefuroxime Axetil] Rash   Tolerated cefazolin before   Penicillins Rash   TOLERATED CEFAZOLIN BEFORE Has patient had a PCN reaction causing immediate rash, facial/tongue/throat swelling, SOB or lightheadedness with hypotension: Yes Has patient had a PCN reaction causing severe rash involving mucus membranes or skin necrosis: No Has patient had a PCN reaction that required hospitalization No Has patient had a PCN reaction occurring within the last 10 years: No If all of the above answers are "NO", then may proceed with Cephalosporin use.        Medication List        Accurate as of April 18, 2022  3:18 PM. If you have any questions, ask your nurse or doctor.          buPROPion 150 MG 24 hr tablet Commonly known as: WELLBUTRIN XL Take 150 mg by mouth daily.   cephALEXin 500 MG capsule Commonly known as: KEFLEX Take 1 capsule (500 mg total) by mouth 2 (two) times daily for 7 days.   dicyclomine 20 MG tablet Commonly known as: BENTYL Take 20 mg by mouth every 6 (six) hours.   levonorgestrel 20 MCG/24HR IUD Commonly known as: MIRENA 1 each by Intrauterine route once.   ondansetron 4 MG disintegrating tablet Commonly known as: ZOFRAN-ODT Take 1 tablet (4 mg total) by mouth every 8 (eight) hours as needed for nausea or vomiting.   oxyCODONE-acetaminophen 5-325 MG tablet Commonly known as: Percocet Take 1 tablet by mouth every 4 (four) hours as needed for severe pain.   pantoprazole 40 MG tablet Commonly known as: PROTONIX Take 40 mg by mouth daily.   scopolamine 1 MG/3DAYS Commonly known as: TRANSDERM-SCOP Place 1 patch onto the skin every 3 (three) days.   sertraline 100 MG tablet Commonly known as: ZOLOFT Take 100 mg by mouth 2 (two) times daily.   tamsulosin 0.4 MG Caps capsule Commonly known as: FLOMAX Take 1 capsule (0.4 mg total) by mouth daily after supper.    traZODone 50 MG tablet Commonly known as: DESYREL Take 50 mg by mouth at bedtime.        Allergies:  Allergies  Allergen Reactions   Compazine [Prochlorperazine] Other (See Comments)    Panic attack   Ceftin [Cefuroxime Axetil] Rash    Tolerated cefazolin before   Penicillins Rash    TOLERATED CEFAZOLIN BEFORE Has patient had a PCN reaction causing immediate rash, facial/tongue/throat swelling, SOB or lightheadedness with hypotension: Yes Has patient had a PCN reaction causing severe rash involving mucus membranes or skin necrosis: No Has patient had a PCN reaction that required hospitalization No Has patient had a PCN reaction occurring within the last 10 years: No If all of the above answers are "NO", then may proceed with Cephalosporin use.    Family History: Family History  Problem Relation Age of Onset   Polycystic ovary syndrome Mother      Diabetes Father    Bladder Cancer Father    Depression Father    Endometriosis Sister    Polycystic ovary syndrome Sister    Breast cancer Paternal Grandmother    Diabetes Paternal Grandmother    Diabetes Paternal Grandfather    Kidney cancer Neg Hx    Prostate cancer Neg Hx     Social History:  reports that she has never smoked. She has never used smokeless tobacco. She reports that she does not drink alcohol and does not use drugs.   Physical Exam: BP 128/80 (BP Location: Left Arm, Patient Position: Sitting, Cuff Size: Large)   Pulse 80   Temp 99.2 F (37.3 C) (Oral)   Ht 6' 1" (1.854 m)   Wt 240 lb (108.9 kg)   BMI 31.66 kg/m   Constitutional:  Alert and oriented, No acute distress. HEENT: [place] AT, moist mucus membranes.  Trachea midline, no masses. Neurologic: Grossly intact, no focal deficits, moving all 4 extremities. Psychiatric: Normal mood and affect.  Laboratory Data: Lab Results  Component Value Date   WBC 7.6 04/16/2022   HGB 11.2 (L) 04/16/2022   HCT 34.5 (L) 04/16/2022   MCV 93.0 04/16/2022   PLT  314 04/16/2022    Lab Results  Component Value Date   CREATININE 0.55 04/16/2022    Lab Results  Component Value Date   HGBA1C 4.9 10/06/2015    Urinalysis Urinalysis from 1130 showed greater than 182 white blood cells, nitrate positive, growing Proteus with sensitivities pending.  Pertinent Imaging:  CT Renal Stone Study  Narrative CLINICAL DATA:  36-year-old female with history of flank pain.  EXAM: CT ABDOMEN AND PELVIS WITHOUT CONTRAST  TECHNIQUE: Multidetector CT imaging of the abdomen and pelvis was performed following the standard protocol without IV contrast.  RADIATION DOSE REDUCTION: This exam was performed according to the departmental dose-optimization program which includes automated exposure control, adjustment of the mA and/or kV according to patient size and/or use of iterative reconstruction technique.  COMPARISON:  CT of the abdomen and pelvis 02/16/2022.  FINDINGS: Lower chest: Unremarkable.  Hepatobiliary: No definite suspicious cystic or solid hepatic lesions are confidently identified on today's noncontrast CT examination. Mild diffuse low attenuation throughout the hepatic parenchyma, indicative of a background of hepatic steatosis. Status post cholecystectomy.  Pancreas: No definite pancreatic mass or peripancreatic fluid collections or inflammatory changes are noted on today's noncontrast CT examination.  Spleen: Unremarkable.  Adrenals/Urinary Tract: Nonobstructive calculi are noted within the left renal collecting system measuring up to 2.2 x 1.2 cm in the lower pole. In addition, in the proximal third of the left ureter (coronal image 92 of series 5) there is a 6 mm calculus. This is associated with mild proximal left hydroureteronephrosis indicating obstruction. No additional calculi are noted in the right renal collecting system, along the course of the right ureter, or within the lumen of the urinary bladder. No right  hydroureteronephrosis. Unenhanced appearance of the right kidney, bilateral adrenal glands and urinary bladder is otherwise normal.  Stomach/Bowel: Postoperative changes of sleeve gastrectomy are noted. No pathologic dilatation of small bowel or colon. Postoperative changes in the right lower quadrant of the abdomen, likely from prior partial small bowel resection. Large appendicolith at the neck of the appendix. Appendix is otherwise unremarkable in appearance.  Vascular/Lymphatic: No atherosclerotic calcifications are noted in the abdominal aorta or pelvic vasculature. No lymphadenopathy noted in the abdomen or pelvis.  Reproductive: IUD present in the endometrial canal, which appears appropriately located. Unenhanced appearance   of the uterus and ovaries is otherwise unremarkable.  Other: No significant volume of ascites.  No pneumoperitoneum.  Musculoskeletal: There are no aggressive appearing lytic or blastic lesions noted in the visualized portions of the skeleton.  IMPRESSION: 1. In addition to large nonobstructive calculi in the lower pole collecting system of the left kidney which measure up to 2.2 x 1.2 cm, there is a 6 mm calculus in the proximal third of the left ureter with mild proximal left hydroureteronephrosis indicating acute obstruction. 2. Hepatic steatosis. 3. Postoperative changes and incidental findings, as above. 4. 350 hounsfield units in the stone to skin distance which is 12 cm.  Electronically Signed By: Trudie Reed M.D. On: 04/16/2022 07:32  Personally reviewed the CT today along with the patient.  Hounsfield units approximately 330, stone skin distance 12.  Assessment & Plan:    1.  Left proximal ureteral calculus with UTI/fevers concerning for evolving early sepsis We will proceed with emergent left ureteral stent in the setting of infection, fever, and failed outpatient management.   We discussed risk of the stent, including bleeding,  infection, and stent pain. She's had a stent in the past and is familiar with this. She eventually need staged procedure for her stone.   She was given another dose of 1 g IM ceftriaxone here.  We have facilitated for same-day procedure within the next few hours and will likely be admitted postop for supportive care.  We can treat her larger greater than 2 cm lower pole stone at the same time down the road.   I also personally discussed this plan with Dr. Annabell Howells who's agreeable with this plan.   She was given IM Ceftriaxone 1 gram here in the office. She did also pass a small stone before her CT and we will send for stone analysis.    I have reviewed the above documentation for accuracy and completeness, and I agree with the above.   Vanna Scotland, MD    [facility] [address], [address] [place], [place] [address] [phone]  I spent 62 total minutes on the day of the encounter including pre-visit review of the medical record, face-to-face time with the patient, and post visit ordering of labs/imaging/tests.  Majority of this time was spent coordinating care, discussing with on-call provider and facilitating direct procedure admission today.

## 2022-04-18 NOTE — Progress Notes (Addendum)
 I, [name],acting as a scribe for Janisha Bueso, MD.,have documented all relevant documentation on the behalf of [name], MD,as directed by  Tiffani Kadow, MD while in the presence of Yancarlos Berthold, MD.   04/18/22 3:18 PM   Maria Fuller 01/27/1986 [id number]  Referring provider: Powell, Mychal Kelly, PA-C [address] [place] [address] [place],  [place] [address]  Chief Complaint  Patient presents with   Nephrolithiasis    HPI: 36 year-old female who is referred for close follow-up after being seen in the ED earlier this week  with severe flank pain and 6 mm obstructing left ureteral stone.  In the ED, her pain was able to be controlled and she had no evidence of systemic infection, although her urine was suspicious. She's growing proteus and covered with Keflex. She has a large left lower pole non-obstructing stone along with a 6 mm left proximal ureteral stone with hydroureter nephrosis.   She states she had a fever at work today of 101.2 and took Tyenol.  Her temperature now is currently 99.2.  Overall, she feels very unwell.  Has been taking Keflex but despite this, continues to have pain, nausea, fevers and feels unwell.  She brought in a stone she passed prior to her CT scan.  She reports today that the stone looks different than her previous stones.   PMH: Past Medical History:  Diagnosis Date   Anemia    slight   Bradycardia    Depression    Diabetes mellitus without complication (HCC)    resolved with weight loss from gastric bypass   Headache    migraines. resolved since gastric bypass   High cholesterol    resolved since weight loss surgery   History of kidney stones    last week   Hypertension    resolved since gastric bypass, severe pre-eclampsia with G1   Low BP    Meningitis 2015   PCOS (polycystic ovarian syndrome)    Preterm labor    Sleep apnea    had gastric bypass surgery and now resolved   Syncope    Vertigo couple years ago    Surgical  History: Past Surgical History:  Procedure Laterality Date   CESAREAN SECTION     CESAREAN SECTION N/A 05/07/2016   Procedure: CESAREAN SECTION;  Surgeon: Robert P Harris, MD;  Location: [facility];  Service: Obstetrics;  Laterality: N/A;  Time of birth: [birth year] Sex: Female Weight: 3940 kg, 8 lb 11 oz   CHOLECYSTECTOMY     CYSTOSCOPY WITH STENT PLACEMENT Right 09/19/2016   Procedure: CYSTOSCOPY WITH STENT PLACEMENT;  Surgeon: Budzyn, Brian James, MD;  Location: [facility];  Service: Urology;  Laterality: Right;   FLEXIBLE SIGMOIDOSCOPY N/A 07/28/2019   Procedure: FLEXIBLE SIGMOIDOSCOPY;  Surgeon: Vanga, Rohini Reddy, MD;  Location: [facility];  Service: Endoscopy;  Laterality: N/A;   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   LAPAROSCOPIC OVARIAN CYSTECTOMY Right 09/28/2018   Procedure: LAPAROSCOPIC RIGHT OVARIAN CYSTECTOMY;  Surgeon: Harris, Robert P, MD;  Location: [facility];  Service: Gynecology;  Laterality: Right;   LAPAROSCOPIC UNILATERAL SALPINGO OOPHERECTOMY Right 07/25/2021   Procedure: LAPAROSCOPIC UNILATERAL SALPINGO OOPHORECTOMY;  Surgeon: Harris, Robert P, MD;  Location: [facility];  Service: Gynecology;  Laterality: Right;   LEFT HEART CATH AND CORONARY ANGIOGRAPHY Left 04/07/2018   Procedure: LEFT HEART CATH AND CORONARY ANGIOGRAPHY;  Surgeon: Callwood, Dwayne D, MD;  Location: [facility];  Service: Cardiovascular;  Laterality: Left;     small bowel obstruction  07/2019   TONSILLECTOMY     URETEROSCOPY WITH HOLMIUM LASER LITHOTRIPSY Right 09/19/2016   Procedure: URETEROSCOPY WITH HOLMIUM LASER LITHOTRIPSY;  Surgeon: Budzyn, Brian James, MD;  Location: [facility];  Service: Urology;  Laterality: Right;   XI ROBOTIC ASSISTED VENTRAL HERNIA N/A 07/27/2021   Procedure: XI ROBOTIC ASSISTED INCARCERATED INCISIONAL HERNIA REPAIR;  Surgeon: Rodenberg, Denny, MD;  Location: [facility];  Service: General;  Laterality: N/A;    Home Medications:  Allergies as of 04/18/2022        Reactions   Compazine [prochlorperazine] Other (See Comments)   Panic attack   Ceftin [cefuroxime Axetil] Rash   Tolerated cefazolin before   Penicillins Rash   TOLERATED CEFAZOLIN BEFORE Has patient had a PCN reaction causing immediate rash, facial/tongue/throat swelling, SOB or lightheadedness with hypotension: Yes Has patient had a PCN reaction causing severe rash involving mucus membranes or skin necrosis: No Has patient had a PCN reaction that required hospitalization No Has patient had a PCN reaction occurring within the last 10 years: No If all of the above answers are "NO", then may proceed with Cephalosporin use.        Medication List        Accurate as of April 18, 2022  3:18 PM. If you have any questions, ask your nurse or doctor.          buPROPion 150 MG 24 hr tablet Commonly known as: WELLBUTRIN XL Take 150 mg by mouth daily.   cephALEXin 500 MG capsule Commonly known as: KEFLEX Take 1 capsule (500 mg total) by mouth 2 (two) times daily for 7 days.   dicyclomine 20 MG tablet Commonly known as: BENTYL Take 20 mg by mouth every 6 (six) hours.   levonorgestrel 20 MCG/24HR IUD Commonly known as: MIRENA 1 each by Intrauterine route once.   ondansetron 4 MG disintegrating tablet Commonly known as: ZOFRAN-ODT Take 1 tablet (4 mg total) by mouth every 8 (eight) hours as needed for nausea or vomiting.   oxyCODONE-acetaminophen 5-325 MG tablet Commonly known as: Percocet Take 1 tablet by mouth every 4 (four) hours as needed for severe pain.   pantoprazole 40 MG tablet Commonly known as: PROTONIX Take 40 mg by mouth daily.   scopolamine 1 MG/3DAYS Commonly known as: TRANSDERM-SCOP Place 1 patch onto the skin every 3 (three) days.   sertraline 100 MG tablet Commonly known as: ZOLOFT Take 100 mg by mouth 2 (two) times daily.   tamsulosin 0.4 MG Caps capsule Commonly known as: FLOMAX Take 1 capsule (0.4 mg total) by mouth daily after supper.    traZODone 50 MG tablet Commonly known as: DESYREL Take 50 mg by mouth at bedtime.        Allergies:  Allergies  Allergen Reactions   Compazine [Prochlorperazine] Other (See Comments)    Panic attack   Ceftin [Cefuroxime Axetil] Rash    Tolerated cefazolin before   Penicillins Rash    TOLERATED CEFAZOLIN BEFORE Has patient had a PCN reaction causing immediate rash, facial/tongue/throat swelling, SOB or lightheadedness with hypotension: Yes Has patient had a PCN reaction causing severe rash involving mucus membranes or skin necrosis: No Has patient had a PCN reaction that required hospitalization No Has patient had a PCN reaction occurring within the last 10 years: No If all of the above answers are "NO", then may proceed with Cephalosporin use.    Family History: Family History  Problem Relation Age of Onset   Polycystic ovary syndrome Mother      Diabetes Father    Bladder Cancer Father    Depression Father    Endometriosis Sister    Polycystic ovary syndrome Sister    Breast cancer Paternal Grandmother    Diabetes Paternal Grandmother    Diabetes Paternal Grandfather    Kidney cancer Neg Hx    Prostate cancer Neg Hx     Social History:  reports that she has never smoked. She has never used smokeless tobacco. She reports that she does not drink alcohol and does not use drugs.   Physical Exam: BP 128/80 (BP Location: Left Arm, Patient Position: Sitting, Cuff Size: Large)   Pulse 80   Temp 99.2 F (37.3 C) (Oral)   Ht 6' 1" (1.854 m)   Wt 240 lb (108.9 kg)   BMI 31.66 kg/m   Constitutional:  Alert and oriented, No acute distress. HEENT: [place] AT, moist mucus membranes.  Trachea midline, no masses. Neurologic: Grossly intact, no focal deficits, moving all 4 extremities. Psychiatric: Normal mood and affect.  Laboratory Data: Lab Results  Component Value Date   WBC 7.6 04/16/2022   HGB 11.2 (L) 04/16/2022   HCT 34.5 (L) 04/16/2022   MCV 93.0 04/16/2022   PLT  314 04/16/2022    Lab Results  Component Value Date   CREATININE 0.55 04/16/2022    Lab Results  Component Value Date   HGBA1C 4.9 10/06/2015    Urinalysis Urinalysis from 1130 showed greater than 182 white blood cells, nitrate positive, growing Proteus with sensitivities pending.  Pertinent Imaging:  CT Renal Stone Study  Narrative CLINICAL DATA:  36-year-old female with history of flank pain.  EXAM: CT ABDOMEN AND PELVIS WITHOUT CONTRAST  TECHNIQUE: Multidetector CT imaging of the abdomen and pelvis was performed following the standard protocol without IV contrast.  RADIATION DOSE REDUCTION: This exam was performed according to the departmental dose-optimization program which includes automated exposure control, adjustment of the mA and/or kV according to patient size and/or use of iterative reconstruction technique.  COMPARISON:  CT of the abdomen and pelvis 02/16/2022.  FINDINGS: Lower chest: Unremarkable.  Hepatobiliary: No definite suspicious cystic or solid hepatic lesions are confidently identified on today's noncontrast CT examination. Mild diffuse low attenuation throughout the hepatic parenchyma, indicative of a background of hepatic steatosis. Status post cholecystectomy.  Pancreas: No definite pancreatic mass or peripancreatic fluid collections or inflammatory changes are noted on today's noncontrast CT examination.  Spleen: Unremarkable.  Adrenals/Urinary Tract: Nonobstructive calculi are noted within the left renal collecting system measuring up to 2.2 x 1.2 cm in the lower pole. In addition, in the proximal third of the left ureter (coronal image 92 of series 5) there is a 6 mm calculus. This is associated with mild proximal left hydroureteronephrosis indicating obstruction. No additional calculi are noted in the right renal collecting system, along the course of the right ureter, or within the lumen of the urinary bladder. No right  hydroureteronephrosis. Unenhanced appearance of the right kidney, bilateral adrenal glands and urinary bladder is otherwise normal.  Stomach/Bowel: Postoperative changes of sleeve gastrectomy are noted. No pathologic dilatation of small bowel or colon. Postoperative changes in the right lower quadrant of the abdomen, likely from prior partial small bowel resection. Large appendicolith at the neck of the appendix. Appendix is otherwise unremarkable in appearance.  Vascular/Lymphatic: No atherosclerotic calcifications are noted in the abdominal aorta or pelvic vasculature. No lymphadenopathy noted in the abdomen or pelvis.  Reproductive: IUD present in the endometrial canal, which appears appropriately located. Unenhanced appearance   of the uterus and ovaries is otherwise unremarkable.  Other: No significant volume of ascites.  No pneumoperitoneum.  Musculoskeletal: There are no aggressive appearing lytic or blastic lesions noted in the visualized portions of the skeleton.  IMPRESSION: 1. In addition to large nonobstructive calculi in the lower pole collecting system of the left kidney which measure up to 2.2 x 1.2 cm, there is a 6 mm calculus in the proximal third of the left ureter with mild proximal left hydroureteronephrosis indicating acute obstruction. 2. Hepatic steatosis. 3. Postoperative changes and incidental findings, as above. 4. 350 hounsfield units in the stone to skin distance which is 12 cm.  Electronically Signed By: Trudie Reed M.D. On: 04/16/2022 07:32  Personally reviewed the CT today along with the patient.  Hounsfield units approximately 330, stone skin distance 12.  Assessment & Plan:    1.  Left proximal ureteral calculus with UTI/fevers concerning for evolving early sepsis We will proceed with emergent left ureteral stent in the setting of infection, fever, and failed outpatient management.   We discussed risk of the stent, including bleeding,  infection, and stent pain. She's had a stent in the past and is familiar with this. She eventually need staged procedure for her stone.   She was given another dose of 1 g IM ceftriaxone here.  We have facilitated for same-day procedure within the next few hours and will likely be admitted postop for supportive care.  We can treat her larger greater than 2 cm lower pole stone at the same time down the road.   I also personally discussed this plan with Dr. Annabell Howells who's agreeable with this plan.   She was given IM Ceftriaxone 1 gram here in the office. She did also pass a small stone before her CT and we will send for stone analysis.    I have reviewed the above documentation for accuracy and completeness, and I agree with the above.   Vanna Scotland, MD    [facility] [address], [address] [place], [place] [address] [phone]  I spent 62 total minutes on the day of the encounter including pre-visit review of the medical record, face-to-face time with the patient, and post visit ordering of labs/imaging/tests.  Majority of this time was spent coordinating care, discussing with on-call provider and facilitating direct procedure admission today.

## 2022-04-18 NOTE — Interval H&P Note (Signed)
History and Physical Interval Note: I have met with Maria Fuller, reveiwed her chart and reviewed the surgical risks again and explained the need to monitor her overnight because of the infection.   04/18/2022 5:18 PM  Maria Fuller  has presented today for surgery, with the diagnosis of Left Ureteral Stone, complicated UTI.  The various methods of treatment have been discussed with the patient and family. After consideration of risks, benefits and other options for treatment, the patient has consented to  Procedure(s): [place] (Left) as a surgical intervention.  The patient's history has been reviewed, patient examined, no change in status, stable for surgery.  I have reviewed the patient's chart and labs.  Questions were answered to the patient's satisfaction.     Irine Seal

## 2022-04-19 DIAGNOSIS — R001 Bradycardia, unspecified: Secondary | ICD-10-CM | POA: Diagnosis not present

## 2022-04-19 DIAGNOSIS — N202 Calculus of kidney with calculus of ureter: Secondary | ICD-10-CM | POA: Diagnosis not present

## 2022-04-19 LAB — URINE CULTURE: Culture: >=100000 — AB

## 2022-04-19 NOTE — Discharge Summary (Signed)
Physician Discharge Summary  Patient ID: Maria Fuller MRN: [id number] DOB/AGE: Oct 26, 1985 36 y.o.  Admit date: 04/18/2022 Discharge date: 04/19/2022  Admission Diagnoses:  Left ureteral stone  Discharge Diagnoses:  Principal Problem:   Left ureteral stone Active Problems:   Left renal stone   Febrile urinary tract infection   Bradycardia   Past Medical History:  Diagnosis Date   Anemia    slight   Bradycardia    Depression    Diabetes mellitus without complication (HCC)    resolved with weight loss from gastric bypass   Headache    migraines. resolved since gastric bypass   High cholesterol    resolved since weight loss surgery   History of kidney stones    last week   Hypertension    resolved since gastric bypass, severe pre-eclampsia with G1   Low BP    Meningitis 2015   PCOS (polycystic ovarian syndrome)    Preterm labor    Sleep apnea    had gastric bypass surgery and now resolved   Syncope    Vertigo couple years ago    Surgeries: Procedure(s): CYSTOSCOPY WITH STENT PLACEMENT on 04/18/2022   Consultants (if any):   Discharged Condition: Improved  Hospital Course: Maria Fuller is an 36 y.o. female who was admitted 04/18/2022 with a diagnosis of Left ureteral stone and went to the operating room on 04/18/2022 and underwent the above named procedures.  She has done well postoperatively with resolution of her pain and fever but she does have some bladder spasms.  She has asymptomatic bradycardia but that is a recurring condition that she is aware of and needs no acute evaluation.   He has levsin elixer on hand at home for the bladder spasms.   She was given perioperative antibiotics:  Anti-infectives (From admission, onward)    Start     Dose/Rate Route Frequency Ordered Stop   04/19/22 1200  cefTRIAXone (ROCEPHIN) 1 g in sodium chloride 0.9 % 100 mL IVPB       Note to Pharmacy: She had been on Keflex and got rocephin in the office today without  reaction.   1 g 200 mL/hr over 30 Minutes Intravenous Every 24 hours 04/18/22 2046 04/26/22 1159     .  She was given sequential compression devices, early ambulation, and Lovenox for DVT prophylaxis.  She benefited maximally from the hospital stay and there were no complications.    Recent vital signs:  Vitals:   04/19/22 0741 04/19/22 0816  BP: 110/61 111/61  Pulse: (!) 45 (!) 48  Resp: 10 12  Temp: 97.7 F (36.5 C) 97.8 F (36.6 C)  SpO2: 97% 100%    Recent laboratory studies:  Lab Results  Component Value Date   HGB 10.8 (L) 04/18/2022   HGB 11.2 (L) 04/16/2022   HGB 12.1 02/16/2022   Lab Results  Component Value Date   WBC 6.5 04/18/2022   PLT 241 04/18/2022   No results found for: "INR" Lab Results  Component Value Date   NA 140 04/16/2022   K 3.8 04/16/2022   CL 111 04/16/2022   CO2 24 04/16/2022   BUN 11 04/16/2022   CREATININE 0.62 04/18/2022   GLUCOSE 90 04/16/2022    Discharge Medications:   Allergies as of 04/19/2022       Reactions   Compazine [prochlorperazine] Other (See Comments)   Panic attack   Ceftin [cefuroxime Axetil] Rash   Tolerated cefazolin before   Penicillins Rash  TOLERATED CEFAZOLIN BEFORE Has patient had a PCN reaction causing immediate rash, facial/tongue/throat swelling, SOB or lightheadedness with hypotension: Yes Has patient had a PCN reaction causing severe rash involving mucus membranes or skin necrosis: No Has patient had a PCN reaction that required hospitalization No Has patient had a PCN reaction occurring within the last 10 years: No If all of the above answers are "NO", then may proceed with Cephalosporin use.        Medication List     TAKE these medications    buPROPion 150 MG 24 hr tablet Commonly known as: WELLBUTRIN XL Take 150 mg by mouth daily.   cephALEXin 500 MG capsule Commonly known as: KEFLEX Take 1 capsule (500 mg total) by mouth 2 (two) times daily for 7 days.   dicyclomine 20 MG  tablet Commonly known as: BENTYL Take 20 mg by mouth every 6 (six) hours.   levonorgestrel 20 MCG/24HR IUD Commonly known as: MIRENA 1 each by Intrauterine route once.   ondansetron 4 MG disintegrating tablet Commonly known as: ZOFRAN-ODT Take 1 tablet (4 mg total) by mouth every 8 (eight) hours as needed for nausea or vomiting.   oxyCODONE-acetaminophen 5-325 MG tablet Commonly known as: Percocet Take 1 tablet by mouth every 4 (four) hours as needed for severe pain.   pantoprazole 40 MG tablet Commonly known as: PROTONIX Take 40 mg by mouth daily.   scopolamine 1 MG/3DAYS Commonly known as: TRANSDERM-SCOP Place 1 patch onto the skin every 3 (three) days.   sertraline 100 MG tablet Commonly known as: ZOLOFT Take 100 mg by mouth 2 (two) times daily.   tamsulosin 0.4 MG Caps capsule Commonly known as: FLOMAX Take 1 capsule (0.4 mg total) by mouth daily after supper.   traZODone 50 MG tablet Commonly known as: DESYREL Take 50 mg by mouth at bedtime.        Diagnostic Studies: DG OR UROLOGY CYSTO IMAGE ([facility] ONLY)  Result Date: 04/18/2022 There is no interpretation for this exam.  This order is for images obtained during a surgical procedure.  Please See "Surgeries" Tab for more information regarding the procedure.   CT Renal Stone Study  Result Date: 04/16/2022 CLINICAL DATA:  36 year old female with history of flank pain. EXAM: CT ABDOMEN AND PELVIS WITHOUT CONTRAST TECHNIQUE: Multidetector CT imaging of the abdomen and pelvis was performed following the standard protocol without IV contrast. RADIATION DOSE REDUCTION: This exam was performed according to the departmental dose-optimization program which includes automated exposure control, adjustment of the mA and/or kV according to patient size and/or use of iterative reconstruction technique. COMPARISON:  CT of the abdomen and pelvis 02/16/2022. FINDINGS: Lower chest: Unremarkable. Hepatobiliary: No definite suspicious  cystic or solid hepatic lesions are confidently identified on today's noncontrast CT examination. Mild diffuse low attenuation throughout the hepatic parenchyma, indicative of a background of hepatic steatosis. Status post cholecystectomy. Pancreas: No definite pancreatic mass or peripancreatic fluid collections or inflammatory changes are noted on today's noncontrast CT examination. Spleen: Unremarkable. Adrenals/Urinary Tract: Nonobstructive calculi are noted within the left renal collecting system measuring up to 2.2 x 1.2 cm in the lower pole. In addition, in the proximal third of the left ureter (coronal image 92 of series 5) there is a 6 mm calculus. This is associated with mild proximal left hydroureteronephrosis indicating obstruction. No additional calculi are noted in the right renal collecting system, along the course of the right ureter, or within the lumen of the urinary bladder. No right hydroureteronephrosis. Unenhanced appearance  of the right kidney, bilateral adrenal glands and urinary bladder is otherwise normal. Stomach/Bowel: Postoperative changes of sleeve gastrectomy are noted. No pathologic dilatation of small bowel or colon. Postoperative changes in the right lower quadrant of the abdomen, likely from prior partial small bowel resection. Large appendicolith at the neck of the appendix. Appendix is otherwise unremarkable in appearance. Vascular/Lymphatic: No atherosclerotic calcifications are noted in the abdominal aorta or pelvic vasculature. No lymphadenopathy noted in the abdomen or pelvis. Reproductive: IUD present in the endometrial canal, which appears appropriately located. Unenhanced appearance of the uterus and ovaries is otherwise unremarkable. Other: No significant volume of ascites.  No pneumoperitoneum. Musculoskeletal: There are no aggressive appearing lytic or blastic lesions noted in the visualized portions of the skeleton. IMPRESSION: 1. In addition to large nonobstructive  calculi in the lower pole collecting system of the left kidney which measure up to 2.2 x 1.2 cm, there is a 6 mm calculus in the proximal third of the left ureter with mild proximal left hydroureteronephrosis indicating acute obstruction. 2. Hepatic steatosis. 3. Postoperative changes and incidental findings, as above. Electronically Signed   By: Vinnie Langton M.D.   On: 04/16/2022 07:32    Disposition: Discharge disposition: 01-Home or Self Care       Discharge Instructions     Discharge patient   Complete by: As directed    Discharge disposition: 01-Home or Self Care   Discharge patient date: 04/19/2022   Discontinue IV   Complete by: As directed         Follow-up Information     [name], MD Follow up.   Specialty: Urology Why: Please contact the office to arrange a f/u appointment in the next 1-2 weeks to discuss next steps. Contact information: [place] [address] [address] [address] [phone]                  Signed: Irine Seal 04/19/2022, 9:29 AM

## 2022-04-20 NOTE — Anesthesia Postprocedure Evaluation (Signed)
Anesthesia Post Note  Patient: Maria Fuller  Procedure(s) Performed: CYSTOSCOPY WITH STENT PLACEMENT (Left)  Patient location during evaluation: PACU Anesthesia Type: General Level of consciousness: awake and alert Pain management: pain level controlled Vital Signs Assessment: post-procedure vital signs reviewed and stable Respiratory status: spontaneous breathing, nonlabored ventilation, respiratory function stable and patient connected to nasal cannula oxygen Cardiovascular status: blood pressure returned to baseline and stable Postop Assessment: no apparent nausea or vomiting Anesthetic complications: no   No notable events documented.   Last Vitals:  Vitals:   04/19/22 0741 04/19/22 0816  BP: 110/61 111/61  Pulse: (!) 45 (!) 48  Resp: 10 12  Temp: 36.5 C 36.6 C  SpO2: 97% 100%    Last Pain:  Vitals:   04/19/22 0558  TempSrc:   PainSc: Asleep                 Yevette Edwards

## 2022-04-21 ENCOUNTER — Other Ambulatory Visit: Payer: Self-pay

## 2022-04-21 ENCOUNTER — Telehealth: Payer: Self-pay

## 2022-04-21 ENCOUNTER — Encounter: Payer: Self-pay | Admitting: Urology

## 2022-04-21 ENCOUNTER — Other Ambulatory Visit: Payer: Self-pay | Admitting: Urology

## 2022-04-21 DIAGNOSIS — N200 Calculus of kidney: Secondary | ICD-10-CM

## 2022-04-21 MED ORDER — OXYCODONE-ACETAMINOPHEN 5-325 MG PO TABS: 1.0000 | ORAL_TABLET | ORAL | 0 refills | Status: DC | PRN

## 2022-04-21 MED ORDER — OXYBUTYNIN CHLORIDE 5 MG PO TABS: 5.0000 mg | ORAL_TABLET | Freq: Three times a day (TID) | ORAL | 0 refills | Status: DC

## 2022-04-21 NOTE — Telephone Encounter (Signed)
Spoke with pt. Pt. Advised medication has been sent. Patient verbalized understanding and was appreciative.

## 2022-04-21 NOTE — Telephone Encounter (Signed)
Patient called in reporting continued pain from Ureteral Stent, states that she only has Flomax currently and has experienced much relief. We did discuss that blood in urine is normal with a stent in and to not overdue it activity wise. Patient would like to know if you could send her in something to help with the pain/discomfort. Scheduled surgery for 05/08/2022.

## 2022-04-21 NOTE — Telephone Encounter (Signed)
Added Percocet and oxybutynin to her medication list, sent to pharmacy.  Please let her know.  Vanna Scotland, MD

## 2022-04-21 NOTE — Progress Notes (Unsigned)
Surgical Physician Order Form [facility] [place]  * Scheduling expectation : Next Available  *Length of Case: 2 hours  *Clearance needed: no  *Anticoagulation Instructions: Hold all anticoagulants  *Aspirin Instructions: Hold Aspirin  *Post-op visit Date/Instructions:  1 month with RUS prior  *Diagnosis: Left Nephrolithiasis  *Procedure: left  Ureteroscopy w/laser lithotripsy & stent exchange ([id number])   Additional orders: N/A  -Admit type: OUTpatient  -Anesthesia: General  -VTE Prophylaxis Standing Order SCD's       Other:   -Standing Lab Orders Per Anesthesia    Lab other: UA&Urine Culture  -Standing Test orders EKG/Chest x-ray per Anesthesia       Test other:   - Medications:  Ancef 2gm IV  -Other orders:  N/A

## 2022-04-22 ENCOUNTER — Telehealth: Payer: Self-pay

## 2022-04-22 ENCOUNTER — Other Ambulatory Visit
Admission: RE | Admit: 2022-04-22 | Discharge: 2022-04-22 | Disposition: A | Discharge status: Home / Self Care | Payer: BC Managed Care – PPO | Source: Ambulatory Visit | Attending: Urology | Admitting: Urology

## 2022-04-22 DIAGNOSIS — N200 Calculus of kidney: Secondary | ICD-10-CM | POA: Diagnosis present

## 2022-04-22 NOTE — Progress Notes (Signed)
   [facility]  Surgery Date: Date: 05/08/2022  Surgeon: Dr. Vanna Scotland, MD  Inpt ( No  )   Outpt (Yes)   Obs ( No  )   Diagnosis: N20.0 Left Nephrolithiasis  -CPT: [phone]  Surgery: Left Ureteroscopy with laser lithotripsy and stent exchange  Stop Anticoagulations: Yes, and hold ASA  Cardiac/Medical/Pulmonary Clearance needed: no  *Orders entered into EPIC  Date: 04/22/22   *Case booked in [place]  Date: 04/21/2022  *Notified pt of Surgery: Date: 04/21/2022  PRE-OP UA & CX: Yes, will obtain in clinic on 04/30/2022  *Placed into Prior Authorization Work Angela Nevin Date: 04/22/22  Assistant/laser/rep:No

## 2022-04-22 NOTE — Telephone Encounter (Signed)
I spoke with Maria Fuller. We have discussed possible surgery dates and Thursday January 4th, 2024 was agreed upon by all parties. Patient given information about surgery date, what to expect pre-operatively and post operatively.  We discussed that a Pre-Admission Testing office will be calling to set up the pre-op visit that will take place prior to surgery, and that these appointments are typically done over the phone with a Pre-Admissions RN. Informed patient that our office will communicate any additional care to be provided after surgery. Patients questions or concerns were discussed during our call. Advised to call our office should there be any additional information, questions or concerns that arise. Patient verbalized understanding.

## 2022-04-26 LAB — CALCULI, WITH PHOTOGRAPH (CLINICAL LAB)
Calcium Oxalate Dihydrate: 10 %
Calcium Oxalate Monohydrate: 90 %
Weight Calculi: 45 mg

## 2022-04-30 ENCOUNTER — Other Ambulatory Visit: Payer: BC Managed Care – PPO

## 2022-04-30 DIAGNOSIS — N200 Calculus of kidney: Secondary | ICD-10-CM

## 2022-04-30 LAB — URINALYSIS, COMPLETE
Bilirubin, UA: NEGATIVE
Glucose, UA: NEGATIVE
Ketones, UA: NEGATIVE
Nitrite, UA: NEGATIVE
Specific Gravity, UA: 1.025 (ref 1.005–1.030)
Urobilinogen, Ur: 0.2 mg/dL (ref 0.2–1.0)
pH, UA: 5.5 (ref 5.0–7.5)

## 2022-04-30 LAB — MICROSCOPIC EXAMINATION
RBC, Urine: >30 /hpf — AB (ref 0–2)
WBC, UA: >30 /hpf — AB (ref 0–5)

## 2022-05-02 ENCOUNTER — Encounter
Admission: RE | Admit: 2022-05-02 | Discharge: 2022-05-02 | Disposition: A | Discharge status: Home / Self Care | Payer: BC Managed Care – PPO | Source: Ambulatory Visit | Attending: Urology | Admitting: Urology

## 2022-05-02 DIAGNOSIS — Z01818 Encounter for other preprocedural examination: Secondary | ICD-10-CM

## 2022-05-02 NOTE — Patient Instructions (Addendum)
Your procedure is scheduled on:05-08-22 Thursday Report to the Registration Desk on the 1st floor of the Medical Mall.Then proceed to the 2nd floor Surgery Desk To find out your arrival time, please call [phone] between 1PM - 3PM on:05-07-22 Wednesday If your arrival time is 6:00 am, do not arrive prior to that time as the Medical Mall entrance doors do not open until 6:00 am.  REMEMBER: Instructions that are not followed completely may result in serious medical risk, up to and including death; or upon the discretion of your surgeon and anesthesiologist your surgery may need to be rescheduled.  Do not eat food OR drink any liquids after midnight the night before surgery.  No gum chewing, lozengers or hard candies.  TAKE THESE MEDICATIONS THE MORNING OF SURGERY WITH A SIP OF WATER: -buPROPion (WELLBUTRIN XL)  -hyoscyamine (LEVSIN)  -sertraline (ZOLOFT)  -pantoprazole (PROTONIX)-take one the night before surgery and one the morning of surgery  -You may take ondansetron (ZOFRAN-ODT) for nausea if needed -You may take oxyCODONE-acetaminophen (PERCOCET) for pain if needed  One week prior to surgery: Stop Anti-inflammatories (NSAIDS) such as Advil, Aleve, Ibuprofen, Motrin, Naproxen, Naprosyn and Aspirin based products such as Excedrin, Goodys Powder, BC Powder. Stop ANY OVER THE COUNTER supplements/vitamins NOW (05-02-22) until after surgery. You may however, continue to take Tylenol/Percocet if needed for pain up until the day of surgery.  No Alcohol for 24 hours before or after surgery.  No Smoking including e-cigarettes for 24 hours prior to surgery.  No chewable tobacco products for at least 6 hours prior to surgery.  No nicotine patches on the day of surgery.  Do not use any "recreational" drugs for at least a week prior to your surgery.  Please be advised that the combination of cocaine and anesthesia may have negative outcomes, up to and including death. If you test positive for  cocaine, your surgery will be cancelled.  On the morning of surgery brush your teeth with toothpaste and water, you may rinse your mouth with mouthwash if you wish. Do not swallow any toothpaste or mouthwash.  Do not wear jewelry, make-up, hairpins, clips or nail polish.  Do not wear lotions, powders, or perfumes.   Do not shave body from the neck down 48 hours prior to surgery just in case you cut yourself which could leave a site for infection.  Also, freshly shaved skin may become irritated if using the CHG soap.  Contact lenses, hearing aids and dentures may not be worn into surgery.  Do not bring valuables to the hospital. [facility] is not responsible for any missing/lost belongings or valuables.  Notify your doctor if there is any change in your medical condition (cold, fever, infection).  Wear comfortable clothing (specific to your surgery type) to the hospital.  After surgery, you can help prevent lung complications by doing breathing exercises.  Take deep breaths and cough every 1-2 hours. Your doctor may order a device called an Incentive Spirometer to help you take deep breaths. When coughing or sneezing, hold a pillow firmly against your incision with both hands. This is called "splinting." Doing this helps protect your incision. It also decreases belly discomfort.  If you are being admitted to the hospital overnight, leave your suitcase in the car. After surgery it may be brought to your room.  If you are being discharged the day of surgery, you will not be allowed to drive home. You will need a responsible adult (18 years or older) to drive  you home and stay with you that night.   If you are taking public transportation, you will need to have a responsible adult (18 years or older) with you. Please confirm with your physician that it is acceptable to use public transportation.   Please call the Pre-admissions Testing Dept. at [phone] if you have any questions  about these instructions.  Surgery Visitation Policy:  Patients undergoing a surgery or procedure may have two family members or support persons with them as long as the person is not COVID-19 positive or experiencing its symptoms.   Due to an increase in RSV and influenza rates and associated hospitalizations, children ages 52 and under will not be able to visit patients in [facility]. Masks continue to be strongly recommended.

## 2022-05-03 LAB — CULTURE, URINE COMPREHENSIVE

## 2022-05-06 ENCOUNTER — Other Ambulatory Visit: Payer: Self-pay | Admitting: Urology

## 2022-05-06 ENCOUNTER — Encounter: Payer: Self-pay | Admitting: Urology

## 2022-05-06 MED ORDER — OXYCODONE-ACETAMINOPHEN 5-325 MG PO TABS: 1.0000 | ORAL_TABLET | ORAL | 0 refills | Status: DC | PRN

## 2022-05-07 MED ORDER — ORAL CARE MOUTH RINSE: 15.0000 mL | Freq: Once | OROMUCOSAL | Status: AC

## 2022-05-07 MED ORDER — CHLORHEXIDINE GLUCONATE 0.12 % MT SOLN: 15.0000 mL | Freq: Once | OROMUCOSAL | Status: AC

## 2022-05-07 MED ORDER — CEFAZOLIN SODIUM-DEXTROSE 2-4 GM/100ML-% IV SOLN
2.0000 g | INTRAVENOUS | Status: AC
  Administered 2022-05-08: 2 g via INTRAVENOUS

## 2022-05-07 MED ORDER — LACTATED RINGERS IV SOLN: INTRAVENOUS | Status: DC

## 2022-05-08 ENCOUNTER — Encounter
Admission: RE | Disposition: A | Discharge status: Home / Self Care | Payer: Self-pay | Source: Home / Self Care | Attending: Urology

## 2022-05-08 ENCOUNTER — Ambulatory Visit: Payer: BC Managed Care – PPO

## 2022-05-08 ENCOUNTER — Encounter: Payer: Self-pay | Admitting: Urology

## 2022-05-08 ENCOUNTER — Ambulatory Visit: Payer: BC Managed Care – PPO | Admitting: Anesthesiology

## 2022-05-08 ENCOUNTER — Other Ambulatory Visit: Payer: Self-pay

## 2022-05-08 ENCOUNTER — Ambulatory Visit
Admission: RE | Admit: 2022-05-08 | Discharge: 2022-05-08 | Disposition: A | Discharge status: Home / Self Care | Payer: BC Managed Care – PPO | Attending: Urology | Admitting: Urology

## 2022-05-08 DIAGNOSIS — Z9884 Bariatric surgery status: Secondary | ICD-10-CM | POA: Diagnosis not present

## 2022-05-08 DIAGNOSIS — F32A Depression, unspecified: Secondary | ICD-10-CM | POA: Insufficient documentation

## 2022-05-08 DIAGNOSIS — Z6830 Body mass index (BMI) 30.0-30.9, adult: Secondary | ICD-10-CM | POA: Diagnosis not present

## 2022-05-08 DIAGNOSIS — N202 Calculus of kidney with calculus of ureter: Secondary | ICD-10-CM | POA: Diagnosis not present

## 2022-05-08 DIAGNOSIS — E119 Type 2 diabetes mellitus without complications: Secondary | ICD-10-CM | POA: Insufficient documentation

## 2022-05-08 DIAGNOSIS — N201 Calculus of ureter: Secondary | ICD-10-CM

## 2022-05-08 DIAGNOSIS — G4730 Sleep apnea, unspecified: Secondary | ICD-10-CM | POA: Diagnosis not present

## 2022-05-08 DIAGNOSIS — E039 Hypothyroidism, unspecified: Secondary | ICD-10-CM | POA: Insufficient documentation

## 2022-05-08 DIAGNOSIS — Z9049 Acquired absence of other specified parts of digestive tract: Secondary | ICD-10-CM | POA: Insufficient documentation

## 2022-05-08 DIAGNOSIS — Z79899 Other long term (current) drug therapy: Secondary | ICD-10-CM | POA: Diagnosis not present

## 2022-05-08 DIAGNOSIS — Z01818 Encounter for other preprocedural examination: Secondary | ICD-10-CM

## 2022-05-08 DIAGNOSIS — E7800 Pure hypercholesterolemia, unspecified: Secondary | ICD-10-CM | POA: Insufficient documentation

## 2022-05-08 DIAGNOSIS — E282 Polycystic ovarian syndrome: Secondary | ICD-10-CM | POA: Insufficient documentation

## 2022-05-08 DIAGNOSIS — Z87442 Personal history of urinary calculi: Secondary | ICD-10-CM | POA: Diagnosis not present

## 2022-05-08 DIAGNOSIS — N200 Calculus of kidney: Secondary | ICD-10-CM

## 2022-05-08 DIAGNOSIS — I10 Essential (primary) hypertension: Secondary | ICD-10-CM | POA: Insufficient documentation

## 2022-05-08 HISTORY — PX: CYSTOSCOPY/URETEROSCOPY/HOLMIUM LASER/STENT PLACEMENT: SHX6546

## 2022-05-08 LAB — POCT PREGNANCY, URINE: Preg Test, Ur: NEGATIVE

## 2022-05-08 SURGERY — CYSTOSCOPY/URETEROSCOPY/HOLMIUM LASER/STENT PLACEMENT
Anesthesia: General | Site: Ureter | Laterality: Left

## 2022-05-08 MED ORDER — CHLORHEXIDINE GLUCONATE 0.12 % MT SOLN
OROMUCOSAL | Status: AC
  Administered 2022-05-08: 15 mL via OROMUCOSAL
  Filled 2022-05-08: qty 15

## 2022-05-08 MED ORDER — FENTANYL CITRATE (PF) 100 MCG/2ML IJ SOLN
INTRAMUSCULAR | Status: AC
  Filled 2022-05-08: qty 2

## 2022-05-08 MED ORDER — LIDOCAINE HCL (PF) 2 % IJ SOLN
INTRAMUSCULAR | Status: AC
  Filled 2022-05-08: qty 5

## 2022-05-08 MED ORDER — PROPOFOL 10 MG/ML IV BOLUS
INTRAVENOUS | Status: AC
  Filled 2022-05-08: qty 40

## 2022-05-08 MED ORDER — DEXAMETHASONE SODIUM PHOSPHATE 10 MG/ML IJ SOLN
INTRAMUSCULAR | Status: DC | PRN
  Administered 2022-05-08: 4 mg via INTRAVENOUS

## 2022-05-08 MED ORDER — GLYCOPYRROLATE 0.2 MG/ML IJ SOLN
INTRAMUSCULAR | Status: AC
  Filled 2022-05-08: qty 1

## 2022-05-08 MED ORDER — STERILE WATER FOR IRRIGATION IR SOLN
Status: DC | PRN
  Administered 2022-05-08: 500 mL

## 2022-05-08 MED ORDER — IOHEXOL 180 MG/ML  SOLN
INTRAMUSCULAR | Status: DC | PRN
  Administered 2022-05-08: 20 mL

## 2022-05-08 MED ORDER — ONDANSETRON HCL 4 MG/2ML IJ SOLN
INTRAMUSCULAR | Status: DC | PRN
  Administered 2022-05-08: 4 mg via INTRAVENOUS

## 2022-05-08 MED ORDER — MIDAZOLAM HCL 2 MG/2ML IJ SOLN
INTRAMUSCULAR | Status: AC
  Filled 2022-05-08: qty 2

## 2022-05-08 MED ORDER — TAMSULOSIN HCL 0.4 MG PO CAPS: 0.4000 mg | ORAL_CAPSULE | Freq: Every day | ORAL | 5 refills | Status: DC

## 2022-05-08 MED ORDER — SUGAMMADEX SODIUM 200 MG/2ML IV SOLN
INTRAVENOUS | Status: DC | PRN
  Administered 2022-05-08: 200 mg via INTRAVENOUS

## 2022-05-08 MED ORDER — LACTATED RINGERS IV SOLN: INTRAVENOUS | Status: DC | PRN

## 2022-05-08 MED ORDER — ROCURONIUM BROMIDE 100 MG/10ML IV SOLN
INTRAVENOUS | Status: DC | PRN
  Administered 2022-05-08: 50 mg via INTRAVENOUS

## 2022-05-08 MED ORDER — FENTANYL CITRATE (PF) 100 MCG/2ML IJ SOLN
INTRAMUSCULAR | Status: DC | PRN
  Administered 2022-05-08 (×4): 50 ug via INTRAVENOUS

## 2022-05-08 MED ORDER — SUCCINYLCHOLINE CHLORIDE 200 MG/10ML IV SOSY
PREFILLED_SYRINGE | INTRAVENOUS | Status: AC
  Filled 2022-05-08: qty 10

## 2022-05-08 MED ORDER — MIDAZOLAM HCL 2 MG/2ML IJ SOLN
INTRAMUSCULAR | Status: DC | PRN
  Administered 2022-05-08: 2 mg via INTRAVENOUS

## 2022-05-08 MED ORDER — ONDANSETRON HCL 4 MG/2ML IJ SOLN: 4.0000 mg | Freq: Once | INTRAMUSCULAR | Status: DC | PRN

## 2022-05-08 MED ORDER — OXYBUTYNIN CHLORIDE 5 MG PO TABS: 5.0000 mg | ORAL_TABLET | Freq: Three times a day (TID) | ORAL | 0 refills | Status: DC | PRN

## 2022-05-08 MED ORDER — PROPOFOL 10 MG/ML IV BOLUS
INTRAVENOUS | Status: DC | PRN
  Administered 2022-05-08: 20 mg via INTRAVENOUS
  Administered 2022-05-08: 30 mg via INTRAVENOUS
  Administered 2022-05-08: 150 mg via INTRAVENOUS

## 2022-05-08 MED ORDER — FENTANYL CITRATE (PF) 100 MCG/2ML IJ SOLN
25.0000 ug | INTRAMUSCULAR | Status: DC | PRN
  Administered 2022-05-08 (×4): 25 ug via INTRAVENOUS

## 2022-05-08 MED ORDER — ROCURONIUM BROMIDE 10 MG/ML (PF) SYRINGE
PREFILLED_SYRINGE | INTRAVENOUS | Status: AC
  Filled 2022-05-08: qty 10

## 2022-05-08 MED ORDER — PROPOFOL 500 MG/50ML IV EMUL
INTRAVENOUS | Status: DC | PRN
  Administered 2022-05-08: 25 ug/kg/min via INTRAVENOUS

## 2022-05-08 MED ORDER — SODIUM CHLORIDE 0.9 % IR SOLN
Status: DC | PRN
  Administered 2022-05-08: 2000 mL

## 2022-05-08 MED ORDER — CEFAZOLIN SODIUM-DEXTROSE 2-4 GM/100ML-% IV SOLN
INTRAVENOUS | Status: AC
  Filled 2022-05-08: qty 100

## 2022-05-08 MED ORDER — LIDOCAINE HCL (CARDIAC) PF 100 MG/5ML IV SOSY
PREFILLED_SYRINGE | INTRAVENOUS | Status: DC | PRN
  Administered 2022-05-08 (×2): 50 mg via INTRAVENOUS

## 2022-05-08 SURGICAL SUPPLY — 30 items
ADH LQ OCL WTPRF AMP STRL LF (MISCELLANEOUS) ×1
ADHESIVE MASTISOL STRL (MISCELLANEOUS) IMPLANT
BAG DRAIN SIEMENS DORNER NS (MISCELLANEOUS) ×1 IMPLANT
BAG DRN NS LF (MISCELLANEOUS) ×1
BASKET ZERO TIP 1.9FR (BASKET) IMPLANT
BRUSH SCRUB EZ 1% IODOPHOR (MISCELLANEOUS) ×1 IMPLANT
BSKT STON RTRVL ZERO TP 1.9FR (BASKET)
CATH URET FLEX-TIP 2 LUMEN 10F (CATHETERS) IMPLANT
CATH URETL OPEN 5X70 (CATHETERS) ×1 IMPLANT
CNTNR SPEC 2.5X3XGRAD LEK (MISCELLANEOUS)
CONT SPEC 4OZ STRL OR WHT (MISCELLANEOUS)
CONTAINER SPEC 2.5X3XGRAD LEK (MISCELLANEOUS) IMPLANT
DRAPE UTILITY 15X26 TOWEL STRL (DRAPES) ×1 IMPLANT
DRSG TEGADERM 2-3/8X2-3/4 SM (GAUZE/BANDAGES/DRESSINGS) IMPLANT
FIBER LASER MOSES 200 DFL (Laser) IMPLANT
GLOVE BIO SURGEON STRL SZ 6.5 (GLOVE) ×1 IMPLANT
GOWN STRL REUS W/ TWL LRG LVL3 (GOWN DISPOSABLE) ×2 IMPLANT
GOWN STRL REUS W/TWL LRG LVL3 (GOWN DISPOSABLE) ×2
GUIDEWIRE GREEN .038 145CM (MISCELLANEOUS) IMPLANT
GUIDEWIRE STR DUAL SENSOR (WIRE) ×1 IMPLANT
IV NS IRRIG 3000ML ARTHROMATIC (IV SOLUTION) ×1 IMPLANT
KIT TURNOVER CYSTO (KITS) ×1 IMPLANT
PACK CYSTO AR (MISCELLANEOUS) ×1 IMPLANT
SET CYSTO W/LG BORE CLAMP LF (SET/KITS/TRAYS/PACK) ×1 IMPLANT
SHEATH NAVIGATOR HD 12/14X36 (SHEATH) IMPLANT
STENT URET 6FRX24 CONTOUR (STENTS) IMPLANT
STENT URET 6FRX26 CONTOUR (STENTS) IMPLANT
SURGILUBE 2OZ TUBE FLIPTOP (MISCELLANEOUS) ×1 IMPLANT
TRAP FLUID SMOKE EVACUATOR (MISCELLANEOUS) ×1 IMPLANT
WATER STERILE IRR 500ML POUR (IV SOLUTION) ×1 IMPLANT

## 2022-05-08 NOTE — Anesthesia Preprocedure Evaluation (Signed)
Anesthesia Evaluation  Patient identified by MRN, date of birth, ID band Patient awake    Reviewed: Allergy & Precautions, NPO status , Patient's Chart, lab work & pertinent test results  Airway Mallampati: II  TM Distance: >3 FB Neck ROM: full    Dental  (+) Teeth Intact   Pulmonary neg pulmonary ROS, sleep apnea    Pulmonary exam normal breath sounds clear to auscultation       Cardiovascular Exercise Tolerance: Good hypertension, Pt. on medications negative cardio ROS Normal cardiovascular exam Rhythm:Regular     Neuro/Psych  Headaches   Depression    negative neurological ROS  negative psych ROS   GI/Hepatic negative GI ROS, Neg liver ROS,,,  Endo/Other  negative endocrine ROSdiabetes, Well Controlled, Type 2Hypothyroidism    Renal/GU Renal disease     Musculoskeletal   Abdominal   Peds negative pediatric ROS (+)  Hematology negative hematology ROS (+)   Anesthesia Other Findings Past Medical History: No date: Anemia     Comment:  slight No date: Bradycardia No date: Depression No date: Diabetes mellitus without complication (HCC)     Comment:  resolved with weight loss from gastric bypass No date: Headache     Comment:  migraines. resolved since gastric bypass No date: High cholesterol     Comment:  resolved since weight loss surgery No date: History of kidney stones     Comment:  last week No date: Hypertension     Comment:  resolved since gastric bypass, severe pre-eclampsia with              G1 No date: Low BP 2015: Meningitis No date: PCOS (polycystic ovarian syndrome) No date: Preterm labor No date: Sleep apnea     Comment:  had gastric bypass surgery and now resolved No date: Syncope couple years ago: Vertigo  Past Surgical History: No date: CESAREAN SECTION 05/07/2016: CESAREAN SECTION; N/A     Comment:  Procedure: CESAREAN SECTION;  Surgeon: Gae Dry,               MD;   Location: [facility] ORS;  Service: Obstetrics;                Laterality: N/A;  Time of birth: [birth year] Sex:               Female Weight: 3940 kg, 8 lb 11 oz No date: CHOLECYSTECTOMY 09/19/2016: CYSTOSCOPY WITH STENT PLACEMENT; Right     Comment:  Procedure: [place];  Surgeon:               Nickie Retort, MD;  Location: [facility] ORS;  Service:               Urology;  Laterality: Right; 04/18/2022: CYSTOSCOPY WITH STENT PLACEMENT; Left     Comment:  Procedure: CYSTOSCOPY WITH STENT PLACEMENT;  Surgeon:               Irine Seal, MD;  Location: [facility] ORS;  Service: Urology;               Laterality: Left; 07/28/2019: FLEXIBLE SIGMOIDOSCOPY; N/A     Comment:  Procedure: FLEXIBLE SIGMOIDOSCOPY;  Surgeon: Lin Landsman, MD;  Location: [place];                Service: Endoscopy;  Laterality: N/A; 04/2015: GASTRIC BYPASS     Comment:  Duodenal  switch 09/28/2018: LAPAROSCOPIC OVARIAN CYSTECTOMY; Right     Comment:  Procedure: LAPAROSCOPIC RIGHT OVARIAN CYSTECTOMY;                Surgeon: Gae Dry, MD;  Location: [facility] ORS;                Service: Gynecology;  Laterality: Right; 07/25/2021: LAPAROSCOPIC UNILATERAL SALPINGO OOPHERECTOMY; Right     Comment:  Procedure: LAPAROSCOPIC UNILATERAL SALPINGO               OOPHORECTOMY;  Surgeon: Gae Dry, MD;  Location:               [facility] ORS;  Service: Gynecology;  Laterality: Right; 04/07/2018: LEFT HEART CATH AND CORONARY ANGIOGRAPHY; Left     Comment:  Procedure: LEFT HEART CATH AND CORONARY ANGIOGRAPHY;                Surgeon: Yolonda Kida, MD;  Location: [place]              CV LAB;  Service: Cardiovascular;  Laterality: Left; 07/2019: small bowel obstruction No date: TONSILLECTOMY 09/19/2016: URETEROSCOPY WITH HOLMIUM LASER LITHOTRIPSY; Right     Comment:  Procedure: URETEROSCOPY WITH HOLMIUM LASER LITHOTRIPSY;               Surgeon: Nickie Retort, MD;  Location: [facility]  ORS;                Service: Urology;  Laterality: Right; 07/27/2021: XI ROBOTIC ASSISTED VENTRAL HERNIA; N/A     Comment:  Procedure: XI ROBOTIC ASSISTED INCARCERATED INCISIONAL               HERNIA REPAIR;  Surgeon: [name], MD;  Location:              [facility] ORS;  Service: General;  Laterality: N/A;  BMI    Body Mass Index: 30.61 kg/m      Reproductive/Obstetrics negative OB ROS                             Anesthesia Physical Anesthesia Plan  ASA: 2  Anesthesia Plan: General   Post-op Pain Management:    Induction: Intravenous  PONV Risk Score and Plan: Ondansetron, Dexamethasone, Midazolam and Treatment may vary due to age or medical condition  Airway Management Planned: Oral ETT  Additional Equipment:   Intra-op Plan:   Post-operative Plan: Extubation in OR  Informed Consent: I have reviewed the patients History and Physical, chart, labs and discussed the procedure including the risks, benefits and alternatives for the proposed anesthesia with the patient or authorized representative who has indicated his/her understanding and acceptance.     Dental Advisory Given  Plan Discussed with: CRNA and Surgeon  Anesthesia Plan Comments:        Anesthesia Quick Evaluation

## 2022-05-08 NOTE — Anesthesia Postprocedure Evaluation (Signed)
Anesthesia Post Note  Patient: Maria Fuller  Procedure(s) Performed: CYSTOSCOPY/URETEROSCOPY/HOLMIUM LASER/STENT EXCHANGE (Left: Ureter)  Patient location during evaluation: PACU Anesthesia Type: General Level of consciousness: awake Pain management: satisfactory to patient Vital Signs Assessment: post-procedure vital signs reviewed and stable Respiratory status: nonlabored ventilation and respiratory function stable Cardiovascular status: stable Anesthetic complications: no  No notable events documented.   Last Vitals:  Vitals:   05/08/22 0945 05/08/22 1000  BP: 120/72 119/67  Pulse: 73 80  Resp: 17 16  Temp:  (!) 36.1 C  SpO2: 93% 96%    Last Pain:  Vitals:   05/08/22 1000  TempSrc: Temporal  PainSc: 3                  VAN STAVEREN,Genene Kilman

## 2022-05-08 NOTE — Op Note (Signed)
Date of procedure: 05/08/22  Preoperative diagnosis:  Left ureteral calculus Left nonobstructing stone  Postoperative diagnosis:  Same as above  Procedure: Left ureteroscopy Laser lithotripsy Left ureteral stent exchange Retrograde pyelogram Interpretation of fluoroscopy less than 30 minutes  Surgeon: [name], MD  Anesthesia: General  Complications: None  Intraoperative findings: Previous proximal ureter stone at the level of the iliacs, obliterated.  Lower pole stone burden also addressed.  Stent left on tether.  Uncomplicated procedure.  EBL: Minimal  Drains: 6 x 26 French double-J ureteral stent on left on tether  Plan: None  Indication: Maria Fuller is a 37 y.o. patient with proximal ureteral calculus who ended up undergoing emergent ureteral stent placement who returns today for staged procedure both to address the stone as well as her lower pole stone burden.  After reviewing the management options for treatment, she/her/hers  elected to proceed with the above surgical procedure(s). We have discussed the potential benefits and risks of the procedure, side effects of the proposed treatment, the likelihood of the patient achieving the goals of the procedure, and any potential problems that might occur during the procedure or recuperation. Informed consent has been obtained.  Description of procedure:  The patient was taken to the operating room and general anesthesia was induced.  The patient was placed in the dorsal lithotomy position, prepped and draped in the usual sterile fashion, and preoperative antibiotics were administered. A preoperative time-out was performed.    A 21 French scope was advanced per urethra into the bladder.  Attention was turned to the left ureteral orifice from which a ureteral stent was seen emanating.  The distal coil of the stent was grasped and brought to level urethral meatus.  This was then cannulated using a sensor wire up to the  level of the kidney.  A dual-lumen ureteral access sheath was used just within the distal ureter and additional contrast was used to create a retrograde pyelogram on the side.  There was a filling defect at the level of the stone.  A Super Stiff wire was then introduced all the way up to the level of the kidney through the second lumen of the catheter.  The sensor wire was snapped in place as a safety wire.  A ureteral access sheath, 10/12 [place] was advanced just proximal to the ureteral stone.  A dual-lumen digital ureteroscope was then advanced up to the level of the stone.  A 242 m laser fiber was then brought in using dusting settings of 0.3 J and 60 Hz, the stone was dusted.  All fragments were removed.  The scope was then advanced into the each of the calyces and additional stone burden was identified and dusted.  At the end of the procedure, no significant residual stone fragment remained greater than the size of the tip of the laser fiber.  A final retrograde pyelogram created roadmap to ensure that each every calyx was visualized and all significant stone burden was addressed.  There was no contrast extravasation.  The scope was then backed down the length of the ureter inspecting the ureteral integrity along the way.  There was no residual stone burden or significant ureteral injuries appreciated.  Finally, a 6 by 21 French ureteral access sheath was advanced over the safety wire up to the level of the kidney.  Upon wire removal, there was a full coil noted both within the renal pelvis as well as within the bladder.  The stent string was left affixed to  the distal coil of the stent and secured to the patient's left inner thigh using Mastisol and Tegaderm.  The patient was then cleaned and dried, repositioned in supine position, reversed of anesthesia, taken to the PACU in stable condition.  Plan: She may remove her own stent on Monday.  Follow-up in 4 to 6 weeks with renal ultrasound prior.  [name], M.D.

## 2022-05-08 NOTE — Anesthesia Procedure Notes (Signed)
Procedure Name: Intubation Date/Time: 05/08/2022 7:53 AM  Performed by: Adalberto Ill, CRNAPre-anesthesia Checklist: Patient identified, Emergency Drugs available, Suction available, Patient being monitored and Timeout performed Patient Re-evaluated:Patient Re-evaluated prior to induction Oxygen Delivery Method: Circle system utilized Preoxygenation: Pre-oxygenation with 100% oxygen Induction Type: IV induction Ventilation: Mask ventilation without difficulty Laryngoscope Size: Miller and 2 Grade View: Grade I Tube type: Oral Tube size: 7.0 mm Number of attempts: 1 Airway Equipment and Method: Stylet Placement Confirmation: ETT inserted through vocal cords under direct vision, positive ETCO2 and breath sounds checked- equal and bilateral Secured at: 21 cm Tube secured with: Tape Dental Injury: Teeth and Oropharynx as per pre-operative assessment  Comments: Brief atraumatic dentition unchanged

## 2022-05-08 NOTE — Telephone Encounter (Signed)
I was not able to find a note about the medication from today, sending to Dr Erlene Quan to review.

## 2022-05-08 NOTE — Discharge Instructions (Addendum)
  You have a ureteral stent in place.  This is a tube that extends from your kidney to your bladder.  This may cause urinary bleeding, burning with urination, and urinary frequency.  Please call our office or present to the ED if you develop fevers >101 or pain which is not able to be controlled with oral pain medications.  You may be given either Flomax and/ or ditropan to help with bladder spasms and stent pain in addition to pain medications.    Your stent is on a string.  It is taped to your left inner thigh.  On Monday morning, you may untape this stent string and pull gently until the entire stent is removed.  If you have any difficulties, please call our office.  If your stent is inadvertently pulled prematurely, this is not an emergency unless you have severe pain or fevers.  Please let our office know on a nonemergent basis.  [place] [address], [place], [place] [address] [phone]       AMBULATORY SURGERY  DISCHARGE INSTRUCTIONS   The drugs that you were given will stay in your system until tomorrow so for the next 24 hours you should not:  Drive an automobile Make any legal decisions Drink any alcoholic beverage   You may resume regular meals tomorrow.  Today it is better to start with liquids and gradually work up to solid foods.  You may eat anything you prefer, but it is better to start with liquids, then soup and crackers, and gradually work up to solid foods.   Please notify your doctor immediately if you have any unusual bleeding, trouble breathing, redness and pain at the surgery site, drainage, fever, or pain not relieved by medication.   Additional Instructions:

## 2022-05-08 NOTE — Transfer of Care (Signed)
Immediate Anesthesia Transfer of Care Note  Patient: Ahmyah Gidley Wolfman  Procedure(s) Performed: CYSTOSCOPY/URETEROSCOPY/HOLMIUM LASER/STENT EXCHANGE (Left: Ureter)  Patient Location: PACU  Anesthesia Type:General  Level of Consciousness: awake, alert , oriented, and patient cooperative  Airway & Oxygen Therapy: Patient Spontanous Breathing and Patient connected to face mask oxygen  Post-op Assessment: Report given to RN and Post -op Vital signs reviewed and stable  Post vital signs: Reviewed and stable  Last Vitals:  Vitals Value Taken Time  BP 123/75 05/08/22 0855  Temp 36.6 C 05/08/22 0855  Pulse 68 05/08/22 0857  Resp 13 05/08/22 0857  SpO2 97 % 05/08/22 0857  Vitals shown include unvalidated device data.  Last Pain:  Vitals:   05/08/22 0626  TempSrc: Oral  PainSc: 4          Complications: No notable events documented.

## 2022-05-08 NOTE — Interval H&P Note (Signed)
History and Physical Interval Note:  05/08/2022 7:27 AM  Maria Fuller  has presented today for surgery, with the diagnosis of Left Nephrolithiasis.  The various methods of treatment have been discussed with the patient and family. After consideration of risks, benefits and other options for treatment, the patient has consented to  Procedure(s): CYSTOSCOPY/URETEROSCOPY/HOLMIUM LASER/STENT EXCHANGE (Left) as a surgical intervention.  The patient's history has been reviewed, patient examined, no change in status, stable for surgery.  I have reviewed the patient's chart and labs.  Questions were answered to the patient's satisfaction.    [facility] CTAB  S/p ureteral stent who returns today for definitive management of stone   [name]

## 2022-05-09 ENCOUNTER — Encounter: Payer: Self-pay | Admitting: Urology

## 2022-05-13 ENCOUNTER — Telehealth: Payer: Self-pay

## 2022-05-13 ENCOUNTER — Encounter: Payer: Self-pay | Admitting: Urology

## 2022-05-13 ENCOUNTER — Ambulatory Visit (INDEPENDENT_AMBULATORY_CARE_PROVIDER_SITE_OTHER): Payer: BC Managed Care – PPO | Admitting: Urology

## 2022-05-13 VITALS — BP 138/79 | HR 80 | Ht 73.0 in | Wt 232.0 lb

## 2022-05-13 DIAGNOSIS — R109 Unspecified abdominal pain: Secondary | ICD-10-CM | POA: Diagnosis not present

## 2022-05-13 DIAGNOSIS — N200 Calculus of kidney: Secondary | ICD-10-CM

## 2022-05-13 LAB — URINALYSIS, COMPLETE
Bilirubin, UA: NEGATIVE
Glucose, UA: NEGATIVE
Leukocytes,UA: NEGATIVE
Nitrite, UA: NEGATIVE
RBC, UA: NEGATIVE
Specific Gravity, UA: 1.02 (ref 1.005–1.030)
Urobilinogen, Ur: 1 mg/dL (ref 0.2–1.0)
pH, UA: 6.5 (ref 5.0–7.5)

## 2022-05-13 LAB — MICROSCOPIC EXAMINATION

## 2022-05-13 MED ORDER — KETOROLAC TROMETHAMINE 60 MG/2ML IM SOLN
60.0000 mg | Freq: Once | INTRAMUSCULAR | Status: AC
  Administered 2022-05-13: 30 mg via INTRAMUSCULAR

## 2022-05-13 MED ORDER — DIAZEPAM 10 MG PO TABS: 10.0000 mg | ORAL_TABLET | Freq: Three times a day (TID) | ORAL | 0 refills | Status: DC | PRN

## 2022-05-13 NOTE — Telephone Encounter (Signed)
Pt called triage line-  Pt is s/p litho on 1/4. Removed stent on 1/8. She is now having kidney pain/back pain. She took 2 percocet 2 hours ago with no relief. She wants to know if she needs to go to the ED.   Urine is dark red - + tiny fragments   Fever on 1/7- 100.3  + eating and drinking  Pt has labored breathing   AJB aware- states that when pts remove stent the ureter can spasm and cause pain x 24H. Nsaids help. Pt is s/p bariatric surgery and is unable to take Motrin po. Per AJB-make pt an appt today for Toradol injection. OK to double book.   Pt aware. Appt made for 10am today.

## 2022-05-13 NOTE — Progress Notes (Signed)
I, [name],acting as a scribe for [name], MD.,have documented all relevant documentation on the behalf of [name], MD,as directed by  [name], MD while in the presence of [name], MD.   05/13/22 12:06 PM   Maria Fuller 1986-04-03 [id number]  Referring provider: Sherre Scarlet, PA-C [place],  [place] [address]  Chief Complaint  Patient presents with   Nephrolithiasis    HPI: 37 year-old female who underwent ureteroscopy last week, here for further evaluation of left flank pain.   She removed her stent yesterday morning and is having severe flank pain on the left side that started this morning. She's taken two Percocets without relief. She had a low-grade temp of 100.3 on Sunday, but otherwise is afebrile. Her vitals here are normal.   She is still taking Flomax and Oxybutynin. She states that she is passing some fragments. She's accompanied by her husband.   PMH: Past Medical History:  Diagnosis Date   Anemia    slight   Bradycardia    Depression    Diabetes mellitus without complication ([place])    resolved with weight loss from gastric bypass   Headache    migraines. resolved since gastric bypass   High cholesterol    resolved since weight loss surgery   History of kidney stones    last week   Hypertension    resolved since gastric bypass, severe pre-eclampsia with G1   Low BP    Meningitis 2015   PCOS (polycystic ovarian syndrome)    Preterm labor    Sleep apnea    had gastric bypass surgery and now resolved   Syncope    Vertigo couple years ago    Surgical History: Past Surgical History:  Procedure Laterality Date   CESAREAN SECTION     CESAREAN SECTION N/A 05/07/2016   Procedure: CESAREAN SECTION;  Surgeon: Gae Dry, MD;  Location: [facility];  Service: Obstetrics;  Laterality: N/A;  Time of birth: [birth year] Sex: Female Weight: 3940 kg, 8 lb 11 oz   CHOLECYSTECTOMY     CYSTOSCOPY WITH STENT PLACEMENT  Right 09/19/2016   Procedure: CYSTOSCOPY WITH STENT PLACEMENT;  Surgeon: Nickie Retort, MD;  Location: [facility];  Service: Urology;  Laterality: Right;   CYSTOSCOPY WITH STENT PLACEMENT Left 04/18/2022   Procedure: CYSTOSCOPY WITH STENT PLACEMENT;  Surgeon: Irine Seal, MD;  Location: [facility];  Service: Urology;  Laterality: Left;   CYSTOSCOPY/URETEROSCOPY/HOLMIUM LASER/STENT PLACEMENT Left 05/08/2022   Procedure: CYSTOSCOPY/URETEROSCOPY/HOLMIUM LASER/STENT EXCHANGE;  Surgeon: [name], MD;  Location: [facility];  Service: Urology;  Laterality: Left;   FLEXIBLE SIGMOIDOSCOPY N/A 07/28/2019   Procedure: FLEXIBLE SIGMOIDOSCOPY;  Surgeon: Lin Landsman, MD;  Location: [facility];  Service: Endoscopy;  Laterality: N/A;   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   LAPAROSCOPIC OVARIAN CYSTECTOMY Right 09/28/2018   Procedure: LAPAROSCOPIC RIGHT OVARIAN CYSTECTOMY;  Surgeon: Gae Dry, MD;  Location: [facility];  Service: Gynecology;  Laterality: Right;   LAPAROSCOPIC UNILATERAL SALPINGO OOPHERECTOMY Right 07/25/2021   Procedure: LAPAROSCOPIC UNILATERAL SALPINGO OOPHORECTOMY;  Surgeon: Gae Dry, MD;  Location: [facility];  Service: Gynecology;  Laterality: Right;   LEFT HEART CATH AND CORONARY ANGIOGRAPHY Left 04/07/2018   Procedure: LEFT HEART CATH AND CORONARY ANGIOGRAPHY;  Surgeon: Yolonda Kida, MD;  Location: [facility];  Service: Cardiovascular;  Laterality: Left;   small bowel obstruction  07/2019   TONSILLECTOMY  URETEROSCOPY WITH HOLMIUM LASER LITHOTRIPSY Right 09/19/2016   Procedure: URETEROSCOPY WITH HOLMIUM LASER LITHOTRIPSY;  Surgeon: Hildred Laser, MD;  Location: [facility];  Service: Urology;  Laterality: Right;   XI ROBOTIC ASSISTED VENTRAL HERNIA N/A 07/27/2021   Procedure: XI ROBOTIC ASSISTED INCARCERATED INCISIONAL HERNIA REPAIR;  Surgeon: Campbell Lerner, MD;  Location: [facility];  Service: General;  Laterality: N/A;    Home  Medications:  Allergies as of 05/13/2022       Reactions   Compazine [prochlorperazine] Other (See Comments)   Panic attack   Ceftin [cefuroxime Axetil] Rash   Tolerated cefazolin before   Penicillins Rash   TOLERATED CEFAZOLIN BEFORE Has patient had a PCN reaction causing immediate rash, facial/tongue/throat swelling, SOB or lightheadedness with hypotension: Yes Has patient had a PCN reaction causing severe rash involving mucus membranes or skin necrosis: No Has patient had a PCN reaction that required hospitalization No Has patient had a PCN reaction occurring within the last 10 years: No If all of the above answers are "NO", then may proceed with Cephalosporin use.        Medication List        Accurate as of May 13, 2022 12:06 PM. If you have any questions, ask your nurse or doctor.          buPROPion 150 MG 24 hr tablet Commonly known as: WELLBUTRIN XL Take 150 mg by mouth every morning.   diazepam 10 MG tablet Commonly known as: Valium Take 1 tablet (10 mg total) by mouth every 8 (eight) hours as needed (ureteral spasm). Take 1 hour prior to procedure. Please have a driver available. Started by: Vanna Scotland, MD   dicyclomine 20 MG tablet Commonly known as: BENTYL Take 20 mg by mouth as needed.   hyoscyamine 0.125 MG/ML solution Commonly known as: LEVSIN Take 0.125 mg by mouth 3 (three) times daily.   levonorgestrel 20 MCG/24HR IUD Commonly known as: MIRENA 1 each by Intrauterine route once.   ondansetron 4 MG disintegrating tablet Commonly known as: ZOFRAN-ODT Take 1 tablet (4 mg total) by mouth every 8 (eight) hours as needed for nausea or vomiting.   oxybutynin 5 MG tablet Commonly known as: DITROPAN Take 1 tablet (5 mg total) by mouth every 8 (eight) hours as needed for bladder spasms.   oxyCODONE-acetaminophen 5-325 MG tablet Commonly known as: Percocet Take 1-2 tablets by mouth every 4 (four) hours as needed for moderate pain or severe  pain.   pantoprazole 40 MG tablet Commonly known as: PROTONIX Take 40 mg by mouth every morning.   sertraline 100 MG tablet Commonly known as: ZOLOFT Take 100 mg by mouth 2 (two) times daily.   tamsulosin 0.4 MG Caps capsule Commonly known as: FLOMAX Take 1 capsule (0.4 mg total) by mouth daily.   traZODone 50 MG tablet Commonly known as: DESYREL Take 50 mg by mouth at bedtime.        Allergies:  Allergies  Allergen Reactions   Compazine [Prochlorperazine] Other (See Comments)    Panic attack   Ceftin [Cefuroxime Axetil] Rash    Tolerated cefazolin before   Penicillins Rash    TOLERATED CEFAZOLIN BEFORE Has patient had a PCN reaction causing immediate rash, facial/tongue/throat swelling, SOB or lightheadedness with hypotension: Yes Has patient had a PCN reaction causing severe rash involving mucus membranes or skin necrosis: No Has patient had a PCN reaction that required hospitalization No Has patient had a PCN reaction occurring within the last 10 years: No If  all of the above answers are "NO", then may proceed with Cephalosporin use.    Family History: Family History  Problem Relation Age of Onset   Polycystic ovary syndrome Mother    Diabetes Father    Bladder Cancer Father    Depression Father    Endometriosis Sister    Polycystic ovary syndrome Sister    Breast cancer Paternal Grandmother    Diabetes Paternal Grandmother    Diabetes Paternal Grandfather    Kidney cancer Neg Hx    Prostate cancer Neg Hx     Social History:  reports that she has never smoked. She has never used smokeless tobacco. She reports that she does not drink alcohol and does not use drugs.   Physical Exam: BP 138/79   Pulse 80   Ht 6\' 1"  (1.854 m)   Wt 232 lb (105.2 kg)   BMI 30.61 kg/m   Constitutional:  She's accompanied by her husband. She looks really uncomfortable. She's writhing back and forth.  HEENT: [place] AT, moist mucus membranes.  Trachea midline, no  masses. Neurologic: Grossly intact, no focal deficits, moving all 4 extremities. Psychiatric: Normal mood and affect.  Results for orders placed or performed in visit on 05/13/22  Microscopic Examination   Urine  Result Value Ref Range   WBC, UA 0-5 0 - 5 /hpf   RBC, Urine 0-2 0 - 2 /hpf   Epithelial Cells (non renal) 0-10 0 - 10 /hpf   Crystals Present (A) N/A   Crystal Type Calcium Oxalate N/A   Mucus, UA Present (A) Not Estab.   Bacteria, UA Few None seen/Few  Urinalysis, Complete  Result Value Ref Range   Specific Gravity, UA 1.020 1.005 - 1.030   pH, UA 6.5 5.0 - 7.5   Color, UA Orange Yellow   Appearance Ur Cloudy (A) Clear   Leukocytes,UA Negative Negative   Protein,UA Trace (A) Negative/Trace   Glucose, UA Negative Negative   Ketones, UA Trace (A) Negative   RBC, UA Negative Negative   Bilirubin, UA Negative Negative   Urobilinogen, Ur 1.0 0.2 - 1.0 mg/dL   Nitrite, UA Negative Negative   Microscopic Examination See below:      Assessment & Plan:    Left flank pain - Low suspicion for infection based on vitals as well as negative urinalysis - Suspect this is likely related to ureteral spasm or ureteral edema. - We'll try an IM dose of torodol 30 mg as well as PO ativan if this fail she needs to go to the emergency room for labs/ imaging -Signs and symptoms of infection reviewed in detail.  F/u as schedule or sooner as needed  I have reviewed the above documentation for accuracy and completeness, and I agree with the above.   07/12/22, MD    [facility] [address], [address] [place], [place] [phone]

## 2022-05-14 ENCOUNTER — Other Ambulatory Visit: Payer: Self-pay

## 2022-05-14 ENCOUNTER — Emergency Department: Payer: BC Managed Care – PPO

## 2022-05-14 ENCOUNTER — Encounter: Payer: Self-pay | Admitting: Urology

## 2022-05-14 ENCOUNTER — Ambulatory Visit (INDEPENDENT_AMBULATORY_CARE_PROVIDER_SITE_OTHER): Payer: BC Managed Care – PPO | Admitting: Urology

## 2022-05-14 ENCOUNTER — Emergency Department
Admission: EM | Admit: 2022-05-14 | Discharge: 2022-05-14 | Disposition: A | Discharge status: Home / Self Care | Payer: BC Managed Care – PPO | Attending: Emergency Medicine | Admitting: Emergency Medicine

## 2022-05-14 VITALS — BP 126/76 | HR 71

## 2022-05-14 DIAGNOSIS — N201 Calculus of ureter: Secondary | ICD-10-CM

## 2022-05-14 DIAGNOSIS — M549 Dorsalgia, unspecified: Secondary | ICD-10-CM | POA: Diagnosis present

## 2022-05-14 DIAGNOSIS — Z1152 Encounter for screening for COVID-19: Secondary | ICD-10-CM | POA: Diagnosis not present

## 2022-05-14 DIAGNOSIS — N23 Unspecified renal colic: Secondary | ICD-10-CM | POA: Diagnosis not present

## 2022-05-14 DIAGNOSIS — N200 Calculus of kidney: Secondary | ICD-10-CM

## 2022-05-14 LAB — RESP PANEL BY RT-PCR (RSV, FLU A&B, COVID)  RVPGX2
Influenza A by PCR: NEGATIVE
Influenza B by PCR: NEGATIVE
Resp Syncytial Virus by PCR: NEGATIVE
SARS Coronavirus 2 by RT PCR: NEGATIVE

## 2022-05-14 LAB — BASIC METABOLIC PANEL
Anion gap: 7 (ref 5–15)
BUN: 18 mg/dL (ref 6–20)
CO2: 20 mmol/L — ABNORMAL LOW (ref 22–32)
Calcium: 8 mg/dL — ABNORMAL LOW (ref 8.9–10.3)
Chloride: 111 mmol/L (ref 98–111)
Creatinine, Ser: 0.89 mg/dL (ref 0.44–1.00)
GFR, Estimated: >60 mL/min (ref >60)
Glucose, Bld: 94 mg/dL (ref 70–99)
Potassium: 3.8 mmol/L (ref 3.5–5.1)
Sodium: 138 mmol/L (ref 135–145)

## 2022-05-14 LAB — CBC WITH DIFFERENTIAL/PLATELET
Abs Immature Granulocytes: 0.02 10*3/uL (ref 0.00–0.07)
Basophils Absolute: 0 10*3/uL (ref 0.0–0.1)
Basophils Relative: 0 %
Eosinophils Absolute: 0.2 10*3/uL (ref 0.0–0.5)
Eosinophils Relative: 3 %
HCT: 25.9 % — ABNORMAL LOW (ref 36.0–46.0)
Hemoglobin: 8.1 g/dL — ABNORMAL LOW (ref 12.0–15.0)
Immature Granulocytes: 0 %
Lymphocytes Relative: 13 %
Lymphs Abs: 1.3 10*3/uL (ref 0.7–4.0)
MCH: 30.7 pg (ref 26.0–34.0)
MCHC: 31.3 g/dL (ref 30.0–36.0)
MCV: 98.1 fL (ref 80.0–100.0)
Monocytes Absolute: 1.1 10*3/uL — ABNORMAL HIGH (ref 0.1–1.0)
Monocytes Relative: 11 %
Neutro Abs: 6.9 10*3/uL (ref 1.7–7.7)
Neutrophils Relative %: 73 %
Platelets: 362 10*3/uL (ref 150–400)
RBC: 2.64 MIL/uL — ABNORMAL LOW (ref 3.87–5.11)
RDW: 16.5 % — ABNORMAL HIGH (ref 11.5–15.5)
WBC: 9.5 10*3/uL (ref 4.0–10.5)
nRBC: 0 % (ref 0.0–0.2)

## 2022-05-14 LAB — URINALYSIS, ROUTINE W REFLEX MICROSCOPIC
Bilirubin Urine: NEGATIVE
Glucose, UA: NEGATIVE mg/dL
Hgb urine dipstick: NEGATIVE
Ketones, ur: NEGATIVE mg/dL
Leukocytes,Ua: NEGATIVE
Nitrite: NEGATIVE
Protein, ur: NEGATIVE mg/dL
Specific Gravity, Urine: 1.021 (ref 1.005–1.030)
pH: 6 (ref 5.0–8.0)

## 2022-05-14 LAB — POC URINE PREG, ED: Preg Test, Ur: NEGATIVE

## 2022-05-14 MED ORDER — LACTATED RINGERS IV BOLUS
1000.0000 mL | Freq: Once | INTRAVENOUS | Status: AC
  Administered 2022-05-14: 1000 mL via INTRAVENOUS

## 2022-05-14 MED ORDER — ACETAMINOPHEN 500 MG PO TABS
1000.0000 mg | ORAL_TABLET | Freq: Once | ORAL | Status: AC
  Administered 2022-05-14: 1000 mg via ORAL
  Filled 2022-05-14: qty 2

## 2022-05-14 MED ORDER — HYDROMORPHONE HCL 1 MG/ML IJ SOLN
1.0000 mg | Freq: Once | INTRAMUSCULAR | Status: AC
  Administered 2022-05-14: 1 mg via INTRAVENOUS
  Filled 2022-05-14: qty 1

## 2022-05-14 MED ORDER — OXYCODONE HCL 5 MG PO TABS: 5.0000 mg | ORAL_TABLET | Freq: Three times a day (TID) | ORAL | 0 refills | Status: DC | PRN

## 2022-05-14 MED ORDER — KETOROLAC TROMETHAMINE 30 MG/ML IJ SOLN
15.0000 mg | Freq: Once | INTRAMUSCULAR | Status: AC
  Administered 2022-05-14: 15 mg via INTRAVENOUS
  Filled 2022-05-14: qty 1

## 2022-05-14 MED ORDER — ONDANSETRON 4 MG PO TBDP: 4.0000 mg | ORAL_TABLET | Freq: Three times a day (TID) | ORAL | 0 refills | Status: DC | PRN

## 2022-05-14 MED ORDER — SODIUM CHLORIDE 0.9 % IV SOLN
1.5000 mg/kg | Freq: Once | INTRAVENOUS | Status: AC
  Administered 2022-05-14: 158 mg via INTRAVENOUS
  Filled 2022-05-14: qty 7.9

## 2022-05-14 MED ORDER — OXYCODONE HCL 5 MG PO TABS
5.0000 mg | ORAL_TABLET | Freq: Once | ORAL | Status: AC
  Administered 2022-05-14: 5 mg via ORAL
  Filled 2022-05-14: qty 1

## 2022-05-14 MED ORDER — KETOROLAC TROMETHAMINE 60 MG/2ML IM SOLN
30.0000 mg | Freq: Once | INTRAMUSCULAR | Status: AC
  Administered 2022-05-14: 30 mg via INTRAMUSCULAR

## 2022-05-14 NOTE — ED Provider Notes (Signed)
Maria Fuller signed out to me pending reassessment of pain.  Maria Fuller with recent stenting now with ongoing pain after stent pulled and CT today showing left ureteral stone with hydro-.  On my assessment Maria Fuller is resting comfortably says her pain is significant improved.  Has been transition to p.o. Percocet.  Tells me she was previously taking Percocet at home but took the last doses yesterday.  Dr. Erlene Quan prescribed her Valium but it does not really seem to be working.  Maria Fuller has history of gastric bypass and is told she is not able to take NSAIDs.  Given this I will prescribe several days of Percocet and I instructed her to stop taking the Valium and stressed the importance of not taking both these medications together due to oversedation risks.  Maria Fuller knows to follow-up with Dr. Erlene Quan.   Rada Hay, MD 05/14/22 [phone]

## 2022-05-14 NOTE — ED Triage Notes (Signed)
Pt presents to ER with c/o left side back pain that started yesterday and has become worse today.  Pt states she had recent lithotripsy with stent placement and had stent removed on Monday.  Pt did f/u with urology, who gave some pain meds, but pt states they have not been consistently helping w/pain.  Pt is otherwise A&O x4 at this time in NAD.

## 2022-05-14 NOTE — Progress Notes (Addendum)
I, Chaya Jan Maxie,acting as a scribe for Vanna Scotland, MD.,have documented all relevant documentation on the behalf of Vanna Scotland, MD,as directed by  Vanna Scotland, MD while in the presence of Vanna Scotland, MD.   05/14/22 4:17 PM   Maria Fuller December 15, 1985 [id number]  Referring provider: Gardiner Coins, PA-C [address] [place],  [place] [address]  Chief Complaint  Patient presents with   Nephrolithiasis    HPI: 37 year-old female returns today to the office after being seen in the emergency room overnight.   She has a large left lower pole stone as well as a left obstructing ureteral stone. Underwent stent placement followed by a staged ureteroscopy. She removed her stent earlier this week. She was seen yesterday in clinic with severe left flank pain.   Her pain was able to be controlled initially with some Toradol and Valium but overnight her pain acutely worsened. She also had another temperature of 101 so she presented to the emergency room.   In the emergency room she had a non-contrast CT scan which showed an 8 mm left mid ureteral stone with hydronephrosis as well as some left lower pole stone debree. There's no evidence of infection. They were able to get her pain under control with IV lidocaine and she was told to follow up in urology clinic.  She still has a lot of pain.  No further fevers or chills.  No nausea or vomiting.  She is anxious to have something done.  PMH: Past Medical History:  Diagnosis Date   Anemia    slight   Bradycardia    Depression    Diabetes mellitus without complication (HCC)    resolved with weight loss from gastric bypass   Headache    migraines. resolved since gastric bypass   High cholesterol    resolved since weight loss surgery   History of kidney stones    last week   Hypertension    resolved since gastric bypass, severe pre-eclampsia with G1   Low BP    Meningitis 2015   PCOS (polycystic ovarian syndrome)     Preterm labor    Sleep apnea    had gastric bypass surgery and now resolved   Syncope    Vertigo couple years ago    Surgical History: Past Surgical History:  Procedure Laterality Date   CESAREAN SECTION     CESAREAN SECTION N/A 05/07/2016   Procedure: CESAREAN SECTION;  Surgeon: Nadara Mustard, MD;  Location: [facility];  Service: Obstetrics;  Laterality: N/A;  Time of birth: [birth year] Sex: Female Weight: 3940 kg, 8 lb 11 oz   CHOLECYSTECTOMY     CYSTOSCOPY WITH STENT PLACEMENT Right 09/19/2016   Procedure: CYSTOSCOPY WITH STENT PLACEMENT;  Surgeon: Hildred Laser, MD;  Location: [facility];  Service: Urology;  Laterality: Right;   CYSTOSCOPY WITH STENT PLACEMENT Left 04/18/2022   Procedure: CYSTOSCOPY WITH STENT PLACEMENT;  Surgeon: Bjorn Pippin, MD;  Location: [facility];  Service: Urology;  Laterality: Left;   CYSTOSCOPY/URETEROSCOPY/HOLMIUM LASER/STENT PLACEMENT Left 05/08/2022   Procedure: CYSTOSCOPY/URETEROSCOPY/HOLMIUM LASER/STENT EXCHANGE;  Surgeon: Vanna Scotland, MD;  Location: [facility];  Service: Urology;  Laterality: Left;   FLEXIBLE SIGMOIDOSCOPY N/A 07/28/2019   Procedure: FLEXIBLE SIGMOIDOSCOPY;  Surgeon: Toney Reil, MD;  Location: [facility];  Service: Endoscopy;  Laterality: N/A;   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   LAPAROSCOPIC OVARIAN CYSTECTOMY Right 09/28/2018   Procedure: LAPAROSCOPIC RIGHT OVARIAN CYSTECTOMY;  Surgeon:  Harris, Robert P, MD;  Location: [facility];  Service: Gynecology;  Laterality: Right;   LAPAROSCOPIC UNILATERAL SALPINGO OOPHERECTOMY Right 07/25/2021   Procedure: LAPAROSCOPIC UNILATERAL SALPINGO OOPHORECTOMY;  Surgeon: Harris, Robert P, MD;  Location: [facility];  Service: Gynecology;  Laterality: Right;   LEFT HEART CATH AND CORONARY ANGIOGRAPHY Left 04/07/2018   Procedure: LEFT HEART CATH AND CORONARY ANGIOGRAPHY;  Surgeon: Callwood, Dwayne D, MD;  Location: [facility];  Service: Cardiovascular;  Laterality: Left;   small  bowel obstruction  07/2019   TONSILLECTOMY     URETEROSCOPY WITH HOLMIUM LASER LITHOTRIPSY Right 09/19/2016   Procedure: URETEROSCOPY WITH HOLMIUM LASER LITHOTRIPSY;  Surgeon: Budzyn, Brian James, MD;  Location: [facility];  Service: Urology;  Laterality: Right;   XI ROBOTIC ASSISTED VENTRAL HERNIA N/A 07/27/2021   Procedure: XI ROBOTIC ASSISTED INCARCERATED INCISIONAL HERNIA REPAIR;  Surgeon: Rodenberg, Denny, MD;  Location: [facility];  Service: General;  Laterality: N/A;    Home Medications:  Allergies as of 05/14/2022       Reactions   Compazine [prochlorperazine] Other (See Comments)   Panic attack   Ceftin [cefuroxime Axetil] Rash   Tolerated cefazolin before   Penicillins Rash   TOLERATED CEFAZOLIN BEFORE Has patient had a PCN reaction causing immediate rash, facial/tongue/throat swelling, SOB or lightheadedness with hypotension: Yes Has patient had a PCN reaction causing severe rash involving mucus membranes or skin necrosis: No Has patient had a PCN reaction that required hospitalization No Has patient had a PCN reaction occurring within the last 10 years: No If all of the above answers are "NO", then may proceed with Cephalosporin use.        Medication List        Accurate as of May 14, 2022  4:17 PM. If you have any questions, ask your nurse or doctor.          STOP taking these medications    diazepam 10 MG tablet Commonly known as: Valium Stopped by: Julane Crock, MD   oxyCODONE-acetaminophen 5-325 MG tablet Commonly known as: Percocet Stopped by: Ritchard Paragas, MD       TAKE these medications    buPROPion 150 MG 24 hr tablet Commonly known as: WELLBUTRIN XL Take 150 mg by mouth every morning.   dicyclomine 20 MG tablet Commonly known as: BENTYL Take 20 mg by mouth as needed.   hyoscyamine 0.125 MG/ML solution Commonly known as: LEVSIN Take 0.125 mg by mouth 3 (three) times daily.   levonorgestrel 20 MCG/24HR IUD Commonly known as:  MIRENA 1 each by Intrauterine route once.   ondansetron 4 MG disintegrating tablet Commonly known as: ZOFRAN-ODT Take 1 tablet (4 mg total) by mouth every 8 (eight) hours as needed.   oxybutynin 5 MG tablet Commonly known as: DITROPAN Take 1 tablet (5 mg total) by mouth every 8 (eight) hours as needed for bladder spasms.   oxyCODONE 5 MG immediate release tablet Commonly known as: Roxicodone Take 1 tablet (5 mg total) by mouth every 8 (eight) hours as needed.   pantoprazole 40 MG tablet Commonly known as: PROTONIX Take 40 mg by mouth every morning.   sertraline 100 MG tablet Commonly known as: ZOLOFT Take 100 mg by mouth 2 (two) times daily.   tamsulosin 0.4 MG Caps capsule Commonly known as: FLOMAX Take 1 capsule (0.4 mg total) by mouth daily.   traZODone 50 MG tablet Commonly known as: DESYREL Take 50 mg by mouth at bedtime.          Allergies:  Allergies  Allergen Reactions   Compazine [Prochlorperazine] Other (See Comments)    Panic attack   Ceftin [Cefuroxime Axetil] Rash    Tolerated cefazolin before   Penicillins Rash    TOLERATED CEFAZOLIN BEFORE Has patient had a PCN reaction causing immediate rash, facial/tongue/throat swelling, SOB or lightheadedness with hypotension: Yes Has patient had a PCN reaction causing severe rash involving mucus membranes or skin necrosis: No Has patient had a PCN reaction that required hospitalization No Has patient had a PCN reaction occurring within the last 10 years: No If all of the above answers are "NO", then may proceed with Cephalosporin use.    Family History: Family History  Problem Relation Age of Onset   Polycystic ovary syndrome Mother    Diabetes Father    Bladder Cancer Father    Depression Father    Endometriosis Sister    Polycystic ovary syndrome Sister    Breast cancer Paternal Grandmother    Diabetes Paternal Grandmother    Diabetes Paternal Grandfather    Kidney cancer Neg Hx    Prostate cancer  Neg Hx     Social History:  reports that she has never smoked. She has never used smokeless tobacco. She reports that she does not drink alcohol and does not use drugs.   Physical Exam: BP 126/76   Pulse 71   Constitutional:  Alert and oriented, No acute distress. HEENT: [place] AT, moist mucus membranes.  Trachea midline, no masses. Neurologic: Grossly intact, no focal deficits, moving all 4 extremities. Psychiatric: Normal mood and affect.  Laboratory Data: Lab Results  Component Value Date   WBC 9.5 05/14/2022   HGB 8.1 (L) 05/14/2022   HCT 25.9 (L) 05/14/2022   MCV 98.1 05/14/2022   PLT 362 05/14/2022    Lab Results  Component Value Date   CREATININE 0.89 05/14/2022    Urinalysis    Component Value Date/Time   COLORURINE YELLOW (A) 05/14/2022 0039   APPEARANCEUR HAZY (A) 05/14/2022 0039   APPEARANCEUR Cloudy (A) 05/13/2022 1051   LABSPEC 1.021 05/14/2022 0039   LABSPEC 1.033 02/08/2013 0027   PHURINE 6.0 05/14/2022 0039   GLUCOSEU NEGATIVE 05/14/2022 0039   GLUCOSEU >=500 02/08/2013 0027   HGBUR NEGATIVE 05/14/2022 0039   BILIRUBINUR NEGATIVE 05/14/2022 0039   BILIRUBINUR Negative 05/13/2022 1051   BILIRUBINUR Negative 02/08/2013 0027   KETONESUR NEGATIVE 05/14/2022 0039   PROTEINUR NEGATIVE 05/14/2022 0039   UROBILINOGEN 1.0 06/26/2014 1405   NITRITE NEGATIVE 05/14/2022 0039   LEUKOCYTESUR NEGATIVE 05/14/2022 0039   LEUKOCYTESUR Negative 02/08/2013 0027    Lab Results  Component Value Date   LABMICR See below: 05/13/2022   WBCUA 0-5 05/13/2022   RBCUA 3-10 (A) 02/12/2017   LABEPIT 0-10 05/13/2022   MUCUS Present (A) 05/13/2022   BACTERIA RARE (A) 05/14/2022   Pertinent Imaging:  Results for orders placed during the hospital encounter of 05/14/22  CT Renal Stone Study  Narrative CLINICAL DATA:  Left flank pain. Recent lithotripsy. Ureteral stent removed 2 days ago.  EXAM: CT ABDOMEN AND PELVIS WITHOUT CONTRAST  TECHNIQUE: Multidetector CT  imaging of the abdomen and pelvis was performed following the standard protocol without IV contrast.  RADIATION DOSE REDUCTION: This exam was performed according to the departmental dose-optimization program which includes automated exposure control, adjustment of the mA and/or kV according to patient size and/or use of iterative reconstruction technique.  COMPARISON:  Multiple prior CTs dating back to 2010. The 3 most recent are CTs without contrast  of 04/16/2022 and 02/16/2022, and CT with contrast 01/20/2022.  FINDINGS: Lower chest: No acute abnormality. Again the intracardiac blood pool is low in density which may be seen with anemia. The cardiac size is normal. There is no pericardial effusion.  Hepatobiliary: The liver is 21 cm length mildly steatotic. No focal abnormality is seen without contrast. Surgically absent gallbladder with mild chronic intrahepatic and extrahepatic biliary prominence.  Pancreas: No abnormality is seen without contrast.  Spleen: No abnormality is seen without contrast.  No splenomegaly.  Adrenals/Urinary Tract: There is no adrenal mass. On the left there is an ovoid stone in the inferior pole collecting system measuring 1.5 by 0.6 cm, previously 2.2 x 1.2 cm.  There is increased left perinephric stranding, mild but increased left perinephric fluid.  There previously was a solitary 6 mm proximal left ureteral stone, level of L2-3. Currently there is an 8 x 5 x 4 mm mid left ureteral stone, superimposing just inferior to the mid left L4 transverse process. There is moderate hydroureteronephrosis above this.  In the distal left ureter just proximal to the UVJ, there are least 2, possibly 3 tandem stones measuring 2-3 mm, and just distal to these stones a 4 mm stone is seen.  Both ureters are otherwise clear. The bladder unremarkable for the degree of distention. There are no visible intravesical stones.  Urothelial thickening in the left renal  pelvis and proximal ureter may have an infectious basis and clinical correlation is advised.  There is no visible right nephrolithiasis. The unenhanced renal cortex is unremarkable.  Stomach/Bowel: Old sleeve gastrectomy changes. There is a postsurgical small bowel segment in the left lower abdomen.  Additional small bowel surgical change right mid abdomen. No small bowel obstruction or inflammation is seen.  There is colonic interposition of the hepatic flexure between the posterior liver and posterior diaphragmatic dome. The appendix is normal caliber.  Moderate fecal stasis again noted throughout the large bowel without evidence colitis or diverticulitis.  Vascular/Lymphatic: No significant vascular findings are present. No enlarged abdominal or pelvic lymph nodes.  Reproductive: Retroverted uterus with IUD.  No adnexal masses seen.  Other: Minimal focal cul-de-sac fluid, probably physiologic given age. There is no free hemorrhage, free air or acute inflammatory changes. There are no incarcerated hernias.  Musculoskeletal: There are chronic L5 pars defects and grade 1 L5-S1 spondylolisthesis with partial disc space loss. 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 No acute or other significant osseous findings.  IMPRESSION: 1. 8 x 5 x 4 mm mid left ureteral stone with moderate hydroureteronephrosis above this level. 2. There are at least 2, possibly 3 tandem 2-3 mm stones in the distal left ureter just proximal to the UVJ, and a 4 mm stone just distal to these stones. 3. Urothelial thickening in the left renal pelvis and proximal ureter which may have an infectious basis and clinical correlation is advised. 4. 1.5 x 0.6 cm stone in the inferior pole collecting system of the left kidney, previously 2.2 x 1.2 cm. 5. Constipation and diverticulosis. 6. Chronic L5 pars defects with grade 1 L5-S1 spondylolisthesis and moderate to severe L5-S1 vertical  foraminal stenosis. 7. Mildly prominent liver with mild steatosis. 8. Low density in the intracardiac blood pool which may be seen with anemia.   Electronically Signed By: Keith  Chesser M.D. On: 05/14/2022 02:19  Personally reviewed and agree with radiologic interpretation.   Assessment & Plan:    Left ureteral stone - Obstructing fragment, likely too large   to pass, 8 mm in mid ureter. Offered ureteroscopy versus ESWL.   We discussed various treatment options for urolithiasis including observation with or without medical expulsive therapy, shockwave lithotripsy (SWL), ureteroscopy and laser lithotripsy with stent placement, and percutaneous nephrolithotomy.   We discussed that management is based on stone size, location, density, patient co-morbidities, and patient preference.    Stones <57mm in size have a >80% spontaneous passage rate. Data surrounding the use of tamsulosin for medical expulsive therapy is controversial, but meta analyses suggests it is most efficacious for distal stones between 5-67mm in size. Possible side effects include dizziness/lightheadedness, and retrograde ejaculation.   SWL has a lower stone free rate in a single procedure, but also a lower complication rate compared to ureteroscopy and avoids a stent and associated stent related symptoms. Possible complications include renal hematoma, steinstrasse, and need for additional treatment. We discussed the role of his increased skin to stone distance can lead to decreased efficacy with shockwave lithotripsy.   Ureteroscopy with laser lithotripsy and stent placement has a higher stone free rate than SWL in a single procedure, however increased complication rate including possible infection, ureteral injury, bleeding, and stent related morbidity. Common stent related symptoms include dysuria, urgency/frequency, and flank pain.   After an extensive discussion of the risks and benefits of the above treatment options, the  patient would like to proceed with ESWL  - She was given a Toradol 30 mg im injection for pain   I have reviewed the above documentation for accuracy and completeness, and I agree with the above.   [name], MD   Return for after surgery.   [facility] [address], [place], [place] [address] [phone]

## 2022-05-14 NOTE — Progress Notes (Signed)
ESWL ORDER FORM  Expected date of procedure: 05/15/2022  Surgeon: [name], MD  Post op standing: 2-4wk follow up w/KUB prior  Anticoagulation/Aspirin/NSAID standing order: Hold all 72 hours prior  Anesthesia standing order: MAC  VTE standing: SCD's  Dx: Left Ureteral Stone  Procedure: left Extracorporeal shock wave lithotripsy  CPT : 35686  Standing Order Set:   *NPO after mn, KUB  *NS 122m/hr, Keflex 5050mPO, Benadryl 2550mO, Valium 78m23m, Zofran 4mg 31m   Medications if other than standing orders:   NONE

## 2022-05-14 NOTE — H&P (View-Only) (Signed)
I, Chaya Jan Maxie,acting as a scribe for Vanna Scotland, MD.,have documented all relevant documentation on the behalf of Vanna Scotland, MD,as directed by  Vanna Scotland, MD while in the presence of Vanna Scotland, MD.   05/14/22 4:17 PM   Sabino Dick Shreiner December 15, 1985 [id number]  Referring provider: Gardiner Coins, PA-C [address] [place],  [place] [address]  Chief Complaint  Patient presents with   Nephrolithiasis    HPI: 37 year-old female returns today to the office after being seen in the emergency room overnight.   She has a large left lower pole stone as well as a left obstructing ureteral stone. Underwent stent placement followed by a staged ureteroscopy. She removed her stent earlier this week. She was seen yesterday in clinic with severe left flank pain.   Her pain was able to be controlled initially with some Toradol and Valium but overnight her pain acutely worsened. She also had another temperature of 101 so she presented to the emergency room.   In the emergency room she had a non-contrast CT scan which showed an 8 mm left mid ureteral stone with hydronephrosis as well as some left lower pole stone debree. There's no evidence of infection. They were able to get her pain under control with IV lidocaine and she was told to follow up in urology clinic.  She still has a lot of pain.  No further fevers or chills.  No nausea or vomiting.  She is anxious to have something done.  PMH: Past Medical History:  Diagnosis Date   Anemia    slight   Bradycardia    Depression    Diabetes mellitus without complication (HCC)    resolved with weight loss from gastric bypass   Headache    migraines. resolved since gastric bypass   High cholesterol    resolved since weight loss surgery   History of kidney stones    last week   Hypertension    resolved since gastric bypass, severe pre-eclampsia with G1   Low BP    Meningitis 2015   PCOS (polycystic ovarian syndrome)     Preterm labor    Sleep apnea    had gastric bypass surgery and now resolved   Syncope    Vertigo couple years ago    Surgical History: Past Surgical History:  Procedure Laterality Date   CESAREAN SECTION     CESAREAN SECTION N/A 05/07/2016   Procedure: CESAREAN SECTION;  Surgeon: Nadara Mustard, MD;  Location: [facility];  Service: Obstetrics;  Laterality: N/A;  Time of birth: [birth year] Sex: Female Weight: 3940 kg, 8 lb 11 oz   CHOLECYSTECTOMY     CYSTOSCOPY WITH STENT PLACEMENT Right 09/19/2016   Procedure: CYSTOSCOPY WITH STENT PLACEMENT;  Surgeon: Hildred Laser, MD;  Location: [facility];  Service: Urology;  Laterality: Right;   CYSTOSCOPY WITH STENT PLACEMENT Left 04/18/2022   Procedure: CYSTOSCOPY WITH STENT PLACEMENT;  Surgeon: Bjorn Pippin, MD;  Location: [facility];  Service: Urology;  Laterality: Left;   CYSTOSCOPY/URETEROSCOPY/HOLMIUM LASER/STENT PLACEMENT Left 05/08/2022   Procedure: CYSTOSCOPY/URETEROSCOPY/HOLMIUM LASER/STENT EXCHANGE;  Surgeon: Vanna Scotland, MD;  Location: [facility];  Service: Urology;  Laterality: Left;   FLEXIBLE SIGMOIDOSCOPY N/A 07/28/2019   Procedure: FLEXIBLE SIGMOIDOSCOPY;  Surgeon: Toney Reil, MD;  Location: [facility];  Service: Endoscopy;  Laterality: N/A;   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   LAPAROSCOPIC OVARIAN CYSTECTOMY Right 09/28/2018   Procedure: LAPAROSCOPIC RIGHT OVARIAN CYSTECTOMY;  Surgeon:  Nadara Mustard, MD;  Location: [facility];  Service: Gynecology;  Laterality: Right;   LAPAROSCOPIC UNILATERAL SALPINGO OOPHERECTOMY Right 07/25/2021   Procedure: LAPAROSCOPIC UNILATERAL SALPINGO OOPHORECTOMY;  Surgeon: Nadara Mustard, MD;  Location: [facility];  Service: Gynecology;  Laterality: Right;   LEFT HEART CATH AND CORONARY ANGIOGRAPHY Left 04/07/2018   Procedure: LEFT HEART CATH AND CORONARY ANGIOGRAPHY;  Surgeon: Alwyn Pea, MD;  Location: [facility];  Service: Cardiovascular;  Laterality: Left;   small  bowel obstruction  07/2019   TONSILLECTOMY     URETEROSCOPY WITH HOLMIUM LASER LITHOTRIPSY Right 09/19/2016   Procedure: URETEROSCOPY WITH HOLMIUM LASER LITHOTRIPSY;  Surgeon: Hildred Laser, MD;  Location: [facility];  Service: Urology;  Laterality: Right;   XI ROBOTIC ASSISTED VENTRAL HERNIA N/A 07/27/2021   Procedure: XI ROBOTIC ASSISTED INCARCERATED INCISIONAL HERNIA REPAIR;  Surgeon: Campbell Lerner, MD;  Location: [facility];  Service: General;  Laterality: N/A;    Home Medications:  Allergies as of 05/14/2022       Reactions   Compazine [prochlorperazine] Other (See Comments)   Panic attack   Ceftin [cefuroxime Axetil] Rash   Tolerated cefazolin before   Penicillins Rash   TOLERATED CEFAZOLIN BEFORE Has patient had a PCN reaction causing immediate rash, facial/tongue/throat swelling, SOB or lightheadedness with hypotension: Yes Has patient had a PCN reaction causing severe rash involving mucus membranes or skin necrosis: No Has patient had a PCN reaction that required hospitalization No Has patient had a PCN reaction occurring within the last 10 years: No If all of the above answers are "NO", then may proceed with Cephalosporin use.        Medication List        Accurate as of May 14, 2022  4:17 PM. If you have any questions, ask your nurse or doctor.          STOP taking these medications    diazepam 10 MG tablet Commonly known as: Valium Stopped by: Vanna Scotland, MD   oxyCODONE-acetaminophen 5-325 MG tablet Commonly known as: Percocet Stopped by: Vanna Scotland, MD       TAKE these medications    buPROPion 150 MG 24 hr tablet Commonly known as: WELLBUTRIN XL Take 150 mg by mouth every morning.   dicyclomine 20 MG tablet Commonly known as: BENTYL Take 20 mg by mouth as needed.   hyoscyamine 0.125 MG/ML solution Commonly known as: LEVSIN Take 0.125 mg by mouth 3 (three) times daily.   levonorgestrel 20 MCG/24HR IUD Commonly known as:  MIRENA 1 each by Intrauterine route once.   ondansetron 4 MG disintegrating tablet Commonly known as: ZOFRAN-ODT Take 1 tablet (4 mg total) by mouth every 8 (eight) hours as needed.   oxybutynin 5 MG tablet Commonly known as: DITROPAN Take 1 tablet (5 mg total) by mouth every 8 (eight) hours as needed for bladder spasms.   oxyCODONE 5 MG immediate release tablet Commonly known as: Roxicodone Take 1 tablet (5 mg total) by mouth every 8 (eight) hours as needed.   pantoprazole 40 MG tablet Commonly known as: PROTONIX Take 40 mg by mouth every morning.   sertraline 100 MG tablet Commonly known as: ZOLOFT Take 100 mg by mouth 2 (two) times daily.   tamsulosin 0.4 MG Caps capsule Commonly known as: FLOMAX Take 1 capsule (0.4 mg total) by mouth daily.   traZODone 50 MG tablet Commonly known as: DESYREL Take 50 mg by mouth at bedtime.  Allergies:  Allergies  Allergen Reactions   Compazine [Prochlorperazine] Other (See Comments)    Panic attack   Ceftin [Cefuroxime Axetil] Rash    Tolerated cefazolin before   Penicillins Rash    TOLERATED CEFAZOLIN BEFORE Has patient had a PCN reaction causing immediate rash, facial/tongue/throat swelling, SOB or lightheadedness with hypotension: Yes Has patient had a PCN reaction causing severe rash involving mucus membranes or skin necrosis: No Has patient had a PCN reaction that required hospitalization No Has patient had a PCN reaction occurring within the last 10 years: No If all of the above answers are "NO", then may proceed with Cephalosporin use.    Family History: Family History  Problem Relation Age of Onset   Polycystic ovary syndrome Mother    Diabetes Father    Bladder Cancer Father    Depression Father    Endometriosis Sister    Polycystic ovary syndrome Sister    Breast cancer Paternal Grandmother    Diabetes Paternal Grandmother    Diabetes Paternal Grandfather    Kidney cancer Neg Hx    Prostate cancer  Neg Hx     Social History:  reports that she has never smoked. She has never used smokeless tobacco. She reports that she does not drink alcohol and does not use drugs.   Physical Exam: BP 126/76   Pulse 71   Constitutional:  Alert and oriented, No acute distress. HEENT: [place] AT, moist mucus membranes.  Trachea midline, no masses. Neurologic: Grossly intact, no focal deficits, moving all 4 extremities. Psychiatric: Normal mood and affect.  Laboratory Data: Lab Results  Component Value Date   WBC 9.5 05/14/2022   HGB 8.1 (L) 05/14/2022   HCT 25.9 (L) 05/14/2022   MCV 98.1 05/14/2022   PLT 362 05/14/2022    Lab Results  Component Value Date   CREATININE 0.89 05/14/2022    Urinalysis    Component Value Date/Time   COLORURINE YELLOW (A) 05/14/2022 0039   APPEARANCEUR HAZY (A) 05/14/2022 0039   APPEARANCEUR Cloudy (A) 05/13/2022 1051   LABSPEC 1.021 05/14/2022 0039   LABSPEC 1.033 02/08/2013 0027   PHURINE 6.0 05/14/2022 0039   GLUCOSEU NEGATIVE 05/14/2022 0039   GLUCOSEU >=500 02/08/2013 0027   HGBUR NEGATIVE 05/14/2022 0039   BILIRUBINUR NEGATIVE 05/14/2022 0039   BILIRUBINUR Negative 05/13/2022 1051   BILIRUBINUR Negative 02/08/2013 0027   KETONESUR NEGATIVE 05/14/2022 0039   PROTEINUR NEGATIVE 05/14/2022 0039   UROBILINOGEN 1.0 06/26/2014 1405   NITRITE NEGATIVE 05/14/2022 0039   LEUKOCYTESUR NEGATIVE 05/14/2022 0039   LEUKOCYTESUR Negative 02/08/2013 0027    Lab Results  Component Value Date   LABMICR See below: 05/13/2022   WBCUA 0-5 05/13/2022   RBCUA 3-10 (A) 02/12/2017   LABEPIT 0-10 05/13/2022   MUCUS Present (A) 05/13/2022   BACTERIA RARE (A) 05/14/2022   Pertinent Imaging:  Results for orders placed during the hospital encounter of 05/14/22  CT Renal Stone Study  Narrative CLINICAL DATA:  Left flank pain. Recent lithotripsy. Ureteral stent removed 2 days ago.  EXAM: CT ABDOMEN AND PELVIS WITHOUT CONTRAST  TECHNIQUE: Multidetector CT  imaging of the abdomen and pelvis was performed following the standard protocol without IV contrast.  RADIATION DOSE REDUCTION: This exam was performed according to the departmental dose-optimization program which includes automated exposure control, adjustment of the mA and/or kV according to patient size and/or use of iterative reconstruction technique.  COMPARISON:  Multiple prior CTs dating back to 2010. The 3 most recent are CTs without contrast  of 04/16/2022 and 02/16/2022, and CT with contrast 01/20/2022.  FINDINGS: Lower chest: No acute abnormality. Again the intracardiac blood pool is low in density which may be seen with anemia. The cardiac size is normal. There is no pericardial effusion.  Hepatobiliary: The liver is 21 cm length mildly steatotic. No focal abnormality is seen without contrast. Surgically absent gallbladder with mild chronic intrahepatic and extrahepatic biliary prominence.  Pancreas: No abnormality is seen without contrast.  Spleen: No abnormality is seen without contrast.  No splenomegaly.  Adrenals/Urinary Tract: There is no adrenal mass. On the left there is an ovoid stone in the inferior pole collecting system measuring 1.5 by 0.6 cm, previously 2.2 x 1.2 cm.  There is increased left perinephric stranding, mild but increased left perinephric fluid.  There previously was a solitary 6 mm proximal left ureteral stone, level of L2-3. Currently there is an 8 x 5 x 4 mm mid left ureteral stone, superimposing just inferior to the mid left L4 transverse process. There is moderate hydroureteronephrosis above this.  In the distal left ureter just proximal to the UVJ, there are least 2, possibly 3 tandem stones measuring 2-3 mm, and just distal to these stones a 4 mm stone is seen.  Both ureters are otherwise clear. The bladder unremarkable for the degree of distention. There are no visible intravesical stones.  Urothelial thickening in the left renal  pelvis and proximal ureter may have an infectious basis and clinical correlation is advised.  There is no visible right nephrolithiasis. The unenhanced renal cortex is unremarkable.  Stomach/Bowel: Old sleeve gastrectomy changes. There is a postsurgical small bowel segment in the left lower abdomen.  Additional small bowel surgical change right mid abdomen. No small bowel obstruction or inflammation is seen.  There is colonic interposition of the hepatic flexure between the posterior liver and posterior diaphragmatic dome. The appendix is normal caliber.  Moderate fecal stasis again noted throughout the large bowel without evidence colitis or diverticulitis.  Vascular/Lymphatic: No significant vascular findings are present. No enlarged abdominal or pelvic lymph nodes.  Reproductive: Retroverted uterus with IUD.  No adnexal masses seen.  Other: Minimal focal cul-de-sac fluid, probably physiologic given age. There is no free hemorrhage, free air or acute inflammatory changes. There are no incarcerated hernias.  Musculoskeletal: There are chronic L5 pars defects and grade 1 L5-S1 spondylolisthesis with partial disc space loss. 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 No acute or other significant osseous findings.  IMPRESSION: 1. 8 x 5 x 4 mm mid left ureteral stone with moderate hydroureteronephrosis above this level. 2. There are at least 2, possibly 3 tandem 2-3 mm stones in the distal left ureter just proximal to the UVJ, and a 4 mm stone just distal to these stones. 3. Urothelial thickening in the left renal pelvis and proximal ureter which may have an infectious basis and clinical correlation is advised. 4. 1.5 x 0.6 cm stone in the inferior pole collecting system of the left kidney, previously 2.2 x 1.2 cm. 5. Constipation and diverticulosis. 6. Chronic L5 pars defects with grade 1 L5-S1 spondylolisthesis and moderate to severe L5-S1 vertical  foraminal stenosis. 7. Mildly prominent liver with mild steatosis. 8. Low density in the intracardiac blood pool which may be seen with anemia.   Electronically Signed By: Almira Bar M.D. On: 05/14/2022 02:19  Personally reviewed and agree with radiologic interpretation.   Assessment & Plan:    Left ureteral stone - Obstructing fragment, likely too large  to pass, 8 mm in mid ureter. Offered ureteroscopy versus ESWL.   We discussed various treatment options for urolithiasis including observation with or without medical expulsive therapy, shockwave lithotripsy (SWL), ureteroscopy and laser lithotripsy with stent placement, and percutaneous nephrolithotomy.   We discussed that management is based on stone size, location, density, patient co-morbidities, and patient preference.    Stones <57mm in size have a >80% spontaneous passage rate. Data surrounding the use of tamsulosin for medical expulsive therapy is controversial, but meta analyses suggests it is most efficacious for distal stones between 5-67mm in size. Possible side effects include dizziness/lightheadedness, and retrograde ejaculation.   SWL has a lower stone free rate in a single procedure, but also a lower complication rate compared to ureteroscopy and avoids a stent and associated stent related symptoms. Possible complications include renal hematoma, steinstrasse, and need for additional treatment. We discussed the role of his increased skin to stone distance can lead to decreased efficacy with shockwave lithotripsy.   Ureteroscopy with laser lithotripsy and stent placement has a higher stone free rate than SWL in a single procedure, however increased complication rate including possible infection, ureteral injury, bleeding, and stent related morbidity. Common stent related symptoms include dysuria, urgency/frequency, and flank pain.   After an extensive discussion of the risks and benefits of the above treatment options, the  patient would like to proceed with ESWL  - She was given a Toradol 30 mg im injection for pain   I have reviewed the above documentation for accuracy and completeness, and I agree with the above.   [name], MD   Return for after surgery.   [facility] [address], [place], [place] [address] [phone]

## 2022-05-14 NOTE — ED Provider Notes (Signed)
[facility] Provider Note    Event Date/Time   First MD Initiated Contact with Patient 05/14/22 [phone]     (approximate)   History   Back Pain   HPI  Maria Fuller is a 37 y.o. female who presents to the ED for evaluation of Back Pain   I review 1/4 outpatient operative note with Dr. Erlene Quan for left ureteral calculus.  Ureteroscopy, laser lithotripsy and left ureteral stent exchange performed.  Looks like this was a staged procedure after a left ureteral stent was placed on 12/15 by Dr. Jeffie Pollock.  Of note, she is s/p sleeve gastrectomy remotely.  Patient removed her own ureteral stent at the direction of her urologist on 1/8 and she reports this went well without pain.  She went about 24 hours without pain into the morning of 1/9 when she developed sudden left flank pain.  She called the office of Dr. Erlene Quan, was seen in clinic and got a shot of Toradol and some Valium with some minimal and transient improvement of her symptoms.  Due to continued worsening and severe pain she presents to the ED.  Of note, does report that she had fever of 101 F earlier today, but no dysuria, cough, headache, syncope or other symptoms beyond typical ureteral colic that she is accustomed to.   Physical Exam   Triage Vital Signs: ED Triage Vitals  Enc Vitals Group     BP 05/14/22 0036 125/65     Pulse Rate 05/14/22 0036 81     Resp 05/14/22 0036 18     Temp 05/14/22 0036 98.8 F (37.1 C)     Temp Source 05/14/22 0036 Oral     SpO2 05/14/22 0036 95 %     Weight --      Height --      Head Circumference --      Peak Flow --      Pain Score 05/14/22 0039 7     Pain Loc --      Pain Edu? --      Excl. in [place]? --     Most recent vital signs: Vitals:   05/14/22 0528 05/14/22 0630  BP: (!) 104/52 111/72  Pulse: 60 (!) 54  Resp: 16 15  Temp:    SpO2: 96% 94%    General: Awake, no distress.  CV:  Good peripheral perfusion.  Resp:  Normal effort.  Abd:  No  distention.  MSK:  No deformity noted.  Neuro:  No focal deficits appreciated. Other:     ED Results / Procedures / Treatments   Labs (all labs ordered are listed, but only abnormal results are displayed) Labs Reviewed  CBC WITH DIFFERENTIAL/PLATELET - Abnormal; Notable for the following components:      Result Value   RBC 2.64 (*)    Hemoglobin 8.1 (*)    HCT 25.9 (*)    RDW 16.5 (*)    Monocytes Absolute 1.1 (*)    All other components within normal limits  BASIC METABOLIC PANEL - Abnormal; Notable for the following components:   CO2 20 (*)    Calcium 8.0 (*)    All other components within normal limits  URINALYSIS, ROUTINE W REFLEX MICROSCOPIC - Abnormal; Notable for the following components:   Color, Urine YELLOW (*)    APPearance HAZY (*)    Bacteria, UA RARE (*)    All other components within normal limits  RESP PANEL BY RT-PCR (RSV, FLU A&B, COVID)  RVPGX2  POC URINE PREG, ED    EKG   RADIOLOGY CT renal study interpreted by me with mid ureteral stone in the left of moderate size that is obstructing  Official radiology report(s): CT Renal Stone Study  Result Date: 05/14/2022 CLINICAL DATA:  Left flank pain. Recent lithotripsy. Ureteral stent removed 2 days ago. EXAM: CT ABDOMEN AND PELVIS WITHOUT CONTRAST TECHNIQUE: Multidetector CT imaging of the abdomen and pelvis was performed following the standard protocol without IV contrast. RADIATION DOSE REDUCTION: This exam was performed according to the departmental dose-optimization program which includes automated exposure control, adjustment of the mA and/or kV according to patient size and/or use of iterative reconstruction technique. COMPARISON:  Multiple prior CTs dating back to 2010. The 3 most recent are CTs without contrast of 04/16/2022 and 02/16/2022, and CT with contrast 01/20/2022. FINDINGS: Lower chest: No acute abnormality. Again the intracardiac blood pool is low in density which may be seen with anemia. The  cardiac size is normal. There is no pericardial effusion. Hepatobiliary: The liver is 21 cm length mildly steatotic. No focal abnormality is seen without contrast. Surgically absent gallbladder with mild chronic intrahepatic and extrahepatic biliary prominence. Pancreas: No abnormality is seen without contrast. Spleen: No abnormality is seen without contrast.  No splenomegaly. Adrenals/Urinary Tract: There is no adrenal mass. On the left there is an ovoid stone in the inferior pole collecting system measuring 1.5 by 0.6 cm, previously 2.2 x 1.2 cm. There is increased left perinephric stranding, mild but increased left perinephric fluid. There previously was a solitary 6 mm proximal left ureteral stone, level of L2-3. Currently there is an 8 x 5 x 4 mm mid left ureteral stone, superimposing just inferior to the mid left L4 transverse process. There is moderate hydroureteronephrosis above this. In the distal left ureter just proximal to the UVJ, there are least 2, possibly 3 tandem stones measuring 2-3 mm, and just distal to these stones a 4 mm stone is seen. Both ureters are otherwise clear. The bladder unremarkable for the degree of distention. There are no visible intravesical stones. Urothelial thickening in the left renal pelvis and proximal ureter may have an infectious basis and clinical correlation is advised. There is no visible right nephrolithiasis. The unenhanced renal cortex is unremarkable. Stomach/Bowel: Old sleeve gastrectomy changes. There is a postsurgical small bowel segment in the left lower abdomen. Additional small bowel surgical change right mid abdomen. No small bowel obstruction or inflammation is seen. There is colonic interposition of the hepatic flexure between the posterior liver and posterior diaphragmatic dome. The appendix is normal caliber. Moderate fecal stasis again noted throughout the large bowel without evidence colitis or diverticulitis. Vascular/Lymphatic: No significant  vascular findings are present. No enlarged abdominal or pelvic lymph nodes. Reproductive: Retroverted uterus with IUD.  No adnexal masses seen. Other: Minimal focal cul-de-sac fluid, probably physiologic given age. There is no free hemorrhage, free air or acute inflammatory changes. There are no incarcerated hernias. Musculoskeletal: There are chronic L5 pars defects and grade 1 L5-S1 spondylolisthesis with partial disc space loss. 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 No acute or other significant osseous findings. IMPRESSION: 1. 8 x 5 x 4 mm mid left ureteral stone with moderate hydroureteronephrosis above this level. 2. There are at least 2, possibly 3 tandem 2-3 mm stones in the distal left ureter just proximal to the UVJ, and a 4 mm stone just distal to these stones. 3. Urothelial thickening in the left renal pelvis and  proximal ureter which may have an infectious basis and clinical correlation is advised. 4. 1.5 x 0.6 cm stone in the inferior pole collecting system of the left kidney, previously 2.2 x 1.2 cm. 5. Constipation and diverticulosis. 6. Chronic L5 pars defects with grade 1 L5-S1 spondylolisthesis and moderate to severe L5-S1 vertical foraminal stenosis. 7. Mildly prominent liver with mild steatosis. 8. Low density in the intracardiac blood pool which may be seen with anemia. Electronically Signed   By: Almira Bar M.D.   On: 05/14/2022 02:19    PROCEDURES and INTERVENTIONS:  Procedures  Medications  oxyCODONE (Oxy IR/ROXICODONE) immediate release tablet 5 mg (has no administration in time range)  lactated ringers bolus 1,000 mL (0 mLs Intravenous Stopped 05/14/22 0523)  ketorolac (TORADOL) 30 MG/ML injection 15 mg (15 mg Intravenous Given 05/14/22 0357)  acetaminophen (TYLENOL) tablet 1,000 mg (1,000 mg Oral Given 05/14/22 0357)  HYDROmorphone (DILAUDID) injection 1 mg (1 mg Intravenous Given 05/14/22 0358)  lidocaine (XYLOCAINE) 158 mg in sodium chloride 0.9 %  100 mL IVPB (0 mg Intravenous Stopped 05/14/22 0619)     IMPRESSION / MDM / ASSESSMENT AND PLAN / ED COURSE  I reviewed the triage vital signs and the nursing notes.  Differential diagnosis includes, but is not limited to, pyelonephritis, infected ureteral stone, ureteral colic, diverticulitis  {Patient presents with symptoms of an acute illness or injury that is potentially life-threatening.  37 year old female with known history of renal stones presents with stigmata of renal colic.  Initially quite uncomfortable, but improving symptoms with IV lidocaine in particular.  Urine without clear infectious features.  She has noted to complain of the fever at home, but has no fever here.  No leukocytosis or stigmata of sepsis appreciated.  Testing negative for viral panel, which could have caused the fevers.  Renal function intact on metabolic panel.  CT confirms a moderately sized stone in the left.  Her pain seems to be getting controlled, we will transition to p.o. meds and if pain is sufficiently controlled will discharge with return precautions, symptomatic measures and referral back to urology as an outpatient  Clinical Course as of 05/14/22 0650  Wed May 14, 2022  0533 Reassessed.  Pain returning.  We discussed IV lidocaine. [DS]  0645 Reassessed.  Reports "finally" feeling a little bit better.  Pain now 3/10 intensity.  We discussed transitioning to oral medications and as long as she continues to have controlled pain the possibility of outpatient management with close urology follow-up.  She is in agreement. [DS]    Clinical Course User Index [DS] Delton Prairie, MD     FINAL CLINICAL IMPRESSION(S) / ED DIAGNOSES   Final diagnoses:  Ureteral colic  Left ureteral stone     Rx / DC Orders   ED Discharge Orders          Ordered    oxyCODONE (ROXICODONE) 5 MG immediate release tablet  Every 8 hours PRN        05/14/22 0648    ondansetron (ZOFRAN-ODT) 4 MG disintegrating tablet   Every 8 hours PRN        05/14/22 0648             Note:  This document was prepared using Dragon voice recognition software and may include unintentional dictation errors.   Delton Prairie, MD 05/14/22 [phone]

## 2022-05-14 NOTE — Telephone Encounter (Signed)
Patient advised and scheduled to come in at 2 pm today

## 2022-05-14 NOTE — ED Notes (Signed)
Report to [place], emt-p and zach, rn

## 2022-05-15 ENCOUNTER — Ambulatory Visit
Admission: RE | Admit: 2022-05-15 | Discharge: 2022-05-15 | Disposition: A | Discharge status: Home / Self Care | Payer: BC Managed Care – PPO | Attending: Urology | Admitting: Urology

## 2022-05-15 ENCOUNTER — Ambulatory Visit: Payer: BC Managed Care – PPO

## 2022-05-15 ENCOUNTER — Encounter
Admission: RE | Disposition: A | Discharge status: Home / Self Care | Payer: Self-pay | Source: Home / Self Care | Attending: Urology

## 2022-05-15 ENCOUNTER — Other Ambulatory Visit: Payer: Self-pay

## 2022-05-15 ENCOUNTER — Encounter: Payer: Self-pay | Admitting: Urology

## 2022-05-15 DIAGNOSIS — Z87442 Personal history of urinary calculi: Secondary | ICD-10-CM | POA: Diagnosis not present

## 2022-05-15 DIAGNOSIS — Z9884 Bariatric surgery status: Secondary | ICD-10-CM | POA: Diagnosis not present

## 2022-05-15 DIAGNOSIS — N132 Hydronephrosis with renal and ureteral calculous obstruction: Secondary | ICD-10-CM | POA: Diagnosis not present

## 2022-05-15 DIAGNOSIS — Z9049 Acquired absence of other specified parts of digestive tract: Secondary | ICD-10-CM | POA: Diagnosis not present

## 2022-05-15 DIAGNOSIS — E7800 Pure hypercholesterolemia, unspecified: Secondary | ICD-10-CM | POA: Diagnosis not present

## 2022-05-15 DIAGNOSIS — N201 Calculus of ureter: Secondary | ICD-10-CM | POA: Diagnosis present

## 2022-05-15 DIAGNOSIS — I10 Essential (primary) hypertension: Secondary | ICD-10-CM | POA: Diagnosis not present

## 2022-05-15 DIAGNOSIS — E119 Type 2 diabetes mellitus without complications: Secondary | ICD-10-CM | POA: Insufficient documentation

## 2022-05-15 HISTORY — PX: EXTRACORPOREAL SHOCK WAVE LITHOTRIPSY: SHX1557

## 2022-05-15 LAB — GLUCOSE, CAPILLARY
Glucose-Capillary: 62 mg/dL — ABNORMAL LOW (ref 70–99)
Glucose-Capillary: 80 mg/dL (ref 70–99)

## 2022-05-15 LAB — POCT PREGNANCY, URINE: Preg Test, Ur: NEGATIVE

## 2022-05-15 SURGERY — LITHOTRIPSY, ESWL
Anesthesia: Moderate Sedation | Laterality: Left

## 2022-05-15 MED ORDER — DIPHENHYDRAMINE HCL 25 MG PO CAPS: 25.0000 mg | ORAL_CAPSULE | ORAL | Status: AC

## 2022-05-15 MED ORDER — SODIUM CHLORIDE 0.9 % IV SOLN: INTRAVENOUS | Status: DC

## 2022-05-15 MED ORDER — ONDANSETRON HCL 4 MG/2ML IJ SOLN: 4.0000 mg | Freq: Once | INTRAMUSCULAR | Status: AC

## 2022-05-15 MED ORDER — ONDANSETRON HCL 4 MG/2ML IJ SOLN
INTRAMUSCULAR | Status: AC
  Administered 2022-05-15: 4 mg via INTRAVENOUS
  Filled 2022-05-15: qty 2

## 2022-05-15 MED ORDER — DIAZEPAM 5 MG PO TABS
ORAL_TABLET | ORAL | Status: AC
  Administered 2022-05-15: 10 mg via ORAL
  Filled 2022-05-15: qty 2

## 2022-05-15 MED ORDER — DIAZEPAM 5 MG PO TABS: 10.0000 mg | ORAL_TABLET | ORAL | Status: AC

## 2022-05-15 MED ORDER — CEPHALEXIN 500 MG PO CAPS
ORAL_CAPSULE | ORAL | Status: AC
  Administered 2022-05-15: 500 mg via ORAL
  Filled 2022-05-15: qty 1

## 2022-05-15 MED ORDER — CEPHALEXIN 500 MG PO CAPS: 500.0000 mg | ORAL_CAPSULE | Freq: Once | ORAL | Status: AC

## 2022-05-15 MED ORDER — DIPHENHYDRAMINE HCL 25 MG PO CAPS
ORAL_CAPSULE | ORAL | Status: AC
  Administered 2022-05-15: 25 mg via ORAL
  Filled 2022-05-15: qty 1

## 2022-05-15 NOTE — Discharge Instructions (Signed)

## 2022-05-15 NOTE — Interval H&P Note (Signed)
History and Physical Interval Note:  05/15/2022 1:35 PM  Maria Fuller  has presented today for surgery, with the diagnosis of Left Ureteral Stone.  The various methods of treatment have been discussed with the patient and family. After consideration of risks, benefits and other options for treatment, the patient has consented to  Procedure(s): EXTRACORPOREAL SHOCK WAVE LITHOTRIPSY (ESWL) (Left) as a surgical intervention.  The patient's history has been reviewed, patient examined, no change in status, stable for surgery.  I have reviewed the patient's chart and labs.  Questions were answered to the patient's satisfaction.     [name]

## 2022-05-16 ENCOUNTER — Encounter: Payer: Self-pay | Admitting: Urology

## 2022-05-16 MED ORDER — OXYCODONE-ACETAMINOPHEN 5-325 MG PO TABS: 1.0000 | ORAL_TABLET | ORAL | 0 refills | Status: DC | PRN

## 2022-05-19 ENCOUNTER — Encounter: Payer: Self-pay | Admitting: Urology

## 2022-05-27 ENCOUNTER — Emergency Department: Payer: BC Managed Care – PPO

## 2022-05-27 ENCOUNTER — Other Ambulatory Visit: Payer: Self-pay

## 2022-05-27 ENCOUNTER — Emergency Department
Admission: EM | Admit: 2022-05-27 | Discharge: 2022-05-27 | Disposition: A | Discharge status: Home / Self Care | Payer: BC Managed Care – PPO | Attending: Emergency Medicine | Admitting: Emergency Medicine

## 2022-05-27 ENCOUNTER — Encounter: Payer: Self-pay | Admitting: *Deleted

## 2022-05-27 ENCOUNTER — Encounter: Payer: Self-pay | Admitting: Urology

## 2022-05-27 DIAGNOSIS — N201 Calculus of ureter: Secondary | ICD-10-CM

## 2022-05-27 DIAGNOSIS — Z20822 Contact with and (suspected) exposure to covid-19: Secondary | ICD-10-CM | POA: Insufficient documentation

## 2022-05-27 DIAGNOSIS — N132 Hydronephrosis with renal and ureteral calculous obstruction: Secondary | ICD-10-CM | POA: Insufficient documentation

## 2022-05-27 DIAGNOSIS — R109 Unspecified abdominal pain: Secondary | ICD-10-CM

## 2022-05-27 DIAGNOSIS — R1032 Left lower quadrant pain: Secondary | ICD-10-CM | POA: Diagnosis present

## 2022-05-27 LAB — COMPREHENSIVE METABOLIC PANEL
ALT: 15 U/L (ref 0–44)
AST: 16 U/L (ref 15–41)
Albumin: 3.9 g/dL (ref 3.5–5.0)
Alkaline Phosphatase: 84 U/L (ref 38–126)
Anion gap: 6 (ref 5–15)
BUN: 10 mg/dL (ref 6–20)
CO2: 23 mmol/L (ref 22–32)
Calcium: 8.2 mg/dL — ABNORMAL LOW (ref 8.9–10.3)
Chloride: 109 mmol/L (ref 98–111)
Creatinine, Ser: 0.64 mg/dL (ref 0.44–1.00)
GFR, Estimated: >60 mL/min (ref >60)
Glucose, Bld: 82 mg/dL (ref 70–99)
Potassium: 4.1 mmol/L (ref 3.5–5.1)
Sodium: 138 mmol/L (ref 135–145)
Total Bilirubin: 0.7 mg/dL (ref 0.3–1.2)
Total Protein: 6.9 g/dL (ref 6.5–8.1)

## 2022-05-27 LAB — CBC
HCT: 29.1 % — ABNORMAL LOW (ref 36.0–46.0)
Hemoglobin: 9.1 g/dL — ABNORMAL LOW (ref 12.0–15.0)
MCH: 32.7 pg (ref 26.0–34.0)
MCHC: 31.3 g/dL (ref 30.0–36.0)
MCV: 104.7 fL — ABNORMAL HIGH (ref 80.0–100.0)
Platelets: 421 10*3/uL — ABNORMAL HIGH (ref 150–400)
RBC: 2.78 MIL/uL — ABNORMAL LOW (ref 3.87–5.11)
RDW: 14.5 % (ref 11.5–15.5)
WBC: 6.8 10*3/uL (ref 4.0–10.5)
nRBC: 0 % (ref 0.0–0.2)

## 2022-05-27 LAB — RESP PANEL BY RT-PCR (RSV, FLU A&B, COVID)  RVPGX2
Influenza A by PCR: NEGATIVE
Influenza B by PCR: NEGATIVE
Resp Syncytial Virus by PCR: NEGATIVE
SARS Coronavirus 2 by RT PCR: NEGATIVE

## 2022-05-27 LAB — URINALYSIS, ROUTINE W REFLEX MICROSCOPIC
Bilirubin Urine: NEGATIVE
Glucose, UA: NEGATIVE mg/dL
Hgb urine dipstick: NEGATIVE
Ketones, ur: NEGATIVE mg/dL
Leukocytes,Ua: NEGATIVE
Nitrite: NEGATIVE
Protein, ur: NEGATIVE mg/dL
Specific Gravity, Urine: 1.02 (ref 1.005–1.030)
pH: 5 (ref 5.0–8.0)

## 2022-05-27 LAB — LIPASE, BLOOD: Lipase: 30 U/L (ref 11–51)

## 2022-05-27 LAB — POC URINE PREG, ED: Preg Test, Ur: NEGATIVE

## 2022-05-27 MED ORDER — HYDROCODONE-ACETAMINOPHEN 5-325 MG PO TABS: 1.0000 | ORAL_TABLET | ORAL | 0 refills | Status: DC | PRN

## 2022-05-27 MED ORDER — SODIUM CHLORIDE 0.9 % IV BOLUS
1000.0000 mL | Freq: Once | INTRAVENOUS | Status: AC
  Administered 2022-05-27: 1000 mL via INTRAVENOUS

## 2022-05-27 MED ORDER — KETOROLAC TROMETHAMINE 10 MG PO TABS: 10.0000 mg | ORAL_TABLET | Freq: Four times a day (QID) | ORAL | 0 refills | Status: DC | PRN

## 2022-05-27 MED ORDER — IOHEXOL 300 MG/ML  SOLN
100.0000 mL | Freq: Once | INTRAMUSCULAR | Status: AC | PRN
  Administered 2022-05-27: 100 mL via INTRAVENOUS

## 2022-05-27 MED ORDER — HYDROCODONE-ACETAMINOPHEN 5-325 MG PO TABS
1.0000 | ORAL_TABLET | Freq: Once | ORAL | Status: AC
  Administered 2022-05-27: 1 via ORAL
  Filled 2022-05-27: qty 1

## 2022-05-27 NOTE — ED Provider Notes (Signed)
[facility] Provider Note   Event Date/Time   First MD Initiated Contact with Patient 05/27/22 1618     (approximate) History  Abdominal Pain  HPI Maria Fuller is a 37 y.o. female with a past medical history of duodenal switch surgery who presents complaining of left flank pain that began approximately 3 days prior to arrival and has been steady since onset at 5/10 left-sided flank pain that radiates around into her left groin.  Patient endorses nausea without vomiting.  Patient states that the pain is significantly improved over the past few hours ROS: Patient currently denies any vision changes, tinnitus, difficulty speaking, facial droop, sore throat, chest pain, shortness of breath, nausea/vomiting/diarrhea, dysuria, or weakness/numbness/paresthesias in any extremity   Physical Exam  Triage Vital Signs: ED Triage Vitals  Enc Vitals Group     BP 05/27/22 1520 (!) 102/90     Pulse Rate 05/27/22 1520 (!) 55     Resp 05/27/22 1520 20     Temp 05/27/22 1520 98.5 F (36.9 C)     Temp Source 05/27/22 1520 Oral     SpO2 05/27/22 1520 99 %     Weight 05/27/22 1519 231 lb (104.8 kg)     Height 05/27/22 1519 6\' 1"  (1.854 m)     Head Circumference --      Peak Flow --      Pain Score 05/27/22 1519 5     Pain Loc --      Pain Edu? --      Excl. in [place]? --    Most recent vital signs: Vitals:   05/27/22 1826 05/27/22 1921  BP: 110/88 114/65  Pulse: 62 (!) 52  Resp: 20 18  Temp:  98 F (36.7 C)  SpO2: 99% 100%   General: Awake, oriented x4. CV:  Good peripheral perfusion.  Resp:  Normal effort.  Abd:  No distention.  Other:  Middle-aged overweight Caucasian female laying in bed in no acute distress ED Results / Procedures / Treatments  Labs (all labs ordered are listed, but only abnormal results are displayed) Labs Reviewed  COMPREHENSIVE METABOLIC PANEL - Abnormal; Notable for the following components:      Result Value   Calcium 8.2 (*)    All  other components within normal limits  CBC - Abnormal; Notable for the following components:   RBC 2.78 (*)    Hemoglobin 9.1 (*)    HCT 29.1 (*)    MCV 104.7 (*)    Platelets 421 (*)    All other components within normal limits  URINALYSIS, ROUTINE W REFLEX MICROSCOPIC - Abnormal; Notable for the following components:   Color, Urine YELLOW (*)    APPearance HAZY (*)    All other components within normal limits  RESP PANEL BY RT-PCR (RSV, FLU A&B, COVID)  RVPGX2  LIPASE, BLOOD  POC URINE PREG, ED   RADIOLOGY ED MD interpretation: CT of the abdomen and pelvis with IV contrast interpreted independently by me and shows very mild residual left hydroureteronephrosis with perinephric and periureteral stranding and interval passage of the ureteral stones that were seen on prior imaging -Agree with radiology assessment Official radiology report(s): CT Abdomen Pelvis W Contrast  Result Date: 05/27/2022 CLINICAL DATA:  Left lower abdominal pain and left lower back pain. EXAM: CT ABDOMEN AND PELVIS WITH CONTRAST TECHNIQUE: Multidetector CT imaging of the abdomen and pelvis was performed using the standard protocol following bolus administration of intravenous contrast. RADIATION DOSE REDUCTION:  This exam was performed according to the departmental dose-optimization program which includes automated exposure control, adjustment of the mA and/or kV according to patient size and/or use of iterative reconstruction technique. CONTRAST:  110mL OMNIPAQUE IOHEXOL 300 MG/ML  SOLN COMPARISON:  Multiple priors including most recent CT May 14 2022 FINDINGS: Lower chest: No acute abnormality. Hepatobiliary: Mild hepatomegaly and hepatic steatosis. Gallbladder is surgically absent. No biliary ductal dilation. Pancreas: No pancreatic ductal dilation or evidence of acute inflammation. Spleen: No splenomegaly. Adrenals/Urinary Tract: Bilateral adrenal glands appear normal. Very mild left hydroureteronephrosis with  perinephric and periureteric stranding and interval passage of the ureteral stones seen on prior imaging. Decreased left renal stone burden with the lower pole renal calculus measuring up to 7 mm previously 11 mm. No right-sided hydronephrosis or nephrolithiasis Urinary bladder is unremarkable for degree of distension. Stomach/Bowel: Surgical changes of prior gastric sleeve. Right lower quadrant enterotomy sutures. No evidence of acute bowel inflammation. Vascular/Lymphatic: Normal caliber abdominal aorta. Smooth IVC contours. No pathologically enlarged abdominal or pelvic lymph nodes. Reproductive: Intrauterine device appears appropriate in positioning. Other: No significant abdominopelvic free fluid. 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 No acute osseous abnormality. IMPRESSION: 1. Very mild residual left hydroureteronephrosis with perinephric and periureteric stranding and interval passage of the ureteral stones seen on prior imaging. Suggest correlation with laboratory values for superimposed infection. 2. Decreased left renal stone burden with the lower pole renal calculus measuring up to 7 mm previously 11 mm. 3. Mild hepatomegaly and hepatic steatosis. Electronically Signed   By: Dahlia Bailiff M.D.   On: 05/27/2022 17:54   PROCEDURES: Critical Care performed: No .1-3 Lead EKG Interpretation  Performed by: Naaman Plummer, MD Authorized by: Naaman Plummer, MD     Interpretation: normal     ECG rate:  71   ECG rate assessment: normal     Rhythm: sinus rhythm     Ectopy: none     Conduction: normal    MEDICATIONS ORDERED IN ED: Medications  sodium chloride 0.9 % bolus 1,000 mL (0 mLs Intravenous Stopped 05/27/22 1922)  iohexol (OMNIPAQUE) 300 MG/ML solution 100 mL (100 mLs Intravenous Contrast Given 05/27/22 1728)  HYDROcodone-acetaminophen (NORCO/VICODIN) 5-325 MG per tablet 1 tablet (1 tablet Oral Given 05/27/22 1925)   IMPRESSION / MDM /  ASSESSMENT AND PLAN / ED COURSE  I reviewed the triage vital signs and the nursing notes.                             The patient is on the cardiac monitor to evaluate for evidence of arrhythmia and/or significant heart rate changes. Patient's presentation is most consistent with acute presentation with potential threat to life or bodily function. Patient presents for severe flank pain. Presentation most consistent with Renal Colic from a Non-infected Kidney Stone. Given History and Exam I have lower suspicion for atypical appendicitis, genital torsion, acute cholecystitis, AAA, Aortic Dissection, Serious Bacterial Illness or other emergent intraabdominal pathology.  Workup: CBC, BMP, CT Abd/Pelvis noncontrast, UA, reassess Findings: Likely recently passed left-sided ureteral lithiasis Reassesment: Patient tolerating PO and pain controlled Disposition:  Discharge. Strict return precautions for infected stone or PO intolerance discussed.   FINAL CLINICAL IMPRESSION(S) / ED DIAGNOSES   Final diagnoses:  Acute left flank pain  Ureterolithiasis   Rx / DC Orders   ED Discharge Orders          Ordered  HYDROcodone-acetaminophen (NORCO) 5-325 MG tablet  Every 4 hours PRN        05/27/22 1908    ketorolac (TORADOL) 10 MG tablet  Every 6 hours PRN       Note to Pharmacy: Patient given an IM/IV loading dose in emergency department   05/27/22 1908           Note:  This document was prepared using Dragon voice recognition software and may include unintentional dictation errors.   Naaman Plummer, MD 05/27/22 2108

## 2022-05-27 NOTE — ED Notes (Signed)
See triage note  Presents with lower abd pain  pain is mainly on the left   moves into her back  Positive n/v

## 2022-05-27 NOTE — ED Triage Notes (Signed)
Pt reports left lower abd pain and left lower back pain.  Recent visits to ER for back pain.  Pt states pain in lower abd with vomiting. No dysuria.  No vag bleeding.  No diarrhea.  Pt alert   speech clear.

## 2022-05-28 NOTE — Telephone Encounter (Signed)
No fever or vomiting, urine looks fine. No blood in the urine. Just the pain is present but better with the medications. She was given Toradol and Vicodin.

## 2022-05-29 ENCOUNTER — Ambulatory Visit
Admission: RE | Admit: 2022-05-29 | Discharge: 2022-05-29 | Disposition: A | Discharge status: Home / Self Care | Payer: BC Managed Care – PPO | Source: Ambulatory Visit | Attending: Urology | Admitting: Urology

## 2022-05-29 ENCOUNTER — Encounter: Payer: Self-pay | Admitting: Urology

## 2022-05-29 ENCOUNTER — Ambulatory Visit (INDEPENDENT_AMBULATORY_CARE_PROVIDER_SITE_OTHER): Payer: BC Managed Care – PPO | Admitting: Urology

## 2022-05-29 VITALS — BP 125/79 | HR 57 | Ht 73.0 in | Wt 231.0 lb

## 2022-05-29 DIAGNOSIS — R109 Unspecified abdominal pain: Secondary | ICD-10-CM

## 2022-05-29 DIAGNOSIS — N200 Calculus of kidney: Secondary | ICD-10-CM | POA: Insufficient documentation

## 2022-05-29 LAB — URINALYSIS, COMPLETE
Bilirubin, UA: NEGATIVE
Glucose, UA: NEGATIVE
Ketones, UA: NEGATIVE
Leukocytes,UA: NEGATIVE
Nitrite, UA: NEGATIVE
Protein,UA: NEGATIVE
RBC, UA: NEGATIVE
Specific Gravity, UA: >1.03 — ABNORMAL HIGH (ref 1.005–1.030)
Urobilinogen, Ur: 0.2 mg/dL (ref 0.2–1.0)
pH, UA: 5 (ref 5.0–7.5)

## 2022-05-29 LAB — MICROSCOPIC EXAMINATION

## 2022-05-29 LAB — BLADDER SCAN AMB NON-IMAGING

## 2022-05-29 MED ORDER — SULFAMETHOXAZOLE-TRIMETHOPRIM 800-160 MG PO TABS: 1.0000 | ORAL_TABLET | Freq: Two times a day (BID) | ORAL | 0 refills | Status: DC

## 2022-05-29 NOTE — Progress Notes (Signed)
05/29/2022 6:46 PM   Maria Fuller [birth year]  [id number]  Referring provider: Gardiner Coins, PA-C [address] [place],  [place] [address]  Urological history: 1.  Nephrolithiasis -Stone composition 90% calcium oxalate monohydrate and 10% calcium oxalate dihydrate -right URS (09/2016) -left URS (04/2022)  -left URS (05/2022) -left ESWL (05/2022)  Chief Complaint  Patient presents with   Acute Visit   Flank Pain    HPI: Maria Fuller is a 37 y.o. female who presents today for flank pain.   She presented to the emergency department on May 27, 2022 with a complaint of left-sided flank pain that began 3 days prior to her arrival.  It has been constant since its onset with a 5 out of 10 degree of pain and radiating to her left groin.  She was having some nausea, but denied any vomiting.  Contrast CT noted very mild residual left hydroureteronephrosis with perinephric and periureteral stranding and no ureteral stones with a decreased left renal stone burden with a lower pole renal calculus measuring up to 7 mm in size.    Labs in the ED: Serum creatinine 0.64, WBC 6.8, urinalysis yellow hazy, specific gravity 1.020 and pH 5.0.   Meds given in the ED: She was given Vicodin 5/325 and Toradol 10 mg   Today, she continues to have the left flank pain radiating to the left groin with some nausea, vomiting and chills.  She states the pain is controlled with the medications given to her in the ED, but she does not want to just take pain medicine without knowing why she is hurting.  Patient denies any modifying or aggravating factors.  Patient denies any dysuria or suprapubic.  Patient denies any fevers.    UA yellow clear, specific gravity greater than 1.030, pH 5.0, 0-5 RBCs, 0-2 RBCs, 0-10 epithelial cells, mucus threads present and many bacteria.  PVR 0 mL   KUB left renal stone.   PMH: Past Medical History:  Diagnosis Date   Anemia    slight   Bradycardia     Depression    Diabetes mellitus without complication (HCC)    resolved with weight loss from gastric bypass   Headache    migraines. resolved since gastric bypass   High cholesterol    resolved since weight loss surgery   History of kidney stones    last week   Hypertension    resolved since gastric bypass, severe pre-eclampsia with G1   Low BP    Meningitis 2015   PCOS (polycystic ovarian syndrome)    Preterm labor    Sleep apnea    had gastric bypass surgery and now resolved   Syncope    Vertigo couple years ago    Surgical History: Past Surgical History:  Procedure Laterality Date   CESAREAN SECTION     CESAREAN SECTION N/A 05/07/2016   Procedure: CESAREAN SECTION;  Surgeon: Nadara Mustard, MD;  Location: [facility];  Service: Obstetrics;  Laterality: N/A;  Time of birth: [birth year] Sex: Female Weight: 3940 kg, 8 lb 11 oz   CHOLECYSTECTOMY     CYSTOSCOPY WITH STENT PLACEMENT Right 09/19/2016   Procedure: CYSTOSCOPY WITH STENT PLACEMENT;  Surgeon: Hildred Laser, MD;  Location: [facility];  Service: Urology;  Laterality: Right;   CYSTOSCOPY WITH STENT PLACEMENT Left 04/18/2022   Procedure: CYSTOSCOPY WITH STENT PLACEMENT;  Surgeon: Bjorn Pippin, MD;  Location: [facility];  Service: Urology;  Laterality: Left;   CYSTOSCOPY/URETEROSCOPY/HOLMIUM LASER/STENT  PLACEMENT Left 05/08/2022   Procedure: CYSTOSCOPY/URETEROSCOPY/HOLMIUM LASER/STENT EXCHANGE;  Surgeon: Vanna Scotland, MD;  Location: [facility];  Service: Urology;  Laterality: Left;   EXTRACORPOREAL SHOCK WAVE LITHOTRIPSY Left 05/15/2022   Procedure: EXTRACORPOREAL SHOCK WAVE LITHOTRIPSY (ESWL);  Surgeon: Vanna Scotland, MD;  Location: [facility];  Service: Urology;  Laterality: Left;   FLEXIBLE SIGMOIDOSCOPY N/A 07/28/2019   Procedure: FLEXIBLE SIGMOIDOSCOPY;  Surgeon: Toney Reil, MD;  Location: [facility];  Service: Endoscopy;  Laterality: N/A;   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   LAPAROSCOPIC OVARIAN  CYSTECTOMY Right 09/28/2018   Procedure: LAPAROSCOPIC RIGHT OVARIAN CYSTECTOMY;  Surgeon: Nadara Mustard, MD;  Location: [facility];  Service: Gynecology;  Laterality: Right;   LAPAROSCOPIC UNILATERAL SALPINGO OOPHERECTOMY Right 07/25/2021   Procedure: LAPAROSCOPIC UNILATERAL SALPINGO OOPHORECTOMY;  Surgeon: Nadara Mustard, MD;  Location: [facility];  Service: Gynecology;  Laterality: Right;   LEFT HEART CATH AND CORONARY ANGIOGRAPHY Left 04/07/2018   Procedure: LEFT HEART CATH AND CORONARY ANGIOGRAPHY;  Surgeon: Alwyn Pea, MD;  Location: [facility];  Service: Cardiovascular;  Laterality: Left;   small bowel obstruction  07/2019   TONSILLECTOMY     URETEROSCOPY WITH HOLMIUM LASER LITHOTRIPSY Right 09/19/2016   Procedure: URETEROSCOPY WITH HOLMIUM LASER LITHOTRIPSY;  Surgeon: Hildred Laser, MD;  Location: [facility];  Service: Urology;  Laterality: Right;   XI ROBOTIC ASSISTED VENTRAL HERNIA N/A 07/27/2021   Procedure: XI ROBOTIC ASSISTED INCARCERATED INCISIONAL HERNIA REPAIR;  Surgeon: Campbell Lerner, MD;  Location: [facility];  Service: General;  Laterality: N/A;    Home Medications:  Allergies as of 05/29/2022       Reactions   Compazine [prochlorperazine] Other (See Comments)   Panic attack   Ceftin [cefuroxime Axetil] Rash   Tolerated cefazolin before   Penicillins Rash   TOLERATED CEFAZOLIN BEFORE Has patient had a PCN reaction causing immediate rash, facial/tongue/throat swelling, SOB or lightheadedness with hypotension: Yes Has patient had a PCN reaction causing severe rash involving mucus membranes or skin necrosis: No Has patient had a PCN reaction that required hospitalization No Has patient had a PCN reaction occurring within the last 10 years: No If all of the above answers are "NO", then may proceed with Cephalosporin use.        Medication List        Accurate as of May 29, 2022 11:59 PM. If you have any questions, ask your nurse or doctor.           STOP taking these medications    oxybutynin 5 MG tablet Commonly known as: DITROPAN Stopped by: Jaculin Rasmus, PA-C   oxyCODONE 5 MG immediate release tablet Commonly known as: Roxicodone Stopped by: Elsie Sakuma, PA-C   oxyCODONE-acetaminophen 5-325 MG tablet Commonly known as: Percocet Stopped by: Michiel Cowboy, PA-C       TAKE these medications    buPROPion 150 MG 24 hr tablet Commonly known as: WELLBUTRIN XL Take 150 mg by mouth every morning.   dicyclomine 20 MG tablet Commonly known as: BENTYL Take 20 mg by mouth as needed.   HYDROcodone-acetaminophen 5-325 MG tablet Commonly known as: Norco Take 1 tablet by mouth every 4 (four) hours as needed for moderate pain or severe pain.   hyoscyamine 0.125 MG/ML solution Commonly known as: LEVSIN Take 0.125 mg by mouth 3 (three) times daily.   ketorolac 10 MG tablet Commonly known as: TORADOL Take 1 tablet (10 mg total) by mouth every 6 (six) hours  as needed.   levonorgestrel 20 MCG/24HR IUD Commonly known as: MIRENA 1 each by Intrauterine route once.   ondansetron 4 MG disintegrating tablet Commonly known as: ZOFRAN-ODT Take 1 tablet (4 mg total) by mouth every 8 (eight) hours as needed.   pantoprazole 40 MG tablet Commonly known as: PROTONIX Take 40 mg by mouth every morning.   sertraline 100 MG tablet Commonly known as: ZOLOFT Take 100 mg by mouth 2 (two) times daily.   sulfamethoxazole-trimethoprim 800-160 MG tablet Commonly known as: BACTRIM DS Take 1 tablet by mouth every 12 (twelve) hours. Started by: Zara Council, PA-C   tamsulosin 0.4 MG Caps capsule Commonly known as: FLOMAX Take 1 capsule (0.4 mg total) by mouth daily.   traZODone 50 MG tablet Commonly known as: DESYREL Take 50 mg by mouth at bedtime.        Allergies:  Allergies  Allergen Reactions   Compazine [Prochlorperazine] Other (See Comments)    Panic attack   Ceftin [Cefuroxime Axetil] Rash     Tolerated cefazolin before   Penicillins Rash    TOLERATED CEFAZOLIN BEFORE Has patient had a PCN reaction causing immediate rash, facial/tongue/throat swelling, SOB or lightheadedness with hypotension: Yes Has patient had a PCN reaction causing severe rash involving mucus membranes or skin necrosis: No Has patient had a PCN reaction that required hospitalization No Has patient had a PCN reaction occurring within the last 10 years: No If all of the above answers are "NO", then may proceed with Cephalosporin use.    Family History: Family History  Problem Relation Age of Onset   Polycystic ovary syndrome Mother    Diabetes Father    Bladder Cancer Father    Depression Father    Endometriosis Sister    Polycystic ovary syndrome Sister    Breast cancer Paternal Grandmother    Diabetes Paternal Grandmother    Diabetes Paternal Grandfather    Kidney cancer Neg Hx    Prostate cancer Neg Hx     Social History:  reports that she has never smoked. She has never been exposed to tobacco smoke. She has never used smokeless tobacco. She reports that she does not drink alcohol and does not use drugs.  ROS: Pertinent ROS in HPI  Physical Exam: BP 125/79   Pulse (!) 57   Ht 6\' 1"  (1.854 m)   Wt 231 lb (104.8 kg)   LMP  (LMP Unknown)   BMI 30.48 kg/m   Constitutional:  Well nourished. Alert and oriented, No acute distress. HEENT: [place] AT, moist mucus membranes.  Trachea midline Cardiovascular: No clubbing, cyanosis, or edema. Respiratory: Normal respiratory effort, no increased work of breathing. Neurologic: Grossly intact, no focal deficits, moving all 4 extremities. Psychiatric: Normal mood and affect.  Laboratory Data: Lab Results  Component Value Date   WBC 6.8 05/27/2022   HGB 9.1 (L) 05/27/2022   HCT 29.1 (L) 05/27/2022   MCV 104.7 (H) 05/27/2022   PLT 421 (H) 05/27/2022    Lab Results  Component Value Date   CREATININE 0.64 05/27/2022    Lab Results  Component Value  Date   AST 16 05/27/2022   Lab Results  Component Value Date   ALT 15 05/27/2022    Urinalysis See EPIC and HPI I have reviewed the labs.   Pertinent Imaging:  05/29/22 15:23  Scan Result 19ml   Narrative & Impression  CLINICAL DATA:  Lithotripsy January 4th and May 15, 2022   EXAM: ABDOMEN - 1 VIEW  COMPARISON:  KUB May 15, 2022.  CT scan May 27, 2022.   FINDINGS: A calcification in the lower pole of the left kidney correlates with the known stone identified on the May 27, 2022 CT scan. No other renal stones are noted. No ureteral stones identified. An IUD is seen in the pelvis. No other acute abnormalities.   IMPRESSION: A calcification in the lower pole of the left kidney correlates with the known stone identified in the lower pole of the left kidney on the May 27, 2022 CT scan. No ureteral stones noted on this study. CT imaging would be more sensitive.     Electronically Signed   By: Dorise Bullion III M.D.   On: 06/01/2022 18:01  I have independently reviewed the films.  See HPI.    Assessment & Plan:    1. Nephrolithiasis - Urinalysis, Complete - CULTURE, URINE COMPREHENSIVE - BLADDER SCAN AMB NON-IMAGING -left renal stone noted on KUB  2. Left flank pain -This may be the result of ureteral fragments that are not identified on recent CT or KUB, post-inflammatory symptoms after ESWL or UTI -Urine culture is pending -Have her start Bactrim DS twice daily for 7 days while waiting urine culture results -Reviewed return precautions  Return for pending urine culture results .  These notes generated with voice recognition software. I apologize for typographical errors.  [place], [place] [address]  [place], [place] [address] [phone]

## 2022-05-31 LAB — CULTURE, URINE COMPREHENSIVE

## 2022-06-02 ENCOUNTER — Telehealth: Payer: Self-pay | Admitting: *Deleted

## 2022-06-02 NOTE — Telephone Encounter (Signed)
Left message on voice mail per DPR .  °

## 2022-06-02 NOTE — Telephone Encounter (Signed)
-----  Message from Nori Riis, PA-C sent at 06/01/2022  7:16 PM EST ----- Please let Maria Fuller know that her urine culture was negative for infection, so she can stop the Septra/Bactrim at this time.

## 2022-06-04 ENCOUNTER — Ambulatory Visit
Admission: RE | Admit: 2022-06-04 | Discharge: 2022-06-04 | Disposition: A | Discharge status: Home / Self Care | Payer: BC Managed Care – PPO | Source: Ambulatory Visit | Attending: Urology | Admitting: Urology

## 2022-06-04 DIAGNOSIS — N200 Calculus of kidney: Secondary | ICD-10-CM | POA: Insufficient documentation

## 2022-06-10 ENCOUNTER — Encounter: Payer: Self-pay | Admitting: Urology

## 2022-06-10 ENCOUNTER — Ambulatory Visit: Payer: BC Managed Care – PPO | Admitting: Urology

## 2022-06-10 VITALS — BP 117/69 | HR 67 | Ht 73.0 in | Wt 228.0 lb

## 2022-06-10 DIAGNOSIS — Z87442 Personal history of urinary calculi: Secondary | ICD-10-CM

## 2022-06-10 DIAGNOSIS — N200 Calculus of kidney: Secondary | ICD-10-CM

## 2022-06-10 NOTE — Progress Notes (Signed)
I, Maria Fuller,acting as a scribe for Maria Espy, MD.,have documented all relevant documentation on the behalf of Maria Espy, MD,as directed by  Maria Espy, MD while in the presence of Maria Espy, MD.   I, Maria Fuller,acting as a scribe for Maria Espy, MD.,have documented all relevant documentation on the behalf of Maria Espy, MD,as directed by  Maria Espy, MD while in the presence of Maria Espy, MD.   06/10/22 5:32 PM   Vass 38-Apr-1987 [id number]  Referring provider: Sherre Scarlet, PA-C [place],  [place] [address]  Chief Complaint  Patient presents with   Follow-up   Nephrolithiasis    46mth follow-up discuss RUS    HPI: 37 year-old female who presents today for follow-up of kidney stones.   She has a complicated course initially presenting with an obstructing stone which required urgent ureteral stent placement due to concern of infection. This was followed by a staged ureteroscopy to treat both her left proximal ureteral stone as well as some non-obstructing lower pole stone burden. Postoperatively, after her stent was removed, she developed recurrent severe flank pain and was noted to have an additional obstructing fragment measuring 8 mm in the mid ureter. She underwent shockwave lithotripsy on 05/15/22. She experienced post procedural pain and returned to the ER on 05/27/22 where she had another CT scan. It shows interval clearance of the ureteral calculus and very mild residual hydronephrosis. She also had significant reduction in stone burden in the left lower pole.   Her renal ultrasound on 06/04/22 shows complete resolution of her hydronephrosis and no obvious residual stone burden. Her stone composition was 90% calcium oxalate monohydrate and 10% calcium dihydrate.  Despite overall improvement, she reports daily "twinges" of pain, raising concerns of recurrence but is otherwise feeling good.  PMH: Past Medical  History:  Diagnosis Date   Anemia    slight   Bradycardia    Depression    Diabetes mellitus without complication ([place])    resolved with weight loss from gastric bypass   Headache    migraines. resolved since gastric bypass   High cholesterol    resolved since weight loss surgery   History of kidney stones    last week   Hypertension    resolved since gastric bypass, severe pre-eclampsia with G1   Low BP    Meningitis 2015   PCOS (polycystic ovarian syndrome)    Preterm labor    Sleep apnea    had gastric bypass surgery and now resolved   Syncope    Vertigo couple years ago    Surgical History: Past Surgical History:  Procedure Laterality Date   CESAREAN SECTION     CESAREAN SECTION N/A 05/07/2016   Procedure: CESAREAN SECTION;  Surgeon: Maria Dry, MD;  Location: [facility];  Service: Obstetrics;  Laterality: N/A;  Time of birth: [birth year] Sex: Female Weight: 3940 kg, 8 lb 11 oz   CHOLECYSTECTOMY     CYSTOSCOPY WITH STENT PLACEMENT Right 09/19/2016   Procedure: CYSTOSCOPY WITH STENT PLACEMENT;  Surgeon: Maria Retort, MD;  Location: [facility];  Service: Urology;  Laterality: Right;   CYSTOSCOPY WITH STENT PLACEMENT Left 04/18/2022   Procedure: CYSTOSCOPY WITH STENT PLACEMENT;  Surgeon: Maria Seal, MD;  Location: [facility];  Service: Urology;  Laterality: Left;   CYSTOSCOPY/URETEROSCOPY/HOLMIUM LASER/STENT PLACEMENT Left 05/08/2022   Procedure: CYSTOSCOPY/URETEROSCOPY/HOLMIUM LASER/STENT EXCHANGE;  Surgeon: Maria Espy, MD;  Location: [facility];  Service: Urology;  Laterality: Left;  EXTRACORPOREAL SHOCK WAVE LITHOTRIPSY Left 05/15/2022   Procedure: EXTRACORPOREAL SHOCK WAVE LITHOTRIPSY (ESWL);  Surgeon: Maria Espy, MD;  Location: [facility];  Service: Urology;  Laterality: Left;   FLEXIBLE SIGMOIDOSCOPY N/A 07/28/2019   Procedure: FLEXIBLE SIGMOIDOSCOPY;  Surgeon: Maria Landsman, MD;  Location: [facility];  Service: Endoscopy;  Laterality: N/A;   GASTRIC  BYPASS  04/2015   Duodenal switch   LAPAROSCOPIC OVARIAN CYSTECTOMY Right 09/28/2018   Procedure: LAPAROSCOPIC RIGHT OVARIAN CYSTECTOMY;  Surgeon: Maria Dry, MD;  Location: [facility];  Service: Gynecology;  Laterality: Right;   LAPAROSCOPIC UNILATERAL SALPINGO OOPHERECTOMY Right 07/25/2021   Procedure: LAPAROSCOPIC UNILATERAL SALPINGO OOPHORECTOMY;  Surgeon: Maria Dry, MD;  Location: [facility];  Service: Gynecology;  Laterality: Right;   LEFT HEART CATH AND CORONARY ANGIOGRAPHY Left 04/07/2018   Procedure: LEFT HEART CATH AND CORONARY ANGIOGRAPHY;  Surgeon: Maria Kida, MD;  Location: [facility];  Service: Cardiovascular;  Laterality: Left;   small bowel obstruction  07/2019   TONSILLECTOMY     URETEROSCOPY WITH HOLMIUM LASER LITHOTRIPSY Right 09/19/2016   Procedure: URETEROSCOPY WITH HOLMIUM LASER LITHOTRIPSY;  Surgeon: Maria Retort, MD;  Location: [facility];  Service: Urology;  Laterality: Right;   XI ROBOTIC ASSISTED VENTRAL HERNIA N/A 07/27/2021   Procedure: XI ROBOTIC ASSISTED INCARCERATED INCISIONAL HERNIA REPAIR;  Surgeon: Maria Bacon, MD;  Location: [facility];  Service: General;  Laterality: N/A;    Home Medications:  Allergies as of 06/10/2022       Reactions   Compazine [prochlorperazine] Other (See Comments)   Panic attack   Ceftin [cefuroxime Axetil] Rash   Tolerated cefazolin before   Penicillins Rash   TOLERATED CEFAZOLIN BEFORE Has patient had a PCN reaction causing immediate rash, facial/tongue/throat swelling, SOB or lightheadedness with hypotension: Yes Has patient had a PCN reaction causing severe rash involving mucus membranes or skin necrosis: No Has patient had a PCN reaction that required hospitalization No Has patient had a PCN reaction occurring within the last 10 years: No If all of the above answers are "NO", then may proceed with Cephalosporin use.        Medication List        Accurate as of June 10, 2022  5:32 PM.  If you have any questions, ask your nurse or doctor.          STOP taking these medications    HYDROcodone-acetaminophen 5-325 MG tablet Commonly known as: Norco Stopped by: Maria Espy, MD   ketorolac 10 MG tablet Commonly known as: TORADOL Stopped by: Maria Espy, MD   ondansetron 4 MG disintegrating tablet Commonly known as: ZOFRAN-ODT Stopped by: Maria Espy, MD   sulfamethoxazole-trimethoprim 800-160 MG tablet Commonly known as: BACTRIM DS Stopped by: Maria Espy, MD   tamsulosin 0.4 MG Caps capsule Commonly known as: FLOMAX Stopped by: Maria Espy, MD       TAKE these medications    buPROPion 150 MG 24 hr tablet Commonly known as: WELLBUTRIN XL Take 150 mg by mouth every morning.   dicyclomine 20 MG tablet Commonly known as: BENTYL Take 20 mg by mouth as needed.   hyoscyamine 0.125 MG/ML solution Commonly known as: LEVSIN Take 0.125 mg by mouth 3 (three) times daily.   levonorgestrel 20 MCG/24HR IUD Commonly known as: MIRENA 1 each by Intrauterine route once.   pantoprazole 40 MG tablet Commonly known as: PROTONIX Take 40 mg by mouth every morning.   sertraline 100 MG tablet Commonly known as: ZOLOFT  Take 100 mg by mouth 2 (two) times daily.   traZODone 50 MG tablet Commonly known as: DESYREL Take 50 mg by mouth at bedtime.        Allergies:  Allergies  Allergen Reactions   Compazine [Prochlorperazine] Other (See Comments)    Panic attack   Ceftin [Cefuroxime Axetil] Rash    Tolerated cefazolin before   Penicillins Rash    TOLERATED CEFAZOLIN BEFORE Has patient had a PCN reaction causing immediate rash, facial/tongue/throat swelling, SOB or lightheadedness with hypotension: Yes Has patient had a PCN reaction causing severe rash involving mucus membranes or skin necrosis: No Has patient had a PCN reaction that required hospitalization No Has patient had a PCN reaction occurring within the last 10 years: No If all of the  above answers are "NO", then may proceed with Cephalosporin use.    Family History: Family History  Problem Relation Age of Onset   Polycystic ovary syndrome Mother    Diabetes Father    Bladder Cancer Father    Depression Father    Endometriosis Sister    Polycystic ovary syndrome Sister    Breast cancer Paternal Grandmother    Diabetes Paternal Grandmother    Diabetes Paternal Grandfather    Kidney cancer Neg Hx    Prostate cancer Neg Hx     Social History:  reports that she has never smoked. She has never been exposed to tobacco smoke. She has never used smokeless tobacco. She reports that she does not drink alcohol and does not use drugs.   Physical Exam: BP 117/69   Pulse 67   Ht 6\' 1"  (1.854 m)   Wt 228 lb (103.4 kg)   LMP  (LMP Unknown) Comment: preg waiver signed 05/29/2022  BMI 30.08 kg/m   Constitutional:  Alert and oriented, No acute distress. HEENT: [place] AT, moist mucus membranes.  Trachea midline, no masses. Neurologic: Grossly intact, no focal deficits, moving all 4 extremities. Psychiatric: Normal mood and affect.  Pertinent Imaging: Ultrasound renal complete  Narrative CLINICAL DATA:  Left renal stone  EXAM: RENAL / URINARY TRACT ULTRASOUND COMPLETE  COMPARISON:  None Available.  FINDINGS: Right Kidney:  Renal measurements: 13.6 x 5.5 x 6.7 cm = volume: 260 mL. Echogenicity within normal limits. No mass or hydronephrosis visualized.  Left Kidney:  Renal measurements: 16 x 5.4 x 7.4 cm = volume: 334 mL. Echogenicity within normal limits. No mass or hydronephrosis visualized.  Bladder:  Not evaluated due to lack of distention.  Other:  None.  IMPRESSION: The kidneys are unremarkable. The bladder is not evaluated due to lack of distention.      Electronically Signed By: Dorise Bullion III M.D. On: 06/04/2022 18:34  Personally reviewed and agree with radiologic interpretation.  Assessment & Plan:    Nephrolithiasis - We  discussed general stone prevention techniques including drinking plenty water with goal of producing 2.5 L urine daily, increased citric acid intake, avoidance of high oxalate containing foods, and decreased salt intake. Information about dietary recommendations given today.  - Consider metabolic evaluation and stone prevention medication if recurrent stones occur, though caution advised due to her history of bariatric surgery and potential difficulty with medication tolerance. - KUB in 1 year  Return in about 1 year (around 06/11/2023) for KUB.  I have reviewed the above documentation for accuracy and completeness, and I agree with the above.   Maria Espy, MD    [facility] [address], [place], [place] [address] [phone]   

## 2022-06-26 ENCOUNTER — Encounter: Payer: Self-pay | Admitting: Surgery

## 2022-06-26 ENCOUNTER — Ambulatory Visit: Payer: Self-pay | Admitting: Surgery

## 2022-06-26 ENCOUNTER — Ambulatory Visit (INDEPENDENT_AMBULATORY_CARE_PROVIDER_SITE_OTHER): Payer: BC Managed Care – PPO | Admitting: Surgery

## 2022-06-26 VITALS — BP 155/92 | HR 63 | Temp 98.0°F | Ht 73.0 in | Wt 233.0 lb

## 2022-06-26 DIAGNOSIS — K432 Incisional hernia without obstruction or gangrene: Secondary | ICD-10-CM | POA: Insufficient documentation

## 2022-06-26 NOTE — H&P (View-Only) (Signed)
Patient ID: Maria Fuller, female   DOB: [birth year], 37 y.o.   MRN: [id number]  Chief Complaint: Recurrent right groin bulge  History of Present Illness Maria Fuller is a 37 y.o. female with an acute incisional hernia that developed shortly after laparoscopic surgery approximate 11 months ago.  Had emergent repair for incarceration.  Due to its incisional nature and small and acute presentation, felt mesh reinforcement unnecessary at that time.  However she has since had a CT scan for nephrolithiasis approximately a month ago, without evidence of recurrent hernia at that time.  However over the last month she has developed a bulge in the right groin.  Readily reducible, readily recurs.  Denies fevers chills nausea or vomiting.  Past Medical History Past Medical History:  Diagnosis Date   Anemia    slight   Bradycardia    Depression    Diabetes mellitus without complication ([place])    resolved with weight loss from gastric bypass   Headache    migraines. resolved since gastric bypass   High cholesterol    resolved since weight loss surgery   History of kidney stones    last week   Hypertension    resolved since gastric bypass, severe pre-eclampsia with G1   Low BP    Meningitis 2015   PCOS (polycystic ovarian syndrome)    Preterm labor    Sleep apnea    had gastric bypass surgery and now resolved   Syncope    Vertigo couple years ago      Past Surgical History:  Procedure Laterality Date   CESAREAN SECTION     CESAREAN SECTION N/A 05/07/2016   Procedure: CESAREAN SECTION;  Surgeon: Gae Dry, MD;  Location: [facility];  Service: Obstetrics;  Laterality: N/A;  Time of birth: [birth year] Sex: Female Weight: 3940 kg, 8 lb 11 oz   CHOLECYSTECTOMY     CYSTOSCOPY WITH STENT PLACEMENT Right 09/19/2016   Procedure: CYSTOSCOPY WITH STENT PLACEMENT;  Surgeon: Nickie Retort, MD;  Location: [facility];  Service: Urology;  Laterality: Right;   CYSTOSCOPY WITH STENT PLACEMENT Left  04/18/2022   Procedure: CYSTOSCOPY WITH STENT PLACEMENT;  Surgeon: Irine Seal, MD;  Location: [facility];  Service: Urology;  Laterality: Left;   CYSTOSCOPY/URETEROSCOPY/HOLMIUM LASER/STENT PLACEMENT Left 05/08/2022   Procedure: CYSTOSCOPY/URETEROSCOPY/HOLMIUM LASER/STENT EXCHANGE;  Surgeon: [name], MD;  Location: [facility];  Service: Urology;  Laterality: Left;   EXTRACORPOREAL SHOCK WAVE LITHOTRIPSY Left 05/15/2022   Procedure: EXTRACORPOREAL SHOCK WAVE LITHOTRIPSY (ESWL);  Surgeon: [name], MD;  Location: [facility];  Service: Urology;  Laterality: Left;   FLEXIBLE SIGMOIDOSCOPY N/A 07/28/2019   Procedure: FLEXIBLE SIGMOIDOSCOPY;  Surgeon: Lin Landsman, MD;  Location: [facility];  Service: Endoscopy;  Laterality: N/A;   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   LAPAROSCOPIC OVARIAN CYSTECTOMY Right 09/28/2018   Procedure: LAPAROSCOPIC RIGHT OVARIAN CYSTECTOMY;  Surgeon: Gae Dry, MD;  Location: [facility];  Service: Gynecology;  Laterality: Right;   LAPAROSCOPIC UNILATERAL SALPINGO OOPHERECTOMY Right 07/25/2021   Procedure: LAPAROSCOPIC UNILATERAL SALPINGO OOPHORECTOMY;  Surgeon: Gae Dry, MD;  Location: [facility];  Service: Gynecology;  Laterality: Right;   LEFT HEART CATH AND CORONARY ANGIOGRAPHY Left 04/07/2018   Procedure: LEFT HEART CATH AND CORONARY ANGIOGRAPHY;  Surgeon: Yolonda Kida, MD;  Location: [facility];  Service: Cardiovascular;  Laterality: Left;   small bowel obstruction  07/2019   TONSILLECTOMY     URETEROSCOPY WITH HOLMIUM LASER LITHOTRIPSY  Right 09/19/2016   Procedure: URETEROSCOPY WITH HOLMIUM LASER LITHOTRIPSY;  Surgeon: Nickie Retort, MD;  Location: [facility];  Service: Urology;  Laterality: Right;   XI ROBOTIC ASSISTED VENTRAL HERNIA N/A 07/27/2021   Procedure: XI ROBOTIC ASSISTED INCARCERATED INCISIONAL HERNIA REPAIR;  Surgeon: [name], MD;  Location: [facility];  Service: General;  Laterality: N/A;     Allergies  Allergen Reactions   Compazine [Prochlorperazine] Other (See Comments)    Panic attack   Ceftin [Cefuroxime Axetil] Rash    Tolerated cefazolin before   Penicillins Rash    TOLERATED CEFAZOLIN BEFORE Has patient had a PCN reaction causing immediate rash, facial/tongue/throat swelling, SOB or lightheadedness with hypotension: Yes Has patient had a PCN reaction causing severe rash involving mucus membranes or skin necrosis: No Has patient had a PCN reaction that required hospitalization No Has patient had a PCN reaction occurring within the last 10 years: No If all of the above answers are "NO", then may proceed with Cephalosporin use.    Current Outpatient Medications  Medication Sig Dispense Refill   buPROPion (WELLBUTRIN XL) 150 MG 24 hr tablet Take 150 mg by mouth every morning.     dicyclomine (BENTYL) 20 MG tablet Take 20 mg by mouth as needed.     hyoscyamine (LEVSIN) 0.125 MG/ML solution Take 0.125 mg by mouth 3 (three) times daily.     levonorgestrel (MIRENA) 20 MCG/24HR IUD 1 each by Intrauterine route once.     pantoprazole (PROTONIX) 40 MG tablet Take 40 mg by mouth every morning.     sertraline (ZOLOFT) 100 MG tablet Take 100 mg by mouth 2 (two) times daily.     traZODone (DESYREL) 50 MG tablet Take 50 mg by mouth at bedtime.      No current facility-administered medications for this visit.    Family History Family History  Problem Relation Age of Onset   Polycystic ovary syndrome Mother    Diabetes Father    Bladder Cancer Father    Depression Father    Endometriosis Sister    Polycystic ovary syndrome Sister    Breast cancer Paternal Grandmother    Diabetes Paternal Grandmother    Diabetes Paternal Grandfather    Kidney cancer Neg Hx    Prostate cancer Neg Hx       Social History Social History   Tobacco Use   Smoking status: Never    Passive exposure: Never   Smokeless tobacco: Never  Vaping Use   Vaping Use: Never used  Substance  Use Topics   Alcohol use: No   Drug use: No        ROS   Physical Exam Blood pressure (!) 155/92, pulse 63, temperature 98 F (36.7 C), height '6\' 1"'$  (1.854 m), weight 233 lb (105.7 kg), SpO2 96 %. Last Weight  Most recent update: 06/26/2022  1:59 PM    Weight  105.7 kg (233 lb)             CONSTITUTIONAL: Well developed, and evidence of marked weight loss, appropriately responsive and aware without distress.   EYES: Sclera non-icteric.   EARS, NOSE, MOUTH AND THROAT:  The oropharynx is clear. Oral mucosa is pink and moist.    Hearing is intact to voice.  NECK: Trachea is midline, and there is no jugular venous distension.  LYMPH NODES:  Lymph nodes in the neck are not appreciated. RESPIRATORY:  Lungs are clear, and breath sounds are equal bilaterally.  Normal respiratory effort without pathologic use  of accessory muscles. CARDIOVASCULAR: Heart is regular in rate and rhythm.  Well perfused.  GI: The abdomen is evidence of significant weight loss, soft, nontender, and nondistended. There were no palpable masses, except for the right lower quadrant mass that makes the sound of slippery bowel as it is reduced.  MUSCULOSKELETAL:  Symmetrical muscle tone appreciated in all four extremities.    SKIN: Skin turgor is normal. No pathologic skin lesions appreciated.  NEUROLOGIC:  Motor and sensation appear grossly normal.  Cranial nerves are grossly without defect. PSYCH:  Alert and oriented to person, place and time. Affect is appropriate for situation.  Data Reviewed I have personally reviewed what is currently available of the patient's imaging, recent labs and medical records.   Labs:     Latest Ref Rng & Units 05/27/2022    3:22 PM 05/14/2022   12:39 AM 04/18/2022    9:41 PM  CBC  WBC 4.0 - 10.5 K/uL 6.8  9.5  6.5   Hemoglobin 12.0 - 15.0 g/dL 9.1  8.1  10.8   Hematocrit 36.0 - 46.0 % 29.1  25.9  32.5   Platelets 150 - 400 K/uL 421  362  241       Latest Ref Rng & Units  05/27/2022    3:22 PM 05/14/2022   12:39 AM 04/18/2022    9:41 PM  CMP  Glucose 70 - 99 mg/dL 82  94    BUN 6 - 20 mg/dL 10  18    Creatinine 0.44 - 1.00 mg/dL 0.64  0.89  0.62   Sodium 135 - 145 mmol/L 138  138    Potassium 3.5 - 5.1 mmol/L 4.1  3.8    Chloride 98 - 111 mmol/L 109  111    CO2 22 - 32 mmol/L 23  20    Calcium 8.9 - 10.3 mg/dL 8.2  8.0    Total Protein 6.5 - 8.1 g/dL 6.9     Total Bilirubin 0.3 - 1.2 mg/dL 0.7     Alkaline Phos 38 - 126 U/L 84     AST 15 - 41 U/L 16     ALT 0 - 44 U/L 15       Imaging: Radiological images reviewed:  CLINICAL DATA:  Left lower abdominal pain and left lower back pain.   EXAM: CT ABDOMEN AND PELVIS WITH CONTRAST   TECHNIQUE: Multidetector CT imaging of the abdomen and pelvis was performed using the standard protocol following bolus administration of intravenous contrast.   RADIATION DOSE REDUCTION: This exam was performed according to the departmental dose-optimization program which includes automated exposure control, adjustment of the mA and/or kV according to patient size and/or use of iterative reconstruction technique.   CONTRAST:  188m OMNIPAQUE IOHEXOL 300 MG/ML  SOLN   COMPARISON:  Multiple priors including most recent CT May 14 2022   FINDINGS: Lower chest: No acute abnormality.   Hepatobiliary: Mild hepatomegaly and hepatic steatosis. Gallbladder is surgically absent. No biliary ductal dilation.   Pancreas: No pancreatic ductal dilation or evidence of acute inflammation.   Spleen: No splenomegaly.   Adrenals/Urinary Tract: Bilateral adrenal glands appear normal.   Very mild left hydroureteronephrosis with perinephric and periureteric stranding and interval passage of the ureteral stones seen on prior imaging. Decreased left renal stone burden with the lower pole renal calculus measuring up to 7 mm previously 11 mm. No right-sided hydronephrosis or nephrolithiasis   Urinary bladder is unremarkable  for degree of distension.   Stomach/Bowel: Surgical  changes of prior gastric sleeve. Right lower quadrant enterotomy sutures. No evidence of acute bowel inflammation.   Vascular/Lymphatic: Normal caliber abdominal aorta. Smooth IVC contours. No pathologically enlarged abdominal or pelvic lymph nodes.   Reproductive: Intrauterine device appears appropriate in positioning.   Other: No significant abdominopelvic free fluid.   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 No acute osseous abnormality.   IMPRESSION: 1. Very mild residual left hydroureteronephrosis with perinephric and periureteric stranding and interval passage of the ureteral stones seen on prior imaging. Suggest correlation with laboratory values for superimposed infection. 2. Decreased left renal stone burden with the lower pole renal calculus measuring up to 7 mm previously 11 mm. 3. Mild hepatomegaly and hepatic steatosis.     Electronically Signed   By: Dahlia Bailiff M.D.   On: 05/27/2022 17:54 Within last 24 hrs: No results found.  Assessment    Recurrent right lower quadrant incisional hernia. Patient Active Problem List   Diagnosis Date Noted   Bradycardia 04/19/2022   Left ureteral stone 04/18/2022   Febrile urinary tract infection 04/18/2022   Partial bowel obstruction ([place]) 02/16/2022   Incarcerated incisional hernia 07/27/2021   Major depressive disorder, recurrent, moderate ([place]) 11/16/2020   Internal hernia 08/04/2019   Partial small bowel obstruction ([place]) 08/04/2019   Right ovarian cyst 09/09/2018   Secondary oligomenorrhea 05/25/2018   Rectal pain 05/25/2018   PCOS (polycystic ovarian syndrome) 05/25/2018   Menometrorrhagia 05/25/2018   Hydronephrosis 09/22/2016   Left renal stone 09/22/2016   Left flank pain 12/11/2015   Right flank pain 10/06/2015   Status post bariatric surgery 09/13/2015   History of hypertension 08/30/2015    History of PCOS 08/30/2015   Vitamin D deficiency 04/20/2015   Diabetes mellitus ([place]) 03/27/2015   Dysmenorrhea 05/25/2014   Rectal bleeding 05/25/2014   Contraception 04/07/2014   Dysfunction of both eustachian tubes 04/07/2014   Migraines 04/07/2014   Hypothyroidism 07/22/2013   Major depressive disorder, single episode 07/22/2013   HTN (hypertension) 07/15/2013   Headache 07/10/2013   Anxiety state 03/05/2013   Morbid obesity with BMI of 50.0-59.9, adult ([place]) 03/05/2013   Preventative health care 03/05/2013   Morbid obesity ([place]) 03/05/2013   History of diabetes mellitus, type II 03/04/2013   Diabetes mellitus type 2, uncontrolled 03/04/2013   Obstructive sleep apnea 09/14/2012   Hyperlipidemia 07/28/2012    Plan    Robotic repair of recurrent anterior abdominal wall hernia, reducible, 3 to 10 cm fascial defect.  I discussed possibility of incarceration, strangulation, enlargement in size over time, and the need for emergency surgery in the face of these.  Also reviewed the techniques of reduction should incarceration occur, and when unsuccessful to present to the ED.  Also discussed that surgery risks include recurrence which can be up to 30% in the case of complex hernias, use of prosthetic materials (mesh) and the increased risk of infection and the possible need for re-operation and removal of mesh, possibility of post-op SBO or ileus, and the risks of general anesthetic including heart attack, stroke, sudden death or some reaction to anesthetic medications. The patient, and those present, appear to understand the risks, any and all questions were answered to the patient's satisfaction.  No guarantees were ever expressed or implied.   Face-to-face time spent with the patient and accompanying care providers(if present) was 30 minutes, with more than 50% of the time spent counseling, educating, and coordinating care of the patient.    These notes  generated with voice recognition  software. I apologize for typographical errors.  [name] M.D., FACS 06/26/2022, 10:14 PM

## 2022-06-26 NOTE — Progress Notes (Signed)
Patient ID: Maria Fuller, female   DOB: 1986-01-02, 37 y.o.   MRN: [id number]  Chief Complaint: Recurrent right groin bulge  History of Present Illness Maria Fuller is a 37 y.o. female with an acute incisional hernia that developed shortly after laparoscopic surgery approximate 11 months ago.  Had emergent repair for incarceration.  Due to its incisional nature and small and acute presentation, felt mesh reinforcement unnecessary at that time.  However she has since had a CT scan for nephrolithiasis approximately a month ago, without evidence of recurrent hernia at that time.  However over the last month she has developed a bulge in the right groin.  Readily reducible, readily recurs.  Denies fevers chills nausea or vomiting.  Past Medical History Past Medical History:  Diagnosis Date   Anemia    slight   Bradycardia    Depression    Diabetes mellitus without complication ([place])    resolved with weight loss from gastric bypass   Headache    migraines. resolved since gastric bypass   High cholesterol    resolved since weight loss surgery   History of kidney stones    last week   Hypertension    resolved since gastric bypass, severe pre-eclampsia with G1   Low BP    Meningitis 2015   PCOS (polycystic ovarian syndrome)    Preterm labor    Sleep apnea    had gastric bypass surgery and now resolved   Syncope    Vertigo couple years ago      Past Surgical History:  Procedure Laterality Date   CESAREAN SECTION     CESAREAN SECTION N/A 05/07/2016   Procedure: CESAREAN SECTION;  Surgeon: Gae Dry, MD;  Location: [facility];  Service: Obstetrics;  Laterality: N/A;  Time of birth: [birth year] Sex: Female Weight: 3940 kg, 8 lb 11 oz   CHOLECYSTECTOMY     CYSTOSCOPY WITH STENT PLACEMENT Right 09/19/2016   Procedure: CYSTOSCOPY WITH STENT PLACEMENT;  Surgeon: Nickie Retort, MD;  Location: [facility];  Service: Urology;  Laterality: Right;   CYSTOSCOPY WITH STENT PLACEMENT Left  04/18/2022   Procedure: CYSTOSCOPY WITH STENT PLACEMENT;  Surgeon: Irine Seal, MD;  Location: [facility];  Service: Urology;  Laterality: Left;   CYSTOSCOPY/URETEROSCOPY/HOLMIUM LASER/STENT PLACEMENT Left 05/08/2022   Procedure: CYSTOSCOPY/URETEROSCOPY/HOLMIUM LASER/STENT EXCHANGE;  Surgeon: [name], MD;  Location: [facility];  Service: Urology;  Laterality: Left;   EXTRACORPOREAL SHOCK WAVE LITHOTRIPSY Left 05/15/2022   Procedure: EXTRACORPOREAL SHOCK WAVE LITHOTRIPSY (ESWL);  Surgeon: [name], MD;  Location: [facility];  Service: Urology;  Laterality: Left;   FLEXIBLE SIGMOIDOSCOPY N/A 07/28/2019   Procedure: FLEXIBLE SIGMOIDOSCOPY;  Surgeon: Lin Landsman, MD;  Location: [place];  Service: Endoscopy;  Laterality: N/A;   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   LAPAROSCOPIC OVARIAN CYSTECTOMY Right 09/28/2018   Procedure: LAPAROSCOPIC RIGHT OVARIAN CYSTECTOMY;  Surgeon: Gae Dry, MD;  Location: [facility];  Service: Gynecology;  Laterality: Right;   LAPAROSCOPIC UNILATERAL SALPINGO OOPHERECTOMY Right 07/25/2021   Procedure: LAPAROSCOPIC UNILATERAL SALPINGO OOPHORECTOMY;  Surgeon: Gae Dry, MD;  Location: [facility];  Service: Gynecology;  Laterality: Right;   LEFT HEART CATH AND CORONARY ANGIOGRAPHY Left 04/07/2018   Procedure: LEFT HEART CATH AND CORONARY ANGIOGRAPHY;  Surgeon: Yolonda Kida, MD;  Location: [place] [facility];  Service: Cardiovascular;  Laterality: Left;   small bowel obstruction  07/2019   TONSILLECTOMY     URETEROSCOPY WITH HOLMIUM LASER LITHOTRIPSY  Right 09/19/2016   Procedure: URETEROSCOPY WITH HOLMIUM LASER LITHOTRIPSY;  Surgeon: Nickie Retort, MD;  Location: [facility];  Service: Urology;  Laterality: Right;   XI ROBOTIC ASSISTED VENTRAL HERNIA N/A 07/27/2021   Procedure: XI ROBOTIC ASSISTED INCARCERATED INCISIONAL HERNIA REPAIR;  Surgeon: [name], MD;  Location: [facility];  Service: General;  Laterality: N/A;     Allergies  Allergen Reactions   Compazine [Prochlorperazine] Other (See Comments)    Panic attack   Ceftin [Cefuroxime Axetil] Rash    Tolerated cefazolin before   Penicillins Rash    TOLERATED CEFAZOLIN BEFORE Has patient had a PCN reaction causing immediate rash, facial/tongue/throat swelling, SOB or lightheadedness with hypotension: Yes Has patient had a PCN reaction causing severe rash involving mucus membranes or skin necrosis: No Has patient had a PCN reaction that required hospitalization No Has patient had a PCN reaction occurring within the last 10 years: No If all of the above answers are "NO", then may proceed with Cephalosporin use.    Current Outpatient Medications  Medication Sig Dispense Refill   buPROPion (WELLBUTRIN XL) 150 MG 24 hr tablet Take 150 mg by mouth every morning.     dicyclomine (BENTYL) 20 MG tablet Take 20 mg by mouth as needed.     hyoscyamine (LEVSIN) 0.125 MG/ML solution Take 0.125 mg by mouth 3 (three) times daily.     levonorgestrel (MIRENA) 20 MCG/24HR IUD 1 each by Intrauterine route once.     pantoprazole (PROTONIX) 40 MG tablet Take 40 mg by mouth every morning.     sertraline (ZOLOFT) 100 MG tablet Take 100 mg by mouth 2 (two) times daily.     traZODone (DESYREL) 50 MG tablet Take 50 mg by mouth at bedtime.      No current facility-administered medications for this visit.    Family History Family History  Problem Relation Age of Onset   Polycystic ovary syndrome Mother    Diabetes Father    Bladder Cancer Father    Depression Father    Endometriosis Sister    Polycystic ovary syndrome Sister    Breast cancer Paternal Grandmother    Diabetes Paternal Grandmother    Diabetes Paternal Grandfather    Kidney cancer Neg Hx    Prostate cancer Neg Hx       Social History Social History   Tobacco Use   Smoking status: Never    Passive exposure: Never   Smokeless tobacco: Never  Vaping Use   Vaping Use: Never used  Substance  Use Topics   Alcohol use: No   Drug use: No        ROS   Physical Exam Blood pressure (!) 155/92, pulse 63, temperature 98 F (36.7 C), height 6' 1"$  (1.854 m), weight 233 lb (105.7 kg), SpO2 96 %. Last Weight  Most recent update: 06/26/2022  1:59 PM    Weight  105.7 kg (233 lb)             CONSTITUTIONAL: Well developed, and evidence of marked weight loss, appropriately responsive and aware without distress.   EYES: Sclera non-icteric.   EARS, NOSE, MOUTH AND THROAT:  The oropharynx is clear. Oral mucosa is pink and moist.    Hearing is intact to voice.  NECK: Trachea is midline, and there is no jugular venous distension.  LYMPH NODES:  Lymph nodes in the neck are not appreciated. RESPIRATORY:  Lungs are clear, and breath sounds are equal bilaterally.  Normal respiratory effort without pathologic use  of accessory muscles. CARDIOVASCULAR: Heart is regular in rate and rhythm.  Well perfused.  GI: The abdomen is evidence of significant weight loss, soft, nontender, and nondistended. There were no palpable masses, except for the right lower quadrant mass that makes the sound of slippery bowel as it is reduced.  MUSCULOSKELETAL:  Symmetrical muscle tone appreciated in all four extremities.    SKIN: Skin turgor is normal. No pathologic skin lesions appreciated.  NEUROLOGIC:  Motor and sensation appear grossly normal.  Cranial nerves are grossly without defect. PSYCH:  Alert and oriented to person, place and time. Affect is appropriate for situation.  Data Reviewed I have personally reviewed what is currently available of the patient's imaging, recent labs and medical records.   Labs:     Latest Ref Rng & Units 05/27/2022    3:22 PM 05/14/2022   12:39 AM 04/18/2022    9:41 PM  CBC  WBC 4.0 - 10.5 K/uL 6.8  9.5  6.5   Hemoglobin 12.0 - 15.0 g/dL 9.1  8.1  10.8   Hematocrit 36.0 - 46.0 % 29.1  25.9  32.5   Platelets 150 - 400 K/uL 421  362  241       Latest Ref Rng & Units  05/27/2022    3:22 PM 05/14/2022   12:39 AM 04/18/2022    9:41 PM  CMP  Glucose 70 - 99 mg/dL 82  94    BUN 6 - 20 mg/dL 10  18    Creatinine 0.44 - 1.00 mg/dL 0.64  0.89  0.62   Sodium 135 - 145 mmol/L 138  138    Potassium 3.5 - 5.1 mmol/L 4.1  3.8    Chloride 98 - 111 mmol/L 109  111    CO2 22 - 32 mmol/L 23  20    Calcium 8.9 - 10.3 mg/dL 8.2  8.0    Total Protein 6.5 - 8.1 g/dL 6.9     Total Bilirubin 0.3 - 1.2 mg/dL 0.7     Alkaline Phos 38 - 126 U/L 84     AST 15 - 41 U/L 16     ALT 0 - 44 U/L 15       Imaging: Radiological images reviewed:  CLINICAL DATA:  Left lower abdominal pain and left lower back pain.   EXAM: CT ABDOMEN AND PELVIS WITH CONTRAST   TECHNIQUE: Multidetector CT imaging of the abdomen and pelvis was performed using the standard protocol following bolus administration of intravenous contrast.   RADIATION DOSE REDUCTION: This exam was performed according to the departmental dose-optimization program which includes automated exposure control, adjustment of the mA and/or kV according to patient size and/or use of iterative reconstruction technique.   CONTRAST:  174m OMNIPAQUE IOHEXOL 300 MG/ML  SOLN   COMPARISON:  Multiple priors including most recent CT May 14 2022   FINDINGS: Lower chest: No acute abnormality.   Hepatobiliary: Mild hepatomegaly and hepatic steatosis. Gallbladder is surgically absent. No biliary ductal dilation.   Pancreas: No pancreatic ductal dilation or evidence of acute inflammation.   Spleen: No splenomegaly.   Adrenals/Urinary Tract: Bilateral adrenal glands appear normal.   Very mild left hydroureteronephrosis with perinephric and periureteric stranding and interval passage of the ureteral stones seen on prior imaging. Decreased left renal stone burden with the lower pole renal calculus measuring up to 7 mm previously 11 mm. No right-sided hydronephrosis or nephrolithiasis   Urinary bladder is unremarkable  for degree of distension.   Stomach/Bowel: Surgical  changes of prior gastric sleeve. Right lower quadrant enterotomy sutures. No evidence of acute bowel inflammation.   Vascular/Lymphatic: Normal caliber abdominal aorta. Smooth IVC contours. No pathologically enlarged abdominal or pelvic lymph nodes.   Reproductive: Intrauterine device appears appropriate in positioning.   Other: No significant abdominopelvic free fluid.   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 No acute osseous abnormality.   IMPRESSION: 1. Very mild residual left hydroureteronephrosis with perinephric and periureteric stranding and interval passage of the ureteral stones seen on prior imaging. Suggest correlation with laboratory values for superimposed infection. 2. Decreased left renal stone burden with the lower pole renal calculus measuring up to 7 mm previously 11 mm. 3. Mild hepatomegaly and hepatic steatosis.     Electronically Signed   By: Dahlia Bailiff M.D.   On: 05/27/2022 17:54 Within last 24 hrs: No results found.  Assessment    Recurrent right lower quadrant incisional hernia. Patient Active Problem List   Diagnosis Date Noted   Bradycardia 04/19/2022   Left ureteral stone 04/18/2022   Febrile urinary tract infection 04/18/2022   Partial bowel obstruction ([place]) 02/16/2022   Incarcerated incisional hernia 07/27/2021   Major depressive disorder, recurrent, moderate ([place]) 11/16/2020   Internal hernia 08/04/2019   Partial small bowel obstruction ([place]) 08/04/2019   Right ovarian cyst 09/09/2018   Secondary oligomenorrhea 05/25/2018   Rectal pain 05/25/2018   PCOS (polycystic ovarian syndrome) 05/25/2018   Menometrorrhagia 05/25/2018   Hydronephrosis 09/22/2016   Left renal stone 09/22/2016   Left flank pain 12/11/2015   Right flank pain 10/06/2015   Status post bariatric surgery 09/13/2015   History of hypertension 08/30/2015    History of PCOS 08/30/2015   Vitamin D deficiency 04/20/2015   Diabetes mellitus ([place]) 03/27/2015   Dysmenorrhea 05/25/2014   Rectal bleeding 05/25/2014   Contraception 04/07/2014   Dysfunction of both eustachian tubes 04/07/2014   Migraines 04/07/2014   Hypothyroidism 07/22/2013   Major depressive disorder, single episode 07/22/2013   HTN (hypertension) 07/15/2013   Headache 07/10/2013   Anxiety state 03/05/2013   Morbid obesity with BMI of 50.0-59.9, adult ([place]) 03/05/2013   Preventative health care 03/05/2013   Morbid obesity ([place]) 03/05/2013   History of diabetes mellitus, type II 03/04/2013   Diabetes mellitus type 2, uncontrolled 03/04/2013   Obstructive sleep apnea 09/14/2012   Hyperlipidemia 07/28/2012    Plan    Robotic repair of recurrent anterior abdominal wall hernia, reducible, 3 to 10 cm fascial defect.  I discussed possibility of incarceration, strangulation, enlargement in size over time, and the need for emergency surgery in the face of these.  Also reviewed the techniques of reduction should incarceration occur, and when unsuccessful to present to the ED.  Also discussed that surgery risks include recurrence which can be up to 30% in the case of complex hernias, use of prosthetic materials (mesh) and the increased risk of infection and the possible need for re-operation and removal of mesh, possibility of post-op SBO or ileus, and the risks of general anesthetic including heart attack, stroke, sudden death or some reaction to anesthetic medications. The patient, and those present, appear to understand the risks, any and all questions were answered to the patient's satisfaction.  No guarantees were ever expressed or implied.   Face-to-face time spent with the patient and accompanying care providers(if present) was 30 minutes, with more than 50% of the time spent counseling, educating, and coordinating care of the patient.    These notes  generated with voice recognition  software. I apologize for typographical errors.  [name] M.D., FACS 06/26/2022, 10:14 PM

## 2022-06-26 NOTE — Patient Instructions (Signed)
You have requested to have a Ventral Hernia Repair. This will be done by Dr. Christian Mate at [facility]. Please see your (BLUE) Pre-care sheet for more information. Our surgery scheduler will call you to verify surgery date and to go over information.   You will need to arrange to be out of work for approximately 1-2 weeks and then you may return with a lifting restriction for 4 more weeks. If you have FMLA or Disability paperwork that needs to be filled out, please have your company fax your paperwork to [phone] or you may drop this by either office. This paperwork will be filled out within 3 days after your surgery has been completed.  Ventral Hernia A ventral hernia (also called an incisional hernia) is a hernia that occurs at the site of a previous surgical cut (incision) in the abdomen. The abdominal wall spans from your lower chest down to your pelvis. If the abdominal wall is weakened from a surgical incision, a hernia can occur. A hernia is a bulge of bowel or muscle tissue pushing out on the weakened part of the abdominal wall. Ventral hernias can get bigger from straining or lifting. Obese and older people are at higher risk for a ventral hernia. People who develop infections after surgery or require repeat incisions at the same site on the abdomen are also at increased risk. CAUSES  A ventral hernia occurs because of weakness in the abdominal wall at an incision site.  SYMPTOMS  Common symptoms include: A visible bulge or lump on the abdominal wall. Pain or tenderness around the lump. Increased discomfort if you cough or make a sudden movement. If the hernia has blocked part of the intestine, a serious complication can occur (incarcerated or strangulated hernia). This can become a problem that requires emergency surgery because the blood flow to the blocked intestine may be cut off. Symptoms may include: Feeling sick to your stomach (nauseous). Throwing up (vomiting). Stomach swelling  (distention) or bloating. Fever. Rapid heartbeat. DIAGNOSIS  Your health care provider will take a medical history and perform a physical exam. Various tests may be ordered, such as: Blood tests. Urine tests. Ultrasonography. X-rays. Computed tomography (CT). TREATMENT  Watchful waiting may be all that is needed for a smaller hernia that does not cause symptoms. Your health care provider may recommend the use of a supportive belt (truss) that helps to keep the abdominal wall intact. For larger hernias or those that cause pain, surgery to repair the hernia is usually recommended. If a hernia becomes strangulated, emergency surgery needs to be done right away. HOME CARE INSTRUCTIONS Avoid putting pressure or strain on the abdominal area. Avoid heavy lifting. Use good body positioning for physical tasks. Ask your health care provider about proper body positioning. Use a supportive belt as directed by your health care provider. Maintain a healthy weight. Eat foods that are high in fiber, such as whole grains, fruits, and vegetables. Fiber helps prevent difficult bowel movements (constipation). Drink enough fluids to keep your urine clear or pale yellow. Follow up with your health care provider as directed. SEEK MEDICAL CARE IF:  Your hernia seems to be getting larger or more painful. SEEK IMMEDIATE MEDICAL CARE IF:  You have abdominal pain that is sudden and sharp. Your pain becomes severe. You have repeated vomiting. You are sweating a lot. You notice a rapid heartbeat. You develop a fever. MAKE SURE YOU:  Understand these instructions. Will watch your condition. Will get help right away if you  are not doing well or get worse.   This information is not intended to replace advice given to you by your health care provider. Make sure you discuss any questions you have with your health care provider.   Document Released: 04/07/2012 Document Revised: 05/12/2014 Document Reviewed:  04/07/2012 Elsevier Interactive Patient Education 2016 Elsevier Inc.    Laparoscopic Ventral Hernia Repair Laparoscopic ventral hernia repair is a surgery to fix a ventral hernia. A ventral hernia, also called an incisional hernia, is a bulge of body tissue or intestines that pushes through the front part of the abdomen. This can happen if the connective tissue covering the muscles over the abdomen has a weak spot or is torn because of a surgical cut (incision) from a previous surgery. Laparoscopic ventral hernia repair is often done soon after diagnosis to stop the hernia from getting bigger, becoming uncomfortable, or becoming an emergency. This surgery usually takes about 2 hours, but the time can vary greatly. LET [facility] CARE PROVIDER KNOW ABOUT: Any allergies you have. All medicines you are taking, including steroids, vitamins, herbs, eye drops, creams, and over-the-counter medicines. Previous problems you or members of your family have had with the use of anesthetics. Any blood disorders you have. Previous surgeries you have had. Medical conditions you have. RISKS AND COMPLICATIONS  Generally, laparoscopic ventral hernia repair is a safe procedure. However, as with any surgical procedure, problems can occur. Possible problems include: Bleeding. Trouble passing urine or having a bowel movement after the surgery. Infection. Pneumonia. Blood clots. Pain in the area of the hernia. A bulge in the area of the hernia that may be caused by a collection of fluid. Injury to intestines or other structures in the abdomen. Return of the hernia after surgery. In some cases, your health care provider may need to stop the laparoscopic procedure and do regular, open surgery. This may be necessary for very difficult hernias, when organs are hard to see, or when bleeding problems occur during surgery. BEFORE THE PROCEDURE  You may need to have blood tests, urine tests, a chest X-ray, or an  electrocardiogram done before the day of the surgery. Ask your health care provider about changing or stopping your regular medicines. This is especially important if you are taking diabetes medicines or blood thinners. You may need to wash with a special type of germ-killing soap. Do not eat or drink anything after midnight the night before the procedure or as directed by your health care provider. Make plans to have someone drive you home after the procedure. PROCEDURE  Small monitors will be put on your body. They are used to check your heart, blood pressure, and oxygen level. An IV access tube will be put into a vein in your hand or arm. Fluids and medicine will flow directly into your body through the IV tube. You will be given medicine that makes you go to sleep (general anesthetic). Your abdomen will be cleaned with a special soap to kill any germs on your skin. Once you are asleep, several small incisions will be made in your abdomen. The large space in your abdomen will be filled with air so that it expands. This gives your health care provider more room and a better view. A thin, lighted tube with a tiny camera on the end (laparoscope) is put through a small incision in your abdomen. The camera on the laparoscope sends a picture to a TV screen in the operating room. This gives your health care provider  a good view inside your abdomen. Hollow tubes are put through the other small incisions in your abdomen. The tools needed for the procedure are put through these tubes. Your health care provider puts the tissue or intestines that formed the hernia back in place. A screen-like patch (mesh) is used to close the hernia. This helps make the area stronger. Stitches, tacks, or staples are used to keep the mesh in place. Medicine and a bandage (dressing) or skin glue will be put over the incisions. AFTER THE PROCEDURE  You will stay in a recovery area until the anesthetic wears off. Your blood  pressure and pulse will be checked often. You may be able to go home the same day or may need to stay in the hospital for 1-2 days after surgery. Your health care provider will decide when you can go home. You may feel some pain. You may be given medicine for pain. You will be urged to do breathing exercises that involve taking deep breaths. This helps prevent a lung infection after a surgery. You may have to wear compression stockings while you are in the hospital. These stockings help keep blood clots from forming in your legs.   This information is not intended to replace advice given to you by your health care provider. Make sure you discuss any questions you have with your health care provider.   Document Released: 04/07/2012 Document Revised: 04/26/2013 Document Reviewed: 04/07/2012 Elsevier Interactive Patient Education [employer].

## 2022-06-27 ENCOUNTER — Telehealth: Payer: Self-pay

## 2022-06-27 NOTE — Telephone Encounter (Signed)
Message left for the patient to call back about her surgery information.  Pre-Admission date/time, and Surgery date at [facility].  Surgery Date: 07/07/22 Preadmission Testing Date: 07/01/22 (phone 8A-1P)  Patient will need to call [phone], between 1-3:00pm the day before surgery, to find out what time to arrive for surgery.

## 2022-06-30 ENCOUNTER — Other Ambulatory Visit: Payer: Self-pay | Admitting: Surgery

## 2022-06-30 DIAGNOSIS — K432 Incisional hernia without obstruction or gangrene: Secondary | ICD-10-CM

## 2022-06-30 NOTE — H&P (View-Only) (Signed)
Updated to robotic on consent.

## 2022-06-30 NOTE — Progress Notes (Signed)
Updated to robotic on consent.

## 2022-06-30 NOTE — Telephone Encounter (Signed)
Called patient back, she is now informed of all dates regarding her surgery.

## 2022-07-01 ENCOUNTER — Other Ambulatory Visit: Payer: Self-pay

## 2022-07-01 ENCOUNTER — Encounter
Admission: RE | Admit: 2022-07-01 | Discharge: 2022-07-01 | Disposition: A | Discharge status: Home / Self Care | Payer: BC Managed Care – PPO | Source: Ambulatory Visit | Attending: Surgery | Admitting: Surgery

## 2022-07-01 DIAGNOSIS — K432 Incisional hernia without obstruction or gangrene: Secondary | ICD-10-CM

## 2022-07-01 HISTORY — DX: Incisional hernia without obstruction or gangrene: K43.2

## 2022-07-01 NOTE — Patient Instructions (Addendum)
Your procedure is scheduled on: 07/07/2022  Report to the Registration Desk on the 1st floor of the [facility]. To find out your arrival time, please call [phone] between 1PM - 3PM on: 07/04/2022  If your arrival time is 6:00 am, do not arrive before that time as the [facility] entrance doors do not open until 6:00 am.  REMEMBER: Instructions that are not followed completely may result in serious medical risk, up to and including death; or upon the discretion of your surgeon and anesthesiologist your surgery may need to be rescheduled.  Do not eat food after midnight the night before surgery.  No gum chewing or hard candies.   One week prior to surgery: Stop Anti-inflammatories (NSAIDS) such as Advil, Aleve, Ibuprofen, Motrin, Naproxen, Naprosyn and Aspirin based products such as Excedrin, Goody's Powder, BC Powder. Stop ANY OVER THE COUNTER supplements until after surgery. You may however, continue to take Tylenol if needed for pain up until the day of surgery.  Continue taking all prescribed medications as usual BUT only take these medications on day of surgery. TAKE ONLY THESE MEDICATIONS THE MORNING OF SURGERY WITH A SIP OF WATER:  buPROPion (WELLBUTRIN XL)  2.  sertraline (ZOLOFT 3. pantoprazole (PROTONIX) (take one the night before and one on the morning of surgery - helps to prevent nausea after surgery.)    No Alcohol for 24 hours before or after surgery.  No Smoking including e-cigarettes for 24 hours before surgery.  No chewable tobacco products for at least 6 hours before surgery.  No nicotine patches on the day of surgery.  Do not use any "recreational" drugs for at least a week (preferably 2 weeks) before your surgery.  Please be advised that the combination of cocaine and anesthesia may have negative outcomes, up to and including death. If you test positive for cocaine, your surgery will be cancelled.  On the morning of surgery brush your teeth with toothpaste  and water, you may rinse your mouth with mouthwash if you wish. Do not swallow any toothpaste or mouthwash.  Use CHG Soap as directed on instruction sheet.  Do not wear jewelry, make-up, hairpins, clips or nail polish.  Do not wear lotions, powders, or perfumes.   Do not shave body hair from the neck down 48 hours before surgery.  Contact lenses, hearing aids and dentures may not be worn into surgery.  Do not bring valuables to the hospital. [facility] is not responsible for any missing/lost belongings or valuables.     Notify your doctor if there is any change in your medical condition (cold, fever, infection).  Wear comfortable clothing (specific to your surgery type) to the hospital.  After surgery, you can help prevent lung complications by doing breathing exercises.  Take deep breaths and cough every 1-2 hours. Your doctor may order a device called an Incentive Spirometer to help you take deep breaths.  If you are being discharged the day of surgery, you will not be allowed to drive home. You will need a responsible individual to drive you home and stay with you for 24 hours after surgery.    Please call the [facility]. at [phone] if you have any questions about these instructions.  Surgery Visitation Policy:  Patients undergoing a surgery or procedure may have two family members or support persons with them as long as the person is not COVID-19 positive or experiencing its symptoms.   Due to an increase in RSV and influenza rates  and associated hospitalizations, children ages 35 and under will not be able to visit patients in [facility]. Masks continue to be strongly recommended.Your procedure is scheduled on: Report to the Registration Desk on the 1st floor of the [facility]. To find out your arrival time, please call [phone] between 1PM - 3PM on: If your arrival time is 6:00 am, do not arrive before that time as the [facility] entrance doors do not open until 6:00 am.       Preparing for Surgery with CHLORHEXIDINE GLUCONATE (CHG) Soap  Chlorhexidine Gluconate (CHG) Soap  o An antiseptic cleaner that kills germs and bonds with the skin to continue killing germs even after washing  o Used for showering the night before surgery and morning of surgery  Before surgery, you can play an important role by reducing the number of germs on your skin.  CHG (Chlorhexidine gluconate) soap is an antiseptic cleanser which kills germs and bonds with the skin to continue killing germs even after washing.  Please do not use if you have an allergy to CHG or antibacterial soaps. If your skin becomes reddened/irritated stop using the CHG.  1. Shower the NIGHT BEFORE SURGERY and the MORNING OF SURGERY with CHG soap.  2. If you choose to wash your hair, wash your hair first as usual with your normal shampoo.  3. After shampooing, rinse your hair and body thoroughly to remove the shampoo.  4. Use CHG as you would any other liquid soap. You can apply CHG directly to the skin and wash gently with a scrungie or a clean washcloth.  5. Apply the CHG soap to your body only from the neck down. Do not use on open wounds or open sores. Avoid contact with your eyes, ears, mouth, and genitals (private parts). Wash face and genitals (private parts) with your normal soap.  6. Wash thoroughly, paying special attention to the area where your surgery will be performed.  7. Thoroughly rinse your body with warm water.  8. Do not shower/wash with your normal soap after using and rinsing off the CHG soap.  9. Pat yourself dry with a clean towel.  10. Wear clean pajamas to bed the night before surgery.  12. Place clean sheets on your bed the night of your first shower and do not sleep with pets.  13. Shower again with the CHG soap on the day of surgery prior to arriving at the hospital.  14. Do not apply any  deodorants/lotions/powders.  15. Please wear clean clothes to the hospital.

## 2022-07-06 MED ORDER — CHLORHEXIDINE GLUCONATE CLOTH 2 % EX PADS
6.0000 | MEDICATED_PAD | Freq: Once | CUTANEOUS | Status: AC
  Administered 2022-07-06: 6 via TOPICAL

## 2022-07-06 MED ORDER — CHLORHEXIDINE GLUCONATE 0.12 % MT SOLN: 15.0000 mL | Freq: Once | OROMUCOSAL | Status: AC

## 2022-07-06 MED ORDER — CEFAZOLIN SODIUM-DEXTROSE 2-4 GM/100ML-% IV SOLN
2.0000 g | INTRAVENOUS | Status: AC
  Administered 2022-07-07: 2 g via INTRAVENOUS

## 2022-07-06 MED ORDER — CELECOXIB 200 MG PO CAPS: 200.0000 mg | ORAL_CAPSULE | ORAL | Status: AC

## 2022-07-06 MED ORDER — GABAPENTIN 300 MG PO CAPS: 300.0000 mg | ORAL_CAPSULE | ORAL | Status: AC

## 2022-07-06 MED ORDER — ORAL CARE MOUTH RINSE: 15.0000 mL | Freq: Once | OROMUCOSAL | Status: AC

## 2022-07-06 MED ORDER — BUPIVACAINE LIPOSOME 1.3 % IJ SUSP: 20.0000 mL | Freq: Once | INTRAMUSCULAR | Status: DC

## 2022-07-06 MED ORDER — ACETAMINOPHEN 500 MG PO TABS: 1000.0000 mg | ORAL_TABLET | ORAL | Status: AC

## 2022-07-06 MED ORDER — CHLORHEXIDINE GLUCONATE CLOTH 2 % EX PADS
6.0000 | MEDICATED_PAD | Freq: Once | CUTANEOUS | Status: AC
  Administered 2022-07-07: 6 via TOPICAL

## 2022-07-06 MED ORDER — LACTATED RINGERS IV SOLN: INTRAVENOUS | Status: DC

## 2022-07-07 ENCOUNTER — Ambulatory Visit: Payer: BC Managed Care – PPO | Admitting: Certified Registered Nurse Anesthetist

## 2022-07-07 ENCOUNTER — Other Ambulatory Visit: Payer: Self-pay

## 2022-07-07 ENCOUNTER — Encounter: Payer: Self-pay | Admitting: Surgery

## 2022-07-07 ENCOUNTER — Encounter
Admission: RE | Disposition: A | Discharge status: Home / Self Care | Payer: Self-pay | Source: Home / Self Care | Attending: Surgery

## 2022-07-07 ENCOUNTER — Ambulatory Visit
Admission: RE | Admit: 2022-07-07 | Discharge: 2022-07-07 | Disposition: A | Discharge status: Home / Self Care | Payer: BC Managed Care – PPO | Attending: Surgery | Admitting: Surgery

## 2022-07-07 DIAGNOSIS — E119 Type 2 diabetes mellitus without complications: Secondary | ICD-10-CM | POA: Insufficient documentation

## 2022-07-07 DIAGNOSIS — K432 Incisional hernia without obstruction or gangrene: Secondary | ICD-10-CM | POA: Diagnosis present

## 2022-07-07 DIAGNOSIS — Z87442 Personal history of urinary calculi: Secondary | ICD-10-CM | POA: Diagnosis not present

## 2022-07-07 DIAGNOSIS — I10 Essential (primary) hypertension: Secondary | ICD-10-CM | POA: Diagnosis not present

## 2022-07-07 DIAGNOSIS — D649 Anemia, unspecified: Secondary | ICD-10-CM | POA: Insufficient documentation

## 2022-07-07 DIAGNOSIS — G4730 Sleep apnea, unspecified: Secondary | ICD-10-CM | POA: Insufficient documentation

## 2022-07-07 DIAGNOSIS — F418 Other specified anxiety disorders: Secondary | ICD-10-CM | POA: Insufficient documentation

## 2022-07-07 DIAGNOSIS — D759 Disease of blood and blood-forming organs, unspecified: Secondary | ICD-10-CM | POA: Insufficient documentation

## 2022-07-07 HISTORY — PX: XI ROBOTIC ASSISTED VENTRAL HERNIA: SHX6789

## 2022-07-07 HISTORY — PX: INSERTION OF MESH: SHX5868

## 2022-07-07 LAB — GLUCOSE, CAPILLARY: Glucose-Capillary: 96 mg/dL (ref 70–99)

## 2022-07-07 LAB — POCT PREGNANCY, URINE: Preg Test, Ur: NEGATIVE

## 2022-07-07 SURGERY — REPAIR, HERNIA, VENTRAL, ROBOT-ASSISTED
Anesthesia: General | Site: Abdomen

## 2022-07-07 MED ORDER — CHLORHEXIDINE GLUCONATE 0.12 % MT SOLN
OROMUCOSAL | Status: AC
  Administered 2022-07-07: 15 mL via OROMUCOSAL
  Filled 2022-07-07: qty 15

## 2022-07-07 MED ORDER — PROPOFOL 10 MG/ML IV BOLUS
INTRAVENOUS | Status: AC
  Filled 2022-07-07: qty 40

## 2022-07-07 MED ORDER — LIDOCAINE HCL (PF) 2 % IJ SOLN
INTRAMUSCULAR | Status: AC
  Filled 2022-07-07: qty 5

## 2022-07-07 MED ORDER — FENTANYL CITRATE (PF) 100 MCG/2ML IJ SOLN
INTRAMUSCULAR | Status: AC
  Filled 2022-07-07: qty 2

## 2022-07-07 MED ORDER — FENTANYL CITRATE (PF) 100 MCG/2ML IJ SOLN
INTRAMUSCULAR | Status: DC | PRN
  Administered 2022-07-07 (×2): 50 ug via INTRAVENOUS

## 2022-07-07 MED ORDER — DEXAMETHASONE SODIUM PHOSPHATE 10 MG/ML IJ SOLN
INTRAMUSCULAR | Status: DC | PRN
  Administered 2022-07-07: 6 mg via INTRAVENOUS

## 2022-07-07 MED ORDER — BUPIVACAINE-EPINEPHRINE (PF) 0.25% -1:200000 IJ SOLN
INTRAMUSCULAR | Status: DC | PRN
  Administered 2022-07-07: 30 mL

## 2022-07-07 MED ORDER — ROCURONIUM BROMIDE 100 MG/10ML IV SOLN
INTRAVENOUS | Status: DC | PRN
  Administered 2022-07-07: 20 mg via INTRAVENOUS
  Administered 2022-07-07: 10 mg via INTRAVENOUS
  Administered 2022-07-07: 50 mg via INTRAVENOUS
  Administered 2022-07-07: 20 mg via INTRAVENOUS
  Administered 2022-07-07: 30 mg via INTRAVENOUS

## 2022-07-07 MED ORDER — GLYCOPYRROLATE 0.2 MG/ML IJ SOLN
INTRAMUSCULAR | Status: AC
  Filled 2022-07-07: qty 1

## 2022-07-07 MED ORDER — SUGAMMADEX SODIUM 500 MG/5ML IV SOLN
INTRAVENOUS | Status: AC
  Filled 2022-07-07: qty 5

## 2022-07-07 MED ORDER — FENTANYL CITRATE (PF) 100 MCG/2ML IJ SOLN
25.0000 ug | INTRAMUSCULAR | Status: DC | PRN
  Administered 2022-07-07 (×4): 25 ug via INTRAVENOUS

## 2022-07-07 MED ORDER — BUPIVACAINE LIPOSOME 1.3 % IJ SUSP
INTRAMUSCULAR | Status: AC
  Filled 2022-07-07: qty 20

## 2022-07-07 MED ORDER — IBUPROFEN 800 MG PO TABS: 800.0000 mg | ORAL_TABLET | Freq: Three times a day (TID) | ORAL | 0 refills | Status: DC | PRN

## 2022-07-07 MED ORDER — LIDOCAINE HCL (CARDIAC) PF 100 MG/5ML IV SOSY
PREFILLED_SYRINGE | INTRAVENOUS | Status: DC | PRN
  Administered 2022-07-07: 100 mg via INTRAVENOUS

## 2022-07-07 MED ORDER — ONDANSETRON HCL 4 MG/2ML IJ SOLN
INTRAMUSCULAR | Status: DC | PRN
  Administered 2022-07-07: 4 mg via INTRAVENOUS

## 2022-07-07 MED ORDER — ONDANSETRON HCL 4 MG/2ML IJ SOLN: 4.0000 mg | Freq: Once | INTRAMUSCULAR | Status: DC | PRN

## 2022-07-07 MED ORDER — ROCURONIUM BROMIDE 10 MG/ML (PF) SYRINGE
PREFILLED_SYRINGE | INTRAVENOUS | Status: AC
  Filled 2022-07-07: qty 10

## 2022-07-07 MED ORDER — BUPIVACAINE-EPINEPHRINE 0.25% -1:200000 IJ SOLN
INTRAMUSCULAR | Status: DC | PRN
  Administered 2022-07-07: 20 mL

## 2022-07-07 MED ORDER — BUPIVACAINE HCL (PF) 0.25 % IJ SOLN
INTRAMUSCULAR | Status: AC
  Filled 2022-07-07: qty 30

## 2022-07-07 MED ORDER — MIDAZOLAM HCL 2 MG/2ML IJ SOLN
INTRAMUSCULAR | Status: DC | PRN
  Administered 2022-07-07: 2 mg via INTRAVENOUS

## 2022-07-07 MED ORDER — CEFAZOLIN SODIUM-DEXTROSE 2-4 GM/100ML-% IV SOLN
INTRAVENOUS | Status: AC
  Filled 2022-07-07: qty 100

## 2022-07-07 MED ORDER — EPINEPHRINE PF 1 MG/ML IJ SOLN
INTRAMUSCULAR | Status: AC
  Filled 2022-07-07: qty 1

## 2022-07-07 MED ORDER — DEXAMETHASONE SODIUM PHOSPHATE 10 MG/ML IJ SOLN
INTRAMUSCULAR | Status: AC
  Filled 2022-07-07: qty 1

## 2022-07-07 MED ORDER — MIDAZOLAM HCL 2 MG/2ML IJ SOLN
INTRAMUSCULAR | Status: AC
  Filled 2022-07-07: qty 2

## 2022-07-07 MED ORDER — EPHEDRINE 5 MG/ML INJ
INTRAVENOUS | Status: AC
  Filled 2022-07-07: qty 5

## 2022-07-07 MED ORDER — SUGAMMADEX SODIUM 500 MG/5ML IV SOLN
INTRAVENOUS | Status: DC | PRN
  Administered 2022-07-07: 211.4 mg via INTRAVENOUS

## 2022-07-07 MED ORDER — ONDANSETRON HCL 4 MG/2ML IJ SOLN
INTRAMUSCULAR | Status: AC
  Filled 2022-07-07: qty 2

## 2022-07-07 MED ORDER — CELECOXIB 200 MG PO CAPS
ORAL_CAPSULE | ORAL | Status: AC
  Administered 2022-07-07: 200 mg via ORAL
  Filled 2022-07-07: qty 1

## 2022-07-07 MED ORDER — GABAPENTIN 300 MG PO CAPS
ORAL_CAPSULE | ORAL | Status: AC
  Administered 2022-07-07: 300 mg via ORAL
  Filled 2022-07-07: qty 1

## 2022-07-07 MED ORDER — ACETAMINOPHEN 500 MG PO TABS
ORAL_TABLET | ORAL | Status: AC
  Administered 2022-07-07: 1000 mg via ORAL
  Filled 2022-07-07: qty 2

## 2022-07-07 MED ORDER — HYDROCODONE-ACETAMINOPHEN 5-325 MG PO TABS: 1.0000 | ORAL_TABLET | Freq: Four times a day (QID) | ORAL | 0 refills | Status: DC | PRN

## 2022-07-07 MED ORDER — PROPOFOL 10 MG/ML IV BOLUS
INTRAVENOUS | Status: DC | PRN
  Administered 2022-07-07: 180 mg via INTRAVENOUS

## 2022-07-07 MED ORDER — EPHEDRINE SULFATE (PRESSORS) 50 MG/ML IJ SOLN
INTRAMUSCULAR | Status: DC | PRN
  Administered 2022-07-07: 10 mg via INTRAVENOUS

## 2022-07-07 MED ORDER — 0.9 % SODIUM CHLORIDE (POUR BTL) OPTIME
TOPICAL | Status: DC | PRN
  Administered 2022-07-07: 500 mL

## 2022-07-07 SURGICAL SUPPLY — 53 items
ADH SKN CLS APL DERMABOND .7 (GAUZE/BANDAGES/DRESSINGS) ×1
APL PRP STRL LF DISP 70% ISPRP (MISCELLANEOUS) ×1
CHLORAPREP W/TINT 26 (MISCELLANEOUS) ×1 IMPLANT
COVER TIP SHEARS 8 DVNC (MISCELLANEOUS) ×1 IMPLANT
COVER TIP SHEARS 8MM DA VINCI (MISCELLANEOUS) ×1
COVER WAND RF STERILE (DRAPES) ×1 IMPLANT
DERMABOND ADVANCED .7 DNX12 (GAUZE/BANDAGES/DRESSINGS) ×1 IMPLANT
DRAPE ARM DVNC X/XI (DISPOSABLE) ×3 IMPLANT
DRAPE COLUMN DVNC XI (DISPOSABLE) ×1 IMPLANT
DRAPE DA VINCI XI ARM (DISPOSABLE) ×3
DRAPE DA VINCI XI COLUMN (DISPOSABLE) ×1
ELECT REM PT RETURN 9FT ADLT (ELECTROSURGICAL) ×1
ELECTRODE REM PT RTRN 9FT ADLT (ELECTROSURGICAL) ×1 IMPLANT
GLOVE ORTHO TXT STRL SZ7.5 (GLOVE) ×2 IMPLANT
GOWN STRL REUS W/ TWL LRG LVL3 (GOWN DISPOSABLE) ×1 IMPLANT
GOWN STRL REUS W/ TWL XL LVL3 (GOWN DISPOSABLE) ×2 IMPLANT
GOWN STRL REUS W/TWL LRG LVL3 (GOWN DISPOSABLE) ×1
GOWN STRL REUS W/TWL XL LVL3 (GOWN DISPOSABLE) ×2
GRASPER SUT TROCAR 14GX15 (MISCELLANEOUS) IMPLANT
IRRIGATION STRYKERFLOW (MISCELLANEOUS) IMPLANT
IRRIGATOR STRYKERFLOW (MISCELLANEOUS)
IV NS IRRIG 3000ML ARTHROMATIC (IV SOLUTION) IMPLANT
KIT PINK PAD W/HEAD ARE REST (MISCELLANEOUS) ×1
KIT PINK PAD W/HEAD ARM REST (MISCELLANEOUS) ×1 IMPLANT
KIT TURNOVER KIT A (KITS) ×1 IMPLANT
MANIFOLD NEPTUNE II (INSTRUMENTS) ×1 IMPLANT
MESH 3DMAX LIGHT 4.8X6.7 RT XL (Mesh General) IMPLANT
NDL INSUFFLATION 14GA 120MM (NEEDLE) ×1 IMPLANT
NEEDLE HYPO 22GX1.5 SAFETY (NEEDLE) ×1 IMPLANT
NEEDLE INSUFFLATION 14GA 120MM (NEEDLE) ×1 IMPLANT
NS IRRIG 500ML POUR BTL (IV SOLUTION) ×1 IMPLANT
PACK LAP CHOLECYSTECTOMY (MISCELLANEOUS) ×1 IMPLANT
SCISSORS METZENBAUM CVD 33 (INSTRUMENTS) IMPLANT
SEAL CANN UNIV 5-8 DVNC XI (MISCELLANEOUS) ×3 IMPLANT
SEAL XI 5MM-8MM UNIVERSAL (MISCELLANEOUS) ×3
SET TUBE SMOKE EVAC HIGH FLOW (TUBING) ×1 IMPLANT
SOL ELECTROSURG ANTI STICK (MISCELLANEOUS) ×1
SOLUTION ELECTROSURG ANTI STCK (MISCELLANEOUS) ×1 IMPLANT
SPIKE FLUID TRANSFER (MISCELLANEOUS) ×1 IMPLANT
SUT MNCRL 4-0 (SUTURE) ×2
SUT MNCRL 4-0 27XMFL (SUTURE) ×2
SUT STRATAFIX 0 PDS+ CT-2 23 (SUTURE) ×1
SUT V-LOC 90 ABS 3-0 VLT  V-20 (SUTURE) ×1
SUT V-LOC 90 ABS 3-0 VLT V-20 (SUTURE) IMPLANT
SUT VIC AB 2-0 SH 27 (SUTURE) ×1
SUT VIC AB 2-0 SH 27XBRD (SUTURE) IMPLANT
SUT VICRYL 0 UR6 27IN ABS (SUTURE) ×1 IMPLANT
SUT VLOC 90 S/L VL9 GS22 (SUTURE) ×2 IMPLANT
SUTURE MNCRL 4-0 27XMF (SUTURE) ×1 IMPLANT
SUTURE STRATFX 0 PDS+ CT-2 23 (SUTURE) ×1 IMPLANT
TRAP FLUID SMOKE EVACUATOR (MISCELLANEOUS) ×1 IMPLANT
TROCAR Z-THREAD FIOS 11X100 BL (TROCAR) ×1 IMPLANT
WATER STERILE IRR 500ML POUR (IV SOLUTION) ×1 IMPLANT

## 2022-07-07 NOTE — Op Note (Signed)
Robotic assisted Laparoscopic Transabdominal PP RLQ recurrent incisional 4 cm fascial defect Hernia Repair with Mesh       Pre-operative Diagnosis:  4 cm AA Wall (recurrent incisional) RLQ Hernia   Post-operative Diagnosis: Same   Procedure: Robotic assisted Laparoscopic  repair of 4 cm AA Wall (recurrent incisional) RLQhernia(s)   Surgeon: [name], M.D., FACS   Anesthesia: GETA   Findings: 4 cm wide by 2 cm cephalad to caudad right lower quadrant recurrent incisional defect. Procedure Details  The patient was seen again in the Holding Room. The benefits, complications, treatment options, and expected outcomes were discussed with the patient. The risks of bleeding, infection, recurrence of symptoms, failure to resolve symptoms, recurrence of hernia, ischemic orchitis, chronic pain syndrome or neuroma, were reviewed again. The likelihood of improving the patient's symptoms with return to their baseline status is good.  The patient and/or family concurred with the proposed plan, giving informed consent.  The patient was taken to Operating Room, identified  and the procedure verified as Laparoscopic Inguinal Hernia Repair. Laterality confirmed.  A Time Out was held and the above information confirmed.   Prior to the induction of general anesthesia, antibiotic prophylaxis was administered. VTE prophylaxis was in place. General endotracheal anesthesia was then administered and tolerated well. After the induction, the abdomen was prepped with Chloraprep and draped in the sterile fashion. The patient was positioned in the supine position.   After local infiltration of quarter percent Marcaine with epinephrine, stab incision was made left upper quadrant.  On the left at Palmer's point, the Veress needle is passed with sensation of the layers to penetrate the abdominal wall and into the peritoneum.  Saline drop test is confirmed peritoneal placement.  Insufflation is initiated with carbon dioxide  to pressures of 15 mmHg. An 8.5 mm port is placed to the left off of the midline, with blunt tipped trocar.  Pneumoperitoneum maintained w/o HD changes using the AirSeal *** to pressures of 15 mm Hg with CO2. No evidence of bowel injuries.  Two 8.5 mm ports placed under direct vision in each upper quadrant. The laparoscopy revealed *** indirect defect(s).   The robot was brought ot the table and docked in the standard fashion, no collision between arms was observed. Instruments were kept under direct view at all times. For *** inguinal hernia repair,  I developed a peritoneal flap. The sac(s) were reduced and dissected free from adjacent structures. We preserved the vas and the vessels, and visualized them to their convergence and beyond in the retroperitoneum. Once dissection was completed a large *** sided BARD 3D Light mesh was placed and secured at three points with interrupted 2-0 Vicryl to the pubic tubercle and anteriorly. There was good coverage of the direct, indirect and femoral spaces.  Second look revealed no complications or injuries.  BILATERAL *** Attention then was turned to the opposite side. The sac was also reduced and dissected free from adjacent structures. We preserved the vas and the vessels, in like manner with adequate posterior dissection. Once dissection was completed the same, but contralateral mesh was placed and secured in like manner with interrupted 2-0 Vicryl. There was good coverage of the direct, indirect and femoral spaces.  The flap was then closed with 3-0 V-lock suture.  Peritoneal closure without defects.  Once assuring that hemostasis was adequate, all needles/sponges removed, and the robot was undocked.  Under direct visualization I placed the Veress needle into the preperitoneal space the Veress' valve was released allowing  extraperitoneal CO2 to escape, it was also used to access the space for supplemental local anesthesia. The ports were removed, the  abdomen desulflated.  4-0 subcuticular Monocryl was used at all skin edges. Dermabond was placed.  Patient tolerated the procedure well. There were no complications. He was taken to the recovery room in stable condition.           [name], M.D., FACS 07/07/2022, 10:15 AM

## 2022-07-07 NOTE — Anesthesia Preprocedure Evaluation (Signed)
Anesthesia Evaluation  Patient identified by MRN, date of birth, ID band Patient awake    Reviewed: Allergy & Precautions, H&P , NPO status , Patient's Chart, lab work & pertinent test results, reviewed documented beta blocker date and time   History of Anesthesia Complications Negative for: history of anesthetic complications  Airway Mallampati: II  TM Distance: >3 FB Neck ROM: full    Dental  (+) Teeth Intact, Dental Advidsory Given   Pulmonary neg shortness of breath, sleep apnea , neg recent URI   Pulmonary exam normal        Cardiovascular Exercise Tolerance: Good hypertension, On Medications (-) angina (-) Past MI negative cardio ROS Normal cardiovascular exam Rhythm:regular Rate:Normal     Neuro/Psych  Headaches PSYCHIATRIC DISORDERS Anxiety Depression       GI/Hepatic negative GI ROS, Neg liver ROS,,,  Endo/Other  diabetes, Well ControlledHypothyroidism    Renal/GU Renal disease  negative genitourinary   Musculoskeletal   Abdominal   Peds  Hematology  (+) Blood dyscrasia, anemia   Anesthesia Other Findings Past Medical History: No date: Anemia     Comment:  slight No date: Bradycardia No date: Depression No date: Diabetes mellitus without complication (HCC)     Comment:  resolved with weight loss from gastric bypass No date: Headache     Comment:  migraines. resolved since gastric bypass No date: High cholesterol     Comment:  resolved since weight loss surgery No date: History of kidney stones     Comment:  last week No date: Hypertension     Comment:  resolved since gastric bypass, severe pre-eclampsia with              G1 No date: Low BP 2015: Meningitis No date: PCOS (polycystic ovarian syndrome) No date: Preterm labor No date: Sleep apnea     Comment:  had gastric bypass surgery and now resolved No date: Syncope couple years ago: Vertigo Past Surgical History: No date: CESAREAN  SECTION 05/07/2016: CESAREAN SECTION; N/A     Comment:  Procedure: CESAREAN SECTION;  Surgeon: Gae Dry,               MD;  Location: [facility];  Service: Obstetrics;                Laterality: N/A;  Time of birth: [birth year] Sex:               Female Weight: 3940 kg, 8 lb 11 oz No date: CHOLECYSTECTOMY 09/19/2016: CYSTOSCOPY WITH STENT PLACEMENT; Right     Comment:  Procedure: [place];  Surgeon:               Nickie Retort, MD;  Location: [facility];  Service:               Urology;  Laterality: Right; 07/28/2019: FLEXIBLE SIGMOIDOSCOPY; N/A     Comment:  Procedure: FLEXIBLE SIGMOIDOSCOPY;  Surgeon: Lin Landsman, MD;  Location: [facility];                Service: Endoscopy;  Laterality: N/A; 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 09/28/2018: LAPAROSCOPIC OVARIAN CYSTECTOMY; Right     Comment:  Procedure: LAPAROSCOPIC RIGHT OVARIAN CYSTECTOMY;                Surgeon: Gae Dry, MD;  Location: [facility];  Service: Gynecology;  Laterality: Right; 07/25/2021: LAPAROSCOPIC UNILATERAL SALPINGO OOPHERECTOMY; Right     Comment:  Procedure: LAPAROSCOPIC UNILATERAL SALPINGO               OOPHORECTOMY;  Surgeon: Gae Dry, MD;  Location:               [facility];  Service: Gynecology;  Laterality: Right; 04/07/2018: LEFT HEART CATH AND CORONARY ANGIOGRAPHY; Left     Comment:  Procedure: LEFT HEART CATH AND CORONARY ANGIOGRAPHY;                Surgeon: Yolonda Kida, MD;  Location: [facility];  Service: Cardiovascular;  Laterality: Left; 07/2019: small bowel obstruction No date: TONSILLECTOMY 09/19/2016: URETEROSCOPY WITH HOLMIUM LASER LITHOTRIPSY; Right     Comment:  Procedure: URETEROSCOPY WITH HOLMIUM LASER LITHOTRIPSY;               Surgeon: Nickie Retort, MD;  Location: [facility];                Service: Urology;  Laterality: Right; 07/27/2021: XI ROBOTIC ASSISTED  VENTRAL HERNIA; N/A     Comment:  Procedure: XI ROBOTIC ASSISTED INCARCERATED INCISIONAL               HERNIA REPAIR;  Surgeon: [name], MD;  Location:              [facility];  Service: General;  Laterality: N/A;   Reproductive/Obstetrics negative OB ROS                             Anesthesia Physical Anesthesia Plan  ASA: 3  Anesthesia Plan: General   Post-op Pain Management:    Induction: Intravenous  PONV Risk Score and Plan: 4 or greater and Ondansetron, Dexamethasone, Midazolam and Treatment may vary due to age or medical condition  Airway Management Planned: Oral ETT  Additional Equipment:   Intra-op Plan:   Post-operative Plan: Extubation in OR  Informed Consent: I have reviewed the patients History and Physical, chart, labs and discussed the procedure including the risks, benefits and alternatives for the proposed anesthesia with the patient or authorized representative who has indicated his/her understanding and acceptance.     Dental Advisory Given  Plan Discussed with: CRNA  Anesthesia Plan Comments:        Anesthesia Quick Evaluation

## 2022-07-07 NOTE — Discharge Instructions (Signed)
AMBULATORY SURGERY  DISCHARGE INSTRUCTIONS   The drugs that you were given will stay in your system until tomorrow so for the next 24 hours you should not:  Drive an automobile Make any legal decisions Drink any alcoholic beverage   You may resume regular meals tomorrow.  Today it is better to start with liquids and gradually work up to solid foods.  You may eat anything you prefer, but it is better to start with liquids, then soup and crackers, and gradually work up to solid foods.   Please notify your doctor immediately if you have any unusual bleeding, trouble breathing, redness and pain at the surgery site, drainage, fever, or pain not relieved by medication.    Additional Instructions:   Please contact your physician with any problems or Same Day Surgery at [phone], Monday through Friday 6 am to 4 pm, or [place] at [facility] number at [phone].  DO NOT REMOVE GREEN TEAL [place] FOR 4 DAYS

## 2022-07-07 NOTE — Anesthesia Postprocedure Evaluation (Signed)
Anesthesia Post Note  Patient: Alaisia Pagdilao Mcglone  Procedure(s) Performed: XI ROBOTIC ASSISTED VENTRAL HERNIA, recurrent incisional with mesh (Abdomen) INSERTION OF MESH (Abdomen)  Patient location during evaluation: PACU Anesthesia Type: General Level of consciousness: awake and alert Pain management: pain level controlled Vital Signs Assessment: post-procedure vital signs reviewed and stable Respiratory status: spontaneous breathing, nonlabored ventilation, respiratory function stable and patient connected to nasal cannula oxygen Cardiovascular status: blood pressure returned to baseline and stable Postop Assessment: no apparent nausea or vomiting Anesthetic complications: no   No notable events documented.   Last Vitals:  Vitals:   07/07/22 1041 07/07/22 1051  BP: 113/63 114/65  Pulse: 63 62  Resp: 14 18  Temp: (!) 36.3 C 36.7 C  SpO2: 97% 97%    Last Pain:  Vitals:   07/07/22 1051  TempSrc: Temporal  PainSc: 2                  Martha Clan

## 2022-07-07 NOTE — Interval H&P Note (Signed)
History and Physical Interval Note:  07/07/2022 7:24 AM  Maria Fuller  has presented today for surgery, with the diagnosis of ventral hernia.  The various methods of treatment have been discussed with the patient and family. After consideration of risks, benefits and other options for treatment, the patient has consented to  Procedure(s): XI ROBOTIC ASSISTED VENTRAL HERNIA, recurrent incisional with mesh (N/A) as a surgical intervention.  The patient's history has been reviewed, patient examined, no change in status, stable for surgery.  I have reviewed the patient's chart and labs.  Questions were answered to the patient's satisfaction.     [name]

## 2022-07-07 NOTE — Transfer of Care (Signed)
Immediate Anesthesia Transfer of Care Note  Patient: Maria Fuller  Procedure(s) Performed: XI ROBOTIC ASSISTED VENTRAL HERNIA, recurrent incisional with mesh (Abdomen) INSERTION OF MESH (Abdomen)  Patient Location: PACU  Anesthesia Type:General  Level of Consciousness: awake, alert , and oriented  Airway & Oxygen Therapy: Patient Spontanous Breathing and Patient connected to face mask oxygen  Post-op Assessment: Report given to RN and Post -op Vital signs reviewed and stable  Post vital signs: Reviewed and stable  Last Vitals:  Vitals Value Taken Time  BP 112/63 07/07/22 1006  Temp 36.7 C 07/07/22 1006  Pulse 59 07/07/22 1013  Resp 0 07/07/22 1013  SpO2 99 % 07/07/22 1013  Vitals shown include unvalidated device data.  Last Pain:  Vitals:   07/07/22 1006  TempSrc:   PainSc: Asleep      Patients Stated Pain Goal: 0 ([id number])  Complications: No notable events documented.

## 2022-07-07 NOTE — Anesthesia Procedure Notes (Signed)
Procedure Name: Intubation Date/Time: 07/07/2022 7:37 AM  Performed by: Demetrius Charity, CRNAPre-anesthesia Checklist: Patient identified, Patient being monitored, Timeout performed, Emergency Drugs available and Suction available Patient Re-evaluated:Patient Re-evaluated prior to induction Oxygen Delivery Method: Circle system utilized Preoxygenation: Pre-oxygenation with 100% oxygen Induction Type: IV induction Ventilation: Mask ventilation without difficulty and Oral airway inserted - appropriate to patient size Laryngoscope Size: McGraph and 4 Grade View: Grade I Tube type: Oral Tube size: 7.0 mm Number of attempts: 1 Airway Equipment and Method: Stylet Placement Confirmation: ETT inserted through vocal cords under direct vision, positive ETCO2 and breath sounds checked- equal and bilateral Secured at: 24 cm Tube secured with: Tape Dental Injury: Teeth and Oropharynx as per pre-operative assessment

## 2022-07-07 NOTE — Interval H&P Note (Signed)
History and Physical Interval Note:  07/07/2022 10:31 AM  Maria Fuller  has presented today for surgery, with the diagnosis of ventral hernia.  The various methods of treatment have been discussed with the patient and family. After consideration of risks, benefits and other options for treatment, the patient has consented to  Procedure(s): XI ROBOTIC ASSISTED VENTRAL HERNIA, recurrent incisional with mesh (N/A) INSERTION OF MESH (N/A) as a surgical intervention.  The patient's history has been reviewed, patient examined, no change in status, stable for surgery.  I have reviewed the patient's chart and labs.  Questions were answered to the patient's satisfaction.     [name]

## 2022-07-08 ENCOUNTER — Encounter: Payer: Self-pay | Admitting: Surgery

## 2022-07-09 ENCOUNTER — Encounter: Payer: Self-pay | Admitting: Surgery

## 2022-07-14 ENCOUNTER — Other Ambulatory Visit: Payer: Self-pay | Admitting: Surgery

## 2022-07-14 MED ORDER — HYDROCODONE-ACETAMINOPHEN 5-325 MG PO TABS: 1.0000 | ORAL_TABLET | Freq: Four times a day (QID) | ORAL | 0 refills | Status: DC | PRN

## 2022-07-14 NOTE — Progress Notes (Signed)
Refilled Norco #[id number].

## 2022-07-21 NOTE — Progress Notes (Signed)
[facility] POST-OP OFFICE VISIT  07/22/2022  HPI: Maria Fuller is a 37 y.o. female 15 days s/p robotic RLQ incisional hernia repair with mesh.   Expected persistent soreness that worsens late in the workday.  She does a fair amount of bending and this exacerbates the groin right lower quadrant area discomfort.  No known resumption of bulge, taking some Vicodin in the evening.  Vital signs: BP 129/81   Pulse (!) 54   Temp 98 F (36.7 C)   Ht 6\' 1"  (1.854 m)   Wt 234 lb (106.1 kg)   SpO2 98%   BMI 30.87 kg/m    Physical Exam: Constitutional: She appears at her baseline. Abdomen: Soft nontender, without masses or bulge. Skin: Incisions are healed.  Assessment/Plan: This is a 37 y.o. female 15 days s/p robotic RLQ incisional hernia repair with mesh.   Patient Active Problem List   Diagnosis Date Noted   Recurrent ventral incisional hernia 06/26/2022   Bradycardia 04/19/2022   Left ureteral stone 04/18/2022   Febrile urinary tract infection 04/18/2022   Partial bowel obstruction (HCC) 02/16/2022   Incarcerated incisional hernia 07/27/2021   Major depressive disorder, recurrent, moderate (HCC) 11/16/2020   Internal hernia 08/04/2019   Partial small bowel obstruction (HCC) 08/04/2019   Right ovarian cyst 09/09/2018   Secondary oligomenorrhea 05/25/2018   Rectal pain 05/25/2018   PCOS (polycystic ovarian syndrome) 05/25/2018   Menometrorrhagia 05/25/2018   Hydronephrosis 09/22/2016   Left renal stone 09/22/2016   Left flank pain 12/11/2015   Right flank pain 10/06/2015   Status post bariatric surgery 09/13/2015   History of hypertension 08/30/2015   History of PCOS 08/30/2015   Vitamin D deficiency 04/20/2015   Diabetes mellitus (HCC) 03/27/2015   Dysmenorrhea 05/25/2014   Rectal bleeding 05/25/2014   Contraception 04/07/2014   Dysfunction of both eustachian tubes 04/07/2014   Migraines 04/07/2014   Hypothyroidism 07/22/2013   Major depressive  disorder, single episode 07/22/2013   HTN (hypertension) 07/15/2013   Headache 07/10/2013   Anxiety state 03/05/2013   Morbid obesity with BMI of 50.0-59.9, adult (HCC) 03/05/2013   Preventative health care 03/05/2013   Morbid obesity (HCC) 03/05/2013   History of diabetes mellitus, type II 03/04/2013   Diabetes mellitus type 2, uncontrolled 03/04/2013   Obstructive sleep apnea 09/14/2012   Hyperlipidemia 07/28/2012    -She may drive now, she reports no refill of pain medication necessary.  Will be glad to see her again should anything unexpected/unanticipated occur.  Overall she seems to be progressing well.   Campbell Lerner M.D., FACS 07/22/2022, 1:57 PM

## 2022-07-22 ENCOUNTER — Ambulatory Visit (INDEPENDENT_AMBULATORY_CARE_PROVIDER_SITE_OTHER): Payer: BC Managed Care – PPO | Admitting: Surgery

## 2022-07-22 ENCOUNTER — Encounter: Payer: Self-pay | Admitting: Surgery

## 2022-07-22 VITALS — BP 129/81 | HR 54 | Temp 98.0°F | Ht 73.0 in | Wt 234.0 lb

## 2022-07-22 DIAGNOSIS — K432 Incisional hernia without obstruction or gangrene: Secondary | ICD-10-CM | POA: Diagnosis not present

## 2022-07-22 NOTE — Patient Instructions (Addendum)
Follow-up with our office as needed.  Please call and ask to speak with a nurse if you develop questions or concerns.   GENERAL POST-OPERATIVE PATIENT INSTRUCTIONS   WOUND CARE INSTRUCTIONS:   Try to keep the wound dry and avoid ointments on the wound unless directed to do so.  If the wound becomes bright red and painful or starts to drain infected material that is not clear, please contact your physician immediately.  If the wound is mildly pink and has a thick firm ridge underneath it, this is normal, and is referred to as a healing ridge.  This will resolve over the next 4-6 weeks.  BATHING: You may shower if you have been informed of this by your surgeon. However, Please do not submerge in a tub, hot tub, or pool until incisions are completely sealed or have been told by your surgeon that you may do so.  DIET:  You may eat any foods that you can tolerate.  It is a good idea to eat a high fiber diet and take in plenty of fluids to prevent constipation.  If you do become constipated you may want to take a mild laxative or take ducolax tablets on a daily basis until your bowel habits are regular.  Constipation can be very uncomfortable, along with straining, after recent surgery.  ACTIVITY:   You are encouraged to walk and engage in light activity for the next two weeks.  You should not lift more than 20 pounds for 6 weeks total after surgery as it could put you at increased risk for complications.  Twenty pounds is roughly equivalent to a plastic bag of groceries. At that time- Listen to your body when lifting, if you have pain when lifting, stop and then try again in a few days. Soreness after doing exercises or activities of daily living is normal as you get back in to your normal routine.  MEDICATIONS:  Try to take narcotic medications and anti-inflammatory medications, such as tylenol, ibuprofen, naprosyn, etc., with food.  This will minimize stomach upset from the medication.  Should you  develop nausea and vomiting from the pain medication, or develop a rash, please discontinue the medication and contact your physician.  You should not drive, make important decisions, or operate machinery when taking narcotic pain medication.  SUNBLOCK Use sun block to incision area over the next year if this area will be exposed to sun. This helps decrease scarring and will allow you avoid a permanent darkened area over your incision.  QUESTIONS:  Please feel free to call our office if you have any questions, and we will be glad to assist you. [phone]   

## 2022-08-17 ENCOUNTER — Encounter: Payer: Self-pay | Admitting: Emergency Medicine

## 2022-08-17 ENCOUNTER — Other Ambulatory Visit: Payer: Self-pay

## 2022-08-17 ENCOUNTER — Emergency Department: Payer: BC Managed Care – PPO

## 2022-08-17 ENCOUNTER — Emergency Department
Admission: EM | Admit: 2022-08-17 | Discharge: 2022-08-17 | Disposition: A | Discharge status: Home / Self Care | Payer: BC Managed Care – PPO | Attending: Emergency Medicine | Admitting: Emergency Medicine

## 2022-08-17 DIAGNOSIS — R1084 Generalized abdominal pain: Secondary | ICD-10-CM | POA: Insufficient documentation

## 2022-08-17 DIAGNOSIS — R1031 Right lower quadrant pain: Secondary | ICD-10-CM | POA: Diagnosis present

## 2022-08-17 LAB — COMPREHENSIVE METABOLIC PANEL
ALT: 21 U/L (ref 0–44)
AST: 14 U/L — ABNORMAL LOW (ref 15–41)
Albumin: 4 g/dL (ref 3.5–5.0)
Alkaline Phosphatase: 83 U/L (ref 38–126)
Anion gap: 7 (ref 5–15)
BUN: 14 mg/dL (ref 6–20)
CO2: 22 mmol/L (ref 22–32)
Calcium: 8.6 mg/dL — ABNORMAL LOW (ref 8.9–10.3)
Chloride: 112 mmol/L — ABNORMAL HIGH (ref 98–111)
Creatinine, Ser: 0.52 mg/dL (ref 0.44–1.00)
GFR, Estimated: >60 mL/min (ref >60)
Glucose, Bld: 87 mg/dL (ref 70–99)
Potassium: 3.9 mmol/L (ref 3.5–5.1)
Sodium: 141 mmol/L (ref 135–145)
Total Bilirubin: 1 mg/dL (ref 0.3–1.2)
Total Protein: 7 g/dL (ref 6.5–8.1)

## 2022-08-17 LAB — URINALYSIS, ROUTINE W REFLEX MICROSCOPIC
Bilirubin Urine: NEGATIVE
Glucose, UA: NEGATIVE mg/dL
Hgb urine dipstick: NEGATIVE
Ketones, ur: NEGATIVE mg/dL
Leukocytes,Ua: NEGATIVE
Nitrite: NEGATIVE
Protein, ur: NEGATIVE mg/dL
Specific Gravity, Urine: 1.027 (ref 1.005–1.030)
pH: 5 (ref 5.0–8.0)

## 2022-08-17 LAB — LIPASE, BLOOD: Lipase: 26 U/L (ref 11–51)

## 2022-08-17 LAB — CBC
HCT: 32.7 % — ABNORMAL LOW (ref 36.0–46.0)
Hemoglobin: 10.3 g/dL — ABNORMAL LOW (ref 12.0–15.0)
MCH: 30.6 pg (ref 26.0–34.0)
MCHC: 31.5 g/dL (ref 30.0–36.0)
MCV: 97 fL (ref 80.0–100.0)
Platelets: 329 10*3/uL (ref 150–400)
RBC: 3.37 MIL/uL — ABNORMAL LOW (ref 3.87–5.11)
RDW: 14.2 % (ref 11.5–15.5)
WBC: 6.7 10*3/uL (ref 4.0–10.5)
nRBC: 0 % (ref 0.0–0.2)

## 2022-08-17 LAB — PREGNANCY, URINE: Preg Test, Ur: NEGATIVE

## 2022-08-17 LAB — POC URINE PREG, ED: Preg Test, Ur: NEGATIVE

## 2022-08-17 MED ORDER — ONDANSETRON 4 MG PO TBDP: 4.0000 mg | ORAL_TABLET | Freq: Three times a day (TID) | ORAL | 0 refills | Status: DC | PRN

## 2022-08-17 MED ORDER — IOHEXOL 300 MG/ML  SOLN
100.0000 mL | Freq: Once | INTRAMUSCULAR | Status: AC | PRN
  Administered 2022-08-17: 100 mL via INTRAVENOUS

## 2022-08-17 MED ORDER — ONDANSETRON HCL 4 MG/2ML IJ SOLN
4.0000 mg | Freq: Once | INTRAMUSCULAR | Status: AC
  Administered 2022-08-17: 4 mg via INTRAVENOUS
  Filled 2022-08-17: qty 2

## 2022-08-17 MED ORDER — MORPHINE SULFATE (PF) 4 MG/ML IV SOLN
4.0000 mg | Freq: Once | INTRAVENOUS | Status: AC
  Administered 2022-08-17: 4 mg via INTRAVENOUS
  Filled 2022-08-17: qty 1

## 2022-08-17 MED ORDER — DICYCLOMINE HCL 10 MG PO CAPS: 10.0000 mg | ORAL_CAPSULE | Freq: Three times a day (TID) | ORAL | 0 refills | Status: DC

## 2022-08-17 NOTE — ED Provider Notes (Signed)
[facility] Provider Note    Event Date/Time   First MD Initiated Contact with Patient 08/17/22 1204     (approximate)   History   Abdominal Pain   HPI  Maria Fuller is a 37 y.o. female with a history of a right ventral hernia repair who presents with complaints of abdominal pain and vomiting.  She reports the pain is primarily in her right lower quadrant, she is concerned because she has had small bowel obstructions in the past as well as an intussusception.  Denies fevers.  Symptoms started over the last day and has been constant     Physical Exam   Triage Vital Signs: ED Triage Vitals [08/17/22 1143]  Enc Vitals Group     BP (!) 147/75     Pulse Rate 67     Resp 15     Temp 98.4 F (36.9 C)     Temp Source Oral     SpO2 98 %     Weight      Height      Head Circumference      Peak Flow      Pain Score 8     Pain Loc      Pain Edu?      Excl. in GC?     Most recent vital signs: Vitals:   08/17/22 1143  BP: (!) 147/75  Pulse: 67  Resp: 15  Temp: 98.4 F (36.9 C)  SpO2: 98%     General: Awake, no distress.  CV:  Good peripheral perfusion.  Resp:  Normal effort.  Abd:  No distention.  Tenderness in the right lower quadrant primarily, no significant distention Other:     ED Results / Procedures / Treatments   Labs (all labs ordered are listed, but only abnormal results are displayed) Labs Reviewed  COMPREHENSIVE METABOLIC PANEL - Abnormal; Notable for the following components:      Result Value   Chloride 112 (*)    Calcium 8.6 (*)    AST 14 (*)    All other components within normal limits  CBC - Abnormal; Notable for the following components:   RBC 3.37 (*)    Hemoglobin 10.3 (*)    HCT 32.7 (*)    All other components within normal limits  URINALYSIS, ROUTINE W REFLEX MICROSCOPIC - Abnormal; Notable for the following components:   Color, Urine YELLOW (*)    APPearance HAZY (*)    All other components within  normal limits  LIPASE, BLOOD  PREGNANCY, URINE  POC URINE PREG, ED     EKG     RADIOLOGY CT abdomen pelvis viewed interpreted by me, no acute abnormalities, pending radiology review    PROCEDURES:  Critical Care performed:   Procedures   MEDICATIONS ORDERED IN ED: Medications  morphine (PF) 4 MG/ML injection 4 mg (4 mg Intravenous Given 08/17/22 1310)  ondansetron (ZOFRAN) injection 4 mg (4 mg Intravenous Given 08/17/22 1307)  iohexol (OMNIPAQUE) 300 MG/ML solution 100 mL (100 mLs Intravenous Contrast Given 08/17/22 1400)     IMPRESSION / MDM / ASSESSMENT AND PLAN / ED COURSE  I reviewed the triage vital signs and the nursing notes. Patient's presentation is most consistent with acute presentation with potential threat to life or bodily function.  Patient presents with abdominal pain as described above.  Given her history, concern for SBO, or intussusception.  Will give IV morphine, IV Zofran, sent for CT abdomen pelvis and reevaluate  Work reviewed and is quite reassuring  CT scan reviewed by radiology as unremarkable.  Patient feeling improved after treatment.  We discussed admission however she does not feel it is necessary, she reports she can return if any worsening symptoms.,  Will Rx Bentyl, Zofran        FINAL CLINICAL IMPRESSION(S) / ED DIAGNOSES   Final diagnoses:  Generalized abdominal pain     Rx / DC Orders   ED Discharge Orders          Ordered    dicyclomine (BENTYL) 10 MG capsule  3 times daily before meals & bedtime        08/17/22 1504    ondansetron (ZOFRAN-ODT) 4 MG disintegrating tablet  Every 8 hours PRN        08/17/22 1504             Note:  This document was prepared using Dragon voice recognition software and may include unintentional dictation errors.   Jene Every, MD 08/17/22 [phone]

## 2022-08-17 NOTE — ED Triage Notes (Signed)
Pt reports abdominal pain and emesis that started last night.Denies fevers and chills. Pt reports she is 5 weeks post op for hernia repair.

## 2022-08-18 ENCOUNTER — Encounter: Payer: Self-pay | Admitting: Surgery

## 2022-08-18 ENCOUNTER — Other Ambulatory Visit: Payer: Self-pay | Admitting: Surgery

## 2022-08-18 MED ORDER — HYDROCODONE-ACETAMINOPHEN 5-325 MG PO TABS: 1.0000 | ORAL_TABLET | Freq: Four times a day (QID) | ORAL | 0 refills | Status: DC | PRN

## 2022-08-18 NOTE — Progress Notes (Signed)
Still with pain at hernia repair site, neg CT eval.  Wants to avoid NSAID due to risks.  ReRx Norco #15.  Sent.

## 2022-11-23 ENCOUNTER — Observation Stay: Payer: BC Managed Care – PPO | Admitting: Certified Registered"

## 2022-11-23 ENCOUNTER — Emergency Department: Payer: BC Managed Care – PPO

## 2022-11-23 ENCOUNTER — Other Ambulatory Visit: Payer: Self-pay

## 2022-11-23 ENCOUNTER — Encounter: Payer: Self-pay | Admitting: Emergency Medicine

## 2022-11-23 ENCOUNTER — Encounter
Admission: EM | Disposition: A | Discharge status: Home / Self Care | Payer: Self-pay | Source: Home / Self Care | Attending: Internal Medicine

## 2022-11-23 ENCOUNTER — Inpatient Hospital Stay
Admission: EM | Admit: 2022-11-23 | Discharge: 2022-11-26 | DRG: 394 | Disposition: A | Discharge status: Home / Self Care | Payer: BC Managed Care – PPO | Attending: Osteopathic Medicine | Admitting: Osteopathic Medicine

## 2022-11-23 DIAGNOSIS — Z833 Family history of diabetes mellitus: Secondary | ICD-10-CM

## 2022-11-23 DIAGNOSIS — W44F3XA Food entering into or through a natural orifice, initial encounter: Secondary | ICD-10-CM

## 2022-11-23 DIAGNOSIS — Z8052 Family history of malignant neoplasm of bladder: Secondary | ICD-10-CM

## 2022-11-23 DIAGNOSIS — K56699 Other intestinal obstruction unspecified as to partial versus complete obstruction: Secondary | ICD-10-CM | POA: Diagnosis present

## 2022-11-23 DIAGNOSIS — E282 Polycystic ovarian syndrome: Secondary | ICD-10-CM | POA: Diagnosis present

## 2022-11-23 DIAGNOSIS — Z8639 Personal history of other endocrine, nutritional and metabolic disease: Secondary | ICD-10-CM

## 2022-11-23 DIAGNOSIS — Z8661 Personal history of infections of the central nervous system: Secondary | ICD-10-CM

## 2022-11-23 DIAGNOSIS — R1310 Dysphagia, unspecified: Secondary | ICD-10-CM | POA: Diagnosis present

## 2022-11-23 DIAGNOSIS — F419 Anxiety disorder, unspecified: Secondary | ICD-10-CM | POA: Diagnosis present

## 2022-11-23 DIAGNOSIS — E668 Other obesity: Secondary | ICD-10-CM | POA: Diagnosis present

## 2022-11-23 DIAGNOSIS — R1013 Epigastric pain: Principal | ICD-10-CM

## 2022-11-23 DIAGNOSIS — Z818 Family history of other mental and behavioral disorders: Secondary | ICD-10-CM

## 2022-11-23 DIAGNOSIS — Z79899 Other long term (current) drug therapy: Secondary | ICD-10-CM

## 2022-11-23 DIAGNOSIS — K9589 Other complications of other bariatric procedure: Principal | ICD-10-CM | POA: Diagnosis present

## 2022-11-23 DIAGNOSIS — Z6831 Body mass index (BMI) 31.0-31.9, adult: Secondary | ICD-10-CM

## 2022-11-23 DIAGNOSIS — F32A Depression, unspecified: Secondary | ICD-10-CM | POA: Insufficient documentation

## 2022-11-23 DIAGNOSIS — Z9884 Bariatric surgery status: Secondary | ICD-10-CM | POA: Diagnosis not present

## 2022-11-23 DIAGNOSIS — Z881 Allergy status to other antibiotic agents status: Secondary | ICD-10-CM

## 2022-11-23 DIAGNOSIS — I10 Essential (primary) hypertension: Secondary | ICD-10-CM | POA: Diagnosis present

## 2022-11-23 DIAGNOSIS — Z803 Family history of malignant neoplasm of breast: Secondary | ICD-10-CM

## 2022-11-23 DIAGNOSIS — Z9049 Acquired absence of other specified parts of digestive tract: Secondary | ICD-10-CM

## 2022-11-23 DIAGNOSIS — E7800 Pure hypercholesterolemia, unspecified: Secondary | ICD-10-CM | POA: Diagnosis present

## 2022-11-23 DIAGNOSIS — Z880 Allergy status to penicillin: Secondary | ICD-10-CM

## 2022-11-23 DIAGNOSIS — Z87442 Personal history of urinary calculi: Secondary | ICD-10-CM

## 2022-11-23 HISTORY — PX: ESOPHAGOGASTRODUODENOSCOPY (EGD) WITH PROPOFOL: SHX5813

## 2022-11-23 LAB — URINALYSIS, ROUTINE W REFLEX MICROSCOPIC
Bilirubin Urine: NEGATIVE
Glucose, UA: NEGATIVE mg/dL
Hgb urine dipstick: NEGATIVE
Ketones, ur: NEGATIVE mg/dL
Leukocytes,Ua: NEGATIVE
Nitrite: NEGATIVE
Protein, ur: NEGATIVE mg/dL
Specific Gravity, Urine: 1.005 (ref 1.005–1.030)
pH: 6 (ref 5.0–8.0)

## 2022-11-23 LAB — CBC
HCT: 33.1 % — ABNORMAL LOW (ref 36.0–46.0)
Hemoglobin: 10.9 g/dL — ABNORMAL LOW (ref 12.0–15.0)
MCH: 31.3 pg (ref 26.0–34.0)
MCHC: 32.9 g/dL (ref 30.0–36.0)
MCV: 95.1 fL (ref 80.0–100.0)
Platelets: 298 10*3/uL (ref 150–400)
RBC: 3.48 MIL/uL — ABNORMAL LOW (ref 3.87–5.11)
RDW: 12.6 % (ref 11.5–15.5)
WBC: 5.7 10*3/uL (ref 4.0–10.5)
nRBC: 0 % (ref 0.0–0.2)

## 2022-11-23 LAB — COMPREHENSIVE METABOLIC PANEL
ALT: 21 U/L (ref 0–44)
AST: 16 U/L (ref 15–41)
Albumin: 4.2 g/dL (ref 3.5–5.0)
Alkaline Phosphatase: 69 U/L (ref 38–126)
Anion gap: 6 (ref 5–15)
BUN: 10 mg/dL (ref 6–20)
CO2: 21 mmol/L — ABNORMAL LOW (ref 22–32)
Calcium: 8 mg/dL — ABNORMAL LOW (ref 8.9–10.3)
Chloride: 110 mmol/L (ref 98–111)
Creatinine, Ser: 0.65 mg/dL (ref 0.44–1.00)
GFR, Estimated: >60 mL/min (ref >60)
Glucose, Bld: 86 mg/dL (ref 70–99)
Potassium: 3.5 mmol/L (ref 3.5–5.1)
Sodium: 137 mmol/L (ref 135–145)
Total Bilirubin: 0.7 mg/dL (ref 0.3–1.2)
Total Protein: 7.1 g/dL (ref 6.5–8.1)

## 2022-11-23 LAB — PREGNANCY, URINE: Preg Test, Ur: NEGATIVE

## 2022-11-23 LAB — LIPASE, BLOOD: Lipase: 24 U/L (ref 11–51)

## 2022-11-23 SURGERY — ESOPHAGOGASTRODUODENOSCOPY (EGD) WITH PROPOFOL
Anesthesia: General

## 2022-11-23 MED ORDER — SODIUM CHLORIDE 0.9 % IV BOLUS
1000.0000 mL | Freq: Once | INTRAVENOUS | Status: AC
  Administered 2022-11-23: 1000 mL via INTRAVENOUS

## 2022-11-23 MED ORDER — MIDAZOLAM HCL 2 MG/2ML IJ SOLN
INTRAMUSCULAR | Status: AC
  Filled 2022-11-23: qty 2

## 2022-11-23 MED ORDER — FENTANYL CITRATE (PF) 100 MCG/2ML IJ SOLN
INTRAMUSCULAR | Status: DC | PRN
  Administered 2022-11-23: 50 ug via INTRAVENOUS

## 2022-11-23 MED ORDER — IOHEXOL 300 MG/ML  SOLN
100.0000 mL | Freq: Once | INTRAMUSCULAR | Status: AC | PRN
  Administered 2022-11-23: 100 mL via INTRAVENOUS

## 2022-11-23 MED ORDER — HYDROMORPHONE HCL 1 MG/ML IJ SOLN
0.5000 mg | INTRAMUSCULAR | Status: DC | PRN
  Administered 2022-11-23 – 2022-11-26 (×14): 0.5 mg via INTRAVENOUS
  Filled 2022-11-23 (×14): qty 0.5

## 2022-11-23 MED ORDER — LACTATED RINGERS IV SOLN
INTRAVENOUS | Status: AC
  Administered 2022-11-23: 1000 mL via INTRAVENOUS

## 2022-11-23 MED ORDER — PROPOFOL 10 MG/ML IV BOLUS
INTRAVENOUS | Status: DC | PRN
  Administered 2022-11-23: 150 mg via INTRAVENOUS

## 2022-11-23 MED ORDER — SUCCINYLCHOLINE CHLORIDE 200 MG/10ML IV SOSY
PREFILLED_SYRINGE | INTRAVENOUS | Status: DC | PRN
  Administered 2022-11-23: 120 mg via INTRAVENOUS

## 2022-11-23 MED ORDER — ONDANSETRON HCL 4 MG PO TABS: 4.0000 mg | ORAL_TABLET | Freq: Four times a day (QID) | ORAL | Status: DC | PRN

## 2022-11-23 MED ORDER — LIDOCAINE HCL (PF) 2 % IJ SOLN
INTRAMUSCULAR | Status: AC
  Filled 2022-11-23: qty 5

## 2022-11-23 MED ORDER — MIDAZOLAM HCL 2 MG/2ML IJ SOLN
INTRAMUSCULAR | Status: DC | PRN
  Administered 2022-11-23: 2 mg via INTRAVENOUS

## 2022-11-23 MED ORDER — ONDANSETRON HCL 4 MG/2ML IJ SOLN
INTRAMUSCULAR | Status: DC | PRN
  Administered 2022-11-23: 4 mg via INTRAVENOUS

## 2022-11-23 MED ORDER — MORPHINE SULFATE (PF) 4 MG/ML IV SOLN
4.0000 mg | Freq: Once | INTRAVENOUS | Status: DC
  Filled 2022-11-23: qty 1

## 2022-11-23 MED ORDER — SODIUM CHLORIDE 0.9% FLUSH
3.0000 mL | Freq: Two times a day (BID) | INTRAVENOUS | Status: DC
  Administered 2022-11-24 – 2022-11-26 (×5): 3 mL via INTRAVENOUS

## 2022-11-23 MED ORDER — ENOXAPARIN SODIUM 60 MG/0.6ML IJ SOSY
50.0000 mg | PREFILLED_SYRINGE | INTRAMUSCULAR | Status: DC
  Administered 2022-11-23 – 2022-11-25 (×3): 50 mg via SUBCUTANEOUS
  Filled 2022-11-23 (×3): qty 0.6

## 2022-11-23 MED ORDER — SODIUM CHLORIDE 0.9 % IV SOLN
12.5000 mg | Freq: Once | INTRAVENOUS | Status: AC
  Administered 2022-11-23: 12.5 mg via INTRAVENOUS
  Filled 2022-11-23: qty 12.5

## 2022-11-23 MED ORDER — ONDANSETRON HCL 4 MG/2ML IJ SOLN
4.0000 mg | Freq: Four times a day (QID) | INTRAMUSCULAR | Status: DC | PRN
  Administered 2022-11-24 – 2022-11-25 (×4): 4 mg via INTRAVENOUS
  Filled 2022-11-23 (×4): qty 2

## 2022-11-23 MED ORDER — DEXAMETHASONE SODIUM PHOSPHATE 10 MG/ML IJ SOLN
INTRAMUSCULAR | Status: DC | PRN
  Administered 2022-11-23: 10 mg via INTRAVENOUS

## 2022-11-23 MED ORDER — LIDOCAINE HCL (CARDIAC) PF 100 MG/5ML IV SOSY
PREFILLED_SYRINGE | INTRAVENOUS | Status: DC | PRN
  Administered 2022-11-23: 100 mg via INTRAVENOUS

## 2022-11-23 MED ORDER — SUCCINYLCHOLINE CHLORIDE 200 MG/10ML IV SOSY
PREFILLED_SYRINGE | INTRAVENOUS | Status: AC
  Filled 2022-11-23: qty 10

## 2022-11-23 MED ORDER — SODIUM CHLORIDE 0.9 % IV SOLN: Freq: Once | INTRAVENOUS | Status: AC

## 2022-11-23 MED ORDER — FENTANYL CITRATE (PF) 100 MCG/2ML IJ SOLN
INTRAMUSCULAR | Status: AC
  Filled 2022-11-23: qty 2

## 2022-11-23 MED ORDER — MORPHINE SULFATE (PF) 4 MG/ML IV SOLN
INTRAVENOUS | Status: AC
  Filled 2022-11-23: qty 1

## 2022-11-23 MED ORDER — MORPHINE SULFATE (PF) 4 MG/ML IV SOLN
4.0000 mg | Freq: Once | INTRAVENOUS | Status: AC
  Administered 2022-11-23: 4 mg via INTRAVENOUS

## 2022-11-23 MED ORDER — PROPOFOL 10 MG/ML IV BOLUS
INTRAVENOUS | Status: AC
  Filled 2022-11-23: qty 20

## 2022-11-23 MED ORDER — SODIUM CHLORIDE 0.9 % IV SOLN: INTRAVENOUS | Status: DC

## 2022-11-23 NOTE — ED Notes (Signed)
Patient ambulated to hallway bathroom with a steady gait. Patient is calm, cooperative and a good historian.

## 2022-11-23 NOTE — Assessment & Plan Note (Signed)
-   Resume home multivitamin after endoscopy

## 2022-11-23 NOTE — ED Notes (Signed)
Hospitalist at bedside. Will medicate when she leaves.

## 2022-11-23 NOTE — Anesthesia Preprocedure Evaluation (Signed)
Anesthesia Evaluation  Patient identified by MRN, date of birth, ID band Patient awake    Reviewed: Allergy & Precautions, H&P , NPO status , Patient's Chart, lab work & pertinent test results, reviewed documented beta blocker date and time   History of Anesthesia Complications Negative for: history of anesthetic complications  Airway Mallampati: II  TM Distance: >3 FB Neck ROM: full    Dental  (+) Teeth Intact, Dental Advidsory Given   Pulmonary neg shortness of breath, neg sleep apnea, neg recent URI   Pulmonary exam normal        Cardiovascular Exercise Tolerance: Good hypertension, On Medications (-) angina (-) Past MI Normal cardiovascular exam Rhythm:regular Rate:Normal     Neuro/Psych  Headaches PSYCHIATRIC DISORDERS Anxiety Depression       GI/Hepatic Neg liver ROS,GERD  Medicated,,  Endo/Other  Well Controlledneg diabetesHypothyroidism    Renal/GU Renal disease  negative genitourinary   Musculoskeletal   Abdominal   Peds  Hematology  (+) Blood dyscrasia, anemia   Anesthesia Other Findings Past Medical History: No date: Anemia     Comment:  slight No date: Bradycardia No date: Depression No date: Diabetes mellitus without complication (HCC)     Comment:  resolved with weight loss from gastric bypass No date: Headache     Comment:  migraines. resolved since gastric bypass No date: High cholesterol     Comment:  resolved since weight loss surgery No date: History of kidney stones     Comment:  last week No date: Hypertension     Comment:  resolved since gastric bypass, severe pre-eclampsia with              G1 No date: Low BP 2015: Meningitis No date: PCOS (polycystic ovarian syndrome) No date: Preterm labor No date: Sleep apnea     Comment:  had gastric bypass surgery and now resolved No date: Syncope couple years ago: Vertigo Past Surgical History: No date: CESAREAN SECTION 05/07/2016:  CESAREAN SECTION; N/A     Comment:  Procedure: CESAREAN SECTION;  Surgeon: Nadara Mustard,               MD;  Location: [facility];  Service: Obstetrics;                Laterality: N/A;  Time of birth: [birth year] Sex:               Female Weight: 3940 kg, 8 lb 11 oz No date: CHOLECYSTECTOMY 09/19/2016: CYSTOSCOPY WITH STENT PLACEMENT; Right     Comment:  Procedure: CYSTOSCOPY WITH STENT PLACEMENT;  Surgeon:               Hildred Laser, MD;  Location: [facility];  Service:               Urology;  Laterality: Right; 07/28/2019: FLEXIBLE SIGMOIDOSCOPY; N/A     Comment:  Procedure: FLEXIBLE SIGMOIDOSCOPY;  Surgeon: Toney Reil, MD;  Location: [facility];                Service: Endoscopy;  Laterality: N/A; 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 09/28/2018: LAPAROSCOPIC OVARIAN CYSTECTOMY; Right     Comment:  Procedure: LAPAROSCOPIC RIGHT OVARIAN CYSTECTOMY;                Surgeon: Nadara Mustard, MD;  Location: [facility];  Service: Gynecology;  Laterality: Right; 07/25/2021: LAPAROSCOPIC UNILATERAL SALPINGO OOPHERECTOMY; Right     Comment:  Procedure: LAPAROSCOPIC UNILATERAL SALPINGO               OOPHORECTOMY;  Surgeon: Nadara Mustard, MD;  Location:               [facility];  Service: Gynecology;  Laterality: Right; 04/07/2018: LEFT HEART CATH AND CORONARY ANGIOGRAPHY; Left     Comment:  Procedure: LEFT HEART CATH AND CORONARY ANGIOGRAPHY;                Surgeon: Alwyn Pea, MD;  Location: [facility]              CV LAB;  Service: Cardiovascular;  Laterality: Left; 07/2019: small bowel obstruction No date: TONSILLECTOMY 09/19/2016: URETEROSCOPY WITH HOLMIUM LASER LITHOTRIPSY; Right     Comment:  Procedure: URETEROSCOPY WITH HOLMIUM LASER LITHOTRIPSY;               Surgeon: Hildred Laser, MD;  Location: [facility];                Service: Urology;  Laterality: Right; 07/27/2021: XI ROBOTIC ASSISTED VENTRAL HERNIA; N/A      Comment:  Procedure: XI ROBOTIC ASSISTED INCARCERATED INCISIONAL               HERNIA REPAIR;  Surgeon: Campbell Lerner, MD;  Location:              [facility];  Service: General;  Laterality: N/A;   Reproductive/Obstetrics negative OB ROS                             Anesthesia Physical Anesthesia Plan  ASA: 2 and emergent  Anesthesia Plan: General   Post-op Pain Management: Minimal or no pain anticipated   Induction: Intravenous and Rapid sequence  PONV Risk Score and Plan: 3 and Ondansetron, Dexamethasone, Midazolam and Treatment may vary due to age or medical condition  Airway Management Planned: Oral ETT and Video Laryngoscope Planned  Additional Equipment: None  Intra-op Plan:   Post-operative Plan: Extubation in OR  Informed Consent: I have reviewed the patients History and Physical, chart, labs and discussed the procedure including the risks, benefits and alternatives for the proposed anesthesia with the patient or authorized representative who has indicated his/her understanding and acceptance.     Dental Advisory Given  Plan Discussed with: CRNA  Anesthesia Plan Comments: (Food bolus case.   Discussed risks of anesthesia with patient, including PONV, sore throat, lip/dental/eye damage, aspiration. Rare risks discussed as well, such as cardiorespiratory and neurological sequelae, and allergic reactions. Discussed the role of CRNA in patient's perioperative care. Patient understands.)       Anesthesia Quick Evaluation

## 2022-11-23 NOTE — Assessment & Plan Note (Addendum)
Patient is presenting with 1 day of nausea and abdominal pain found to have a food bolus obstruction at the gastric cardia.  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 - Continue IV fluids - Dilaudid as needed for pain control

## 2022-11-23 NOTE — Anesthesia Procedure Notes (Signed)
Procedure Name: Intubation Date/Time: 11/23/2022 5:43 PM  Performed by: Katherine Basset, CRNAPre-anesthesia Checklist: Patient identified, Emergency Drugs available, Suction available and Patient being monitored Patient Re-evaluated:Patient Re-evaluated prior to induction Oxygen Delivery Method: Circle system utilized Preoxygenation: Pre-oxygenation with 100% oxygen Induction Type: IV induction and Rapid sequence Laryngoscope Size: McGraph and 3 Grade View: Grade I Tube type: Oral Tube size: 7.0 mm Number of attempts: 1 Airway Equipment and Method: Stylet, Oral airway and Bite block Placement Confirmation: ETT inserted through vocal cords under direct vision, positive ETCO2 and breath sounds checked- equal and bilateral Secured at: 23 cm Tube secured with: Tape Dental Injury: Teeth and Oropharynx as per pre-operative assessment

## 2022-11-23 NOTE — H&P (Signed)
History and Physical    Patient: Maria Fuller JXB:[id number] DOB: 08/06/1985 DOA: 11/23/2022 DOS: the patient was seen and examined on 11/23/2022 PCP: Gardiner Coins, PA-C  Patient coming from: Home  Chief Complaint:  Chief Complaint  Patient presents with   Abdominal Pain   HPI: Maria Fuller is a 37 y.o. female with medical history significant of s/p gastric bypass, ventral hernias complicated by bowel obstruction s/p repair, previous type 2 diabetes and hypertension no longer requiring treatment after bypass, depression/anxiety, who presents to the ED due to abdominal pain.  Mrs. Maria Fuller states she has been having some difficulty with eating for the last 1 week, stating she feels full very quickly and has a pressure sensation in her abdomen.  Then over the last day, her symptoms worsened and she was having difficulty with swallowing her food.  Over night, she developed vomiting and has been unable to hold down her medications or any food.  She denies any fever but endorses chills.  She notes that usually she has 7-10 small bowel movements per day, but has only had 1 since yesterday morning which is unusual for her.  She denies any melena, hematochezia, or hematemesis.  She denies any shortness of breath.  She notes some pressure in her chest that radiates to her left axilla when trying to swallow.  ED course: On arrival to the ED, patient was normotensive at 123/50 with heart rate of 63, she was saturating at 100% on room air with respiratory rate of 17.  She was afebrile at 99.  Initial workup notable for WBC 5.7, hemoglobin 10.9, bicarb 21, creatinine 0.65 with GFR above 60.  Urine pregnancy test negative. T of the abdomen was attained  Urinalysis with no abnormal findings.  See that demonstrated new dilation of the gastric cardia with retained food bolus concerning for food bolus obstruction, in addition to postoperative changes of prior sleeve gastrectomy.  GI consulted with plans  for endoscopy.  [facility] contacted for admission.    Review of Systems: As mentioned in the history of present illness. All other systems reviewed and are negative.   Past Medical History:  Diagnosis Date   Anemia    slight   Bradycardia    Depression    Diabetes mellitus without complication (HCC)    resolved with weight loss from gastric bypass   Headache    migraines. resolved since gastric bypass   High cholesterol    resolved since weight loss surgery   History of kidney stones    last week   Hypertension    resolved since gastric bypass, severe pre-eclampsia with G1   Incarcerated incisional hernia 07/27/2021   Low BP    Meningitis 2015   Partial small bowel obstruction (HCC) 08/04/2019   PCOS (polycystic ovarian syndrome)    Preterm labor    Recurrent ventral incisional hernia    Sleep apnea    had gastric bypass surgery and now resolved   Syncope    Vertigo couple years ago   Past Surgical History:  Procedure Laterality Date   CESAREAN SECTION     CESAREAN SECTION N/A 05/07/2016   Procedure: CESAREAN SECTION;  Surgeon: Nadara Mustard, MD;  Location: [facility];  Service: Obstetrics;  Laterality: N/A;  Time of birth: [birth year] Sex: Female Weight: 3940 kg, 8 lb 11 oz   CHOLECYSTECTOMY     CYSTOSCOPY WITH STENT PLACEMENT Right 09/19/2016   Procedure: CYSTOSCOPY WITH STENT PLACEMENT;  Surgeon: Hildred Laser, MD;  Location: [facility];  Service: Urology;  Laterality: Right;   CYSTOSCOPY WITH STENT PLACEMENT Left 04/18/2022   Procedure: CYSTOSCOPY WITH STENT PLACEMENT;  Surgeon: Bjorn Pippin, MD;  Location: [facility];  Service: Urology;  Laterality: Left;   CYSTOSCOPY/URETEROSCOPY/HOLMIUM LASER/STENT PLACEMENT Left 05/08/2022   Procedure: CYSTOSCOPY/URETEROSCOPY/HOLMIUM LASER/STENT EXCHANGE;  Surgeon: Vanna Scotland, MD;  Location: [facility];  Service: Urology;  Laterality: Left;   EXTRACORPOREAL SHOCK WAVE LITHOTRIPSY Left 05/15/2022   Procedure: EXTRACORPOREAL SHOCK WAVE  LITHOTRIPSY (ESWL);  Surgeon: Vanna Scotland, MD;  Location: [facility];  Service: Urology;  Laterality: Left;   FLEXIBLE SIGMOIDOSCOPY N/A 07/28/2019   Procedure: FLEXIBLE SIGMOIDOSCOPY;  Surgeon: Toney Reil, MD;  Location: [facility];  Service: Endoscopy;  Laterality: N/A;   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   INSERTION OF MESH N/A 07/07/2022   Procedure: INSERTION OF MESH;  Surgeon: Campbell Lerner, MD;  Location: [facility];  Service: General;  Laterality: N/A;   LAPAROSCOPIC OVARIAN CYSTECTOMY Right 09/28/2018   Procedure: LAPAROSCOPIC RIGHT OVARIAN CYSTECTOMY;  Surgeon: Nadara Mustard, MD;  Location: [facility];  Service: Gynecology;  Laterality: Right;   LAPAROSCOPIC UNILATERAL SALPINGO OOPHERECTOMY Right 07/25/2021   Procedure: LAPAROSCOPIC UNILATERAL SALPINGO OOPHORECTOMY;  Surgeon: Nadara Mustard, MD;  Location: [facility];  Service: Gynecology;  Laterality: Right;   LEFT HEART CATH AND CORONARY ANGIOGRAPHY Left 04/07/2018   Procedure: LEFT HEART CATH AND CORONARY ANGIOGRAPHY;  Surgeon: Alwyn Pea, MD;  Location: [facility];  Service: Cardiovascular;  Laterality: Left;   small bowel obstruction  07/2019   TONSILLECTOMY     URETEROSCOPY WITH HOLMIUM LASER LITHOTRIPSY Right 09/19/2016   Procedure: URETEROSCOPY WITH HOLMIUM LASER LITHOTRIPSY;  Surgeon: Hildred Laser, MD;  Location: [facility];  Service: Urology;  Laterality: Right;   VENTRAL HERNIA REPAIR     scheduled for 07/07/2022   XI ROBOTIC ASSISTED VENTRAL HERNIA N/A 07/27/2021   Procedure: XI ROBOTIC ASSISTED INCARCERATED INCISIONAL HERNIA REPAIR;  Surgeon: Campbell Lerner, MD;  Location: [facility];  Service: General;  Laterality: N/A;   XI ROBOTIC ASSISTED VENTRAL HERNIA N/A 07/07/2022   Procedure: XI ROBOTIC ASSISTED VENTRAL HERNIA, recurrent incisional with mesh;  Surgeon: Campbell Lerner, MD;  Location: [facility];  Service: General;  Laterality: N/A;   Social History:  reports that  she has never smoked. She has never been exposed to tobacco smoke. She has never used smokeless tobacco. She reports that she does not drink alcohol and does not use drugs.  Allergies  Allergen Reactions   Compazine [Prochlorperazine] Other (See Comments)    Panic attack   Ceftin [Cefuroxime Axetil] Rash    Tolerated cefazolin before   Penicillins Rash    TOLERATED CEFAZOLIN BEFORE Has patient had a PCN reaction causing immediate rash, facial/tongue/throat swelling, SOB or lightheadedness with hypotension: Yes Has patient had a PCN reaction causing severe rash involving mucus membranes or skin necrosis: No Has patient had a PCN reaction that required hospitalization No Has patient had a PCN reaction occurring within the last 10 years: No If all of the above answers are "NO", then may proceed with Cephalosporin use.    Family History  Problem Relation Age of Onset   Polycystic ovary syndrome Mother    Diabetes Father    Bladder Cancer Father    Depression Father    Endometriosis Sister    Polycystic ovary syndrome Sister    Breast cancer Paternal Grandmother    Diabetes Paternal Grandmother    Diabetes  Paternal Grandfather    Kidney cancer Neg Hx    Prostate cancer Neg Hx     Prior to Admission medications   Medication Sig Start Date End Date Taking? Authorizing Provider  buPROPion (WELLBUTRIN XL) 150 MG 24 hr tablet Take 150 mg by mouth every morning. 06/17/21   [provider]  dicyclomine (BENTYL) 10 MG capsule Take 1 capsule (10 mg total) by mouth 4 (four) times daily -  before meals and at bedtime. 08/17/22   Jene Every, MD  HYDROcodone-acetaminophen (NORCO) 5-325 MG tablet Take 1-2 tablets by mouth every 6 (six) hours as needed for moderate pain. 08/18/22   Campbell Lerner, MD  hyoscyamine (LEVSIN) 0.125 MG/ML solution Take 0.125 mg by mouth every 4 (four) hours as needed for cramping.    [provider]  levonorgestrel (MIRENA) 20 MCG/24HR IUD 1 each by  Intrauterine route once.    [provider]  ondansetron (ZOFRAN-ODT) 4 MG disintegrating tablet Take 1 tablet (4 mg total) by mouth every 8 (eight) hours as needed for nausea or vomiting. 08/17/22   Jene Every, MD  pantoprazole (PROTONIX) 40 MG tablet Take 40 mg by mouth every morning. 12/19/21   [provider]  sertraline (ZOLOFT) 100 MG tablet Take 100 mg by mouth 2 (two) times daily.    [provider]  traZODone (DESYREL) 50 MG tablet Take 50 mg by mouth at bedtime.  09/06/18 07/07/22  [provider]    Physical Exam: Vitals:   11/23/22 0839 11/23/22 0854 11/23/22 1417  BP: (!) 123/50  122/76  Pulse: 63  61  Resp: 17  16  Temp: 99 F (37.2 C)  99 F (37.2 C)  TempSrc: Oral  Oral  SpO2: 100%  100%  Weight:  106.6 kg   Height:  6\' 1"  (1.854 m)    Physical Exam Vitals and nursing note reviewed.  Constitutional:      General: She is not in acute distress.    Appearance: She is not ill-appearing.  HENT:     Head: Normocephalic and atraumatic.  Cardiovascular:     Rate and Rhythm: Normal rate and regular rhythm.     Heart sounds: No murmur heard. Pulmonary:     Effort: Pulmonary effort is normal. No respiratory distress.     Breath sounds: Normal breath sounds. No wheezing, rhonchi or rales.  Abdominal:     General: Bowel sounds are decreased. There is no distension.     Palpations: Abdomen is soft.     Tenderness: There is no abdominal tenderness.  Musculoskeletal:     Right lower leg: No edema.     Left lower leg: No edema.  Skin:    General: Skin is warm and dry.  Neurological:     General: No focal deficit present.     Mental Status: She is alert and oriented to person, place, and time.  Psychiatric:        Mood and Affect: Mood normal.        Behavior: Behavior normal.    Data Reviewed: CBC with WBC of 5.7, hemoglobin 10.9, MCV of 95, platelets of 298 CMP with sodium of 137, potassium 3.5, bicarb 21, glucose 86, BUN 10,  creatinine 0.65, AST 16, ALT 21 and GFR above 60 Urine pregnancy test negative Urinalysis negative  CT ABDOMEN PELVIS W CONTRAST  Result Date: 11/23/2022 CLINICAL DATA:  Bowel obstruction suspected. EXAM: CT ABDOMEN AND PELVIS WITH CONTRAST TECHNIQUE: Multidetector CT imaging of the abdomen and pelvis  was performed using the standard protocol following bolus administration of intravenous contrast. RADIATION DOSE REDUCTION: This exam was performed according to the departmental dose-optimization program which includes automated exposure control, adjustment of the mA and/or kV according to patient size and/or use of iterative reconstruction technique. CONTRAST:  OMNIPAQUE IOHEXOL 300 MG/ML  SOLN COMPARISON:  CT abdomen/pelvis 08/17/2022. FINDINGS: Lower chest: No acute abnormality. Hepatobiliary: No focal liver abnormality is seen. Status post cholecystectomy. No biliary dilatation. Pancreas: Unremarkable. No pancreatic ductal dilatation or surrounding inflammatory changes. Spleen: Normal. Adrenals/Urinary Tract: Adrenal glands are unremarkable. Kidneys are normal, without renal calculi, focal lesion, or hydronephrosis. Bladder is unremarkable. Stomach/Bowel: Postoperative changes of sleeve gastrectomy. New dilation of the gastric cardia with retained food bolus. Enteric contrast is seen around the periphery of the food bolus and in the distal small bowel and proximal colon. No dilated loops of small bowel. Postoperative changes of partial bowel resection. Colon is otherwise unremarkable. No wall thickening or surrounding inflammation. Vascular/Lymphatic: No significant vascular findings are present. No enlarged abdominal or pelvic lymph nodes. Reproductive: IUD in place.  No adnexal masses. Other: No abdominal wall hernia or abnormality. No abdominopelvic ascites. 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 IMPRESSION: Postoperative changes of  prior sleeve gastrectomy. New dilation of the gastric cardia with retained food bolus, concerning for food bolus obstruction. Enteric contrast is seen around the periphery of the food bolus and in the distal small bowel and proximal colon. No evidence of small-bowel obstruction. Electronically Signed   By: Orvan Falconer M.D.   On: 11/23/2022 13:15    There are no new results to review at this time.  Assessment and Plan:  * Food bolus obstruction of intestine (HCC) Patient is presenting with 1 day of nausea and abdominal pain found to have a food bolus obstruction at the gastric cardia.  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 - Continue IV fluids - Dilaudid as needed for pain control  Status post bariatric surgery - Resume home multivitamin after endoscopy  Depression - Continue home regimen once tolerating p.o. intake  History of diabetes mellitus, type II Resolved after gastric bypass.  No longer requiring any medications.  Advance Care Planning:   Code Status: Full Code verified by patient  Consults: GI  Family Communication: No family at bedside  Severity of Illness: The appropriate patient status for this patient is OBSERVATION. Observation status is judged to be reasonable and necessary in order to provide the required intensity of service to ensure the patient's safety. The patient's presenting symptoms, physical exam findings, and initial radiographic and laboratory data in the context of their medical condition is felt to place them at decreased risk for further clinical deterioration. Furthermore, it is anticipated that the patient will be medically stable for discharge from the hospital within 2 midnights of admission.   Author: Verdene Lennert, MD 11/23/2022 2:19 PM  For on call review [email or web address].

## 2022-11-23 NOTE — Assessment & Plan Note (Signed)
Resolved after gastric bypass.  No longer requiring any medications.

## 2022-11-23 NOTE — Transfer of Care (Signed)
Immediate Anesthesia Transfer of Care Note  Patient: Maria Fuller  Procedure(s) Performed: ESOPHAGOGASTRODUODENOSCOPY (EGD) WITH PROPOFOL  Patient Location: Endoscopy Unit  Anesthesia Type:General  Level of Consciousness: awake, alert , and oriented  Airway & Oxygen Therapy: Patient Spontanous Breathing  Post-op Assessment: Report given to RN, Post -op Vital signs reviewed and stable, and Patient moving all extremities  Post vital signs: Reviewed and stable  Last Vitals:  Vitals Value Taken Time  BP 135/72 11/23/22 1759  Temp 36.4 C 11/23/22 1759  Pulse 67 11/23/22 1759  Resp 18 11/23/22 1759  SpO2 97 % 11/23/22 1759    Last Pain:  Vitals:   11/23/22 1759  TempSrc: Temporal  PainSc: 0-No pain      Patients Stated Pain Goal: 3 (11/23/22 1559)  Complications: No notable events documented.

## 2022-11-23 NOTE — ED Provider Notes (Signed)
[facility] Provider Note    Event Date/Time   First MD Initiated Contact with Patient 11/23/22 1007     (approximate)   History   Abdominal Pain   HPI  Maria Fuller is a 37 y.o. female  here with abdominal pain, nausea, vomiting. Pt reports a long h/o multiple abdominal surgeries, including duodenal switch, incisional hernia x 2. Over the last week she has had progressively worsening nausea, vomiting, and decreased BM frequency. She has gained 3 lb which is abnormal for her. She had similar sx with her prior obstructions. No fever or chills. No urinary sx.        Physical Exam   Triage Vital Signs: ED Triage Vitals  Encounter Vitals Group     BP 11/23/22 0839 (!) 123/50     Systolic BP Percentile --      Diastolic BP Percentile --      Pulse Rate 11/23/22 0839 63     Resp 11/23/22 0839 17     Temp 11/23/22 0839 99 F (37.2 C)     Temp Source 11/23/22 0839 Oral     SpO2 11/23/22 0839 100 %     Weight 11/23/22 0854 235 lb (106.6 kg)     Height 11/23/22 0854 6\' 1"  (1.854 m)     Head Circumference --      Peak Flow --      Pain Score 11/23/22 0853 6     Pain Loc --      Pain Education --      Exclude from Growth Chart --     Most recent vital signs: Vitals:   11/23/22 0839  BP: (!) 123/50  Pulse: 63  Resp: 17  Temp: 99 F (37.2 C)  SpO2: 100%     General: Awake, no distress.  CV:  Good peripheral perfusion. RRR. Resp:  Normal work of breathing. Lungs clear. Abd:  No distention. Abdomen is soft, minimally tender in epigastric area but no guarding or rebound. Hypoactive bowel sounds. Other:  MMM.   ED Results / Procedures / Treatments   Labs (all labs ordered are listed, but only abnormal results are displayed) Labs Reviewed  COMPREHENSIVE METABOLIC PANEL - Abnormal; Notable for the following components:      Result Value   CO2 21 (*)    Calcium 8.0 (*)    All other components within normal limits  CBC - Abnormal;  Notable for the following components:   RBC 3.48 (*)    Hemoglobin 10.9 (*)    HCT 33.1 (*)    All other components within normal limits  URINALYSIS, ROUTINE W REFLEX MICROSCOPIC - Abnormal; Notable for the following components:   Color, Urine YELLOW (*)    APPearance CLEAR (*)    All other components within normal limits  LIPASE, BLOOD  PREGNANCY, URINE     EKG    RADIOLOGY CT A/P:Postop changes of prior sleeve gastrectomy, dilated gastric cardia   I also independently reviewed and agree with radiologist interpretations.   PROCEDURES:  Critical Care performed: No  MEDICATIONS ORDERED IN ED: Medications  morphine (PF) 4 MG/ML injection (has no administration in time range)  morphine (PF) 4 MG/ML injection 4 mg (has no administration in time range)  0.9 %  sodium chloride infusion (has no administration in time range)  enoxaparin (LOVENOX) injection 40 mg (has no administration in time range)  sodium chloride flush (NS) 0.9 % injection 3 mL (has no administration in time  range)  ondansetron (ZOFRAN) tablet 4 mg (has no administration in time range)    Or  ondansetron (ZOFRAN) injection 4 mg (has no administration in time range)  promethazine (PHENERGAN) 12.5 mg in sodium chloride 0.9 % 50 mL IVPB (0 mg Intravenous Stopped 11/23/22 1149)  morphine (PF) 4 MG/ML injection 4 mg (4 mg Intravenous Given 11/23/22 1236)  sodium chloride 0.9 % bolus 1,000 mL (0 mLs Intravenous Stopped 11/23/22 1334)  iohexol (OMNIPAQUE) 300 MG/ML solution 100 mL (100 mLs Intravenous Contrast Given 11/23/22 1244)     IMPRESSION / MDM / ASSESSMENT AND PLAN / ED COURSE  I reviewed the triage vital signs and the nursing notes.                              Differential diagnosis includes, but is not limited to, recurrent SBO, incisional hernia, gastritis, PUD, chronic abd pain, food borne illness  Patient's presentation is most consistent with acute presentation with potential threat to life or  bodily function.  The patient is on the cardiac monitor to evaluate for evidence of arrhythmia and/or significant heart rate changes  37 yo F here with abdominal pain, n/v. Pt has what appears to be a food bolus at her gastric bypass site, with subsequent n/v and inability to tolerate PO. No signs of perforation. VSS. CBC without leukocytosis. CMP unremarkable. She's had no hematemesis or signs to suggest marginal ulcer. Discussed with Dr. Tobi Bastos, who will take for EGD. Admit to hospitalist service.     FINAL CLINICAL IMPRESSION(S) / ED DIAGNOSES   Final diagnoses:  Epigastric pain  Food bolus obstruction of intestine (HCC)     Rx / DC Orders   ED Discharge Orders     None        Note:  This document was prepared using Dragon voice recognition software and may include unintentional dictation errors.   Shaune Pollack, MD 11/23/22 [phone]

## 2022-11-23 NOTE — Assessment & Plan Note (Addendum)
-   Continue home regimen once tolerating p.o. intake

## 2022-11-23 NOTE — ED Notes (Signed)
Advised nurse that patient has ready bed 

## 2022-11-23 NOTE — Consult Note (Signed)
Wyline Mood , MD [address], [address], [place], [place], [address] [address], [address], [place], [place], [address] Phone: [phone]  Fax: [phone]  Consultation  Referring Provider:    Er  Primary Care Physician:  Gardiner Coins, PA-C Primary Gastroenterologist:  Dr. Mia Creek     Reason for Consultation:     dysphagia  Date of Admission:  11/23/2022 Date of Consultation:  11/23/2022         HPI:   Maria Fuller is a 37 y.o. female presented to the emergency room earlier today with abdominal pain nausea vomiting over the past week.  Was worse since last night feels like Food Stuck in Her Stomach.  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  CT scan of the abdomen was performed in the ER that showed postoperative changes of prior sleeve gastrectomy new dilation of the gastric cardia and retained food bolus concerning for bolus food obstruction.  Enteric contrast was seen around the periphery of the food bolus and in the distal small bowel and proximal colon no evidence of bowel obstruction seen.  No recent upper endoscopy.    Past Medical History:  Diagnosis Date   Anemia    slight   Bradycardia    Depression    Diabetes mellitus without complication (HCC)    resolved with weight loss from gastric bypass   Headache    migraines. resolved since gastric bypass   High cholesterol    resolved since weight loss surgery   History of kidney stones    last week   Hypertension    resolved since gastric bypass, severe pre-eclampsia with G1   Incarcerated incisional hernia 07/27/2021   Low BP    Meningitis 2015   Partial small bowel obstruction (HCC) 08/04/2019   PCOS (polycystic ovarian syndrome)    Preterm labor    Recurrent ventral incisional hernia    Sleep apnea    had gastric bypass surgery and now resolved   Syncope    Vertigo couple years ago    Past Surgical History:  Procedure  Laterality Date   CESAREAN SECTION     CESAREAN SECTION N/A 05/07/2016   Procedure: CESAREAN SECTION;  Surgeon: Nadara Mustard, MD;  Location: [facility];  Service: Obstetrics;  Laterality: N/A;  Time of birth: [birth year] Sex: Female Weight: 3940 kg, 8 lb 11 oz   CHOLECYSTECTOMY     CYSTOSCOPY WITH STENT PLACEMENT Right 09/19/2016   Procedure: CYSTOSCOPY WITH STENT PLACEMENT;  Surgeon: Hildred Laser, MD;  Location: [facility];  Service: Urology;  Laterality: Right;   CYSTOSCOPY WITH STENT PLACEMENT Left 04/18/2022   Procedure: CYSTOSCOPY WITH STENT PLACEMENT;  Surgeon: Bjorn Pippin, MD;  Location: [facility];  Service: Urology;  Laterality: Left;   CYSTOSCOPY/URETEROSCOPY/HOLMIUM LASER/STENT PLACEMENT Left 05/08/2022   Procedure: CYSTOSCOPY/URETEROSCOPY/HOLMIUM LASER/STENT EXCHANGE;  Surgeon: Vanna Scotland, MD;  Location: [facility];  Service: Urology;  Laterality: Left;   EXTRACORPOREAL SHOCK WAVE LITHOTRIPSY Left 05/15/2022   Procedure: EXTRACORPOREAL SHOCK WAVE LITHOTRIPSY (ESWL);  Surgeon: Vanna Scotland, MD;  Location: [facility];  Service: Urology;  Laterality: Left;   FLEXIBLE SIGMOIDOSCOPY N/A 07/28/2019   Procedure: FLEXIBLE SIGMOIDOSCOPY;  Surgeon: Toney Reil, MD;  Location: [facility];  Service: Endoscopy;  Laterality: N/A;   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   INSERTION OF MESH N/A 07/07/2022   Procedure: INSERTION OF MESH;  Surgeon: Claudine Mouton,  Katherina Right, MD;  Location: [facility];  Service: General;  Laterality: N/A;   LAPAROSCOPIC OVARIAN CYSTECTOMY Right 09/28/2018   Procedure: LAPAROSCOPIC RIGHT OVARIAN CYSTECTOMY;  Surgeon: Nadara Mustard, MD;  Location: [facility];  Service: Gynecology;  Laterality: Right;   LAPAROSCOPIC UNILATERAL SALPINGO OOPHERECTOMY Right 07/25/2021   Procedure: LAPAROSCOPIC UNILATERAL SALPINGO OOPHORECTOMY;  Surgeon: Nadara Mustard, MD;  Location: [facility];  Service: Gynecology;  Laterality: Right;   LEFT HEART CATH AND CORONARY ANGIOGRAPHY  Left 04/07/2018   Procedure: LEFT HEART CATH AND CORONARY ANGIOGRAPHY;  Surgeon: Alwyn Pea, MD;  Location: [facility];  Service: Cardiovascular;  Laterality: Left;   small bowel obstruction  07/2019   TONSILLECTOMY     URETEROSCOPY WITH HOLMIUM LASER LITHOTRIPSY Right 09/19/2016   Procedure: URETEROSCOPY WITH HOLMIUM LASER LITHOTRIPSY;  Surgeon: Hildred Laser, MD;  Location: [facility];  Service: Urology;  Laterality: Right;   VENTRAL HERNIA REPAIR     scheduled for 07/07/2022   XI ROBOTIC ASSISTED VENTRAL HERNIA N/A 07/27/2021   Procedure: XI ROBOTIC ASSISTED INCARCERATED INCISIONAL HERNIA REPAIR;  Surgeon: Campbell Lerner, MD;  Location: [facility];  Service: General;  Laterality: N/A;   XI ROBOTIC ASSISTED VENTRAL HERNIA N/A 07/07/2022   Procedure: XI ROBOTIC ASSISTED VENTRAL HERNIA, recurrent incisional with mesh;  Surgeon: Campbell Lerner, MD;  Location: [facility];  Service: General;  Laterality: N/A;    Prior to Admission medications   Medication Sig Start Date End Date Taking? Authorizing Provider  scopolamine (TRANSDERM-SCOP) 1 MG/3DAYS SMARTSIG:Patch(s) Topical 08/11/22  Yes [provider]  buPROPion (WELLBUTRIN XL) 150 MG 24 hr tablet Take 150 mg by mouth every morning. 06/17/21   [provider]  dicyclomine (BENTYL) 10 MG capsule Take 1 capsule (10 mg total) by mouth 4 (four) times daily -  before meals and at bedtime. 08/17/22   Jene Every, MD  HYDROcodone-acetaminophen (NORCO) 5-325 MG tablet Take 1-2 tablets by mouth every 6 (six) hours as needed for moderate pain. 08/18/22   Campbell Lerner, MD  hyoscyamine (LEVSIN) 0.125 MG/ML solution Take 0.125 mg by mouth every 4 (four) hours as needed for cramping.    [provider]  levonorgestrel (MIRENA) 20 MCG/24HR IUD 1 each by Intrauterine route once.    [provider]  ondansetron (ZOFRAN-ODT) 4 MG disintegrating tablet Take 1 tablet (4 mg total) by mouth every 8 (eight) hours  as needed for nausea or vomiting. 08/17/22   Jene Every, MD  pantoprazole (PROTONIX) 40 MG tablet Take 40 mg by mouth every morning. 12/19/21   [provider]  sertraline (ZOLOFT) 100 MG tablet Take 100 mg by mouth 2 (two) times daily.    [provider]  traZODone (DESYREL) 50 MG tablet Take 50 mg by mouth at bedtime.  09/06/18 07/07/22  [provider]    Family History  Problem Relation Age of Onset   Polycystic ovary syndrome Mother    Diabetes Father    Bladder Cancer Father    Depression Father    Endometriosis Sister    Polycystic ovary syndrome Sister    Breast cancer Paternal Grandmother    Diabetes Paternal Grandmother    Diabetes Paternal Grandfather    Kidney cancer Neg Hx    Prostate cancer Neg Hx      Social History   Tobacco Use   Smoking status: Never    Passive exposure: Never   Smokeless tobacco: Never  Vaping Use   Vaping status: Never Used  Substance Use  Topics   Alcohol use: No   Drug use: No    Allergies as of 11/23/2022 - Review Complete 11/23/2022  Allergen Reaction Noted   Compazine [prochlorperazine] Other (See Comments) 02/16/2022   Ceftin [cefuroxime axetil] Rash 03/03/2018   Penicillins Rash 06/26/2014    Review of Systems:    All systems reviewed and negative except where noted in HPI.   Physical Exam:  Vital signs in last 24 hours: Temp:  [98.5 F (36.9 C)-99 F (37.2 C)] 98.5 F (36.9 C) (07/21 1600) Pulse Rate:  [52-63] 52 (07/21 1600) Resp:  [16-18] 18 (07/21 1600) BP: (120-124)/(50-79) 124/75 (07/21 1600) SpO2:  [98 %-100 %] 98 % (07/21 1600) Weight:  [106.6 kg] 106.6 kg (07/21 0854) Last BM Date : 11/22/22 General:   Pleasant, cooperative in NAD Head:  Normocephalic and atraumatic. Eyes:   No icterus.   Conjunctiva pink. PERRLA. Ears:  Normal auditory acuity. Neck:  Supple; no masses or thyroidomegaly Lungs: Respirations even and unlabored. Lungs clear to auscultation bilaterally.   No wheezes,  crackles, or rhonchi.  Heart:  Regular rate and rhythm;  Without murmur, clicks, rubs or gallops Abdomen:  Soft, nondistended, nontender. Normal bowel sounds. No appreciable masses or hepatomegaly.  No rebound or guarding.  Neurologic:  Alert and oriented x3;  grossly normal neurologically. Skin:  Intact without significant lesions or rashes. Cervical Nodes:  No significant cervical adenopathy. Psych:  Alert and cooperative. Normal affect.  LAB RESULTS: Recent Labs    11/23/22 0850  WBC 5.7  HGB 10.9*  HCT 33.1*  PLT 298   BMET Recent Labs    11/23/22 0850  NA 137  K 3.5  CL 110  CO2 21*  GLUCOSE 86  BUN 10  CREATININE 0.65  CALCIUM 8.0*   LFT Recent Labs    11/23/22 0850  PROT 7.1  ALBUMIN 4.2  AST 16  ALT 21  ALKPHOS 69  BILITOT 0.7   PT/INR No results for input(s): "LABPROT", "INR" in the last 72 hours.  STUDIES: CT ABDOMEN PELVIS W CONTRAST  Result Date: 11/23/2022 CLINICAL DATA:  Bowel obstruction suspected. EXAM: CT ABDOMEN AND PELVIS WITH CONTRAST TECHNIQUE: Multidetector CT imaging of the abdomen and pelvis was performed using the standard protocol following bolus administration of intravenous contrast. RADIATION DOSE REDUCTION: This exam was performed according to the departmental dose-optimization program which includes automated exposure control, adjustment of the mA and/or kV according to patient size and/or use of iterative reconstruction technique. CONTRAST:  OMNIPAQUE IOHEXOL 300 MG/ML  SOLN COMPARISON:  CT abdomen/pelvis 08/17/2022. FINDINGS: Lower chest: No acute abnormality. Hepatobiliary: No focal liver abnormality is seen. Status post cholecystectomy. No biliary dilatation. Pancreas: Unremarkable. No pancreatic ductal dilatation or surrounding inflammatory changes. Spleen: Normal. Adrenals/Urinary Tract: Adrenal glands are unremarkable. Kidneys are normal, without renal calculi, focal lesion, or hydronephrosis. Bladder is unremarkable.  Stomach/Bowel: Postoperative changes of sleeve gastrectomy. New dilation of the gastric cardia with retained food bolus. Enteric contrast is seen around the periphery of the food bolus and in the distal small bowel and proximal colon. No dilated loops of small bowel. Postoperative changes of partial bowel resection. Colon is otherwise unremarkable. No wall thickening or surrounding inflammation. Vascular/Lymphatic: No significant vascular findings are present. No enlarged abdominal or pelvic lymph nodes. Reproductive: IUD in place.  No adnexal masses. Other: No abdominal wall hernia or abnormality. No abdominopelvic ascites. 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 IMPRESSION: Postoperative changes of prior  sleeve gastrectomy. New dilation of the gastric cardia with retained food bolus, concerning for food bolus obstruction. Enteric contrast is seen around the periphery of the food bolus and in the distal small bowel and proximal colon. No evidence of small-bowel obstruction. Electronically Signed   By: Orvan Falconer M.D.   On: 11/23/2022 13:15      Impression / Plan:   Maria Fuller is a 37 y.o. y/o female with short history of nausea vomiting.  CT scan of the abdomen shows retained food in the gastric cardia history of gastric sleeve.  Plan 1.  EGD    I have discussed alternative options, risks & benefits,  which include, but are not limited to, bleeding, infection, perforation,respiratory complication & drug reaction.  The patient agrees with this plan & written consent will be obtained.     Thank you for involving me in the care of this patient.      LOS: 0 days   Wyline Mood, MD  11/23/2022, 5:03 PM

## 2022-11-23 NOTE — Anesthesia Postprocedure Evaluation (Signed)
Anesthesia Post Note  Patient: Maria Fuller  Procedure(s) Performed: ESOPHAGOGASTRODUODENOSCOPY (EGD) WITH PROPOFOL  Patient location during evaluation: Endoscopy Anesthesia Type: General Level of consciousness: awake and alert Pain management: pain level controlled Vital Signs Assessment: post-procedure vital signs reviewed and stable Respiratory status: spontaneous breathing, nonlabored ventilation, respiratory function stable and patient connected to nasal cannula oxygen Cardiovascular status: blood pressure returned to baseline and stable Postop Assessment: no apparent nausea or vomiting Anesthetic complications: no   No notable events documented.   Last Vitals:  Vitals:   11/23/22 1759 11/23/22 1824  BP: 135/72 117/68  Pulse: 67   Resp: 18   Temp: (!) 36.4 C   SpO2: 97%     Last Pain:  Vitals:   11/23/22 1824  TempSrc:   PainSc: 0-No pain                 [name]

## 2022-11-23 NOTE — ED Triage Notes (Signed)
Pt states coming in with abdominal pain that started yesterday and nausea started this morning. Pt states taking 4mg  of zofran at 2am. Pt states history of bariatric surgery with internal hernias and bowel obstructions. Pt states that she is still having BM's but that they have slowed down.

## 2022-11-23 NOTE — Plan of Care (Signed)
  Problem: Education: Goal: Knowledge of General Education information will improve Description Including pain rating scale, medication(s)/side effects and non-pharmacologic comfort measures Outcome: Progressing   

## 2022-11-24 ENCOUNTER — Encounter: Payer: Self-pay | Admitting: Gastroenterology

## 2022-11-24 DIAGNOSIS — Z881 Allergy status to other antibiotic agents status: Secondary | ICD-10-CM | POA: Diagnosis not present

## 2022-11-24 DIAGNOSIS — Z6831 Body mass index (BMI) 31.0-31.9, adult: Secondary | ICD-10-CM | POA: Diagnosis not present

## 2022-11-24 DIAGNOSIS — Z9049 Acquired absence of other specified parts of digestive tract: Secondary | ICD-10-CM | POA: Diagnosis not present

## 2022-11-24 DIAGNOSIS — Z8639 Personal history of other endocrine, nutritional and metabolic disease: Secondary | ICD-10-CM | POA: Diagnosis not present

## 2022-11-24 DIAGNOSIS — Z87442 Personal history of urinary calculi: Secondary | ICD-10-CM | POA: Diagnosis not present

## 2022-11-24 DIAGNOSIS — F419 Anxiety disorder, unspecified: Secondary | ICD-10-CM | POA: Diagnosis present

## 2022-11-24 DIAGNOSIS — Z79899 Other long term (current) drug therapy: Secondary | ICD-10-CM | POA: Diagnosis not present

## 2022-11-24 DIAGNOSIS — I10 Essential (primary) hypertension: Secondary | ICD-10-CM | POA: Diagnosis present

## 2022-11-24 DIAGNOSIS — Z803 Family history of malignant neoplasm of breast: Secondary | ICD-10-CM | POA: Diagnosis not present

## 2022-11-24 DIAGNOSIS — K9589 Other complications of other bariatric procedure: Secondary | ICD-10-CM | POA: Diagnosis present

## 2022-11-24 DIAGNOSIS — Z818 Family history of other mental and behavioral disorders: Secondary | ICD-10-CM | POA: Diagnosis not present

## 2022-11-24 DIAGNOSIS — W44F3XA Food entering into or through a natural orifice, initial encounter: Secondary | ICD-10-CM | POA: Diagnosis not present

## 2022-11-24 DIAGNOSIS — Z8052 Family history of malignant neoplasm of bladder: Secondary | ICD-10-CM | POA: Diagnosis not present

## 2022-11-24 DIAGNOSIS — R1013 Epigastric pain: Secondary | ICD-10-CM | POA: Diagnosis present

## 2022-11-24 DIAGNOSIS — Z880 Allergy status to penicillin: Secondary | ICD-10-CM | POA: Diagnosis not present

## 2022-11-24 DIAGNOSIS — K56699 Other intestinal obstruction unspecified as to partial versus complete obstruction: Secondary | ICD-10-CM | POA: Diagnosis present

## 2022-11-24 DIAGNOSIS — F32A Depression, unspecified: Secondary | ICD-10-CM | POA: Diagnosis present

## 2022-11-24 DIAGNOSIS — Z833 Family history of diabetes mellitus: Secondary | ICD-10-CM | POA: Diagnosis not present

## 2022-11-24 DIAGNOSIS — E7800 Pure hypercholesterolemia, unspecified: Secondary | ICD-10-CM | POA: Diagnosis present

## 2022-11-24 DIAGNOSIS — Z9884 Bariatric surgery status: Secondary | ICD-10-CM | POA: Diagnosis not present

## 2022-11-24 DIAGNOSIS — R1310 Dysphagia, unspecified: Secondary | ICD-10-CM | POA: Diagnosis present

## 2022-11-24 DIAGNOSIS — Z8661 Personal history of infections of the central nervous system: Secondary | ICD-10-CM | POA: Diagnosis not present

## 2022-11-24 DIAGNOSIS — E282 Polycystic ovarian syndrome: Secondary | ICD-10-CM | POA: Diagnosis present

## 2022-11-24 DIAGNOSIS — E668 Other obesity: Secondary | ICD-10-CM | POA: Diagnosis present

## 2022-11-24 LAB — CBC
HCT: 31.5 % — ABNORMAL LOW (ref 36.0–46.0)
Hemoglobin: 10.5 g/dL — ABNORMAL LOW (ref 12.0–15.0)
MCH: 30.9 pg (ref 26.0–34.0)
MCHC: 33.3 g/dL (ref 30.0–36.0)
MCV: 92.6 fL (ref 80.0–100.0)
Platelets: 305 10*3/uL (ref 150–400)
RBC: 3.4 MIL/uL — ABNORMAL LOW (ref 3.87–5.11)
RDW: 12.3 % (ref 11.5–15.5)
WBC: 8.3 10*3/uL (ref 4.0–10.5)
nRBC: 0 % (ref 0.0–0.2)

## 2022-11-24 LAB — GLUCOSE, CAPILLARY: Glucose-Capillary: 78 mg/dL (ref 70–99)

## 2022-11-24 MED ORDER — PANTOPRAZOLE SODIUM 40 MG PO TBEC
40.0000 mg | DELAYED_RELEASE_TABLET | Freq: Two times a day (BID) | ORAL | Status: DC
  Administered 2022-11-24 – 2022-11-26 (×5): 40 mg via ORAL
  Filled 2022-11-24 (×5): qty 1

## 2022-11-24 MED ORDER — ERYTHROMYCIN BASE 250 MG PO TABS
500.0000 mg | ORAL_TABLET | Freq: Three times a day (TID) | ORAL | Status: DC
  Administered 2022-11-24: 500 mg via ORAL
  Filled 2022-11-24 (×3): qty 2

## 2022-11-24 MED ORDER — ALPRAZOLAM 0.5 MG PO TABS
1.0000 mg | ORAL_TABLET | Freq: Once | ORAL | Status: AC
  Administered 2022-11-24: 1 mg via ORAL
  Filled 2022-11-24: qty 2

## 2022-11-24 MED ORDER — SERTRALINE HCL 50 MG PO TABS: 100.0000 mg | ORAL_TABLET | Freq: Two times a day (BID) | ORAL | Status: DC

## 2022-11-24 MED ORDER — TRAZODONE HCL 50 MG PO TABS
50.0000 mg | ORAL_TABLET | Freq: Every day | ORAL | Status: DC
  Administered 2022-11-24 – 2022-11-25 (×2): 50 mg via ORAL
  Filled 2022-11-24 (×2): qty 1

## 2022-11-24 MED ORDER — METOCLOPRAMIDE HCL 5 MG/ML IJ SOLN
10.0000 mg | Freq: Four times a day (QID) | INTRAMUSCULAR | Status: DC
  Administered 2022-11-24: 10 mg via INTRAVENOUS
  Filled 2022-11-24: qty 2

## 2022-11-24 MED ORDER — SERTRALINE HCL 50 MG PO TABS
100.0000 mg | ORAL_TABLET | Freq: Every day | ORAL | Status: DC
  Administered 2022-11-24 – 2022-11-26 (×3): 100 mg via ORAL
  Filled 2022-11-24 (×3): qty 2

## 2022-11-24 MED ORDER — BUPROPION HCL ER (XL) 150 MG PO TB24
150.0000 mg | ORAL_TABLET | ORAL | Status: DC
  Administered 2022-11-25 – 2022-11-26 (×2): 150 mg via ORAL
  Filled 2022-11-24 (×2): qty 1

## 2022-11-24 MED ORDER — SODIUM CHLORIDE 0.9 % IV SOLN
500.0000 mg | Freq: Three times a day (TID) | INTRAVENOUS | Status: DC
  Administered 2022-11-24 – 2022-11-25 (×2): 500 mg via INTRAVENOUS
  Filled 2022-11-24 (×4): qty 10

## 2022-11-24 NOTE — Op Note (Signed)
[facility] Gastroenterology Patient Name: Maria Fuller Procedure Date: 11/23/2022 5:11 PM MRN: [id number] Account #: [id number] Date of Birth: 1986-01-18 Admit Type: Inpatient Age: 37 Room: [facility] ENDO ROOM 4 Gender: Female Note Status: Finalized Instrument Name: Patton Salles Endoscope [id number] Procedure:             Upper GI endoscopy Indications:           Dysphagia Providers:             Wyline Mood MD, MD Medicines:             Monitored Anesthesia Care Complications:         No immediate complications. Procedure:             Pre-Anesthesia Assessment:                        - Prior to the procedure, a History and Physical was                         performed, and patient medications, allergies and                         sensitivities were reviewed. The patient's tolerance                         of previous anesthesia was reviewed.                        - The risks and benefits of the procedure and the                         sedation options and risks were discussed with the                         patient. All questions were answered and informed                         consent was obtained.                        - ASA Grade Assessment: II - A patient with mild                         systemic disease.                        After obtaining informed consent, the endoscope was                         passed under direct vision. Throughout the procedure,                         the patient's blood pressure, pulse, and oxygen                         saturations were monitored continuously. The Endoscope                         was introduced through the mouth, and advanced to the  third part of duodenum. The upper GI endoscopy was                         accomplished with ease. The patient tolerated the                         procedure well. Findings:      The esophagus was normal.      A large amount of food (residue) was found on the  greater curvature of       the stomach.      The exam was otherwise without abnormality. Impression:            - Normal esophagus.                        - A large amount of food (residue) in the stomach.                        - The examination was otherwise normal.                        - No specimens collected. Recommendation:        - Return patient to hospital ward for ongoing care.                        - The food bolus is in the stomach with no obstruction                         , suggest keeping head end of bed at 45 degrees , keep                         on clears today and only advance slowly as tolerated .                         May take 48 hours for the large qty of food in stomach                         to pass Procedure Code(s):     --- Professional ---                        [id number], Esophagogastroduodenoscopy, flexible,                         transoral; diagnostic, including collection of                         specimen(s) by brushing or washing, when performed                         (separate procedure) Diagnosis Code(s):     --- Professional ---                        R13.10, Dysphagia, unspecified CPT copyright 2022 [employer]. All rights reserved. The codes documented in this report are preliminary and upon coder review may  be revised to meet current compliance requirements. Wyline Mood, MD Wyline Mood MD, MD 11/23/2022 5:53:58 PM This report has  been signed electronically. Number of Addenda: 0 Note Initiated On: 11/23/2022 5:11 PM Estimated Blood Loss:  Estimated blood loss: none.      [facility]

## 2022-11-24 NOTE — Progress Notes (Addendum)
Arlyss Repress, MD [address]  [address]  [place], [place] [address]  Main: [phone]  Fax: [phone] Pager: [phone]   Subjective: Patient was originally admitted for dysphagia.  Underwent upper endoscopy yesterday, there was no food bolus in esophagus.  Her stomach was full of food.  GI is requested to see her back because of severe nausea and epigastric pain, patient is kept n.p.o. patient reports feeling anxious on Reglan as well as Compazine every time she takes.  She reports diarrhea which is chronic.  She does report intermittent episodes of nausea and vomiting with abdominal discomfort and bloating.   Objective: Vital signs in last 24 hours: Vitals:   11/23/22 1924 11/24/22 0305 11/24/22 0822 11/24/22 1608  BP: 117/67 115/71 117/71 124/74  Pulse: (!) 58 (!) 49 (!) 52 (!) 51  Resp: 16 16 16 18   Temp: 97.8 F (36.6 C) 98.4 F (36.9 C) 98.8 F (37.1 C) 98.4 F (36.9 C)  TempSrc: Oral Oral Oral Oral  SpO2: 100% 97% 97% 100%  Weight:      Height:       Weight change:   Intake/Output Summary (Last 24 hours) at 11/24/2022 1838 Last data filed at 11/24/2022 0425 Gross per 24 hour  Intake 524.97 ml  Output 0 ml  Net 524.97 ml     Exam: Heart:: Regular rate and rhythm, S1S2 present, or without murmur or extra heart sounds Lungs: normal and clear to auscultation Abdomen: Mild epigastric discomfort   Lab Results:    Latest Ref Rng & Units 11/24/2022    5:15 AM 11/23/2022    8:50 AM 08/17/2022   11:45 AM  CBC  WBC 4.0 - 10.5 K/uL 8.3  5.7  6.7   Hemoglobin 12.0 - 15.0 g/dL 40.1  02.7  25.3   Hematocrit 36.0 - 46.0 % 31.5  33.1  32.7   Platelets 150 - 400 K/uL 305  298  329       Latest Ref Rng & Units 11/23/2022    8:50 AM 08/17/2022   11:45 AM 05/27/2022    3:22 PM  CMP  Glucose 70 - 99 mg/dL 86  87  82   BUN 6 - 20 mg/dL 10  14  10    Creatinine 0.44 - 1.00 mg/dL 6.64  4.03  4.74   Sodium 135 - 145 mmol/L 137  141  138   Potassium 3.5 -  5.1 mmol/L 3.5  3.9  4.1   Chloride 98 - 111 mmol/L 110  112  109   CO2 22 - 32 mmol/L 21  22  23    Calcium 8.9 - 10.3 mg/dL 8.0  8.6  8.2   Total Protein 6.5 - 8.1 g/dL 7.1  7.0  6.9   Total Bilirubin 0.3 - 1.2 mg/dL 0.7  1.0  0.7   Alkaline Phos 38 - 126 U/L 69  83  84   AST 15 - 41 U/L 16  14  16    ALT 0 - 44 U/L 21  21  15      Micro Results: No results found for this or any previous visit (from the past 240 hour(s)). Studies/Results: CT ABDOMEN PELVIS W CONTRAST  Result Date: 11/23/2022 CLINICAL DATA:  Bowel obstruction suspected. EXAM: CT ABDOMEN AND PELVIS WITH CONTRAST TECHNIQUE: Multidetector CT imaging of the abdomen and pelvis was performed using the standard protocol following bolus administration of intravenous contrast. RADIATION DOSE REDUCTION: This exam was performed according to the departmental dose-optimization  program which includes automated exposure control, adjustment of the mA and/or kV according to patient size and/or use of iterative reconstruction technique. CONTRAST:  OMNIPAQUE IOHEXOL 300 MG/ML  SOLN COMPARISON:  CT abdomen/pelvis 08/17/2022. FINDINGS: Lower chest: No acute abnormality. Hepatobiliary: No focal liver abnormality is seen. Status post cholecystectomy. No biliary dilatation. Pancreas: Unremarkable. No pancreatic ductal dilatation or surrounding inflammatory changes. Spleen: Normal. Adrenals/Urinary Tract: Adrenal glands are unremarkable. Kidneys are normal, without renal calculi, focal lesion, or hydronephrosis. Bladder is unremarkable. Stomach/Bowel: Postoperative changes of sleeve gastrectomy. New dilation of the gastric cardia with retained food bolus. Enteric contrast is seen around the periphery of the food bolus and in the distal small bowel and proximal colon. No dilated loops of small bowel. Postoperative changes of partial bowel resection. Colon is otherwise unremarkable. No wall thickening or surrounding inflammation. Vascular/Lymphatic: No  significant vascular findings are present. No enlarged abdominal or pelvic lymph nodes. Reproductive: IUD in place.  No adnexal masses. Other: No abdominal wall hernia or abnormality. No abdominopelvic ascites. 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 IMPRESSION: Postoperative changes of prior sleeve gastrectomy. New dilation of the gastric cardia with retained food bolus, concerning for food bolus obstruction. Enteric contrast is seen around the periphery of the food bolus and in the distal small bowel and proximal colon. No evidence of small-bowel obstruction. Electronically Signed   By: Orvan Falconer M.D.   On: 11/23/2022 13:15   Medications: I have reviewed the patient's current medications. Prior to Admission:  Medications Prior to Admission  Medication Sig Dispense Refill Last Dose   buPROPion (WELLBUTRIN XL) 150 MG 24 hr tablet Take 150 mg by mouth every morning.   Past Week   dicyclomine (BENTYL) 10 MG capsule Take 1 capsule (10 mg total) by mouth 4 (four) times daily -  before meals and at bedtime. 30 capsule 0 Past Week   pantoprazole (PROTONIX) 40 MG tablet Take 40 mg by mouth every morning.   Past Week   scopolamine (TRANSDERM-SCOP) 1 MG/3DAYS 1 patch every three (3) days as needed.   Past Month   sertraline (ZOLOFT) 100 MG tablet Take 100 mg by mouth 2 (two) times daily.   Past Week   traZODone (DESYREL) 50 MG tablet Take 50 mg by mouth at bedtime.    Past Week   HYDROcodone-acetaminophen (NORCO) 5-325 MG tablet Take 1-2 tablets by mouth every 6 (six) hours as needed for moderate pain. (Patient not taking: Reported on 11/24/2022) 15 tablet 0 Not Taking   hyoscyamine (LEVSIN) 0.125 MG/ML solution Take 0.125 mg by mouth every 4 (four) hours as needed for cramping.   prn   levonorgestrel (MIRENA) 20 MCG/24HR IUD 1 each by Intrauterine route once.      ondansetron (ZOFRAN-ODT) 4 MG disintegrating tablet Take 1 tablet (4 mg  total) by mouth every 8 (eight) hours as needed for nausea or vomiting. 20 tablet 0 prn   Scheduled:  ALPRAZolam  1 mg Oral Once   [START ON 11/25/2022] buPROPion  150 mg Oral BH-q7a   enoxaparin (LOVENOX) injection  50 mg Subcutaneous Q24H   erythromycin  500 mg Oral TID   pantoprazole  40 mg Oral BID   sertraline  100 mg Oral Daily   sodium chloride flush  3 mL Intravenous Q12H   traZODone  50 mg Oral QHS   Continuous: BZJ:IRCVELFYBOFBP (DILAUDID) injection, ondansetron **OR** ondansetron (ZOFRAN) IV Anti-infectives (From admission, onward)    Start  Dose/Rate Route Frequency Ordered Stop   11/24/22 1815  erythromycin (E-MYCIN) tablet 500 mg        500 mg Oral 3 times daily 11/24/22 1715        Scheduled Meds:  ALPRAZolam  1 mg Oral Once   [START ON 11/25/2022] buPROPion  150 mg Oral BH-q7a   enoxaparin (LOVENOX) injection  50 mg Subcutaneous Q24H   erythromycin  500 mg Oral TID   pantoprazole  40 mg Oral BID   sertraline  100 mg Oral Daily   sodium chloride flush  3 mL Intravenous Q12H   traZODone  50 mg Oral QHS   Continuous Infusions: PRN Meds:.HYDROmorphone (DILAUDID) injection, ondansetron **OR** ondansetron (ZOFRAN) IV   Assessment: Principal Problem:   Food bolus obstruction of intestine (HCC) Active Problems:   History of diabetes mellitus, type II   Status post bariatric surgery   Depression  37 year old female with history of duodenal switch, incisional hernia x 2, possibly a gastric sleeve presented with dysphagia, upper abdominal pain associated with nausea and vomiting for last 1 week.  CT abdomen in the ER revealed postoperative changes of prior gastric sleeve and retained food bolus in the gastric cardia concerning for food bolus obstruction.  Patient therefore underwent upper endoscopy which revealed large amount of food in the stomach, no evidence of food bolus in esophagus.  Further examination could not be continued due to food in the  stomach  Plan: Upper abdominal pain with nausea and vomiting EGD revealed food in the entire stomach Recommend to switch from Reglan to erythromycin 500 mg 3 times daily as requested by patient due to intolerance to Reglan Start Protonix 40 mg p.o./IV twice daily Avoid opioid use Tentative plan to repeat upper endoscopy to evaluate the anatomy of stomach on 7/24 Clear liquid diet as tolerated Discussed my recommendations with patient and she is agreeable with the plan    LOS: 0 days   Ashla Murph 11/24/2022, 6:38 PM

## 2022-11-24 NOTE — Progress Notes (Signed)
Progress Note   Patient: Maria Fuller WJX:[id number] DOB: 1985/11/12 DOA: 11/23/2022     0 DOS: the patient was seen and examined on 11/24/2022   Subjective:  Patient seen and examined at bedside this morning Did have some abdominal pain this morning Having some nausea but no vomiting Denies chest pain headache or urinary complaints     Brief hospital course: From HPI "STARLETTE THUROW is a 37 y.o. female with medical history significant of s/p gastric bypass, ventral hernias complicated by bowel obstruction s/p repair, previous type 2 diabetes and hypertension no longer requiring treatment after bypass, depression/anxiety, who presents to the ED due to abdominal pain. She notes some pressure in her chest that radiates to her left axilla when trying to swallow. On arrival to the ED, patient was normotensive at 123/50 with heart rate of 63, she was saturating at 100% on room air with respiratory rate of 17.  She was afebrile at 99.  Initial workup notable for WBC 5.7, hemoglobin 10.9, bicarb 21, creatinine 0.65 with GFR above 60.  Urine pregnancy test negative. T of the abdomen was attained  Urinalysis with no abnormal findings.  See that demonstrated new dilation of the gastric cardia with retained food bolus concerning for food bolus obstruction.  Underwent EGD emergently by gastroenterologist with removal of food bolus and TRH consulted for admission for further management.  Assessment and Plan:   Food bolus obstruction of intestine [facility]) Patient underwent endoscopy yesterday with removal of food bolus Gastroenterologist planning another endoscopy today Continue current as needed pain medication I personally reviewed patient's CT scan of the abdomen results that showed findings of gastric dilatation status post bariatric surgery Continue multivitamins Plan of care discussed with gastroenterologist   Depression - Continue current home medications   History of diabetes mellitus, type  II Resolved after gastric bypass.  No longer on home medications Continue to monitor glucose closely.   Advance Care Planning:   Code Status: Full Code verified by patient   Consults: GI   Physical Exam: General: In no acute distress laying in bed HENT:     Head: Normocephalic and atraumatic.  Cardiovascular:     Rate and Rhythm: Normal rate and regular rhythm.     Heart sounds: No murmur heard. Pulmonary: Clear to auscultation bilaterally Abdominal: Epigastric tenderness Musculoskeletal:     Right lower leg: No edema.     Left lower leg: No edema.  Skin:    General: Skin is warm and dry.  Neurological:     General: No focal deficit present.     Mental Status: She is alert and oriented to person, place, and time.  Psychiatric:        Mood and Affect: Mood normal.        Behavior: Behavior normal.   Data Reviewed: I have reviewed patient's old records, CBC, CMP and gastroenterology documentation   Family Communication: No family present at bedside at this time  Time spent: 55 minutes spent reviewing patient's old records, gastroenterology documentation, lab results, as well as discussing plans of care with patient     Vitals:   11/23/22 1924 11/24/22 0305 11/24/22 0822 11/24/22 1608  BP: 117/67 115/71 117/71 124/74  Pulse: (!) 58 (!) 49 (!) 52 (!) 51  Resp: 16 16 16 18   Temp: 97.8 F (36.6 C) 98.4 F (36.9 C) 98.8 F (37.1 C) 98.4 F (36.9 C)  TempSrc: Oral Oral Oral Oral  SpO2: 100% 97% 97% 100%  Weight:  Height:        Author: Loyce Dys, MD 11/24/2022 4:09 PM  For on call review [email or web address].

## 2022-11-25 DIAGNOSIS — W44F3XA Food entering into or through a natural orifice, initial encounter: Secondary | ICD-10-CM | POA: Diagnosis not present

## 2022-11-25 DIAGNOSIS — K56699 Other intestinal obstruction unspecified as to partial versus complete obstruction: Secondary | ICD-10-CM | POA: Diagnosis not present

## 2022-11-25 LAB — CBC WITH DIFFERENTIAL/PLATELET
Abs Immature Granulocytes: 0.02 10*3/uL (ref 0.00–0.07)
Basophils Absolute: 0 10*3/uL (ref 0.0–0.1)
Basophils Relative: 1 %
Eosinophils Absolute: 0.1 10*3/uL (ref 0.0–0.5)
Eosinophils Relative: 2 %
HCT: 31.3 % — ABNORMAL LOW (ref 36.0–46.0)
Hemoglobin: 10.7 g/dL — ABNORMAL LOW (ref 12.0–15.0)
Immature Granulocytes: 0 %
Lymphocytes Relative: 38 %
Lymphs Abs: 2.9 10*3/uL (ref 0.7–4.0)
MCH: 32.3 pg (ref 26.0–34.0)
MCHC: 34.2 g/dL (ref 30.0–36.0)
MCV: 94.6 fL (ref 80.0–100.0)
Monocytes Absolute: 0.4 10*3/uL (ref 0.1–1.0)
Monocytes Relative: 6 %
Neutro Abs: 4.2 10*3/uL (ref 1.7–7.7)
Neutrophils Relative %: 53 %
Platelets: 296 10*3/uL (ref 150–400)
RBC: 3.31 MIL/uL — ABNORMAL LOW (ref 3.87–5.11)
RDW: 12.4 % (ref 11.5–15.5)
WBC: 7.8 10*3/uL (ref 4.0–10.5)
nRBC: 0 % (ref 0.0–0.2)

## 2022-11-25 LAB — BASIC METABOLIC PANEL
Anion gap: 6 (ref 5–15)
BUN: 15 mg/dL (ref 6–20)
CO2: 25 mmol/L (ref 22–32)
Calcium: 8.3 mg/dL — ABNORMAL LOW (ref 8.9–10.3)
Chloride: 107 mmol/L (ref 98–111)
Creatinine, Ser: 0.66 mg/dL (ref 0.44–1.00)
GFR, Estimated: >60 mL/min (ref >60)
Glucose, Bld: 85 mg/dL (ref 70–99)
Potassium: 3.3 mmol/L — ABNORMAL LOW (ref 3.5–5.1)
Sodium: 138 mmol/L (ref 135–145)

## 2022-11-25 MED ORDER — POTASSIUM CHLORIDE CRYS ER 20 MEQ PO TBCR
40.0000 meq | EXTENDED_RELEASE_TABLET | ORAL | Status: AC
  Administered 2022-11-25 (×2): 40 meq via ORAL
  Filled 2022-11-25 (×2): qty 2

## 2022-11-25 MED ORDER — SODIUM CHLORIDE 0.9 % IV SOLN: INTRAVENOUS | Status: DC

## 2022-11-25 MED ORDER — SODIUM CHLORIDE 0.9 % IV SOLN
12.5000 mg | Freq: Once | INTRAVENOUS | Status: AC
  Administered 2022-11-25: 12.5 mg via INTRAVENOUS
  Filled 2022-11-25: qty 0.5
  Filled 2022-11-25: qty 6.25

## 2022-11-25 MED ORDER — DIPHENHYDRAMINE HCL 50 MG/ML IJ SOLN: 25.0000 mg | Freq: Once | INTRAMUSCULAR | Status: DC

## 2022-11-25 MED ORDER — ERYTHROMYCIN BASE 250 MG PO TBEC
250.0000 mg | DELAYED_RELEASE_TABLET | Freq: Three times a day (TID) | ORAL | Status: DC
  Administered 2022-11-25 – 2022-11-26 (×4): 250 mg via ORAL
  Filled 2022-11-25 (×5): qty 1

## 2022-11-25 NOTE — Progress Notes (Signed)
Progress Note   Patient: Maria Fuller:[id number] DOB: 12-07-1985 DOA: 11/23/2022     1 DOS: the patient was seen and examined on 11/25/2022    Subjective:  Patient seen and examined on rounds this morning She did complain of some abdominal pain but admits to improvement Nausea and vomiting improving Denies headache cough chest pain or urinary complaints Gastroenterologist planning on repeat EGD tomorrow to evaluate the anatomy of the stomach   Brief hospital course: From HPI "Maria Fuller is a 37 y.o. female with medical history significant of s/p gastric bypass, ventral hernias complicated by bowel obstruction s/p repair, previous type 2 diabetes and hypertension no longer requiring treatment after bypass, depression/anxiety, who presents to the ED due to abdominal pain. She notes some pressure in her chest that radiates to her left axilla when trying to swallow. On arrival to the ED, patient was normotensive at 123/50 with heart rate of 63, she was saturating at 100% on room air with respiratory rate of 17.  She was afebrile at 99.  Initial workup notable for WBC 5.7, hemoglobin 10.9, bicarb 21, creatinine 0.65 with GFR above 60.  Urine pregnancy test negative. T of the abdomen was attained  Urinalysis with no abnormal findings.  See that demonstrated new dilation of the gastric cardia with retained food bolus concerning for food bolus obstruction.  Underwent EGD emergently by gastroenterologist with removal of food bolus and TRH consulted for admission for further management.  "   Assessment and Plan:    Food bolus obstruction of intestine (HCC) Patient underwent endoscopy 11/23/2022 with removal of food bolus Continue current as needed pain medication I personally reviewed patient's CT scan of the abdomen results that showed findings of gastric dilatation status post bariatric surgery Continue multivitamins Continue PPI therapy as recommended by gastroenterologist I discussed  the case with GI today Gastroenterologist planning on repeat EGD tomorrow 11/26/2022 to evaluate the anatomy of the stomach Patient also initiated on erythromycin 250 mg 3 times daily for motility by GI.   Depression Continue bupropion, trazodone, sertraline   History of diabetes mellitus, type II Resolved after gastric bypass.  No longer on home medications Continue to monitor glucose level closely   Advance Care Planning:   Code Status: Full Code verified by patient   Consults: GI     Physical Exam: General: Young female laying in bed in no acute distress HENT:     Head: Normocephalic and atraumatic.  Cardiovascular:     Rate and Rhythm: Normal rate and regular rhythm.     Heart sounds: No murmur heard. Pulmonary: Clear to auscultation bilaterally Abdominal: Epigastric tenderness-improved Musculoskeletal:     Right lower leg: No edema.     Left lower leg: No edema.  Skin:    General: Skin is warm and dry.  Neurological:     General: No focal deficit present.     Mental Status: She is alert and oriented to person, place, and time.  Psychiatric:        Mood and Affect: Mood normal.        Behavior: Behavior normal.    Data Reviewed: I have reviewed CBC, BMP as well as nursing documentation and gastroenterologist documentation     Family Communication: No family present at bedside at this time   Time spent: 55 minutes spent reviewing patient's old records, gastroenterology documentation, lab results, as well as discussing plans of care with patient     Vitals:   11/24/22 2045 11/25/22  0551 11/25/22 0805 11/25/22 1546  BP: (!) 112/58 110/71 108/73 110/64  Pulse: (!) 55 (!) 49 (!) 46 (!) 50  Resp: 20 16 18 18   Temp: 98.4 F (36.9 C) 98.5 F (36.9 C) 98.4 F (36.9 C) 98.1 F (36.7 C)  TempSrc: Oral Oral Oral Oral  SpO2: 100% 98% 100% 99%  Weight:      Height:         Author: Loyce Dys, MD 11/25/2022 5:16 PM  For on call review [email or web address].

## 2022-11-26 ENCOUNTER — Inpatient Hospital Stay: Payer: BC Managed Care – PPO | Admitting: Certified Registered"

## 2022-11-26 ENCOUNTER — Encounter: Payer: Self-pay | Admitting: Internal Medicine

## 2022-11-26 ENCOUNTER — Encounter
Admission: EM | Disposition: A | Discharge status: Home / Self Care | Payer: Self-pay | Source: Home / Self Care | Attending: Internal Medicine

## 2022-11-26 DIAGNOSIS — W44F3XA Food entering into or through a natural orifice, initial encounter: Secondary | ICD-10-CM | POA: Diagnosis not present

## 2022-11-26 DIAGNOSIS — Z9884 Bariatric surgery status: Secondary | ICD-10-CM | POA: Diagnosis not present

## 2022-11-26 DIAGNOSIS — K56699 Other intestinal obstruction unspecified as to partial versus complete obstruction: Secondary | ICD-10-CM | POA: Diagnosis not present

## 2022-11-26 DIAGNOSIS — R1013 Epigastric pain: Secondary | ICD-10-CM | POA: Diagnosis not present

## 2022-11-26 HISTORY — PX: BIOPSY: SHX5522

## 2022-11-26 HISTORY — PX: ESOPHAGOGASTRODUODENOSCOPY (EGD) WITH PROPOFOL: SHX5813

## 2022-11-26 LAB — CBC WITH DIFFERENTIAL/PLATELET
Abs Immature Granulocytes: 0.02 10*3/uL (ref 0.00–0.07)
Basophils Absolute: 0 10*3/uL (ref 0.0–0.1)
Basophils Relative: 0 %
Eosinophils Absolute: 0.1 10*3/uL (ref 0.0–0.5)
Eosinophils Relative: 3 %
HCT: 29.4 % — ABNORMAL LOW (ref 36.0–46.0)
Hemoglobin: 9.6 g/dL — ABNORMAL LOW (ref 12.0–15.0)
Immature Granulocytes: 0 %
Lymphocytes Relative: 48 %
Lymphs Abs: 2.2 10*3/uL (ref 0.7–4.0)
MCH: 31.6 pg (ref 26.0–34.0)
MCHC: 32.7 g/dL (ref 30.0–36.0)
MCV: 96.7 fL (ref 80.0–100.0)
Monocytes Absolute: 0.3 10*3/uL (ref 0.1–1.0)
Monocytes Relative: 7 %
Neutro Abs: 1.9 10*3/uL (ref 1.7–7.7)
Neutrophils Relative %: 42 %
Platelets: 270 10*3/uL (ref 150–400)
RBC: 3.04 MIL/uL — ABNORMAL LOW (ref 3.87–5.11)
RDW: 12.5 % (ref 11.5–15.5)
WBC: 4.5 10*3/uL (ref 4.0–10.5)
nRBC: 0 % (ref 0.0–0.2)

## 2022-11-26 LAB — BASIC METABOLIC PANEL
Anion gap: 3 — ABNORMAL LOW (ref 5–15)
BUN: 9 mg/dL (ref 6–20)
CO2: 26 mmol/L (ref 22–32)
Calcium: 8 mg/dL — ABNORMAL LOW (ref 8.9–10.3)
Chloride: 110 mmol/L (ref 98–111)
Creatinine, Ser: 0.62 mg/dL (ref 0.44–1.00)
GFR, Estimated: >60 mL/min (ref >60)
Glucose, Bld: 82 mg/dL (ref 70–99)
Potassium: 3.8 mmol/L (ref 3.5–5.1)
Sodium: 139 mmol/L (ref 135–145)

## 2022-11-26 SURGERY — ESOPHAGOGASTRODUODENOSCOPY (EGD) WITH PROPOFOL
Anesthesia: General

## 2022-11-26 MED ORDER — PROPOFOL 500 MG/50ML IV EMUL
INTRAVENOUS | Status: DC | PRN
  Administered 2022-11-26: 200 ug/kg/min via INTRAVENOUS

## 2022-11-26 MED ORDER — PANTOPRAZOLE SODIUM 40 MG PO TBEC: 40.0000 mg | DELAYED_RELEASE_TABLET | Freq: Two times a day (BID) | ORAL | 0 refills | Status: AC

## 2022-11-26 MED ORDER — HYDROCODONE-ACETAMINOPHEN 5-325 MG PO TABS: 1.0000 | ORAL_TABLET | Freq: Four times a day (QID) | ORAL | 0 refills | Status: DC | PRN

## 2022-11-26 MED ORDER — HYOSCYAMINE SULFATE 0.125 MG/ML PO SOLN: 0.1250 mg | ORAL | Status: DC | PRN

## 2022-11-26 MED ORDER — ERYTHROMYCIN BASE 250 MG PO TBEC: 250.0000 mg | DELAYED_RELEASE_TABLET | Freq: Three times a day (TID) | ORAL | 0 refills | Status: AC

## 2022-11-26 MED ORDER — LIDOCAINE HCL (CARDIAC) PF 100 MG/5ML IV SOSY
PREFILLED_SYRINGE | INTRAVENOUS | Status: DC | PRN
  Administered 2022-11-26: 100 mg via INTRAVENOUS

## 2022-11-26 MED ORDER — PROPOFOL 10 MG/ML IV BOLUS
INTRAVENOUS | Status: DC | PRN
  Administered 2022-11-26: 50 mg via INTRAVENOUS
  Administered 2022-11-26: 100 mg via INTRAVENOUS

## 2022-11-26 MED ORDER — HYDROCODONE-ACETAMINOPHEN 5-325 MG PO TABS
1.0000 | ORAL_TABLET | ORAL | Status: DC | PRN
  Administered 2022-11-26: 1 via ORAL
  Filled 2022-11-26: qty 1

## 2022-11-26 NOTE — Plan of Care (Signed)
IV removed, discharge instructions reviewed and patient discharged to home with husband

## 2022-11-26 NOTE — Discharge Summary (Signed)
Physician Discharge Summary   Patient: Maria Fuller MRN: [id number]  DOB: 09/09/1985   Admit:     Date of Admission: 11/23/2022 Admitted from: home   Discharge: Date of discharge: 11/26/22 Disposition: Home Condition at discharge: good  CODE STATUS: FULL CODE     Discharge Physician: Sunnie Nielsen, DO [facility]     PCP: Gardiner Coins, PA-C  Recommendations for Outpatient Follow-up:  Follow up with PCP Gardiner Coins, PA-C in 2-4 weeks, ensure follow up with gastroenterology in 4-6 weeks Please follow up on the following pending results: biopsy results from EGD    Discharge Instructions     Call MD for:  persistant nausea and vomiting   Complete by: As directed    Call MD for:  severe uncontrolled pain   Complete by: As directed    Diet general   Complete by: As directed    Increase activity slowly   Complete by: As directed          Discharge Diagnoses: Principal Problem:   Food bolus obstruction of intestine (HCC) Active Problems:   Status post bariatric surgery   History of diabetes mellitus, type II   Depression   Epigastric pain       Hospital Course: Maria Fuller is a 37 y.o. female with medical history significant of s/p gastric bypass, ventral hernias complicated by bowel obstruction s/p repair, previous type 2 diabetes and hypertension no longer requiring treatment after bypass, depression/anxiety, who presents to the ED due to abdominal pain.  07/21: To ED, CT abdomen demonstrated new dilation of gastric cardia concerning for bolus obstruction, admitted to hospitalist service, EGD as below. 07/22-07/23: Awaiting clearance of food bolus, planning repeat EGD 07/24 07/24: No significant concerning findings on EGD for evaluation of stomach anatomy. 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 Chronic nausea S/p EGD as above  Potentially late complication of previous gastric bypass Switch from Reglan to erythromycin 500 mg 3 times daily Protonix 40 mg p.o. or IV twice daily Avoid opiates CLD as tolerated, did well w/ advance today following EGD  Depression Continue home regimen   Status post bariatric surgery Resume home multivitamin after endoscopy  History of diabetes mellitus, type II Resolved after gastric bypass.   No longer requiring any medications. Follow outpatient            Discharge Instructions  Allergies as of 11/26/2022       Reactions   Compazine [prochlorperazine] Other (See Comments)   Panic attack   Ceftin [cefuroxime Axetil] Rash   Tolerated cefazolin before   Penicillins Rash   TOLERATED CEFAZOLIN BEFORE        Medication List     TAKE these medications    buPROPion 150 MG 24 hr tablet Commonly known as: WELLBUTRIN XL Take 150 mg by mouth every morning.   dicyclomine 10 MG capsule Commonly known as: BENTYL Take 1 capsule (10 mg total) by mouth 4 (four) times daily -  before meals and at bedtime.   erythromycin 250 MG EC tablet Commonly known as:  ERY-TAB Take 1 tablet (250 mg total) by mouth 3 (three) times daily for 14 days.   HYDROcodone-acetaminophen 5-325 MG tablet Commonly known as: Norco Take 1-2 tablets by mouth every 6 (six) hours as needed for moderate pain or severe pain. What changed: reasons to take this   hyoscyamine 0.125 MG/ML solution Commonly known as: LEVSIN Take 0.125 mg by mouth every 4 (four) hours as needed for cramping.   levonorgestrel 20 MCG/24HR  IUD Commonly known as: MIRENA 1 each by Intrauterine route once.   ondansetron 4 MG disintegrating tablet Commonly known as: ZOFRAN-ODT Take 1 tablet (4 mg total) by mouth every 8 (eight) hours as needed for nausea or vomiting.   pantoprazole 40 MG tablet Commonly known as: PROTONIX Take 1 tablet (40 mg total) by mouth 2 (two) times daily before a meal. What changed: when to take this   scopolamine 1 MG/3DAYS Commonly known as: TRANSDERM-SCOP 1 patch every three (3) days as needed.   sertraline 100 MG tablet Commonly known as: ZOLOFT Take 100 mg by mouth 2 (two) times daily.   traZODone 50 MG tablet Commonly known as: DESYREL Take 50 mg by mouth at bedtime.          Allergies  Allergen Reactions   Compazine [Prochlorperazine] Other (See Comments)    Panic attack   Ceftin [Cefuroxime Axetil] Rash    Tolerated cefazolin before   Penicillins Rash    TOLERATED CEFAZOLIN BEFORE      Subjective: pt reports tolerating full liquid diet following EGD today. Still having some discomfort in lower abdomen described as cramping type pain, hasn't had BM in awhile, no nausea    Discharge Exam: BP 105/68   Pulse (!) 54   Temp 97.9 F (36.6 C) (Oral)   Resp 16   Ht 6\' 1"  (1.854 m)   Wt 106.6 kg   SpO2 96%   BMI 31.00 kg/m  General: Pt is alert, awake, not in acute distress Cardiovascular: RRR, S1/S2 +, no rubs, no gallops Respiratory: CTA bilaterally, no wheezing, no rhonchi Abdominal: Soft, NT, ND, bowel sounds hypoactive  Extremities: no edema, no cyanosis     The results of significant diagnostics from this hospitalization (including imaging, microbiology, ancillary and laboratory) are listed below for reference.     Microbiology: No results found for this or any previous visit (from the past 240 hour(s)).   Labs: BNP (last 3 results) No results for input(s): "BNP" in the last 8760 hours. Basic Metabolic Panel: Recent Labs  Lab 11/23/22 0850  11/25/22 0616 11/26/22 0305  NA 137 138 139  K 3.5 3.3* 3.8  CL 110 107 110  CO2 21* 25 26  GLUCOSE 86 85 82  BUN 10 15 9   CREATININE 0.65 0.66 0.62  CALCIUM 8.0* 8.3* 8.0*   Liver Function Tests: Recent Labs  Lab 11/23/22 0850  AST 16  ALT 21  ALKPHOS 69  BILITOT 0.7  PROT 7.1  ALBUMIN 4.2   Recent Labs  Lab 11/23/22 0850  LIPASE 24   No results for input(s): "AMMONIA" in the last 168 hours. CBC: Recent Labs  Lab 11/23/22 0850 11/24/22 0515 11/25/22 0616 11/26/22 0305  WBC 5.7 8.3 7.8 4.5  NEUTROABS  --   --  4.2 1.9  HGB 10.9* 10.5* 10.7* 9.6*  HCT 33.1* 31.5* 31.3* 29.4*  MCV 95.1 92.6 94.6 96.7  PLT 298 305 296 270   Cardiac Enzymes: No results for input(s): "CKTOTAL", "CKMB", "CKMBINDEX", "TROPONINI" in the last 168  hours. BNP: Invalid input(s): "POCBNP" CBG: Recent Labs  Lab 11/24/22 1155  GLUCAP 78   D-Dimer No results for input(s): "DDIMER" in the last 72 hours. Hgb A1c No results for input(s): "HGBA1C" in the last 72 hours. Lipid Profile No results for input(s): "CHOL", "HDL", "LDLCALC", "TRIG", "CHOLHDL", "LDLDIRECT" in the last 72 hours. Thyroid function studies No results for input(s): "TSH", "T4TOTAL", "T3FREE", "THYROIDAB" in the last 72 hours.  Invalid input(s): "FREET3" Anemia work up No results for input(s): "VITAMINB12", "FOLATE", "FERRITIN", "TIBC", "IRON", "RETICCTPCT" in the last 72 hours. Urinalysis    Component Value Date/Time   COLORURINE YELLOW (A) 11/23/2022 0855   APPEARANCEUR CLEAR (A) 11/23/2022 0855   APPEARANCEUR Clear 05/29/2022 1503   LABSPEC 1.005 11/23/2022 0855   LABSPEC 1.033 02/08/2013 0027   PHURINE 6.0 11/23/2022 0855   GLUCOSEU NEGATIVE 11/23/2022 0855   GLUCOSEU >=500 02/08/2013 0027   HGBUR NEGATIVE 11/23/2022 0855   BILIRUBINUR NEGATIVE 11/23/2022 0855   BILIRUBINUR Negative 05/29/2022 1503   BILIRUBINUR Negative 02/08/2013 0027   KETONESUR NEGATIVE 11/23/2022 0855   PROTEINUR NEGATIVE  11/23/2022 0855   UROBILINOGEN 1.0 06/26/2014 1405   NITRITE NEGATIVE 11/23/2022 0855   LEUKOCYTESUR NEGATIVE 11/23/2022 0855   LEUKOCYTESUR Negative 02/08/2013 0027   Sepsis Labs Recent Labs  Lab 11/23/22 0850 11/24/22 0515 11/25/22 0616 11/26/22 0305  WBC 5.7 8.3 7.8 4.5   Microbiology No results found for this or any previous visit (from the past 240 hour(s)). Imaging CT ABDOMEN PELVIS W CONTRAST  Result Date: 11/23/2022 CLINICAL DATA:  Bowel obstruction suspected. EXAM: CT ABDOMEN AND PELVIS WITH CONTRAST TECHNIQUE: Multidetector CT imaging of the abdomen and pelvis was performed using the standard protocol following bolus administration of intravenous contrast. RADIATION DOSE REDUCTION: This exam was performed according to the departmental dose-optimization program which includes automated exposure control, adjustment of the mA and/or kV according to patient size and/or use of iterative reconstruction technique. CONTRAST:  OMNIPAQUE IOHEXOL 300 MG/ML  SOLN COMPARISON:  CT abdomen/pelvis 08/17/2022. FINDINGS: Lower chest: No acute abnormality. Hepatobiliary: No focal liver abnormality is seen. Status post cholecystectomy. No biliary dilatation. Pancreas: Unremarkable. No pancreatic ductal dilatation or surrounding inflammatory changes. Spleen: Normal. Adrenals/Urinary Tract: Adrenal glands are unremarkable. Kidneys are normal, without renal calculi, focal lesion, or hydronephrosis. Bladder is unremarkable. Stomach/Bowel: Postoperative changes of sleeve gastrectomy. New dilation of the gastric cardia with retained food bolus. Enteric contrast is seen around the periphery of the food bolus and in the distal small bowel and proximal colon. No dilated loops of small bowel. Postoperative changes of partial bowel resection. Colon is otherwise unremarkable. No wall thickening or surrounding inflammation. Vascular/Lymphatic: No significant vascular findings are present. No enlarged abdominal or  pelvic lymph nodes. Reproductive: IUD in place.  No adnexal masses. Other: No abdominal wall hernia or abnormality. No abdominopelvic ascites. 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 IMPRESSION: Postoperative changes of prior sleeve gastrectomy. New dilation of the gastric cardia with retained food bolus, concerning for food bolus obstruction. Enteric contrast is seen around the periphery of the food bolus and in the distal small bowel and proximal colon. No evidence of small-bowel obstruction. Electronically Signed   By: Orvan Falconer M.D.   On: 11/23/2022 13:15      Time coordinating discharge: over 30 minutes  SIGNED:  Sunnie Nielsen DO [facility]

## 2022-11-26 NOTE — Anesthesia Postprocedure Evaluation (Signed)
Anesthesia Post Note  Patient: Ashyla Luth Fredin  Procedure(s) Performed: ESOPHAGOGASTRODUODENOSCOPY (EGD) WITH PROPOFOL BIOPSY  Patient location during evaluation: Endoscopy Anesthesia Type: General Level of consciousness: awake and alert Pain management: pain level controlled Vital Signs Assessment: post-procedure vital signs reviewed and stable Respiratory status: spontaneous breathing, nonlabored ventilation, respiratory function stable and patient connected to nasal cannula oxygen Cardiovascular status: blood pressure returned to baseline and stable Postop Assessment: no apparent nausea or vomiting Anesthetic complications: no  No notable events documented.   Last Vitals:  Vitals:   11/26/22 1312 11/26/22 1322  BP: 106/60 108/70  Pulse:    Resp:    Temp: 36.6 C   SpO2:      Last Pain:  Vitals:   11/26/22 1332  TempSrc:   PainSc: 0-No pain                 Stephanie Coup

## 2022-11-26 NOTE — Anesthesia Preprocedure Evaluation (Signed)
Anesthesia Evaluation  Patient identified by MRN, date of birth, ID band Patient awake    Reviewed: Allergy & Precautions, H&P , NPO status , Patient's Chart, lab work & pertinent test results, reviewed documented beta blocker date and time   History of Anesthesia Complications Negative for: history of anesthetic complications  Airway Mallampati: II  TM Distance: >3 FB Neck ROM: full    Dental  (+) Teeth Intact, Dental Advidsory Given   Pulmonary neg shortness of breath, neg sleep apnea, neg recent URI   Pulmonary exam normal        Cardiovascular Exercise Tolerance: Good hypertension, On Medications (-) angina (-) Past MI Normal cardiovascular exam Rhythm:regular Rate:Normal     Neuro/Psych  Headaches PSYCHIATRIC DISORDERS Anxiety Depression       GI/Hepatic Neg liver ROS,GERD  Medicated,,  Endo/Other  diabetes, Well ControlledHypothyroidism    Renal/GU      Musculoskeletal   Abdominal   Peds  Hematology  (+) Blood dyscrasia, anemia   Anesthesia Other Findings Past Medical History: No date: Anemia     Comment:  slight No date: Bradycardia No date: Depression No date: Diabetes mellitus without complication (HCC)     Comment:  resolved with weight loss from gastric bypass No date: Headache     Comment:  migraines. resolved since gastric bypass No date: High cholesterol     Comment:  resolved since weight loss surgery No date: History of kidney stones     Comment:  last week No date: Hypertension     Comment:  resolved since gastric bypass, severe pre-eclampsia with              G1 No date: Low BP 2015: Meningitis No date: PCOS (polycystic ovarian syndrome) No date: Preterm labor No date: Sleep apnea     Comment:  had gastric bypass surgery and now resolved No date: Syncope couple years ago: Vertigo Past Surgical History: No date: CESAREAN SECTION 05/07/2016: CESAREAN SECTION; N/A     Comment:   Procedure: CESAREAN SECTION;  Surgeon: Nadara Mustard,               MD;  Location: [facility];  Service: Obstetrics;                Laterality: N/A;  Time of birth: [birth year] Sex:               Female Weight: 3940 kg, 8 lb 11 oz No date: CHOLECYSTECTOMY 09/19/2016: CYSTOSCOPY WITH STENT PLACEMENT; Right     Comment:  Procedure: CYSTOSCOPY WITH STENT PLACEMENT;  Surgeon:               Hildred Laser, MD;  Location: [facility];  Service:               Urology;  Laterality: Right; 07/28/2019: FLEXIBLE SIGMOIDOSCOPY; N/A     Comment:  Procedure: FLEXIBLE SIGMOIDOSCOPY;  Surgeon: Toney Reil, MD;  Location: [facility];                Service: Endoscopy;  Laterality: N/A; 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 09/28/2018: LAPAROSCOPIC OVARIAN CYSTECTOMY; Right     Comment:  Procedure: LAPAROSCOPIC RIGHT OVARIAN CYSTECTOMY;                Surgeon: Nadara Mustard, MD;  Location: [facility];  Service: Gynecology;  Laterality: Right; 07/25/2021: LAPAROSCOPIC UNILATERAL SALPINGO OOPHERECTOMY; Right     Comment:  Procedure: LAPAROSCOPIC UNILATERAL SALPINGO               OOPHORECTOMY;  Surgeon: Nadara Mustard, MD;  Location:               [facility];  Service: Gynecology;  Laterality: Right; 04/07/2018: LEFT HEART CATH AND CORONARY ANGIOGRAPHY; Left     Comment:  Procedure: LEFT HEART CATH AND CORONARY ANGIOGRAPHY;                Surgeon: Alwyn Pea, MD;  Location: [facility]              [facility];  Service: Cardiovascular;  Laterality: Left; 07/2019: small bowel obstruction No date: TONSILLECTOMY 09/19/2016: URETEROSCOPY WITH HOLMIUM LASER LITHOTRIPSY; Right     Comment:  Procedure: URETEROSCOPY WITH HOLMIUM LASER LITHOTRIPSY;               Surgeon: Hildred Laser, MD;  Location: [facility];                Service: Urology;  Laterality: Right; 07/27/2021: XI ROBOTIC ASSISTED VENTRAL HERNIA; N/A     Comment:  Procedure: XI ROBOTIC  ASSISTED INCARCERATED INCISIONAL               HERNIA REPAIR;  Surgeon: Campbell Lerner, MD;  Location:              [facility];  Service: General;  Laterality: N/A;   Reproductive/Obstetrics negative OB ROS                             Anesthesia Physical Anesthesia Plan  ASA: 2 and emergent  Anesthesia Plan: General   Post-op Pain Management: Minimal or no pain anticipated   Induction: Intravenous  PONV Risk Score and Plan: 3 and Propofol infusion, TIVA and Ondansetron  Airway Management Planned: Nasal Cannula  Additional Equipment: None  Intra-op Plan:   Post-operative Plan: Extubation in OR  Informed Consent: I have reviewed the patients History and Physical, chart, labs and discussed the procedure including the risks, benefits and alternatives for the proposed anesthesia with the patient or authorized representative who has indicated his/her understanding and acceptance.     Dental advisory given  Plan Discussed with: CRNA and Surgeon  Anesthesia Plan Comments: (Discussed risks of anesthesia with patient, including possibility of difficulty with spontaneous ventilation under anesthesia necessitating airway intervention, PONV, and rare risks such as cardiac or respiratory or neurological events, and allergic reactions. Discussed the role of CRNA in patient's perioperative care. Patient understands.)       Anesthesia Quick Evaluation

## 2022-11-26 NOTE — Transfer of Care (Signed)
Immediate Anesthesia Transfer of Care Note  Patient: Maria Fuller  Procedure(s) Performed: ESOPHAGOGASTRODUODENOSCOPY (EGD) WITH PROPOFOL BIOPSY  Patient Location: PACU  Anesthesia Type:General  Level of Consciousness: awake  Airway & Oxygen Therapy: Patient Spontanous Breathing  Post-op Assessment: Report given to RN and Post -op Vital signs reviewed and stable  Post vital signs: Reviewed and stable  Last Vitals:  Vitals Value Taken Time  BP 106/60 11/26/22 1313  Temp 36.6 C 11/26/22 1312  Pulse 49 11/26/22 1313  Resp 12 11/26/22 1313  SpO2 98 % 11/26/22 1313  Vitals shown include unfiled device data.  Last Pain:  Vitals:   11/26/22 1312  TempSrc: Temporal  PainSc:       Patients Stated Pain Goal: 3 (11/24/22 1706)  Complications: No notable events documented.

## 2022-11-26 NOTE — Hospital Course (Addendum)
Maria Fuller is a 37 y.o. female with medical history significant of s/p gastric bypass, ventral hernias complicated by bowel obstruction s/p repair, previous type 2 diabetes and hypertension no longer requiring treatment after bypass, depression/anxiety, who presents to the ED due to abdominal pain.  07/21: To ED, CT abdomen demonstrated new dilation of gastric cardia concerning for bolus obstruction, admitted to hospitalist service, EGD as below. 07/22-07/23: Awaiting clearance of food bolus, planning repeat EGD 07/24 07/24: No significant concerning findings on EGD for evaluation of stomach anatomy. 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 Chronic nausea S/p EGD as above  Potentially late complication of previous gastric bypass Switch from Reglan to erythromycin 500 mg 3 times daily Protonix 40 mg p.o. or IV twice daily Avoid opiates CLD as tolerated, did well w/ advance today following EGD  Depression Continue home regimen   Status post bariatric surgery Resume home multivitamin after endoscopy  History of diabetes mellitus, type II Resolved after gastric bypass.   No longer requiring any medications. Follow outpatient

## 2022-11-26 NOTE — Plan of Care (Signed)
  Problem: Nutrition: Goal: Adequate nutrition will be maintained Outcome: Progressing   Problem: Activity: Goal: Risk for activity intolerance will decrease Outcome: Progressing   Problem: Safety: Goal: Ability to remain free from injury will improve Outcome: Progressing   Problem: Education: Goal: Knowledge of General Education information will improve Description: Including pain rating scale, medication(s)/side effects and non-pharmacologic comfort measures Outcome: Progressing

## 2022-11-26 NOTE — Op Note (Signed)
[facility] Patient Name: Maria Fuller Procedure Date: 11/26/2022 12:45 PM MRN: [id number] Account #: [id number] Date of Birth: 04/09/1986 Admit Type: Inpatient Age: 37 Room: [facility] ENDO ROOM 4 Gender: Female Note Status: Finalized Instrument Name: Upper Endoscope [id number] Procedure:             Upper GI endoscopy Indications:           Epigastric abdominal pain, Dyspepsia Providers:             Toney Reil MD, MD Referring MD:          No Local Md, MD (Referring MD) Medicines:             See the Anesthesia note for documentation of the                         administered medications, General Anesthesia Complications:         No immediate complications. Estimated blood loss: None. Procedure:             Pre-Anesthesia Assessment:                        - Prior to the procedure, a History and Physical was                         performed, and patient medications and allergies were                         reviewed. The patient is competent. The risks and                         benefits of the procedure and the sedation options and                         risks were discussed with the patient. All questions                         were answered and informed consent was obtained.                         Patient identification and proposed procedure were                         verified by the physician, the nurse, the                         anesthesiologist, the anesthetist and the technician                         in the pre-procedure area in the procedure room in the                         endoscopy suite. Mental Status Examination: alert and                         oriented. Airway Examination: normal oropharyngeal                         airway and neck mobility. Respiratory Examination:  clear to auscultation. CV Examination: normal.                         Prophylactic Antibiotics: The patient does not require                          prophylactic antibiotics. Prior Anticoagulants: The                         patient has taken no anticoagulant or antiplatelet                         agents. ASA Grade Assessment: II - A patient with mild                         systemic disease. After reviewing the risks and                         benefits, the patient was deemed in satisfactory                         condition to undergo the procedure. The anesthesia                         plan was to use general anesthesia. Immediately prior                         to administration of medications, the patient was                         re-assessed for adequacy to receive sedatives. The                         heart rate, respiratory rate, oxygen saturations,                         blood pressure, adequacy of pulmonary ventilation, and                         response to care were monitored throughout the                         procedure. The physical status of the patient was                         re-assessed after the procedure.                        After obtaining informed consent, the endoscope was                         passed under direct vision. Throughout the procedure,                         the patient's blood pressure, pulse, and oxygen                         saturations were monitored continuously. The Endoscope  was introduced through the mouth, and advanced to the                         jejunum. The upper GI endoscopy was accomplished                         without difficulty. The patient tolerated the                         procedure well. 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 A gastric       pouch with a normal size was found. The alimemtary limb was       characterized by healthy appearing mucosa. Jejunum was normal. Biopsies       were taken with a cold forceps for histology from gastric pouch for       erythematous  mucosa.      The gastroesophageal junction and examined esophagus were normal. 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                        - Normal esophagus.                        - No specimens collected. Recommendation:        - Await pathology results.                        - Return patient to hospital ward for possible                         discharge same day.                        - Advance diet as tolerated today.                        - Continue present medications. Procedure Code(s):     --- Professional ---                        [phone], Esophagogastroduodenoscopy, flexible,                         transoral; with biopsy, single or multiple Diagnosis Code(s):     --- Professional ---                        Z98.84, Bariatric surgery status                        R10.13, Epigastric pain CPT copyright 2022 [employer] Association. All rights reserved. The codes documented in this report are preliminary and upon coder review may  be revised to meet current compliance requirements. Dr. Libby Maw Toney Reil MD, MD 11/26/2022 1:15:21 PM This report has been signed electronically. Number of Addenda: 0 Note Initiated On: 11/26/2022 12:45 PM Estimated  Blood Loss:  Estimated blood loss: none. Estimated blood loss: none.      [facility]

## 2022-11-26 NOTE — TOC CM/SW Note (Signed)
Transition of Care [facility]) - Inpatient Brief Assessment   Patient Details  Name: Maria Fuller MRN: [id number] Date of Birth: 02/13/1986  Transition of Care [facility]) CM/SW Contact:    Margarito Liner, LCSW Phone Number: 11/26/2022, 9:33 AM   Clinical Narrative: CSW reviewed chart. No TOC needs identified at this time. CSW will continue to follow progress. Please place [facility] consult if any needs arise.  Transition of Care Asessment: Insurance and Status: Insurance coverage has been reviewed Patient has primary care physician: Yes Home environment has been reviewed: Single family home Prior level of function:: Not documented Prior/Current Home Services: No current home services Social Determinants of Health Reivew: SDOH reviewed no interventions necessary Readmission risk has been reviewed: Yes Transition of care needs: no transition of care needs at this time

## 2022-11-27 ENCOUNTER — Encounter: Payer: Self-pay | Admitting: Gastroenterology

## 2022-12-17 ENCOUNTER — Encounter: Payer: Self-pay | Admitting: Surgery

## 2022-12-18 ENCOUNTER — Encounter: Payer: Self-pay | Admitting: Surgery

## 2022-12-18 ENCOUNTER — Ambulatory Visit (INDEPENDENT_AMBULATORY_CARE_PROVIDER_SITE_OTHER): Payer: BC Managed Care – PPO | Admitting: Surgery

## 2022-12-18 VITALS — BP 124/66 | HR 62 | Temp 98.2°F | Ht 73.0 in | Wt 238.0 lb

## 2022-12-18 DIAGNOSIS — K432 Incisional hernia without obstruction or gangrene: Secondary | ICD-10-CM

## 2022-12-18 DIAGNOSIS — Z09 Encounter for follow-up examination after completed treatment for conditions other than malignant neoplasm: Secondary | ICD-10-CM | POA: Diagnosis not present

## 2022-12-18 DIAGNOSIS — R1031 Right lower quadrant pain: Secondary | ICD-10-CM | POA: Diagnosis not present

## 2022-12-18 DIAGNOSIS — G8929 Other chronic pain: Secondary | ICD-10-CM

## 2022-12-18 NOTE — Patient Instructions (Signed)
May try topical pain patches like Salon paws.  Follow up with gastroenterology.    Follow-up with our office as needed.  Please call and ask to speak with a nurse if you develop questions or concerns.   Call us for a follow up in 2 months if still having the pain.

## 2022-12-18 NOTE — Progress Notes (Signed)
[facility] POST-OP OFFICE VISIT  12/18/2022  HPI: Maria Fuller is a 37 y.o. female was recently hospitalized for food bolus likely caused from the pot roast.  She underwent EGD evaluations x 2.  Unanticipated return of prior soreness of the right lower quadrant that seem to come while hospitalized.  At work she does a fair amount of bending and this exacerbates the groin right lower quadrant area discomfort, and she is anticipating returning soon and hopes further evaluation or other strategies and pain management might be helpful.  No known resumption of bulge, avoiding narcotics and oral NSAIDs.  Vital signs: BP 124/66   Pulse 62   Temp 98.2 F (36.8 C)   Ht 6\' 1"  (1.854 m)   Wt 238 lb (108 kg)   SpO2 98%   BMI 31.40 kg/m    Physical Exam: Constitutional: She appears at her baseline. Abdomen: Soft nontender, without masses or bulge.  She does have some subjective tenderness around the right lower quadrant abdominal wall on palpation. Skin: Incisions are healed.  Assessment/Plan: This is a 37 y.o. female s/p robotic RLQ incisional hernia repair with mesh.   Patient Active Problem List   Diagnosis Date Noted   Epigastric pain 11/24/2022   Food bolus obstruction of intestine (HCC) 11/23/2022   Depression 11/23/2022   Bradycardia 04/19/2022   Left ureteral stone 04/18/2022   Febrile urinary tract infection 04/18/2022   Major depressive disorder, recurrent, moderate (HCC) 11/16/2020   Internal hernia 08/04/2019   Right ovarian cyst 09/09/2018   Secondary oligomenorrhea 05/25/2018   Rectal pain 05/25/2018   PCOS (polycystic ovarian syndrome) 05/25/2018   Menometrorrhagia 05/25/2018   Hydronephrosis 09/22/2016   Left renal stone 09/22/2016   Left flank pain 12/11/2015   Right flank pain 10/06/2015   Status post bariatric surgery 09/13/2015   History of hypertension 08/30/2015   History of PCOS 08/30/2015   Vitamin D deficiency 04/20/2015   Diabetes  mellitus (HCC) 03/27/2015   Dysmenorrhea 05/25/2014   Rectal bleeding 05/25/2014   Contraception 04/07/2014   Dysfunction of both eustachian tubes 04/07/2014   Migraines 04/07/2014   Hypothyroidism 07/22/2013   Major depressive disorder, single episode 07/22/2013   HTN (hypertension) 07/15/2013   Headache 07/10/2013   Anxiety state 03/05/2013   Morbid obesity with BMI of 50.0-59.9, adult (HCC) 03/05/2013   Preventative health care 03/05/2013   Morbid obesity (HCC) 03/05/2013   History of diabetes mellitus, type II 03/04/2013   Diabetes mellitus type 2, uncontrolled 03/04/2013   Obstructive sleep apnea 09/14/2012   Hyperlipidemia 07/28/2012    -We discussed trial of topical pain relief patches i.e. Salonpas or lidocaine, considering that this will be less risk than an oral NSAID considering her previous gastric sleeve.  Will be glad to see her again should anything unexpected/unanticipated occur, will anticipate follow-up in a few months.    Campbell Lerner M.D., FACS 12/18/2022, 1:30 PM

## 2022-12-30 ENCOUNTER — Encounter: Payer: Self-pay | Admitting: Urology

## 2022-12-30 DIAGNOSIS — N200 Calculus of kidney: Secondary | ICD-10-CM

## 2022-12-31 ENCOUNTER — Ambulatory Visit: Payer: BC Managed Care – PPO | Admitting: Urology

## 2023-03-04 ENCOUNTER — Encounter: Payer: Self-pay | Admitting: *Deleted

## 2023-03-13 ENCOUNTER — Emergency Department: Payer: BC Managed Care – PPO

## 2023-03-13 ENCOUNTER — Emergency Department
Admission: EM | Admit: 2023-03-13 | Discharge: 2023-03-14 | Disposition: A | Discharge status: Home / Self Care | Payer: BC Managed Care – PPO | Attending: Emergency Medicine | Admitting: Emergency Medicine

## 2023-03-13 ENCOUNTER — Other Ambulatory Visit: Payer: Self-pay

## 2023-03-13 DIAGNOSIS — R1032 Left lower quadrant pain: Secondary | ICD-10-CM | POA: Diagnosis present

## 2023-03-13 DIAGNOSIS — R110 Nausea: Secondary | ICD-10-CM | POA: Diagnosis not present

## 2023-03-13 DIAGNOSIS — N83202 Unspecified ovarian cyst, left side: Secondary | ICD-10-CM

## 2023-03-13 LAB — CBC
HCT: 32.1 % — ABNORMAL LOW (ref 36.0–46.0)
Hemoglobin: 10.5 g/dL — ABNORMAL LOW (ref 12.0–15.0)
MCH: 31.3 pg (ref 26.0–34.0)
MCHC: 32.7 g/dL (ref 30.0–36.0)
MCV: 95.8 fL (ref 80.0–100.0)
Platelets: 291 10*3/uL (ref 150–400)
RBC: 3.35 MIL/uL — ABNORMAL LOW (ref 3.87–5.11)
RDW: 12.9 % (ref 11.5–15.5)
WBC: 7.7 10*3/uL (ref 4.0–10.5)
nRBC: 0 % (ref 0.0–0.2)

## 2023-03-13 LAB — URINALYSIS, ROUTINE W REFLEX MICROSCOPIC
Bilirubin Urine: NEGATIVE
Glucose, UA: NEGATIVE mg/dL
Hgb urine dipstick: NEGATIVE
Ketones, ur: NEGATIVE mg/dL
Leukocytes,Ua: NEGATIVE
Nitrite: NEGATIVE
Protein, ur: NEGATIVE mg/dL
Specific Gravity, Urine: 1.018 (ref 1.005–1.030)
pH: 5 (ref 5.0–8.0)

## 2023-03-13 LAB — LIPASE, BLOOD: Lipase: 22 U/L (ref 11–51)

## 2023-03-13 LAB — POC URINE PREG, ED: Preg Test, Ur: NEGATIVE

## 2023-03-13 LAB — COMPREHENSIVE METABOLIC PANEL
ALT: 19 U/L (ref 0–44)
AST: 13 U/L — ABNORMAL LOW (ref 15–41)
Albumin: 4.1 g/dL (ref 3.5–5.0)
Alkaline Phosphatase: 84 U/L (ref 38–126)
Anion gap: 7 (ref 5–15)
BUN: 11 mg/dL (ref 6–20)
CO2: 22 mmol/L (ref 22–32)
Calcium: 8.1 mg/dL — ABNORMAL LOW (ref 8.9–10.3)
Chloride: 108 mmol/L (ref 98–111)
Creatinine, Ser: 0.52 mg/dL (ref 0.44–1.00)
GFR, Estimated: >60 mL/min (ref >60)
Glucose, Bld: 88 mg/dL (ref 70–99)
Potassium: 3.4 mmol/L — ABNORMAL LOW (ref 3.5–5.1)
Sodium: 137 mmol/L (ref 135–145)
Total Bilirubin: 0.4 mg/dL (ref <1.2)
Total Protein: 6.6 g/dL (ref 6.5–8.1)

## 2023-03-13 MED ORDER — OXYCODONE HCL 5 MG PO TABS
5.0000 mg | ORAL_TABLET | Freq: Once | ORAL | Status: AC
  Administered 2023-03-13: 5 mg via ORAL
  Filled 2023-03-13: qty 1

## 2023-03-13 NOTE — ED Triage Notes (Signed)
Pt comes with c/o belly pain on left side. Pt state this started today. Pt states nausea. Pt states she has hx ovarian cysts.

## 2023-03-13 NOTE — ED Triage Notes (Addendum)
Typed in error

## 2023-03-13 NOTE — ED Provider Notes (Signed)
[facility] Provider Note    Event Date/Time   First MD Initiated Contact with Patient 03/13/23 2215     (approximate)   History   Abdominal Pain   HPI  Maria Fuller is a 37 y.o. female with a history of kidney stones and ovarian cyst who comes in with concerns for left-sided abdominal pain.  Patient reports that she has had intermittent abdominal pain today with some associated nausea.  She reports taking nausea medicine prior to coming in.  She reports that she does not feel like it is her prior kidney stone and she is concerned about the cyst.  She reports having cyst on her right ovary and having to have a right ovary removed previously.  She reports a little bit of clearish vaginal discharge but reports being with her husband denies any new sexual partners.  Physical Exam   Triage Vital Signs: ED Triage Vitals  Encounter Vitals Group     BP 03/13/23 1717 (!) 148/86     Systolic BP Percentile --      Diastolic BP Percentile --      Pulse Rate 03/13/23 1717 (!) 55     Resp 03/13/23 1717 16     Temp 03/13/23 1717 98.2 F (36.8 C)     Temp Source 03/13/23 1717 Oral     SpO2 03/13/23 1717 100 %     Weight 03/13/23 1720 155 lb (70.3 kg)     Height 03/13/23 1720 5\' 4"  (1.626 m)     Head Circumference --      Peak Flow --      Pain Score 03/13/23 1721 4     Pain Loc --      Pain Education --      Exclude from Growth Chart --     Most recent vital signs: Vitals:   03/13/23 1717  BP: (!) 148/86  Pulse: (!) 55  Resp: 16  Temp: 98.2 F (36.8 C)  SpO2: 100%     General: Awake, no distress.  CV:  Good peripheral perfusion.  Resp:  Normal effort.  Abd:  No distention.  Slight tenderness in the left lower pelvic region.  No CVA tenderness Other:     ED Results / Procedures / Treatments   Labs (all labs ordered are listed, but only abnormal results are displayed) Labs Reviewed  COMPREHENSIVE METABOLIC PANEL - Abnormal; Notable for the  following components:      Result Value   Potassium 3.4 (*)    Calcium 8.1 (*)    AST 13 (*)    All other components within normal limits  CBC - Abnormal; Notable for the following components:   RBC 3.35 (*)    Hemoglobin 10.5 (*)    HCT 32.1 (*)    All other components within normal limits  URINALYSIS, ROUTINE W REFLEX MICROSCOPIC - Abnormal; Notable for the following components:   Color, Urine YELLOW (*)    APPearance CLEAR (*)    All other components within normal limits  LIPASE, BLOOD  POC URINE PREG, ED      RADIOLOGY Ultrasound pending  PROCEDURES:  Critical Care performed: No  Procedures   MEDICATIONS ORDERED IN ED: Medications  oxyCODONE (Oxy IR/ROXICODONE) immediate release tablet 5 mg (has no administration in time range)     IMPRESSION / MDM / ASSESSMENT AND PLAN / ED COURSE  I reviewed the triage vital signs and the nursing notes.   Patient's presentation is most  consistent with acute presentation with potential threat to life or bodily function.   Patient comes in with concerns for left lower quadrant pain with history of cyst my concern is ovarian cyst ovarian torsion we will get ultrasound to further evaluate.  Patient can self swab to rule out any type of STDs, yeast, bacterial vaginosis.  Labs were ordered pregnancy test was negative lipase normal CMP reassuring.  CBC shows stable hemoglobin.  Urine without evidence of UTI.  No evidence of RBCs to suggest kidney stone.  Patient was given a dose of oxycodone to help with pain.  Patient will be handed off pending ultrasound results and further workup and disposition     FINAL CLINICAL IMPRESSION(S) / ED DIAGNOSES   Final diagnoses:  LLQ pain     Rx / DC Orders   ED Discharge Orders     None        Note:  This document was prepared using Dragon voice recognition software and may include unintentional dictation errors.   Concha Se, MD 03/13/23 2223

## 2023-03-14 LAB — WET PREP, GENITAL
Clue Cells Wet Prep HPF POC: NONE SEEN
Sperm: NONE SEEN
Trich, Wet Prep: NONE SEEN
WBC, Wet Prep HPF POC: <10 (ref <10)
Yeast Wet Prep HPF POC: NONE SEEN

## 2023-03-14 LAB — CHLAMYDIA/NGC RT PCR (ARMC ONLY)
Chlamydia Tr: NOT DETECTED
N gonorrhoeae: NOT DETECTED

## 2023-03-14 MED ORDER — MORPHINE SULFATE (PF) 4 MG/ML IV SOLN: 4.0000 mg | Freq: Once | INTRAVENOUS | Status: DC

## 2023-03-14 MED ORDER — ONDANSETRON HCL 4 MG PO TABS: 4.0000 mg | ORAL_TABLET | Freq: Four times a day (QID) | ORAL | 0 refills | Status: AC | PRN

## 2023-03-14 MED ORDER — HYDROCODONE-ACETAMINOPHEN 5-325 MG PO TABS: 1.0000 | ORAL_TABLET | Freq: Four times a day (QID) | ORAL | 0 refills | Status: AC | PRN

## 2023-03-14 MED ORDER — OXYCODONE HCL 5 MG PO TABS
5.0000 mg | ORAL_TABLET | Freq: Once | ORAL | Status: AC
  Administered 2023-03-14: 5 mg via ORAL
  Filled 2023-03-14: qty 1

## 2023-03-14 MED ORDER — KETOROLAC TROMETHAMINE 15 MG/ML IJ SOLN
15.0000 mg | Freq: Once | INTRAMUSCULAR | Status: AC
Start: 2023-03-14 — End: 2023-03-14
  Administered 2023-03-14: 15 mg via INTRAMUSCULAR
  Filled 2023-03-14: qty 1

## 2023-03-14 NOTE — Discharge Instructions (Signed)
You were seen in the emergency room today for evaluation of your abdominal pain.  Your ultrasound did show that you have a cyst on your left side.  I like that this may be the cause of your pain.  Please arrange close follow-up with your OB/GYN for further evaluation of your cyst.  You can take Tylenol to help with your pain.  If you have breakthrough pain, I sent a short course of narcotic pain medicine to your pharmacy.  Do not drive or operate machinery when taking this.  I have also sent nausea medicine that you can take as needed.  Return to the ER for new or worsening symptoms.

## 2023-03-14 NOTE — ED Provider Notes (Signed)
Care of this patient assumed from prior physician at 2300 pending ultrasound, reevaluation, and disposition. Please see prior physician note for further details.  37 year old female who presented with left lower abdominal pain with associated nausea.  Has a history of right ovarian cyst s/p right oophorectomy.  Does feel somewhat similar to prior cyst pain.  Blood work overall reassuring.  GC and wet prep negative.  Signed out to me pending ultrasound result and disposition.  While awaiting ultrasound read, patient did have some recurrent pain for which she was ordered for oxycodone and Toradol. Patient's ultrasound did demonstrate a complex left-sided ovarian cyst measuring 2.3 x 1.3 x 1.9 cm.  Flow was noted to this area without evidence of torsion.  She was updated on the results of her ultrasound.  I did discuss further imaging such as CT to evaluate for alternative pathologies, but given location of pain and ultrasound findings, overall lower suspicion for this.  Patient is comfortable with holding off on further imaging.  She is additionally comfortable with discharge home with symptomatic treatment with nausea and pain meds and close follow-up with her OB/GYN for further evaluation.  Strict return precautions were provided.  Patient was discharged in stable condition.   Trinna Post, MD 03/14/23 [phone]

## 2023-03-16 ENCOUNTER — Encounter
Admission: RE | Disposition: A | Discharge status: Home / Self Care | Payer: Self-pay | Source: Home / Self Care | Attending: Gastroenterology

## 2023-03-16 ENCOUNTER — Ambulatory Visit: Payer: BC Managed Care – PPO | Admitting: General Practice

## 2023-03-16 ENCOUNTER — Other Ambulatory Visit: Payer: Self-pay

## 2023-03-16 ENCOUNTER — Ambulatory Visit
Admission: RE | Admit: 2023-03-16 | Discharge: 2023-03-16 | Disposition: A | Discharge status: Home / Self Care | Payer: BC Managed Care – PPO | Attending: Gastroenterology | Admitting: Gastroenterology

## 2023-03-16 DIAGNOSIS — E119 Type 2 diabetes mellitus without complications: Secondary | ICD-10-CM | POA: Diagnosis not present

## 2023-03-16 DIAGNOSIS — I10 Essential (primary) hypertension: Secondary | ICD-10-CM | POA: Diagnosis not present

## 2023-03-16 DIAGNOSIS — K219 Gastro-esophageal reflux disease without esophagitis: Secondary | ICD-10-CM | POA: Insufficient documentation

## 2023-03-16 DIAGNOSIS — K5140 Inflammatory polyps of colon without complications: Secondary | ICD-10-CM | POA: Insufficient documentation

## 2023-03-16 DIAGNOSIS — F418 Other specified anxiety disorders: Secondary | ICD-10-CM | POA: Diagnosis not present

## 2023-03-16 DIAGNOSIS — Z1211 Encounter for screening for malignant neoplasm of colon: Secondary | ICD-10-CM | POA: Diagnosis present

## 2023-03-16 DIAGNOSIS — Q438 Other specified congenital malformations of intestine: Secondary | ICD-10-CM | POA: Insufficient documentation

## 2023-03-16 DIAGNOSIS — K640 First degree hemorrhoids: Secondary | ICD-10-CM | POA: Diagnosis not present

## 2023-03-16 HISTORY — PX: COLONOSCOPY WITH PROPOFOL: SHX5780

## 2023-03-16 HISTORY — PX: POLYPECTOMY: SHX5525

## 2023-03-16 LAB — POCT PREGNANCY, URINE: Preg Test, Ur: NEGATIVE

## 2023-03-16 SURGERY — COLONOSCOPY WITH PROPOFOL
Anesthesia: General

## 2023-03-16 MED ORDER — MIDAZOLAM HCL 2 MG/2ML IJ SOLN
INTRAMUSCULAR | Status: AC
  Filled 2023-03-16: qty 2

## 2023-03-16 MED ORDER — MIDAZOLAM HCL 2 MG/2ML IJ SOLN
INTRAMUSCULAR | Status: DC | PRN
  Administered 2023-03-16: 2 mg via INTRAVENOUS

## 2023-03-16 MED ORDER — LIDOCAINE HCL (CARDIAC) PF 100 MG/5ML IV SOSY
PREFILLED_SYRINGE | INTRAVENOUS | Status: DC | PRN
  Administered 2023-03-16: 100 mg via INTRAVENOUS

## 2023-03-16 MED ORDER — PROPOFOL 10 MG/ML IV BOLUS
INTRAVENOUS | Status: DC | PRN
  Administered 2023-03-16: 30 mg via INTRAVENOUS
  Administered 2023-03-16: 70 mg via INTRAVENOUS

## 2023-03-16 MED ORDER — SODIUM CHLORIDE 0.9 % IV SOLN: INTRAVENOUS | Status: DC

## 2023-03-16 MED ORDER — PROPOFOL 1000 MG/100ML IV EMUL
INTRAVENOUS | Status: AC
  Filled 2023-03-16: qty 100

## 2023-03-16 MED ORDER — PROPOFOL 500 MG/50ML IV EMUL
INTRAVENOUS | Status: DC | PRN
  Administered 2023-03-16: 165 ug/kg/min via INTRAVENOUS

## 2023-03-16 MED ORDER — GLYCOPYRROLATE 0.2 MG/ML IJ SOLN
INTRAMUSCULAR | Status: DC | PRN
  Administered 2023-03-16: .2 mg via INTRAVENOUS

## 2023-03-16 NOTE — H&P (Signed)
Outpatient short stay form Pre-procedure 03/16/2023  Regis Bill, MD  Primary Physician: Gardiner Coins, PA-C  Reason for visit:  RLQ abdominal pain  History of present illness:    37 y/o lady with history of duodenal switch and adenomatous polyps on colonoscopy in 2016. Had colonoscopy last year with poor prep. No blood thinners. No family history of GI malignancies. Also with history of ventral hernia repairs.    Current Facility-Administered Medications:    0.9 %  sodium chloride infusion, , Intravenous, Continuous, Kriste Broman, Rossie Muskrat, MD, Last Rate: 20 mL/hr at 03/16/23 1150, New Bag at 03/16/23 1150  Medications Prior to Admission  Medication Sig Dispense Refill Last Dose   buPROPion (WELLBUTRIN XL) 150 MG 24 hr tablet Take 150 mg by mouth every morning.   03/16/2023 at 0400   dicyclomine (BENTYL) 10 MG capsule Take 1 capsule (10 mg total) by mouth 4 (four) times daily -  before meals and at bedtime. 30 capsule 0 03/15/2023   gabapentin (NEURONTIN) 300 MG capsule Take 300 mg by mouth 3 (three) times daily.   03/15/2023   HYDROcodone-acetaminophen (NORCO) 5-325 MG tablet Take 1-2 tablets by mouth every 6 (six) hours as needed for moderate pain or severe pain. 15 tablet 0 03/15/2023   HYDROcodone-acetaminophen (NORCO) 5-325 MG tablet Take 1 tablet by mouth every 6 (six) hours as needed for up to 5 days for moderate pain (pain score 4-6). 12 tablet 0 03/15/2023   hydrOXYzine (ATARAX) 25 MG tablet Take 1 tablet by mouth 3 (three) times daily as needed.   03/15/2023   hyoscyamine (LEVSIN) 0.125 MG/ML solution Take 0.125 mg by mouth every 4 (four) hours as needed for cramping.   03/15/2023   levonorgestrel (MIRENA) 20 MCG/24HR IUD 1 each by Intrauterine route once.   03/16/2023   ondansetron (ZOFRAN) 4 MG tablet Take 1 tablet (4 mg total) by mouth every 6 (six) hours as needed for up to 7 days for nausea or vomiting. 20 tablet 0 03/15/2023   ondansetron (ZOFRAN-ODT) 4 MG  disintegrating tablet Take 1 tablet (4 mg total) by mouth every 8 (eight) hours as needed for nausea or vomiting. 20 tablet 0 03/15/2023   pantoprazole (PROTONIX) 40 MG tablet Take 1 tablet (40 mg total) by mouth 2 (two) times daily before a meal. 60 tablet 0 03/16/2023 at 0400   sertraline (ZOLOFT) 100 MG tablet Take 100 mg by mouth 2 (two) times daily.   03/16/2023 at 0400   traZODone (DESYREL) 50 MG tablet Take 50 mg by mouth at bedtime.    03/15/2023   scopolamine (TRANSDERM-SCOP) 1 MG/3DAYS 1 patch every three (3) days as needed. (Patient not taking: Reported on 03/16/2023)   Not Taking     Allergies  Allergen Reactions   Compazine [Prochlorperazine] Other (See Comments)    Panic attack   Ceftin [Cefuroxime Axetil] Rash    Tolerated cefazolin before   Penicillins Rash    TOLERATED CEFAZOLIN BEFORE      Past Medical History:  Diagnosis Date   Anemia    slight   Bradycardia    Depression    Diabetes mellitus without complication (HCC)    resolved with weight loss from gastric bypass   Headache    migraines. resolved since gastric bypass   High cholesterol    resolved since weight loss surgery   History of kidney stones    last week   Hypertension    resolved since gastric bypass, severe pre-eclampsia with G1  Incarcerated incisional hernia 07/27/2021   Low BP    Meningitis 2015   Partial small bowel obstruction (HCC) 08/04/2019   PCOS (polycystic ovarian syndrome)    Preterm labor    Recurrent ventral incisional hernia    Sleep apnea    had gastric bypass surgery and now resolved   Syncope    Vertigo couple years ago    Review of systems:  Otherwise negative.    Physical Exam  Gen: Alert, oriented. Appears stated age.  HEENT: PERRLA. Lungs: No respiratory distress CV: RRR Abd: soft, benign, no masses Ext: No edema    Planned procedures: Proceed with colonoscopy. The patient understands the nature of the planned procedure, indications, risks,  alternatives and potential complications including but not limited to bleeding, infection, perforation, damage to internal organs and possible oversedation/side effects from anesthesia. The patient agrees and gives consent to proceed.  Please refer to procedure notes for findings, recommendations and patient disposition/instructions.     Regis Bill, MD [facility]

## 2023-03-16 NOTE — Transfer of Care (Signed)
Immediate Anesthesia Transfer of Care Note  Patient: Maria Fuller  Procedure(s) Performed: COLONOSCOPY WITH PROPOFOL POLYPECTOMY  Patient Location: Endoscopy Unit  Anesthesia Type:General  Level of Consciousness: awake, drowsy, and patient cooperative  Airway & Oxygen Therapy: Patient Spontanous Breathing and Patient connected to face mask oxygen  Post-op Assessment: Report given to RN and Post -op Vital signs reviewed and stable  Post vital signs: Reviewed and stable  Last Vitals:  Vitals Value Taken Time  BP 115/67 03/16/23 1255  Temp    Pulse 60 03/16/23 1257  Resp 21 03/16/23 1257  SpO2 100 % 03/16/23 1257  Vitals shown include unfiled device data.  Last Pain:  Vitals:   03/16/23 1147  TempSrc: Temporal  PainSc: 0-No pain         Complications: No notable events documented.

## 2023-03-16 NOTE — Anesthesia Postprocedure Evaluation (Signed)
Anesthesia Post Note  Patient: Maria Fuller  Procedure(s) Performed: COLONOSCOPY WITH PROPOFOL POLYPECTOMY  Patient location during evaluation: PACU Anesthesia Type: General Level of consciousness: awake and alert Pain management: pain level controlled Vital Signs Assessment: post-procedure vital signs reviewed and stable Respiratory status: spontaneous breathing, nonlabored ventilation, respiratory function stable and patient connected to nasal cannula oxygen Cardiovascular status: blood pressure returned to baseline and stable Postop Assessment: no apparent nausea or vomiting Anesthetic complications: no  No notable events documented.   Last Vitals:  Vitals:   03/16/23 1305 03/16/23 1315  BP: 139/74 127/80  Pulse: (!) 52 (!) 52  Resp: 18 14  Temp:    SpO2: 100% 100%    Last Pain:  Vitals:   03/16/23 1315  TempSrc:   PainSc: 0-No pain                 Stephanie Coup

## 2023-03-16 NOTE — Interval H&P Note (Signed)
History and Physical Interval Note:  03/16/2023 12:02 PM  Maria Fuller  has presented today for surgery, with the diagnosis of irregualr bowel habits, RLQ pain,hx of adenomatous polyp of colon.  The various methods of treatment have been discussed with the patient and family. After consideration of risks, benefits and other options for treatment, the patient has consented to  Procedure(s): COLONOSCOPY WITH PROPOFOL (N/A) as a surgical intervention.  The patient's history has been reviewed, patient examined, no change in status, stable for surgery.  I have reviewed the patient's chart and labs.  Questions were answered to the patient's satisfaction.     Regis Bill  Ok to proceed with colonoscopy

## 2023-03-16 NOTE — Op Note (Signed)
[facility] Patient Name: Maria Fuller Procedure Date: 03/16/2023 12:18 PM MRN: [id number] Account #: [id number] Date of Birth: [birth year] Admit Type: Outpatient Age: 37 Room: [facility] ENDO ROOM 3 Gender: Female Note Status: Supervisor Override Instrument Name: Prentice Docker [id number] Procedure:             Colonoscopy Indications:           High risk colon cancer surveillance: Personal history                         of colonic polyps, Abdominal pain in the right lower                         quadrant Providers:             Eather Colas MD, MD Referring MD:          No Local Md, MD (Referring MD) Medicines:             Monitored Anesthesia Care Complications:         No immediate complications. Estimated blood loss:                         Minimal. Procedure:             Pre-Anesthesia Assessment:                        - Prior to the procedure, a History and Physical was                         performed, and patient medications and allergies were                         reviewed. The patient is competent. The risks and                         benefits of the procedure and the sedation options and                         risks were discussed with the patient. All questions                         were answered and informed consent was obtained.                         Patient identification and proposed procedure were                         verified by the physician, the nurse, the                         anesthesiologist, the anesthetist and the technician                         in the endoscopy suite. Mental Status Examination:                         alert and oriented. Airway Examination: normal  oropharyngeal airway and neck mobility. Respiratory                         Examination: clear to auscultation. CV Examination:                         normal. Prophylactic Antibiotics: The patient does not                          require prophylactic antibiotics. Prior                         Anticoagulants: The patient has taken no anticoagulant                         or antiplatelet agents. ASA Grade Assessment: II - A                         patient with mild systemic disease. After reviewing                         the risks and benefits, the patient was deemed in                         satisfactory condition to undergo the procedure. The                         anesthesia plan was to use monitored anesthesia care                         (MAC). Immediately prior to administration of                         medications, the patient was re-assessed for adequacy                         to receive sedatives. The heart rate, respiratory                         rate, oxygen saturations, blood pressure, adequacy of                         pulmonary ventilation, and response to care were                         monitored throughout the procedure. The physical                         status of the patient was re-assessed after the                         procedure.                        After obtaining informed consent, the colonoscope was                         passed under direct vision. Throughout the procedure,  the patient's blood pressure, pulse, and oxygen                         saturations were monitored continuously. The                         Colonoscope was introduced through the anus and                         advanced to the the cecum, identified by appendiceal                         orifice and ileocecal valve. The colonoscopy was                         technically difficult and complex due to a redundant                         colon and significant looping. Successful completion                         of the procedure was aided by changing the patient to                         a supine position, changing the patient to a prone                         position and  applying abdominal pressure. The patient                         tolerated the procedure well. The quality of the bowel                         preparation was fair. The ileocecal valve, appendiceal                         orifice, and rectum were photographed. Findings:      The perianal and digital rectal examinations were normal.      A 7 mm polyp was found in the distal ascending colon. The polyp was       sessile. The polyp was removed with a cold snare. Resection and       retrieval were complete. Estimated blood loss was minimal.      The lumen of the colon (entire examined portion) was mildly dilated.      Internal hemorrhoids were found during retroflexion. The hemorrhoids       were Grade I (internal hemorrhoids that do not prolapse).      The exam was otherwise without abnormality on direct and retroflexion       views. Impression:            - Preparation of the colon was fair.                        - One 7 mm polyp in the distal ascending colon,                         removed with a cold snare. Resected and retrieved.                        -  Dilated in the entire examined colon.                        - Internal hemorrhoids.                        - The examination was otherwise normal on direct and                         retroflexion views. Recommendation:        - Discharge patient to home.                        - Resume previous diet.                        - Continue present medications.                        - Await pathology results.                        - Repeat colonoscopy in 3 years because the bowel                         preparation was suboptimal.                        - Return to referring physician as previously                         scheduled. Procedure Code(s):     --- Professional ---                        [phone], Colonoscopy, flexible; with removal of                         tumor(s), polyp(s), or other lesion(s) by snare                          technique Diagnosis Code(s):     --- Professional ---                        K64.0, First degree hemorrhoids                        D12.2, Benign neoplasm of ascending colon                        K59.39, Other megacolon                        R10.31, Right lower quadrant pain CPT copyright 2022 [employer]. All rights reserved. The codes documented in this report are preliminary and upon coder review may  be revised to meet current compliance requirements. Eather Colas MD, MD 03/16/2023 12:54:06 PM Number of Addenda: 0 Note Initiated On: 03/16/2023 12:18 PM Scope Withdrawal Time: 0 hours 7 minutes 19 seconds  Total Procedure Duration: 0 hours 31 minutes 52 seconds  Estimated Blood Loss:  Estimated blood loss was minimal.      [facility]

## 2023-03-16 NOTE — Anesthesia Preprocedure Evaluation (Signed)
Anesthesia Evaluation  Patient identified by MRN, date of birth, ID band Patient awake    Reviewed: Allergy & Precautions, H&P , NPO status , Patient's Chart, lab work & pertinent test results, reviewed documented beta blocker date and time   History of Anesthesia Complications Negative for: history of anesthetic complications  Airway Mallampati: II  TM Distance: >3 FB Neck ROM: full    Dental  (+) Teeth Intact, Dental Advidsory Given   Pulmonary neg shortness of breath, neg sleep apnea, neg recent URI   Pulmonary exam normal        Cardiovascular Exercise Tolerance: Good hypertension, On Medications (-) angina (-) Past MI Normal cardiovascular exam Rhythm:regular Rate:Normal     Neuro/Psych  Headaches PSYCHIATRIC DISORDERS Anxiety Depression       GI/Hepatic Neg liver ROS,GERD  Medicated,,  Endo/Other  diabetes, Well ControlledHypothyroidism    Renal/GU      Musculoskeletal   Abdominal   Peds  Hematology  (+) Blood dyscrasia, anemia   Anesthesia Other Findings Past Medical History: No date: Anemia     Comment:  slight No date: Bradycardia No date: Depression No date: Diabetes mellitus without complication (HCC)     Comment:  resolved with weight loss from gastric bypass No date: Headache     Comment:  migraines. resolved since gastric bypass No date: High cholesterol     Comment:  resolved since weight loss surgery No date: History of kidney stones     Comment:  last week No date: Hypertension     Comment:  resolved since gastric bypass, severe pre-eclampsia with              G1 No date: Low BP 2015: Meningitis No date: PCOS (polycystic ovarian syndrome) No date: Preterm labor No date: Sleep apnea     Comment:  had gastric bypass surgery and now resolved No date: Syncope couple years ago: Vertigo Past Surgical History: No date: CESAREAN SECTION 05/07/2016: CESAREAN SECTION; N/A     Comment:   Procedure: CESAREAN SECTION;  Surgeon: Nadara Mustard,               MD;  Location: [facility] ORS;  Service: Obstetrics;                Laterality: N/A;  Time of birth: [birth year] Sex:               Female Weight: 3940 kg, 8 lb 11 oz No date: CHOLECYSTECTOMY 09/19/2016: CYSTOSCOPY WITH STENT PLACEMENT; Right     Comment:  Procedure: CYSTOSCOPY WITH STENT PLACEMENT;  Surgeon:               Hildred Laser, MD;  Location: [facility] ORS;  Service:               Urology;  Laterality: Right; 07/28/2019: FLEXIBLE SIGMOIDOSCOPY; N/A     Comment:  Procedure: FLEXIBLE SIGMOIDOSCOPY;  Surgeon: Toney Reil, MD;  Location: [facility];                Service: Endoscopy;  Laterality: N/A; 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 09/28/2018: LAPAROSCOPIC OVARIAN CYSTECTOMY; Right     Comment:  Procedure: LAPAROSCOPIC RIGHT OVARIAN CYSTECTOMY;                Surgeon: Nadara Mustard, MD;  Location: [facility] ORS;  Service: Gynecology;  Laterality: Right; 07/25/2021: LAPAROSCOPIC UNILATERAL SALPINGO OOPHERECTOMY; Right     Comment:  Procedure: LAPAROSCOPIC UNILATERAL SALPINGO               OOPHORECTOMY;  Surgeon: Nadara Mustard, MD;  Location:               [facility] ORS;  Service: Gynecology;  Laterality: Right; 04/07/2018: LEFT HEART CATH AND CORONARY ANGIOGRAPHY; Left     Comment:  Procedure: LEFT HEART CATH AND CORONARY ANGIOGRAPHY;                Surgeon: Alwyn Pea, MD;  Location: [facility] INVASIVE              CV LAB;  Service: Cardiovascular;  Laterality: Left; 07/2019: small bowel obstruction No date: TONSILLECTOMY 09/19/2016: URETEROSCOPY WITH HOLMIUM LASER LITHOTRIPSY; Right     Comment:  Procedure: URETEROSCOPY WITH HOLMIUM LASER LITHOTRIPSY;               Surgeon: Hildred Laser, MD;  Location: [facility] ORS;                Service: Urology;  Laterality: Right; 07/27/2021: XI ROBOTIC ASSISTED VENTRAL HERNIA; N/A     Comment:  Procedure: XI ROBOTIC  ASSISTED INCARCERATED INCISIONAL               HERNIA REPAIR;  Surgeon: Campbell Lerner, MD;  Location:              [facility] ORS;  Service: General;  Laterality: N/A;   Reproductive/Obstetrics negative OB ROS                             Anesthesia Physical Anesthesia Plan  ASA: 2  Anesthesia Plan: General   Post-op Pain Management: Minimal or no pain anticipated   Induction: Intravenous  PONV Risk Score and Plan: 3 and Propofol infusion, TIVA and Ondansetron  Airway Management Planned: Nasal Cannula  Additional Equipment: None  Intra-op Plan:   Post-operative Plan:   Informed Consent: I have reviewed the patients History and Physical, chart, labs and discussed the procedure including the risks, benefits and alternatives for the proposed anesthesia with the patient or authorized representative who has indicated his/her understanding and acceptance.     Dental advisory given  Plan Discussed with: CRNA and Surgeon  Anesthesia Plan Comments: (Discussed risks of anesthesia with patient, including possibility of difficulty with spontaneous ventilation under anesthesia necessitating airway intervention, PONV, and rare risks such as cardiac or respiratory or neurological events, and allergic reactions. Discussed the role of CRNA in patient's perioperative care. Patient understands.)       Anesthesia Quick Evaluation

## 2023-03-16 NOTE — Anesthesia Procedure Notes (Signed)
Procedure Name: General with mask airway Date/Time: 03/16/2023 12:22 PM  Performed by: Mohammed Kindle, CRNAPre-anesthesia Checklist: Patient identified, Emergency Drugs available, Suction available and Patient being monitored Patient Re-evaluated:Patient Re-evaluated prior to induction Oxygen Delivery Method: Simple face mask Induction Type: IV induction Placement Confirmation: positive ETCO2, CO2 detector and breath sounds checked- equal and bilateral Dental Injury: Teeth and Oropharynx as per pre-operative assessment

## 2023-03-17 LAB — SURGICAL PATHOLOGY

## 2023-03-29 ENCOUNTER — Other Ambulatory Visit: Payer: Self-pay

## 2023-03-29 ENCOUNTER — Encounter: Payer: Self-pay | Admitting: Emergency Medicine

## 2023-03-29 ENCOUNTER — Emergency Department: Payer: BC Managed Care – PPO

## 2023-03-29 ENCOUNTER — Emergency Department
Admission: EM | Admit: 2023-03-29 | Discharge: 2023-03-29 | Disposition: A | Discharge status: Home / Self Care | Payer: BC Managed Care – PPO | Attending: Emergency Medicine | Admitting: Emergency Medicine

## 2023-03-29 DIAGNOSIS — R1010 Upper abdominal pain, unspecified: Secondary | ICD-10-CM | POA: Diagnosis present

## 2023-03-29 DIAGNOSIS — K561 Intussusception: Secondary | ICD-10-CM | POA: Insufficient documentation

## 2023-03-29 LAB — COMPREHENSIVE METABOLIC PANEL
ALT: 22 U/L (ref 0–44)
AST: 22 U/L (ref 15–41)
Albumin: 4.4 g/dL (ref 3.5–5.0)
Alkaline Phosphatase: 100 U/L (ref 38–126)
Anion gap: 7 (ref 5–15)
BUN: 12 mg/dL (ref 6–20)
CO2: 22 mmol/L (ref 22–32)
Calcium: 8 mg/dL — ABNORMAL LOW (ref 8.9–10.3)
Chloride: 107 mmol/L (ref 98–111)
Creatinine, Ser: 0.51 mg/dL (ref 0.44–1.00)
GFR, Estimated: >60 mL/min (ref >60)
Glucose, Bld: 86 mg/dL (ref 70–99)
Potassium: 3.5 mmol/L (ref 3.5–5.1)
Sodium: 136 mmol/L (ref 135–145)
Total Bilirubin: 0.5 mg/dL (ref <1.2)
Total Protein: 7.5 g/dL (ref 6.5–8.1)

## 2023-03-29 LAB — HCG, QUANTITATIVE, PREGNANCY: hCG, Beta Chain, Quant, S: <1 m[IU]/mL (ref <5)

## 2023-03-29 LAB — CBC
HCT: 34.1 % — ABNORMAL LOW (ref 36.0–46.0)
Hemoglobin: 11.1 g/dL — ABNORMAL LOW (ref 12.0–15.0)
MCH: 30.9 pg (ref 26.0–34.0)
MCHC: 32.6 g/dL (ref 30.0–36.0)
MCV: 95 fL (ref 80.0–100.0)
Platelets: 349 10*3/uL (ref 150–400)
RBC: 3.59 MIL/uL — ABNORMAL LOW (ref 3.87–5.11)
RDW: 12.6 % (ref 11.5–15.5)
WBC: 6.6 10*3/uL (ref 4.0–10.5)
nRBC: 0 % (ref 0.0–0.2)

## 2023-03-29 LAB — LIPASE, BLOOD: Lipase: 42 U/L (ref 11–51)

## 2023-03-29 LAB — URINALYSIS, ROUTINE W REFLEX MICROSCOPIC
Bilirubin Urine: NEGATIVE
Glucose, UA: NEGATIVE mg/dL
Hgb urine dipstick: NEGATIVE
Ketones, ur: NEGATIVE mg/dL
Leukocytes,Ua: NEGATIVE
Nitrite: NEGATIVE
Protein, ur: NEGATIVE mg/dL
Specific Gravity, Urine: 1.009 (ref 1.005–1.030)
pH: 6 (ref 5.0–8.0)

## 2023-03-29 LAB — TROPONIN I (HIGH SENSITIVITY): Troponin I (High Sensitivity): 5 ng/L (ref <18)

## 2023-03-29 MED ORDER — HYDROMORPHONE HCL 1 MG/ML IJ SOLN
0.5000 mg | Freq: Once | INTRAMUSCULAR | Status: AC
  Administered 2023-03-29: 0.5 mg via INTRAVENOUS
  Filled 2023-03-29: qty 0.5

## 2023-03-29 MED ORDER — HYDROMORPHONE HCL 1 MG/ML IJ SOLN
0.5000 mg | INTRAMUSCULAR | Status: AC
  Administered 2023-03-29: 0.5 mg via INTRAVENOUS
  Filled 2023-03-29: qty 0.5

## 2023-03-29 MED ORDER — IOHEXOL 300 MG/ML  SOLN
100.0000 mL | Freq: Once | INTRAMUSCULAR | Status: AC | PRN
  Administered 2023-03-29: 100 mL via INTRAVENOUS

## 2023-03-29 MED ORDER — ONDANSETRON HCL 4 MG/2ML IJ SOLN
4.0000 mg | Freq: Once | INTRAMUSCULAR | Status: AC
  Administered 2023-03-29: 4 mg via INTRAVENOUS
  Filled 2023-03-29: qty 2

## 2023-03-29 NOTE — ED Notes (Signed)
See triage note Presents with mid abd pain with n/v  States she has a hx of bowel obstructions in past  States this feels the same

## 2023-03-29 NOTE — ED Triage Notes (Signed)
Pt states coming in with mid-abdominal pain, nausea and vomiting since this AM. Pt states a history of ovarian cysts.

## 2023-03-29 NOTE — Discharge Instructions (Addendum)
Your workup shows a CT scan as below. Discussed admission versus transfer to [facility] for observation however you are able to tolerate drinking and prefer to go home.  We discussed doing a liquid diet and following up with your bariatric team tomorrow if symptoms are not improving or return to the ER for worsening symptoms    IMPRESSION: 1. Small bowel intussusception in the lower anterior abdomen without definite bowel obstruction. 2. Trace free fluid in the pelvis is likely physiologic.

## 2023-03-29 NOTE — ED Provider Notes (Signed)
[facility] Provider Note    Event Date/Time   First MD Initiated Contact with Patient 03/29/23 1332     (approximate)   History   Abdominal Pain   HPI  Maria Fuller is a 37 y.o. female with a ovarian cyst, bowel obstructions, hernias who comes in with concerns for abdominal pain.  Patient reports upper abdominal discomfort associate with some nausea, vomiting.  She reports it feels like her prior bowel obstructions.  She does report having history of ovarian cyst but her pain from that has resolved.  She reports that she had a repeat ultrasound that showed resolution of the cyst not too long ago.  Pt has had Duodenal switch at [facility], internal hernia, obstructions that have had to be unfolded per patient, incision hernia repair with mesh at [facility]      Physical Exam   Triage Vital Signs: ED Triage Vitals  Encounter Vitals Group     BP 03/29/23 1247 126/66     Systolic BP Percentile --      Diastolic BP Percentile --      Pulse Rate 03/29/23 1247 61     Resp 03/29/23 1247 17     Temp 03/29/23 1247 98.6 F (37 C)     Temp src --      SpO2 03/29/23 1247 97 %     Weight 03/29/23 1246 240 lb (108.9 kg)     Height 03/29/23 1246 6\' 1"  (1.854 m)     Head Circumference --      Peak Flow --      Pain Score 03/29/23 1246 5     Pain Loc --      Pain Education --      Exclude from Growth Chart --     Most recent vital signs: Vitals:   03/29/23 1247  BP: 126/66  Pulse: 61  Resp: 17  Temp: 98.6 F (37 C)  SpO2: 97%     General: Awake, no distress.  CV:  Good peripheral perfusion.  Resp:  Normal effort.  Abd:  No distention.  Tender in the upper abdomen Other:     ED Results / Procedures / Treatments   Labs (all labs ordered are listed, but only abnormal results are displayed) Labs Reviewed  COMPREHENSIVE METABOLIC PANEL - Abnormal; Notable for the following components:      Result Value   Calcium 8.0 (*)    All other  components within normal limits  CBC - Abnormal; Notable for the following components:   RBC 3.59 (*)    Hemoglobin 11.1 (*)    HCT 34.1 (*)    All other components within normal limits  URINALYSIS, ROUTINE W REFLEX MICROSCOPIC - Abnormal; Notable for the following components:   Color, Urine YELLOW (*)    APPearance CLEAR (*)    All other components within normal limits  LIPASE, BLOOD  POC URINE PREG, ED     RADIOLOGY I have reviewed the CT personally interpreted IUD in place    PROCEDURES:  Critical Care performed: No  Procedures   MEDICATIONS ORDERED IN ED: Medications  HYDROmorphone (DILAUDID) injection 0.5 mg (0.5 mg Intravenous Given 03/29/23 1426)  ondansetron (ZOFRAN) injection 4 mg (4 mg Intravenous Given 03/29/23 1426)     IMPRESSION / MDM / ASSESSMENT AND PLAN / ED COURSE  I reviewed the triage vital signs and the nursing notes.   Patient's presentation is most consistent with acute presentation with potential threat to  life or bodily function.   Patient comes in with abdominal pain nausea, vomiting.  Feels like her prior obstructions.  CT imaging ordered evaluate for obstruction, perforation, hernia.  No lower abdominal pain to suggest cyst issue at this time.  Patient be given some IV Dilaudid IV Zofran.  She reportedly had an ultrasound done on Monday that did not show any cyst.  We discussed repeating an ultrasound today but patient is declined given recent ultrasound without any cysts and this pain seems similar to when she has had issues with her bowels.  Troponin negative.  Lipase normal CMP reassuring.  CBC shows stable hemoglobin.  hCG is negative.   No stool since yesterday evening, denies passing gas, feels bloated, nausea is bette  IMPRESSION: 1. Small bowel intussusception in the lower anterior abdomen without definite bowel obstruction. 2. Trace free fluid in the pelvis is likely physiologic.  The case with Dr. Tonna Boehringer who did state that this  was nonsurgical.   Discussed with patient offered her admission for observation, also discussed with patient if she wanted me to try to get a hold of her bariatric team over at [facility] given she does report that there was 1 time when she came here and they treated her conservatively and sent her home and her doctors reviewed the imaging and thought that it probably should have had a surgery and they ended up admitting her for surgery.  I explained to her that with a history of duodenal switch that this is a more complicated bariatric surgery and that if she is symptomatic or not tolerating p.o. I be happy to call over to [facility] to discuss transfer and to have them review the images.  At this time however patient states that her nausea is better was able to tolerate drinking a glass of water and although feeling a little bit of bloating overall feels much better than earlier.  She declines me trying to talk with them for transfer and states that she will go there tomorrow call her team tomorrow if her symptoms are continuing to worsen but at this time she feels comfortable with discharge home     FINAL CLINICAL IMPRESSION(S) / ED DIAGNOSES   Final diagnoses:  Intussusception (HCC)     Rx / DC Orders   ED Discharge Orders     None        Note:  This document was prepared using Dragon voice recognition software and may include unintentional dictation errors.   Concha Se, MD 03/29/23 [phone]

## 2023-03-30 LAB — POC URINE PREG, ED: Preg Test, Ur: NEGATIVE

## 2023-05-05 ENCOUNTER — Other Ambulatory Visit: Payer: Self-pay | Admitting: Obstetrics and Gynecology

## 2023-05-05 DIAGNOSIS — N83202 Unspecified ovarian cyst, left side: Secondary | ICD-10-CM

## 2023-05-05 DIAGNOSIS — Z8742 Personal history of other diseases of the female genital tract: Secondary | ICD-10-CM

## 2023-05-05 DIAGNOSIS — R102 Pelvic and perineal pain: Secondary | ICD-10-CM

## 2023-05-10 ENCOUNTER — Other Ambulatory Visit: Payer: Self-pay

## 2023-05-10 ENCOUNTER — Emergency Department
Admission: EM | Admit: 2023-05-10 | Discharge: 2023-05-10 | Disposition: A | Discharge status: Home / Self Care | Payer: 59 | Attending: Emergency Medicine | Admitting: Emergency Medicine

## 2023-05-10 ENCOUNTER — Emergency Department: Payer: 59

## 2023-05-10 DIAGNOSIS — R1031 Right lower quadrant pain: Secondary | ICD-10-CM | POA: Insufficient documentation

## 2023-05-10 DIAGNOSIS — R110 Nausea: Secondary | ICD-10-CM | POA: Diagnosis not present

## 2023-05-10 LAB — CBC WITH DIFFERENTIAL/PLATELET
Abs Immature Granulocytes: 0.01 10*3/uL (ref 0.00–0.07)
Basophils Absolute: 0 10*3/uL (ref 0.0–0.1)
Basophils Relative: 1 %
Eosinophils Absolute: 0.2 10*3/uL (ref 0.0–0.5)
Eosinophils Relative: 3 %
HCT: 33.6 % — ABNORMAL LOW (ref 36.0–46.0)
Hemoglobin: 10.9 g/dL — ABNORMAL LOW (ref 12.0–15.0)
Immature Granulocytes: 0 %
Lymphocytes Relative: 34 %
Lymphs Abs: 2.4 10*3/uL (ref 0.7–4.0)
MCH: 30.5 pg (ref 26.0–34.0)
MCHC: 32.4 g/dL (ref 30.0–36.0)
MCV: 94.1 fL (ref 80.0–100.0)
Monocytes Absolute: 0.5 10*3/uL (ref 0.1–1.0)
Monocytes Relative: 7 %
Neutro Abs: 3.8 10*3/uL (ref 1.7–7.7)
Neutrophils Relative %: 55 %
Platelets: 372 10*3/uL (ref 150–400)
RBC: 3.57 MIL/uL — ABNORMAL LOW (ref 3.87–5.11)
RDW: 13.5 % (ref 11.5–15.5)
WBC: 6.9 10*3/uL (ref 4.0–10.5)
nRBC: 0 % (ref 0.0–0.2)

## 2023-05-10 LAB — COMPREHENSIVE METABOLIC PANEL
ALT: 21 U/L (ref 0–44)
AST: 19 U/L (ref 15–41)
Albumin: 4.1 g/dL (ref 3.5–5.0)
Alkaline Phosphatase: 80 U/L (ref 38–126)
Anion gap: 8 (ref 5–15)
BUN: 14 mg/dL (ref 6–20)
CO2: 22 mmol/L (ref 22–32)
Calcium: 8.2 mg/dL — ABNORMAL LOW (ref 8.9–10.3)
Chloride: 109 mmol/L (ref 98–111)
Creatinine, Ser: 0.52 mg/dL (ref 0.44–1.00)
GFR, Estimated: >60 mL/min (ref >60)
Glucose, Bld: 84 mg/dL (ref 70–99)
Potassium: 3.8 mmol/L (ref 3.5–5.1)
Sodium: 139 mmol/L (ref 135–145)
Total Bilirubin: 0.8 mg/dL (ref 0.0–1.2)
Total Protein: 7.1 g/dL (ref 6.5–8.1)

## 2023-05-10 LAB — URINALYSIS, ROUTINE W REFLEX MICROSCOPIC
Bilirubin Urine: NEGATIVE
Glucose, UA: NEGATIVE mg/dL
Hgb urine dipstick: NEGATIVE
Ketones, ur: NEGATIVE mg/dL
Leukocytes,Ua: NEGATIVE
Nitrite: NEGATIVE
Protein, ur: NEGATIVE mg/dL
Specific Gravity, Urine: 1.027 (ref 1.005–1.030)
pH: 5 (ref 5.0–8.0)

## 2023-05-10 LAB — LIPASE, BLOOD: Lipase: 23 U/L (ref 11–51)

## 2023-05-10 LAB — PREGNANCY, URINE: Preg Test, Ur: NEGATIVE

## 2023-05-10 MED ORDER — SODIUM CHLORIDE 0.9 % IV BOLUS
1000.0000 mL | Freq: Once | INTRAVENOUS | Status: AC
  Administered 2023-05-10: 1000 mL via INTRAVENOUS

## 2023-05-10 MED ORDER — HYDROCODONE-ACETAMINOPHEN 5-325 MG PO TABS
1.0000 | ORAL_TABLET | Freq: Once | ORAL | Status: AC
  Administered 2023-05-10: 1 via ORAL
  Filled 2023-05-10: qty 1

## 2023-05-10 MED ORDER — ONDANSETRON HCL 4 MG/2ML IJ SOLN
4.0000 mg | Freq: Once | INTRAMUSCULAR | Status: AC
  Administered 2023-05-10: 4 mg via INTRAVENOUS
  Filled 2023-05-10: qty 2

## 2023-05-10 MED ORDER — DICYCLOMINE HCL 20 MG PO TABS
20.0000 mg | ORAL_TABLET | Freq: Once | ORAL | Status: AC
  Administered 2023-05-10: 20 mg via ORAL
  Filled 2023-05-10: qty 1

## 2023-05-10 MED ORDER — MORPHINE SULFATE (PF) 4 MG/ML IV SOLN
4.0000 mg | Freq: Once | INTRAVENOUS | Status: AC
  Administered 2023-05-10: 4 mg via INTRAVENOUS
  Filled 2023-05-10: qty 1

## 2023-05-10 MED ORDER — IOHEXOL 300 MG/ML  SOLN
100.0000 mL | Freq: Once | INTRAMUSCULAR | Status: AC | PRN
  Administered 2023-05-10: 100 mL via INTRAVENOUS

## 2023-05-10 NOTE — ED Triage Notes (Addendum)
 Pt c/o RLQ pain x3 days with nausea. Pt reports eating makes the pain worse. Pt reports hx of bowel obstructions. Pt reports she still has her appendix. Pt has had 2 CS, R ovary removed, hernia repair, and surgery due to obstruction.

## 2023-05-10 NOTE — Discharge Instructions (Signed)
You were seen in the Emergency Department today for evaluation of your abdominal pain. Fortunately, your labs, urine test, and CT scan were overall reassuring against an emergency cause for your pain. Please follow-up with your primary care doctor within the next few days for reevaluation. Return to the ER for any new or worsening symptoms including worsening pain, inability to tolerate food or liquids, or any other new or concerning symptoms 

## 2023-05-10 NOTE — ED Provider Notes (Signed)
 [facility] Provider Note    Event Date/Time   First MD Initiated Contact with Patient 05/10/23 1339     (approximate)   History   Abdominal Pain   HPI  Maria Fuller is a 38 year old female with complex abdominal history including duodenal switch, prior bowel obstructions, ovarian cyst, hernia presenting to the emergency department for evaluation of abdominal pain.  The past 3 days, patient has had right lower quadrant abdominal pain with associated nausea.  Continue to have formed bowel movements.  No reported fevers.  History of right oophorectomy.  Does still have her appendix.    Physical Exam   Triage Vital Signs: ED Triage Vitals  Encounter Vitals Group     BP 05/10/23 1130 (!) 145/96     Systolic BP Percentile --      Diastolic BP Percentile --      Pulse Rate 05/10/23 1130 60     Resp 05/10/23 1130 18     Temp 05/10/23 1129 98.2 F (36.8 C)     Temp Source 05/10/23 1129 Oral     SpO2 05/10/23 1130 100 %     Weight --      Height --      Head Circumference --      Peak Flow --      Pain Score 05/10/23 1130 6     Pain Loc --      Pain Education --      Exclude from Growth Chart --     Most recent vital signs: Vitals:   05/10/23 1129 05/10/23 1130  BP:  (!) 145/96  Pulse:  60  Resp:  18  Temp: 98.2 F (36.8 C)   SpO2:  100%     General: Awake, interactive  CV:  Regular rate, good peripheral perfusion.  Resp:  Unlabored respirations, lungs good auscultation Abd:  Nondistended, soft, focal tenderness to palpation of the right lower quadrant without rebound or guarding Neuro:  Symmetric facial movement, fluid speech   ED Results / Procedures / Treatments   Labs (all labs ordered are listed, but only abnormal results are displayed) Labs Reviewed  CBC WITH DIFFERENTIAL/PLATELET - Abnormal; Notable for the following components:      Result Value   RBC 3.57 (*)    Hemoglobin 10.9 (*)    HCT 33.6 (*)    All other  components within normal limits  COMPREHENSIVE METABOLIC PANEL - Abnormal; Notable for the following components:   Calcium 8.2 (*)    All other components within normal limits  URINALYSIS, ROUTINE W REFLEX MICROSCOPIC - Abnormal; Notable for the following components:   Color, Urine YELLOW (*)    APPearance HAZY (*)    All other components within normal limits  LIPASE, BLOOD  PREGNANCY, URINE     EKG EKG independently reviewed interpreted by myself (ER attending) demonstrates:    RADIOLOGY Imaging independently reviewed and interpreted by myself demonstrates:  CT abdomen pelvis without acute findings.  Specifically normal-appearing appendix, no evidence of bowel obstruction  PROCEDURES:  Critical Care performed: No  Procedures   MEDICATIONS ORDERED IN ED: Medications  ondansetron  (ZOFRAN ) injection 4 mg (4 mg Intravenous Given 05/10/23 1409)  morphine  (PF) 4 MG/ML injection 4 mg (4 mg Intravenous Given 05/10/23 1410)  sodium chloride  0.9 % bolus 1,000 mL (1,000 mLs Intravenous New Bag/Given 05/10/23 1408)  iohexol  (OMNIPAQUE ) 300 MG/ML solution 100 mL (100 mLs Intravenous Contrast Given 05/10/23 1434)  HYDROcodone -acetaminophen  (NORCO/VICODIN) 5-325 MG per  tablet 1 tablet (1 tablet Oral Given 05/10/23 1558)  dicyclomine  (BENTYL ) tablet 20 mg (20 mg Oral Given 05/10/23 1558)     IMPRESSION / MDM / ASSESSMENT AND PLAN / ED COURSE  I reviewed the triage vital signs and the nursing notes.  Differential diagnosis includes, but is not limited to, appendicitis, bowel obstruction, colitis, other acute intra-abdominal process  Patient's presentation is most consistent with acute presentation with potential threat to life or bodily function.  38 year old female presenting to the emerged part for evaluation of right lower quadrant pain.  Vital stable on presentation.  Labs overall reassuring.  Stable anemia noted.  Treated symptomatically with IV fluids, morphine , Zofran .  CT fortunately  without acute findings.  Patient reevaluated with some ongoing discomfort, ordered for Norco and Bentyl .  On reevaluation, patient does feel improved.  She is comfortable with discharge home.  Strict return precautions provided.  Patient discharged in stable condition.      FINAL CLINICAL IMPRESSION(S) / ED DIAGNOSES   Final diagnoses:  Right lower quadrant abdominal pain     Rx / DC Orders   ED Discharge Orders     None        Note:  This document was prepared using Dragon voice recognition software and may include unintentional dictation errors.   Levander Slate, MD 05/10/23 [phone]

## 2023-06-02 ENCOUNTER — Encounter: Payer: Self-pay | Admitting: Urology

## 2023-06-03 ENCOUNTER — Ambulatory Visit
Admission: RE | Admit: 2023-06-03 | Discharge: 2023-06-03 | Disposition: A | Discharge status: Home / Self Care | Payer: 59 | Source: Ambulatory Visit | Attending: Urology

## 2023-06-03 ENCOUNTER — Ambulatory Visit
Admission: RE | Admit: 2023-06-03 | Discharge: 2023-06-03 | Disposition: A | Discharge status: Home / Self Care | Payer: 59 | Attending: Urology | Admitting: Urology

## 2023-06-03 DIAGNOSIS — N200 Calculus of kidney: Secondary | ICD-10-CM | POA: Insufficient documentation

## 2023-06-08 NOTE — Telephone Encounter (Signed)
Patient has a follow up appointment with Dr Apolinar Junes on 06/09/23-tomorrow, any other recommendations prior to the appointment?

## 2023-06-09 ENCOUNTER — Ambulatory Visit (INDEPENDENT_AMBULATORY_CARE_PROVIDER_SITE_OTHER): Payer: 59 | Admitting: Urology

## 2023-06-09 VITALS — BP 136/85 | HR 57 | Ht 73.0 in | Wt 247.2 lb

## 2023-06-09 DIAGNOSIS — Z87442 Personal history of urinary calculi: Secondary | ICD-10-CM | POA: Diagnosis not present

## 2023-06-09 DIAGNOSIS — Z09 Encounter for follow-up examination after completed treatment for conditions other than malignant neoplasm: Secondary | ICD-10-CM | POA: Diagnosis not present

## 2023-06-09 NOTE — Progress Notes (Addendum)
 I, [name], acting as a scribe for Rosina Riis, MD.,have documented all relevant documentation on the behalf of Rosina Riis, MD,as directed by  Rosina Riis, MD while in the presence of Rosina Riis, MD.  06/09/2023 4:15 PM   Maria Fuller 1985-11-17 [id number]  Referring provider: Perri Constance Sor, PA-C [address] [place],  [place] [address]  Chief Complaint  Patient presents with   Nephrolithiasis    HPI: 38 year-old female with a personal history of recurrent stones presents today for a routine annual follow-up.   Incidentally she recently sent a MyChart message indicating she's been having some intermittent right sided back and side pain that was killing her. She had a KUB on 06/03/2023 that shows no significant appreciable stone burden. She also underwent CT scan on 05/10/2023 which showed no kidney stones and a phlebolith (which was present of previous imaging in the past). Her urinalysis that day was negative.   She has underwent multiple ureteroscopes; see previous notes for details.   Urinalysis today is pending results.  She reports having some stomach pain on the right which she discussed with her bariatric surgeon last month. She has been told she is constipated but she has regular bowel movements. She is on Senokot and Flagyl.    PMH: Past Medical History:  Diagnosis Date   Anemia    slight   Bradycardia    Depression    Diabetes mellitus without complication (HCC)    resolved with weight loss from gastric bypass   Headache    migraines. resolved since gastric bypass   High cholesterol    resolved since weight loss surgery   History of kidney stones    last week   Hypertension    resolved since gastric bypass, severe pre-eclampsia with G1   Incarcerated incisional hernia 07/27/2021   Low BP    Meningitis 2015   Partial small bowel obstruction (HCC) 08/04/2019   PCOS (polycystic ovarian syndrome)    Preterm labor    Recurrent  ventral incisional hernia    Sleep apnea    had gastric bypass surgery and now resolved   Syncope    Vertigo couple years ago    Surgical History: Past Surgical History:  Procedure Laterality Date   BIOPSY  11/26/2022   Procedure: BIOPSY;  Surgeon: Unk Corinn Skiff, MD;  Location: [facility];  Service: Gastroenterology;;   CESAREAN SECTION     CESAREAN SECTION N/A 05/07/2016   Procedure: CESAREAN SECTION;  Surgeon: Lamar SHAUNNA Lesches, MD;  Location: [facility];  Service: Obstetrics;  Laterality: N/A;  Time of birth: [birth year] Sex: Female Weight: 3940 kg, 8 lb 11 oz   CHOLECYSTECTOMY     COLON SURGERY     COLONOSCOPY WITH PROPOFOL  N/A 03/16/2023   Procedure: COLONOSCOPY WITH PROPOFOL ;  Surgeon: Maryruth Ole DASEN, MD;  Location: [facility];  Service: Endoscopy;  Laterality: N/A;   CYSTOSCOPY WITH STENT PLACEMENT Right 09/19/2016   Procedure: CYSTOSCOPY WITH STENT PLACEMENT;  Surgeon: Chauncey Redell Agent, MD;  Location: [facility];  Service: Urology;  Laterality: Right;   CYSTOSCOPY WITH STENT PLACEMENT Left 04/18/2022   Procedure: CYSTOSCOPY WITH STENT PLACEMENT;  Surgeon: Watt Rush, MD;  Location: [facility];  Service: Urology;  Laterality: Left;   CYSTOSCOPY/URETEROSCOPY/HOLMIUM LASER/STENT PLACEMENT Left 05/08/2022   Procedure: CYSTOSCOPY/URETEROSCOPY/HOLMIUM LASER/STENT EXCHANGE;  Surgeon: Riis Rosina, MD;  Location: [facility];  Service: Urology;  Laterality: Left;   DIAGNOSTIC LAPAROSCOPY     ESOPHAGOGASTRODUODENOSCOPY (EGD) WITH PROPOFOL  N/A  11/23/2022   Procedure: ESOPHAGOGASTRODUODENOSCOPY (EGD) WITH PROPOFOL ;  Surgeon: Therisa Bi, MD;  Location: [facility];  Service: Gastroenterology;  Laterality: N/A;   ESOPHAGOGASTRODUODENOSCOPY (EGD) WITH PROPOFOL  N/A 11/26/2022   Procedure: ESOPHAGOGASTRODUODENOSCOPY (EGD) WITH PROPOFOL ;  Surgeon: Unk Corinn Skiff, MD;  Location: [facility];  Service: Gastroenterology;  Laterality: N/A;   EXTRACORPOREAL SHOCK WAVE LITHOTRIPSY  Left 05/15/2022   Procedure: EXTRACORPOREAL SHOCK WAVE LITHOTRIPSY (ESWL);  Surgeon: Penne Knee, MD;  Location: [facility];  Service: Urology;  Laterality: Left;   FLEXIBLE SIGMOIDOSCOPY N/A 07/28/2019   Procedure: FLEXIBLE SIGMOIDOSCOPY;  Surgeon: Unk Corinn Skiff, MD;  Location: [facility];  Service: Endoscopy;  Laterality: N/A;   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   HERNIA REPAIR     INSERTION OF MESH N/A 07/07/2022   Procedure: INSERTION OF MESH;  Surgeon: Lane Shope, MD;  Location: [facility];  Service: General;  Laterality: N/A;   LAPAROSCOPIC OVARIAN CYSTECTOMY Right 09/28/2018   Procedure: LAPAROSCOPIC RIGHT OVARIAN CYSTECTOMY;  Surgeon: Arloa Lamar SQUIBB, MD;  Location: [facility];  Service: Gynecology;  Laterality: Right;   LAPAROSCOPIC UNILATERAL SALPINGO OOPHERECTOMY Right 07/25/2021   Procedure: LAPAROSCOPIC UNILATERAL SALPINGO OOPHORECTOMY;  Surgeon: Arloa Lamar SQUIBB, MD;  Location: [facility];  Service: Gynecology;  Laterality: Right;   LEFT HEART CATH AND CORONARY ANGIOGRAPHY Left 04/07/2018   Procedure: LEFT HEART CATH AND CORONARY ANGIOGRAPHY;  Surgeon: Florencio Cara BIRCH, MD;  Location: [facility];  Service: Cardiovascular;  Laterality: Left;   OOPHORECTOMY     POLYPECTOMY  03/16/2023   Procedure: POLYPECTOMY;  Surgeon: Maryruth Ole DASEN, MD;  Location: [facility] END[facility];  Service: Endoscopy;;   small bowel obstruction  07/2019   TONSILECTOMY, ADENOIDECTOMY, BILATERAL MYRINGOTOMY AND TUBES     TONSILLECTOMY     URETEROSCOPY WITH HOLMIUM LASER LITHOTRIPSY Right 09/19/2016   Procedure: URETEROSCOPY WITH HOLMIUM LASER LITHOTRIPSY;  Surgeon: Chauncey Redell Agent, MD;  Location: [facility];  Service: Urology;  Laterality: Right;   VENTRAL HERNIA REPAIR     scheduled for 07/07/2022   XI ROBOTIC ASSISTED VENTRAL HERNIA N/A 07/27/2021   Procedure: XI ROBOTIC ASSISTED INCARCERATED INCISIONAL HERNIA REPAIR;  Surgeon: Lane Shope, MD;  Location: [facility];  Service: General;  Laterality: N/A;   XI ROBOTIC ASSISTED VENTRAL HERNIA N/A 07/07/2022   Procedure: XI ROBOTIC ASSISTED VENTRAL HERNIA, recurrent incisional with mesh;  Surgeon: Lane Shope, MD;  Location: [facility];  Service: General;  Laterality: N/A;    Home Medications:  Allergies as of 06/09/2023       Reactions   Compazine  [prochlorperazine ] Other (See Comments)   Panic attack   Ceftin [cefuroxime Axetil] Rash   Tolerated cefazolin  before   Penicillins Rash   TOLERATED CEFAZOLIN  BEFORE        Medication List        Accurate as of June 09, 2023  4:15 PM. If you have any questions, ask your nurse or doctor.          STOP taking these medications    HYDROcodone -acetaminophen  5-325 MG tablet Commonly known as: Norco   hyoscyamine  0.125 MG/ML solution Commonly known as: LEVSIN        TAKE these medications    buPROPion  150 MG 24 hr tablet Commonly known as: WELLBUTRIN  XL Take 150 mg by mouth every morning.   dicyclomine  10 MG capsule Commonly known as: BENTYL  Take 1 capsule (10 mg total) by mouth 4 (four) times daily -  before meals and at bedtime.   gabapentin   300 MG capsule Commonly known as: NEURONTIN  Take 300 mg by mouth 3 (three) times daily.   hydrOXYzine  25 MG tablet Commonly known as: ATARAX  Take 1 tablet by mouth 3 (three) times daily as needed.   levonorgestrel  20 MCG/24HR IUD Commonly known as: MIRENA  1 each by Intrauterine route once.   ondansetron  4 MG disintegrating tablet Commonly known as: ZOFRAN -ODT Take 1 tablet (4 mg total) by mouth every 8 (eight) hours as needed for nausea or vomiting.   pantoprazole  40 MG tablet Commonly known as: PROTONIX  Take 1 tablet (40 mg total) by mouth 2 (two) times daily before a meal.   scopolamine  1 MG/3DAYS Commonly known as: TRANSDERM-SCOP 1 patch every three (3) days as needed.   sertraline  100 MG tablet Commonly known as: ZOLOFT  Take 100 mg by mouth 2 (two) times daily.    traZODone  50 MG tablet Commonly known as: DESYREL  Take 50 mg by mouth at bedtime.        Allergies:  Allergies  Allergen Reactions   Compazine  [Prochlorperazine ] Other (See Comments)    Panic attack   Ceftin [Cefuroxime Axetil] Rash    Tolerated cefazolin  before   Penicillins Rash    TOLERATED CEFAZOLIN  BEFORE     Family History: Family History  Problem Relation Age of Onset   Polycystic ovary syndrome Mother    Diabetes Father    Bladder Cancer Father    Depression Father    Endometriosis Sister    Polycystic ovary syndrome Sister    Breast cancer Paternal Grandmother    Diabetes Paternal Grandmother    Diabetes Paternal Grandfather    Kidney cancer Neg Hx    Prostate cancer Neg Hx     Social History:  reports that she has never smoked. She has never been exposed to tobacco smoke. She has never used smokeless tobacco. She reports that she does not drink alcohol and does not use drugs.   Physical Exam: BP 136/85   Pulse (!) 57   Ht 6' 1 (1.854 m)   Wt 247 lb 4 oz (112.2 kg)   BMI 32.62 kg/m   Constitutional:  Alert and oriented, No acute distress. HEENT: [place] AT, moist mucus membranes.  Trachea midline, no masses. Neurologic: Grossly intact, no focal deficits, moving all 4 extremities. Psychiatric: Normal mood and affect.   Pertinent Imaging: Results for orders placed during the hospital encounter of 05/29/22  DG Abd 1 View  Narrative CLINICAL DATA:  Lithotripsy January 4th and May 15, 2022  EXAM: ABDOMEN - 1 VIEW  COMPARISON:  KUB May 15, 2022.  CT scan May 27, 2022.  FINDINGS: A calcification in the lower pole of the left kidney correlates with the known stone identified on the May 27, 2022 CT scan. No other renal stones are noted. No ureteral stones identified. An IUD is seen in the pelvis. No other acute abnormalities.  IMPRESSION: A calcification in the lower pole of the left kidney correlates with the known stone  identified in the lower pole of the left kidney on the May 27, 2022 CT scan. No ureteral stones noted on this study. CT imaging would be more sensitive.  Electronically Signed By: Alm Pouch III M.D. On: 06/01/2022 18:01  Narrative & Impression  CLINICAL DATA:  Right lower quadrant abdominal pain for 3 days.   EXAM: CT ABDOMEN AND PELVIS WITH CONTRAST   TECHNIQUE: Multidetector CT imaging of the abdomen and pelvis was performed using the standard protocol following bolus administration of intravenous contrast.  RADIATION DOSE REDUCTION: This exam was performed according to the departmental dose-optimization program which includes automated exposure control, adjustment of the mA and/or kV according to patient size and/or use of iterative reconstruction technique.   CONTRAST:  OMNIPAQUE  IOHEXOL  300 MG/ML  SOLN   COMPARISON:  CT abdomen and pelvis dated 03/29/2023.   FINDINGS: Lower chest: Mild bibasilar atelectasis.   Hepatobiliary: No focal liver abnormality is seen. Status post cholecystectomy. No biliary dilatation.   Pancreas: Unremarkable. No pancreatic ductal dilatation or surrounding inflammatory changes.   Spleen: No splenic injury or perisplenic hematoma.   Adrenals/Urinary Tract: Adrenal glands are unremarkable. Kidneys are normal, without renal calculi, focal lesion, or hydronephrosis. Bladder is unremarkable.   Stomach/Bowel: The patient is status post duodenal switch procedure. Multiple surgical anastomoses are seen in the right lower quadrant. Appendix appears normal. No evidence of bowel wall thickening, distention, or inflammatory changes.   Vascular/Lymphatic: No significant vascular findings are present. No enlarged abdominal or pelvic lymph nodes.   Reproductive: An intrauterine contraceptive devices noted. No adnexal mass.   Other: No abdominal wall hernia or abnormality. No abdominopelvic ascites.   Musculoskeletal:  Degenerative changes are seen in the spine. Unchanged 5 mm anterolisthesis of L5 on S1 with bilateral L5 pars defects.   IMPRESSION: 1. No acute process in the abdomen or pelvis.   Electronically Signed   By: Norman Hopper M.D.   On: 05/10/2023 15:21  Personally reviewed the above images and agree with radiologic interpretation.    Assessment & Plan:    1. History of kidney stones  - No current stone burden based on most recent CT scan and KUB today which is reassuring. Continue adequate hydration. Stomach pain may be related to possible GI issues.  Given that she has not produced a stone in a year, we will put her on recall list for 2 years or sooner if she has any issues.  She is agreeable this plan.  [facility] [address], [address] [place], [place] [address] [phone]

## 2023-06-23 ENCOUNTER — Other Ambulatory Visit: Payer: Self-pay

## 2023-06-23 ENCOUNTER — Emergency Department: Payer: 59

## 2023-06-23 ENCOUNTER — Encounter: Payer: Self-pay | Admitting: Urology

## 2023-06-23 ENCOUNTER — Emergency Department
Admission: EM | Admit: 2023-06-23 | Discharge: 2023-06-23 | Disposition: A | Discharge status: Home / Self Care | Payer: 59 | Attending: Emergency Medicine | Admitting: Emergency Medicine

## 2023-06-23 DIAGNOSIS — K5900 Constipation, unspecified: Secondary | ICD-10-CM | POA: Insufficient documentation

## 2023-06-23 DIAGNOSIS — I10 Essential (primary) hypertension: Secondary | ICD-10-CM | POA: Insufficient documentation

## 2023-06-23 DIAGNOSIS — N83202 Unspecified ovarian cyst, left side: Secondary | ICD-10-CM | POA: Insufficient documentation

## 2023-06-23 DIAGNOSIS — Z8616 Personal history of COVID-19: Secondary | ICD-10-CM | POA: Diagnosis not present

## 2023-06-23 DIAGNOSIS — R1030 Lower abdominal pain, unspecified: Secondary | ICD-10-CM | POA: Diagnosis present

## 2023-06-23 DIAGNOSIS — E119 Type 2 diabetes mellitus without complications: Secondary | ICD-10-CM | POA: Insufficient documentation

## 2023-06-23 DIAGNOSIS — N390 Urinary tract infection, site not specified: Secondary | ICD-10-CM

## 2023-06-23 DIAGNOSIS — R109 Unspecified abdominal pain: Secondary | ICD-10-CM

## 2023-06-23 DIAGNOSIS — N949 Unspecified condition associated with female genital organs and menstrual cycle: Secondary | ICD-10-CM

## 2023-06-23 LAB — URINALYSIS, ROUTINE W REFLEX MICROSCOPIC
Bilirubin Urine: NEGATIVE
Glucose, UA: NEGATIVE mg/dL
Ketones, ur: NEGATIVE mg/dL
Nitrite: NEGATIVE
Protein, ur: NEGATIVE mg/dL
Specific Gravity, Urine: 1.012 (ref 1.005–1.030)
WBC, UA: >50 WBC/hpf (ref 0–5)
pH: 6 (ref 5.0–8.0)

## 2023-06-23 LAB — LIPASE, BLOOD: Lipase: 23 U/L (ref 11–51)

## 2023-06-23 LAB — COMPREHENSIVE METABOLIC PANEL
ALT: 16 U/L (ref 0–44)
AST: 14 U/L — ABNORMAL LOW (ref 15–41)
Albumin: 3.7 g/dL (ref 3.5–5.0)
Alkaline Phosphatase: 83 U/L (ref 38–126)
Anion gap: 6 (ref 5–15)
BUN: 14 mg/dL (ref 6–20)
CO2: 23 mmol/L (ref 22–32)
Calcium: 8.2 mg/dL — ABNORMAL LOW (ref 8.9–10.3)
Chloride: 110 mmol/L (ref 98–111)
Creatinine, Ser: 0.54 mg/dL (ref 0.44–1.00)
GFR, Estimated: >60 mL/min (ref >60)
Glucose, Bld: 82 mg/dL (ref 70–99)
Potassium: 3.5 mmol/L (ref 3.5–5.1)
Sodium: 139 mmol/L (ref 135–145)
Total Bilirubin: 0.4 mg/dL (ref 0.0–1.2)
Total Protein: 6.6 g/dL (ref 6.5–8.1)

## 2023-06-23 LAB — CBC
HCT: 31.5 % — ABNORMAL LOW (ref 36.0–46.0)
Hemoglobin: 10.3 g/dL — ABNORMAL LOW (ref 12.0–15.0)
MCH: 30.4 pg (ref 26.0–34.0)
MCHC: 32.7 g/dL (ref 30.0–36.0)
MCV: 92.9 fL (ref 80.0–100.0)
Platelets: 305 10*3/uL (ref 150–400)
RBC: 3.39 MIL/uL — ABNORMAL LOW (ref 3.87–5.11)
RDW: 13.3 % (ref 11.5–15.5)
WBC: 9.2 10*3/uL (ref 4.0–10.5)
nRBC: 0 % (ref 0.0–0.2)

## 2023-06-23 LAB — PREGNANCY, URINE: Preg Test, Ur: NEGATIVE

## 2023-06-23 MED ORDER — MORPHINE SULFATE (PF) 4 MG/ML IV SOLN
4.0000 mg | Freq: Once | INTRAVENOUS | Status: AC
  Administered 2023-06-23: 4 mg via INTRAVENOUS
  Filled 2023-06-23: qty 1

## 2023-06-23 MED ORDER — KETOROLAC TROMETHAMINE 30 MG/ML IJ SOLN
15.0000 mg | Freq: Once | INTRAMUSCULAR | Status: AC
  Administered 2023-06-23: 15 mg via INTRAVENOUS
  Filled 2023-06-23: qty 1

## 2023-06-23 MED ORDER — ONDANSETRON HCL 4 MG/2ML IJ SOLN
4.0000 mg | Freq: Once | INTRAMUSCULAR | Status: AC
  Administered 2023-06-23: 4 mg via INTRAVENOUS
  Filled 2023-06-23: qty 2

## 2023-06-23 MED ORDER — ACETAMINOPHEN 500 MG PO TABS
1000.0000 mg | ORAL_TABLET | Freq: Once | ORAL | Status: AC
  Administered 2023-06-23: 1000 mg via ORAL
  Filled 2023-06-23: qty 2

## 2023-06-23 MED ORDER — SULFAMETHOXAZOLE-TRIMETHOPRIM 800-160 MG PO TABS
1.0000 | ORAL_TABLET | Freq: Two times a day (BID) | ORAL | 0 refills | Status: DC
Start: 2023-06-23 — End: 2023-08-03

## 2023-06-23 MED ORDER — POLYETHYLENE GLYCOL 3350 17 GM/SCOOP PO POWD: 17.0000 g | Freq: Every day | ORAL | 0 refills | Status: AC

## 2023-06-23 MED ORDER — IOHEXOL 300 MG/ML  SOLN
100.0000 mL | Freq: Once | INTRAMUSCULAR | Status: AC | PRN
  Administered 2023-06-23: 100 mL via INTRAVENOUS

## 2023-06-23 MED ORDER — SODIUM CHLORIDE 0.9 % IV BOLUS
500.0000 mL | Freq: Once | INTRAVENOUS | Status: AC
  Administered 2023-06-23: 500 mL via INTRAVENOUS

## 2023-06-23 NOTE — ED Notes (Signed)
Attempted PIV x2 without success

## 2023-06-23 NOTE — Discharge Instructions (Signed)
You have been seen in the emergency department for abdominal pain.  Your workup shows a possible urinary infection.  Please take your antibiotic as prescribed for the next 3 days.  Your imaging shows moderate constipation.  Please use MiraLAX once or twice daily with a large glass of water until symptoms resolve.  Please follow-up with your doctor in the next several days for recheck/reevaluation.  Return to the emergency department for any worsening pain or any other symptom personally concerning to yourself.

## 2023-06-23 NOTE — ED Notes (Signed)
Called lab to add on urine pregnancy test 

## 2023-06-23 NOTE — ED Provider Notes (Signed)
.-----------------------------------------   3:07 PM on 06/23/2023 -----------------------------------------  Blood pressure 131/76, pulse (!) 52, temperature 98.4 F (36.9 C), temperature source Oral, resp. rate 16, height 6\' 1"  (1.854 m), weight 113.4 kg, SpO2 100%.  Assuming care from Dr. Lenard Lance .  In short, Maria Fuller is a 38 y.o. female with a chief complaint of Abdominal Pain .  Refer to the original H&P for additional details.  The current plan of care is to follow-up CT, patient has UTI, was prescribed Bactrim as well as Zofran by prior physician if CT negative, able to be discharged home..  CT imaging on my interpretation without obvious free air or obstruction.  Did see evidence of infection concerning for UTI.  Also noted incidental left adnexal cystic structure, incidental findings discussed with the patient.  Instructed her to follow-up with primary care doctor for further management of the incidental findings on exam.  She was already prescribed for antibiotics and Zofran outpatient.  Will give her some Tylenol and Toradol prior to her going.  Work note was already done.  Shared decision making done with patient and she is agreeable with plan for discharge.  Instructed her to follow-up with primary care doctor outpatient, otherwise considered but no indication for inpatient admission at this time, she is safe for outpatient management.  Discharge strict return precautions.  Clinical Course as of 06/23/23 1631  Tue Jun 23, 2023  1625 CT ABDOMEN PELVIS W CONTRAST IMPRESSION: 1. Focus of intraluminal air within the nondependent aspect of the bladder, which in the absence of a history of recent instrumentation is concerning for infection. Recommend correlation with urinalysis. 2. A 4.5 cm left adnexal cystic structure could represent a hemorrhagic ovarian cyst. Consider further evaluation with short interval follow-up ultrasound. 3. Additional unchanged ancillary findings,  as described above.  [TT]    Clinical Course User Index [TT] Claybon Jabs, MD      Claybon Jabs, MD 06/23/23 [phone]

## 2023-06-23 NOTE — ED Provider Notes (Signed)
[facility] Provider Note    Event Date/Time   First MD Initiated Contact with Patient 06/23/23 1134     (approximate)  History   Chief Complaint: Abdominal Pain  HPI  Maria Fuller is a 38 y.o. female with a past medical history of anemia, diabetes, hypertension, prior gastric bypass/duodenal switch status post multiple bowel obstructions who presents to the emergency department for abdominal pain.  According to the patient she has had 3 bowel obstructions that have required surgical operations.  Patient states she woke early this morning with abdominal pain to the lower abdomen had a small episode of diarrhea.  States she hoped the abdominal pain will go away but has continued to worsen throughout the morning.  States she has been told when she has abdominal pain she will need abdominal imaging to rule out bowel obstructions given her prior history so she came to the emergency department for evaluation.  Patient states moderate discomfort.  States nausea this morning but denies any vomiting.  No urinary symptoms.  Physical Exam   Triage Vital Signs: ED Triage Vitals [06/23/23 0904]  Encounter Vitals Group     BP (!) 158/93     Systolic BP Percentile      Diastolic BP Percentile      Pulse Rate (!) 54     Resp 18     Temp 98.1 F (36.7 C)     Temp src      SpO2 98 %     Weight 250 lb (113.4 kg)     Height 6\' 1"  (1.854 m)     Head Circumference      Peak Flow      Pain Score 5     Pain Loc      Pain Education      Exclude from Growth Chart     Most recent vital signs: Vitals:   06/23/23 0904  BP: (!) 158/93  Pulse: (!) 54  Resp: 18  Temp: 98.1 F (36.7 C)  SpO2: 98%    General: Awake, no distress.  CV:  Good peripheral perfusion.  Regular rate and rhythm  Resp:  Normal effort.  Equal breath sounds bilaterally.  Abd:  No distention.  Soft, states lower abdominal pain/tenderness.  ED Results / Procedures / Treatments   RADIOLOGY  I  have reviewed and interpreted the x-ray images.  No obvious air-fluid levels or signs of obstruction seen on my evaluation. Radiology is read the x-ray is consistent with moderate stool burden.   MEDICATIONS ORDERED IN ED: Medications  morphine (PF) 4 MG/ML injection 4 mg (has no administration in time range)  ondansetron (ZOFRAN) injection 4 mg (has no administration in time range)  sodium chloride 0.9 % bolus 500 mL (has no administration in time range)     IMPRESSION / MDM / ASSESSMENT AND PLAN / ED COURSE  I reviewed the triage vital signs and the nursing notes.  Patient's presentation is most consistent with acute presentation with potential threat to life or bodily function.  Patient presents to the emergency department for abdominal pain.  History of bypass duodenal switch multiple bowel obstructions in the past.  Patient's workup shows a normal CBC, reassuring chemistry including normal LFTs, normal lipase.  Urinalysis does show 50 white cells although rare bacteria we will send a urine culture patient denies any urinary symptoms.  X-ray does show constipation which could be the cause of the patient's pain however I discussed CT imaging, patient  wishes to proceed with CT imaging to rule out bowel obstruction which I believe is reasonable given the patient's history.  Patient's workup in the emergency department shows possible urinary tract infection with greater than 50 white cells we will cover with Keflex.  Urine culture has been sent.  Patient's pregnancy test is negative, CBC shows no concerning findings with normal white blood cell count chemistry LFTs and lipase are normal.  Also shows moderate constipation on mental x-ray.  CT scan of the abdomen/pelvis is pending.  Patient care signed out to oncoming provider.  FINAL CLINICAL IMPRESSION(S) / ED DIAGNOSES   Abdominal pain   Note:  This document was prepared using Dragon voice recognition software and may include unintentional  dictation errors.   Minna Antis, MD 06/23/23 1455

## 2023-06-23 NOTE — ED Triage Notes (Signed)
Pt to ED for abd pain started this am. Dx with COVID on Friday. Report hx of bowel obstruction and told to come to ED for imaging whenever having this pain.

## 2023-06-25 LAB — URINE CULTURE: Culture: >=100000 — AB

## 2023-07-02 ENCOUNTER — Encounter: Payer: Self-pay | Admitting: Urology

## 2023-07-02 ENCOUNTER — Ambulatory Visit: Payer: 59 | Admitting: Physician Assistant

## 2023-08-02 ENCOUNTER — Observation Stay
Admission: EM | Admit: 2023-08-02 | Discharge: 2023-08-04 | Disposition: A | Discharge status: Home / Self Care | Attending: Internal Medicine | Admitting: Internal Medicine

## 2023-08-02 ENCOUNTER — Other Ambulatory Visit: Payer: Self-pay

## 2023-08-02 ENCOUNTER — Emergency Department

## 2023-08-02 DIAGNOSIS — R109 Unspecified abdominal pain: Secondary | ICD-10-CM | POA: Diagnosis present

## 2023-08-02 DIAGNOSIS — E669 Obesity, unspecified: Secondary | ICD-10-CM | POA: Diagnosis not present

## 2023-08-02 DIAGNOSIS — Z79899 Other long term (current) drug therapy: Secondary | ICD-10-CM | POA: Diagnosis not present

## 2023-08-02 DIAGNOSIS — K56609 Unspecified intestinal obstruction, unspecified as to partial versus complete obstruction: Principal | ICD-10-CM

## 2023-08-02 DIAGNOSIS — E785 Hyperlipidemia, unspecified: Secondary | ICD-10-CM

## 2023-08-02 DIAGNOSIS — F331 Major depressive disorder, recurrent, moderate: Secondary | ICD-10-CM | POA: Diagnosis not present

## 2023-08-02 DIAGNOSIS — G4733 Obstructive sleep apnea (adult) (pediatric): Secondary | ICD-10-CM | POA: Diagnosis present

## 2023-08-02 DIAGNOSIS — E119 Type 2 diabetes mellitus without complications: Secondary | ICD-10-CM | POA: Insufficient documentation

## 2023-08-02 DIAGNOSIS — Z8639 Personal history of other endocrine, nutritional and metabolic disease: Secondary | ICD-10-CM

## 2023-08-02 DIAGNOSIS — I10 Essential (primary) hypertension: Secondary | ICD-10-CM | POA: Diagnosis present

## 2023-08-02 DIAGNOSIS — F32A Depression, unspecified: Secondary | ICD-10-CM

## 2023-08-02 LAB — CBG MONITORING, ED: Glucose-Capillary: 86 mg/dL (ref 70–99)

## 2023-08-02 LAB — URINALYSIS, ROUTINE W REFLEX MICROSCOPIC
Bilirubin Urine: NEGATIVE
Glucose, UA: NEGATIVE mg/dL
Hgb urine dipstick: NEGATIVE
Ketones, ur: NEGATIVE mg/dL
Leukocytes,Ua: NEGATIVE
Nitrite: NEGATIVE
Protein, ur: NEGATIVE mg/dL
Specific Gravity, Urine: 1.015 (ref 1.005–1.030)
pH: 5 (ref 5.0–8.0)

## 2023-08-02 LAB — COMPREHENSIVE METABOLIC PANEL WITH GFR
ALT: 22 U/L (ref 0–44)
AST: 19 U/L (ref 15–41)
Albumin: 3.9 g/dL (ref 3.5–5.0)
Alkaline Phosphatase: 90 U/L (ref 38–126)
Anion gap: 5 (ref 5–15)
BUN: 14 mg/dL (ref 6–20)
CO2: 23 mmol/L (ref 22–32)
Calcium: 8.4 mg/dL — ABNORMAL LOW (ref 8.9–10.3)
Chloride: 109 mmol/L (ref 98–111)
Creatinine, Ser: 0.46 mg/dL (ref 0.44–1.00)
GFR, Estimated: >60 mL/min (ref >60)
Glucose, Bld: 80 mg/dL (ref 70–99)
Potassium: 3.8 mmol/L (ref 3.5–5.1)
Sodium: 137 mmol/L (ref 135–145)
Total Bilirubin: 0.7 mg/dL (ref 0.0–1.2)
Total Protein: 7.1 g/dL (ref 6.5–8.1)

## 2023-08-02 LAB — CBC
HCT: 34.5 % — ABNORMAL LOW (ref 36.0–46.0)
Hemoglobin: 11.2 g/dL — ABNORMAL LOW (ref 12.0–15.0)
MCH: 30.8 pg (ref 26.0–34.0)
MCHC: 32.5 g/dL (ref 30.0–36.0)
MCV: 94.8 fL (ref 80.0–100.0)
Platelets: 332 10*3/uL (ref 150–400)
RBC: 3.64 MIL/uL — ABNORMAL LOW (ref 3.87–5.11)
RDW: 12.9 % (ref 11.5–15.5)
WBC: 6.7 10*3/uL (ref 4.0–10.5)
nRBC: 0 % (ref 0.0–0.2)

## 2023-08-02 LAB — LIPASE, BLOOD: Lipase: 27 U/L (ref 11–51)

## 2023-08-02 LAB — PREGNANCY, URINE: Preg Test, Ur: NEGATIVE

## 2023-08-02 MED ORDER — MORPHINE SULFATE (PF) 4 MG/ML IV SOLN
4.0000 mg | INTRAVENOUS | Status: DC | PRN
  Administered 2023-08-02 – 2023-08-03 (×4): 4 mg via INTRAVENOUS
  Filled 2023-08-02 (×4): qty 1

## 2023-08-02 MED ORDER — MORPHINE SULFATE (PF) 4 MG/ML IV SOLN
4.0000 mg | Freq: Once | INTRAVENOUS | Status: AC
  Administered 2023-08-02: 4 mg via INTRAVENOUS
  Filled 2023-08-02: qty 1

## 2023-08-02 MED ORDER — ONDANSETRON HCL 4 MG/2ML IJ SOLN
4.0000 mg | Freq: Four times a day (QID) | INTRAMUSCULAR | Status: DC | PRN
  Administered 2023-08-03 (×2): 4 mg via INTRAVENOUS
  Filled 2023-08-02 (×2): qty 2

## 2023-08-02 MED ORDER — ONDANSETRON HCL 4 MG PO TABS: 4.0000 mg | ORAL_TABLET | Freq: Four times a day (QID) | ORAL | Status: DC | PRN

## 2023-08-02 MED ORDER — DEXTROSE-SODIUM CHLORIDE 5-0.45 % IV SOLN: INTRAVENOUS | Status: AC

## 2023-08-02 MED ORDER — IOHEXOL 300 MG/ML  SOLN
75.0000 mL | Freq: Once | INTRAMUSCULAR | Status: AC | PRN
  Administered 2023-08-02: 100 mL via INTRAVENOUS

## 2023-08-02 MED ORDER — ENOXAPARIN SODIUM 60 MG/0.6ML IJ SOSY
55.0000 mg | PREFILLED_SYRINGE | INTRAMUSCULAR | Status: DC
  Administered 2023-08-02 – 2023-08-03 (×2): 55 mg via SUBCUTANEOUS
  Filled 2023-08-02 (×2): qty 0.6

## 2023-08-02 MED ORDER — DIPHENHYDRAMINE HCL 50 MG/ML IJ SOLN
25.0000 mg | Freq: Four times a day (QID) | INTRAMUSCULAR | Status: DC | PRN
  Administered 2023-08-02 – 2023-08-03 (×2): 25 mg via INTRAVENOUS
  Filled 2023-08-02 (×2): qty 1

## 2023-08-02 MED ORDER — ONDANSETRON HCL 4 MG/2ML IJ SOLN
4.0000 mg | Freq: Once | INTRAMUSCULAR | Status: AC
  Administered 2023-08-02: 4 mg via INTRAVENOUS
  Filled 2023-08-02: qty 2

## 2023-08-02 MED ORDER — SODIUM CHLORIDE 0.9 % IV SOLN: INTRAVENOUS | Status: DC

## 2023-08-02 NOTE — ED Triage Notes (Signed)
 Pt sts that she has N/V with abd pain. Pt sts that she has a hx of SBO's.

## 2023-08-02 NOTE — Assessment & Plan Note (Signed)
 Last hemoglobin A1c in our computer was 5 on 10/01/22.

## 2023-08-02 NOTE — ED Notes (Signed)
 Pt started itching. Pt stated the last time she had Morphine, she itched, and it subsided relatively quickly. Per pt

## 2023-08-02 NOTE — ED Provider Notes (Signed)
 [facility] Provider Note    Event Date/Time   First MD Initiated Contact with Patient 08/02/23 1200     (approximate)   History   Abdominal Pain   HPI Maria Fuller is a 38 y.o. female with prior history of hernia repairs, small bowel obstructions presenting today for abdominal pain.  Patient states onset of symptoms 2 days ago initially on the left side of her abdomen.  Symptoms progressed last night into this morning with sharp abdominal pain more prominent on her right side and in the right lower region.  Has had associated nausea and vomiting.  Otherwise denies fever, chills, constipation, diarrhea.  Intermittent dysuria symptoms.  No hematuria.  Has had prior history of bowel obstructions and came to the ED to rule out out.  Prior history of cholecystectomy.     Physical Exam   Triage Vital Signs: ED Triage Vitals [08/02/23 1148]  Encounter Vitals Group     BP 137/89     Systolic BP Percentile      Diastolic BP Percentile      Pulse Rate 61     Resp 17     Temp 98.2 F (36.8 C)     Temp Source Oral     SpO2 100 %     Weight 250 lb (113.4 kg)     Height 6\' 1"  (1.854 m)     Head Circumference      Peak Flow      Pain Score 5     Pain Loc      Pain Education      Exclude from Growth Chart     Most recent vital signs: Vitals:   08/02/23 1148  BP: 137/89  Pulse: 61  Resp: 17  Temp: 98.2 F (36.8 C)  SpO2: 100%   Physical Exam: I have reviewed the vital signs and nursing notes. General: Awake, alert, no acute distress.  Nontoxic appearing. Head:  Atraumatic, normocephalic.   ENT:  EOM intact, PERRL. Oral mucosa is pink and moist with no lesions. Neck: Neck is supple with full range of motion, No meningeal signs. Cardiovascular:  RRR, No murmurs. Peripheral pulses palpable and equal bilaterally. Respiratory:  Symmetrical chest wall expansion.  No rhonchi, rales, or wheezes.  Good air movement throughout.  No use of accessory  muscles.   Musculoskeletal:  No cyanosis or edema. Moving extremities with full ROM Abdomen:  Soft, tenderness palpation most prominent in right upper quadrant and right lower quadrant, nondistended. Neuro:  GCS 15, moving all four extremities, interacting appropriately. Speech clear. Psych:  Calm, appropriate.   Skin:  Warm, dry, no rash.    ED Results / Procedures / Treatments   Labs (all labs ordered are listed, but only abnormal results are displayed) Labs Reviewed  COMPREHENSIVE METABOLIC PANEL WITH GFR - Abnormal; Notable for the following components:      Result Value   Calcium 8.4 (*)    All other components within normal limits  CBC - Abnormal; Notable for the following components:   RBC 3.64 (*)    Hemoglobin 11.2 (*)    HCT 34.5 (*)    All other components within normal limits  URINALYSIS, ROUTINE W REFLEX MICROSCOPIC - Abnormal; Notable for the following components:   Color, Urine YELLOW (*)    APPearance HAZY (*)    All other components within normal limits  LIPASE, BLOOD  PREGNANCY, URINE  POC URINE PREG, ED     EKG  RADIOLOGY Independently interpreted CT showing concerns for partial small bowel obstruction   PROCEDURES:  Critical Care performed: No  Procedures   MEDICATIONS ORDERED IN ED: Medications  morphine (PF) 4 MG/ML injection 4 mg (has no administration in time range)  ondansetron (ZOFRAN) injection 4 mg (4 mg Intravenous Given 08/02/23 1254)  morphine (PF) 4 MG/ML injection 4 mg (4 mg Intravenous Given 08/02/23 1253)  iohexol (OMNIPAQUE) 300 MG/ML solution 75 mL (100 mLs Intravenous Contrast Given 08/02/23 1308)     IMPRESSION / MDM / ASSESSMENT AND PLAN / ED COURSE  I reviewed the triage vital signs and the nursing notes.                              Differential diagnosis includes, but is not limited to, small bowel obstruction, appendicitis, ovarian cyst, enteritis, colitis, acute cystitis  Patient's presentation is most  consistent with acute complicated illness / injury requiring diagnostic workup.  Patient is a 38 year old female presenting today for sharp abdominal pain associate with nausea and vomiting.  Given prior history of hernia surgeries as well as previous small bowel obstruction with resection, will need to get CT to rule out further acute intra-abdominal pathology.  Patient given Zofran and morphine for symptomatic control.  Laboratory workup largely reassuring with normal CBC, CMP, lipase.  UA and pregnancy test negative.  CT abdomen/pelvis shows concern for partial small bowel obstruction.  Discussed case with general surgery given her history.  No indication for surgery at this time.  Patient not having any nausea at the moment so general surgery stated she does not immediately need an NG tube.  Recommends admission to hospitalist and will continue to follow along.  Patient admitted to hospitalist for further care.  Clinical Course as of 08/02/23 1421  Sun Aug 02, 2023  1415 General surgery recommends admission to medicine for monitoring.  No NG tube needed at this time if no nausea.  Patient denying any nausea but still having pain symptoms. [DW]    Clinical Course User Index [DW] Janith Lima, MD     FINAL CLINICAL IMPRESSION(S) / ED DIAGNOSES   Final diagnoses:  Small bowel obstruction (HCC)     Rx / DC Orders   ED Discharge Orders     None        Note:  This document was prepared using Dragon voice recognition software and may include unintentional dictation errors.   Janith Lima, MD 08/02/23 [phone]

## 2023-08-02 NOTE — Assessment & Plan Note (Addendum)
 Stable Continue home psychiatric regimen once NPO status resolves

## 2023-08-02 NOTE — ED Notes (Signed)
 Patient is complaining of generalized itching. Notified Dr.Duncan,new orders placed.

## 2023-08-02 NOTE — Assessment & Plan Note (Signed)
 CPAP.

## 2023-08-02 NOTE — Assessment & Plan Note (Addendum)
 Progressive lower abdominal pain over the past 1 to 2 days in setting of multiple abdominal surgeries status post gastric bypass CT imaging concerning for partial bowel small obstruction General surgery consulted-follow-up recommendations Pain control IV fluids Antiemetics NPO Deferring NG tube for now Monitor

## 2023-08-02 NOTE — Assessment & Plan Note (Signed)
 BP stable Titrate home regimen

## 2023-08-02 NOTE — H&P (Addendum)
 History and Physical    Patient: Maria Fuller ZOX:[id number] DOB: 09/12/1985 DOA: 08/02/2023 DOS: the patient was seen and examined on 08/02/2023 PCP: Gardiner Coins, PA-C  Patient coming from: Home  Chief Complaint:  Chief Complaint  Patient presents with   Abdominal Pain   HPI: Maria Fuller is a 38 y.o. female with medical history significant of hypertension, obesity status post gastric bypass, multiple abdominal surgeries, depression presenting with small bowel obstruction.  Patient reports progressive generalized abdominal pain with past 1 to 2 days.  Positive nausea vomiting with this.  Emesis nonbloody nonbilious.  Baseline history of small bowel obstructions in the past in the setting of prior abdominal surgeries.  Patient states she has had multiple abdominal surgeries after her gastric bypass around 2016.  Also reports multiple episodes of bowel obstruction in the past. This is a similar presentation. Pain predominantly in the  the lower abdomen.   Minimal diarrhea.  Followed by the [facility] bariatric surgery/weight loss team per patient. Presented to the ER afebrile, hemodynamically stable.  Satting well on room air.  White count 6.7, hemoglobin 11.2, platelets 332, creatinine 0.46.  LFTs and T. bili within normal limits.  CT scan showing concerns for partial small bowel obstruction in the right hemiabdomen and the anastomotic small bowel.  Noted prior cholecystectomy with stable common bile duct dilatation. Review of Systems: As mentioned in the history of present illness. All other systems reviewed and are negative. Past Medical History:  Diagnosis Date   Anemia    slight   Bradycardia    Depression    Diabetes mellitus without complication (HCC)    resolved with weight loss from gastric bypass   Headache    migraines. resolved since gastric bypass   High cholesterol    resolved since weight loss surgery   History of kidney stones    last week   Hypertension     resolved since gastric bypass, severe pre-eclampsia with G1   Incarcerated incisional hernia 07/27/2021   Low BP    Meningitis 2015   Partial small bowel obstruction (HCC) 08/04/2019   PCOS (polycystic ovarian syndrome)    Preterm labor    Recurrent ventral incisional hernia    Sleep apnea    had gastric bypass surgery and now resolved   Syncope    Vertigo couple years ago   Past Surgical History:  Procedure Laterality Date   BIOPSY  11/26/2022   Procedure: BIOPSY;  Surgeon: Toney Reil, MD;  Location: [facility] ENDOSCOPY;  Service: Gastroenterology;;   CESAREAN SECTION     CESAREAN SECTION N/A 05/07/2016   Procedure: CESAREAN SECTION;  Surgeon: Nadara Mustard, MD;  Location: [facility] ORS;  Service: Obstetrics;  Laterality: N/A;  Time of birth: [birth year] Sex: Female Weight: 3940 kg, 8 lb 11 oz   CHOLECYSTECTOMY     COLON SURGERY     COLONOSCOPY WITH PROPOFOL N/A 03/16/2023   Procedure: COLONOSCOPY WITH PROPOFOL;  Surgeon: Regis Bill, MD;  Location: [facility] ENDOSCOPY;  Service: Endoscopy;  Laterality: N/A;   CYSTOSCOPY WITH STENT PLACEMENT Right 09/19/2016   Procedure: CYSTOSCOPY WITH STENT PLACEMENT;  Surgeon: Hildred Laser, MD;  Location: [facility] ORS;  Service: Urology;  Laterality: Right;   CYSTOSCOPY WITH STENT PLACEMENT Left 04/18/2022   Procedure: CYSTOSCOPY WITH STENT PLACEMENT;  Surgeon: Bjorn Pippin, MD;  Location: [facility] ORS;  Service: Urology;  Laterality: Left;   CYSTOSCOPY/URETEROSCOPY/HOLMIUM LASER/STENT PLACEMENT Left 05/08/2022   Procedure: CYSTOSCOPY/URETEROSCOPY/HOLMIUM LASER/STENT EXCHANGE;  Surgeon: Vanna Scotland,  MD;  Location: [facility] ORS;  Service: Urology;  Laterality: Left;   DIAGNOSTIC LAPAROSCOPY     ESOPHAGOGASTRODUODENOSCOPY (EGD) WITH PROPOFOL N/A 11/23/2022   Procedure: ESOPHAGOGASTRODUODENOSCOPY (EGD) WITH PROPOFOL;  Surgeon: Wyline Mood, MD;  Location: [facility] ENDOSCOPY;  Service: Gastroenterology;  Laterality: N/A;   ESOPHAGOGASTRODUODENOSCOPY (EGD)  WITH PROPOFOL N/A 11/26/2022   Procedure: ESOPHAGOGASTRODUODENOSCOPY (EGD) WITH PROPOFOL;  Surgeon: Toney Reil, MD;  Location: [facility] ENDOSCOPY;  Service: Gastroenterology;  Laterality: N/A;   EXTRACORPOREAL SHOCK WAVE LITHOTRIPSY Left 05/15/2022   Procedure: EXTRACORPOREAL SHOCK WAVE LITHOTRIPSY (ESWL);  Surgeon: Vanna Scotland, MD;  Location: [facility] ORS;  Service: Urology;  Laterality: Left;   FLEXIBLE SIGMOIDOSCOPY N/A 07/28/2019   Procedure: FLEXIBLE SIGMOIDOSCOPY;  Surgeon: Toney Reil, MD;  Location: [facility];  Service: Endoscopy;  Laterality: N/A;   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   HERNIA REPAIR     INSERTION OF MESH N/A 07/07/2022   Procedure: INSERTION OF MESH;  Surgeon: Campbell Lerner, MD;  Location: [facility] ORS;  Service: General;  Laterality: N/A;   LAPAROSCOPIC OVARIAN CYSTECTOMY Right 09/28/2018   Procedure: LAPAROSCOPIC RIGHT OVARIAN CYSTECTOMY;  Surgeon: Nadara Mustard, MD;  Location: [facility] OR[facility];  Service: Gynecology;  Laterality: Right;   LAPAROSCOPIC UNILATERAL SALPINGO OOPHERECTOMY Right 07/25/2021   Procedure: LAPAROSCOPIC UNILATERAL SALPINGO OOPHORECTOMY;  Surgeon: Nadara Mustard, MD;  Location: [facility] OR[facility];  Service: Gynecology;  Laterality: Right;   LEFT HEART CATH AND CORONARY ANGIOGRAPHY Left 04/07/2018   Procedure: LEFT HEART CATH AND CORONARY ANGIOGRAPHY;  Surgeon: Alwyn Pea, MD;  Location: [facility];  Service: Cardiovascular;  Laterality: Left;   OOPHORECTOMY     POLYPECTOMY  03/16/2023   Procedure: POLYPECTOMY;  Surgeon: Regis Bill, MD;  Location: [facility] ENDOSCOPY;  Service: Endoscopy;;   small bowel obstruction  07/2019   TONSILECTOMY, ADENOIDECTOMY, BILATERAL MYRINGOTOMY AND TUBES     TONSILLECTOMY     URETEROSCOPY WITH HOLMIUM LASER LITHOTRIPSY Right 09/19/2016   Procedure: URETEROSCOPY WITH HOLMIUM LASER LITHOTRIPSY;  Surgeon: Hildred Laser, MD;  Location: [facility] ORS;  Service: Urology;   Laterality: Right;   VENTRAL HERNIA REPAIR     scheduled for 07/07/2022   XI ROBOTIC ASSISTED VENTRAL HERNIA N/A 07/27/2021   Procedure: XI ROBOTIC ASSISTED INCARCERATED INCISIONAL HERNIA REPAIR;  Surgeon: Campbell Lerner, MD;  Location: [facility] ORS;  Service: General;  Laterality: N/A;   XI ROBOTIC ASSISTED VENTRAL HERNIA N/A 07/07/2022   Procedure: XI ROBOTIC ASSISTED VENTRAL HERNIA, recurrent incisional with mesh;  Surgeon: Campbell Lerner, MD;  Location: [facility] ORS;  Service: General;  Laterality: N/A;   Social History:  reports that she has never smoked. She has never been exposed to tobacco smoke. She has never used smokeless tobacco. She reports that she does not drink alcohol and does not use drugs.  Allergies  Allergen Reactions   Compazine [Prochlorperazine] Other (See Comments)    Panic attack   Ceftin [Cefuroxime Axetil] Rash    Tolerated cefazolin before   Penicillins Rash    TOLERATED CEFAZOLIN BEFORE     Family History  Problem Relation Age of Onset   Polycystic ovary syndrome Mother    Diabetes Father    Bladder Cancer Father    Depression Father    Endometriosis Sister    Polycystic ovary syndrome Sister    Breast cancer Paternal Grandmother    Diabetes Paternal Grandmother    Diabetes Paternal Grandfather    Kidney cancer Neg Hx    Prostate cancer Neg  Hx     Prior to Admission medications   Medication Sig Start Date End Date Taking? Authorizing Provider  buPROPion (WELLBUTRIN XL) 150 MG 24 hr tablet Take 150 mg by mouth every morning. 06/17/21   [provider]  dicyclomine (BENTYL) 10 MG capsule Take 1 capsule (10 mg total) by mouth 4 (four) times daily -  before meals and at bedtime. 08/17/22   Jene Every, MD  gabapentin (NEURONTIN) 300 MG capsule Take 300 mg by mouth 3 (three) times daily.    [provider]  hydrOXYzine (ATARAX) 25 MG tablet Take 1 tablet by mouth 3 (three) times daily as needed. 12/17/22   [provider]   levonorgestrel (MIRENA) 20 MCG/24HR IUD 1 each by Intrauterine route once.    [provider]  ondansetron (ZOFRAN-ODT) 4 MG disintegrating tablet Take 1 tablet (4 mg total) by mouth every 8 (eight) hours as needed for nausea or vomiting. 08/17/22   Jene Every, MD  pantoprazole (PROTONIX) 40 MG tablet Take 1 tablet (40 mg total) by mouth 2 (two) times daily before a meal. 11/26/22   Sunnie Nielsen, DO  polyethylene glycol powder (GLYCOLAX/MIRALAX) 17 GM/SCOOP powder Take 17 g by mouth daily. 06/23/23   Minna Antis, MD  scopolamine (TRANSDERM-SCOP) 1 MG/3DAYS 1 patch every three (3) days as needed. 08/11/22   [provider]  sertraline (ZOLOFT) 100 MG tablet Take 100 mg by mouth 2 (two) times daily.    [provider]  sulfamethoxazole-trimethoprim (BACTRIM DS) 800-160 MG tablet Take 1 tablet by mouth 2 (two) times daily. 06/23/23   Minna Antis, MD  traZODone (DESYREL) 50 MG tablet Take 50 mg by mouth at bedtime.  09/06/18   [provider]    Physical Exam: Vitals:   08/02/23 1445 08/02/23 1500 08/02/23 1530 08/02/23 1625  BP: 126/74 122/70 130/81   Pulse: (!) 58 (!) 55 (!) 57   Resp:  13 10   Temp:    98.5 F (36.9 C)  TempSrc:    Oral  SpO2: 100% 98% 98%   Weight:      Height:       Physical Exam Constitutional:      Appearance: She is obese.  HENT:     Head: Normocephalic and atraumatic.     Nose: Nose normal.  Eyes:     Pupils: Pupils are equal, round, and reactive to light.  Cardiovascular:     Rate and Rhythm: Normal rate and regular rhythm.  Pulmonary:     Effort: Pulmonary effort is normal.  Abdominal:     General: Bowel sounds are normal.     Comments: Positive mild right lower quadrant abdominal tenderness palpation  Musculoskeletal:        General: Normal range of motion.  Skin:    General: Skin is warm.  Neurological:     General: No focal deficit present.  Psychiatric:        Mood and Affect: Mood normal.      Data Reviewed:  There are no new results to review at this time.  CT ABDOMEN PELVIS W CONTRAST CLINICAL DATA:  Abdominal pain which initially began in the left upper quadrant but now involves the right upper and lower quadrant. Evaluate for SBO.  EXAM: CT ABDOMEN AND PELVIS WITH CONTRAST  TECHNIQUE: Multidetector CT imaging of the abdomen and pelvis was performed using the standard protocol following bolus administration of intravenous contrast.  RADIATION DOSE REDUCTION: This exam was performed according to the departmental  dose-optimization program which includes automated exposure control, adjustment of the mA and/or kV according to patient size and/or use of iterative reconstruction technique.  CONTRAST:  OMNIPAQUE IOHEXOL 300 MG/ML  SOLN  COMPARISON:  06/23/2023  FINDINGS: Lower chest: No pleural fluid or consolidative change. Subsegmental atelectasis and ground-glass attenuation noted in the posterior lung bases.  Hepatobiliary: No focal liver abnormality. Cholecystectomy. Common bile duct measures up to 9 mm, image 42/5. This appears unchanged from previous exam. No calcified stones noted along the course of the CBD.  Pancreas: Unremarkable. No pancreatic ductal dilatation or surrounding inflammatory changes.  Spleen: Normal in size without focal abnormality.  Adrenals/Urinary Tract: Normal adrenal glands. No nephrolithiasis, hydronephrosis or mass identified. Cyst within the upper pole of left kidney measures 1.2 cm, image 28/2. No follow-up imaging recommended. Urinary bladder appears normal.  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 Complex small bowel anatomy is noted with multiple areas of small bowel anastomosis noted.  Within the right hemiabdomen there is a dilated loop of small bowel with anastomotic changes measuring up to 5.2 cm in diameter. On the previous exam this loop was nondilated. There is a  decrease caliber of the small bowel distal to the dilated anastomotic small bowel loop, image 43/5. Imaging findings are compatible with at least a partial small bowel obstruction associated with the anastomotic small bowel in the right hemiabdomen. This may reflect underlying anastomotic stricture or adhesive disease.  The appendix is visualized and appears normal. No pathologic dilatation of the colon. No abnormal bowel wall thickening or inflammatory changes identified.  Vascular/Lymphatic: Normal appearance of the abdominal aorta. No signs of abdominopelvic adenopathy.  Reproductive: IUD is noted within the uterus.  No adnexal mass.  Other: There is no free fluid or fluid collections. No signs of pneumoperitoneum.  Musculoskeletal: No acute or suspicious osseous findings. 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  IMPRESSION: 1. 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 Complex small bowel anatomy is noted with multiple areas of small bowel anastomosis noted. Within the right hemiabdomen there is a dilated loop of small bowel with anastomotic changes measuring up to 5.2 cm in diameter. On the previous exam this loop was nondilated. There is a decrease caliber of the small bowel distal to the dilated anastomotic small bowel loop. Imaging findings are compatible with at least a partial small bowel obstruction associated with the anastomotic small bowel in the right hemiabdomen. This may reflect underlying anastomotic stricture or adhesive disease. 2. Cholecystectomy. Common bile duct measures up to 9 mm, unchanged from previous exam. No calcified stones noted along the course of the CBD.  Electronically Signed   By: Signa Kell M.D.   On: 08/02/2023 13:41  Lab Results  Component Value Date   WBC 6.7 08/02/2023   HGB 11.2 (L) 08/02/2023   HCT 34.5 (L) 08/02/2023   MCV 94.8 08/02/2023   PLT 332 08/02/2023   Last  metabolic panel Lab Results  Component Value Date   GLUCOSE 80 08/02/2023   NA 137 08/02/2023   K 3.8 08/02/2023   CL 109 08/02/2023   CO2 23 08/02/2023   BUN 14 08/02/2023   CREATININE 0.46 08/02/2023   GFRNONAA >60 08/02/2023   CALCIUM 8.4 (L) 08/02/2023   PROT 7.1 08/02/2023   ALBUMIN 3.9 08/02/2023   BILITOT 0.7 08/02/2023   ALKPHOS 90 08/02/2023   AST 19 08/02/2023   ALT 22 08/02/2023   ANIONGAP 5 08/02/2023  Assessment and Plan: * SBO (small bowel obstruction) (HCC) Progressive lower abdominal pain over the past 1 to 2 days in setting of multiple abdominal surgeries status post gastric bypass CT imaging concerning for partial bowel small obstruction General surgery consulted-follow-up recommendations Pain control IV fluids Antiemetics NPO Deferring NG tube for now Monitor  Major depressive disorder, recurrent, moderate (HCC) Stable Continue home psychiatric regimen once NPO status resolves    Obstructive sleep apnea CPAP  HTN (hypertension) BP stable Titrate home regimen  History of diabetes mellitus, type II Blood sugar in 80s Monitor      Advance Care Planning:   Code Status: Full Code   Consults: General surgery   Family Communication: No family at the bedside   Severity of Illness: The appropriate patient status for this patient is OBSERVATION. Observation status is judged to be reasonable and necessary in order to provide the required intensity of service to ensure the patient's safety. The patient's presenting symptoms, physical exam findings, and initial radiographic and laboratory data in the context of their medical condition is felt to place them at decreased risk for further clinical deterioration. Furthermore, it is anticipated that the patient will be medically stable for discharge from the hospital within 2 midnights of admission.   Author: Floydene Flock, MD 08/02/2023 4:31 PM  For on call review [email or web address].

## 2023-08-03 ENCOUNTER — Encounter: Payer: Self-pay | Admitting: Family Medicine

## 2023-08-03 DIAGNOSIS — G4733 Obstructive sleep apnea (adult) (pediatric): Secondary | ICD-10-CM | POA: Diagnosis not present

## 2023-08-03 DIAGNOSIS — K56600 Partial intestinal obstruction, unspecified as to cause: Secondary | ICD-10-CM | POA: Diagnosis not present

## 2023-08-03 DIAGNOSIS — Z8639 Personal history of other endocrine, nutritional and metabolic disease: Secondary | ICD-10-CM

## 2023-08-03 DIAGNOSIS — F331 Major depressive disorder, recurrent, moderate: Secondary | ICD-10-CM | POA: Diagnosis not present

## 2023-08-03 DIAGNOSIS — K56609 Unspecified intestinal obstruction, unspecified as to partial versus complete obstruction: Secondary | ICD-10-CM | POA: Diagnosis not present

## 2023-08-03 LAB — HIV ANTIBODY (ROUTINE TESTING W REFLEX): HIV Screen 4th Generation wRfx: NONREACTIVE

## 2023-08-03 LAB — COMPREHENSIVE METABOLIC PANEL WITH GFR
ALT: 48 U/L — ABNORMAL HIGH (ref 0–44)
AST: 114 U/L — ABNORMAL HIGH (ref 15–41)
Albumin: 3.4 g/dL — ABNORMAL LOW (ref 3.5–5.0)
Alkaline Phosphatase: 85 U/L (ref 38–126)
Anion gap: 5 (ref 5–15)
BUN: 12 mg/dL (ref 6–20)
CO2: 25 mmol/L (ref 22–32)
Calcium: 8.1 mg/dL — ABNORMAL LOW (ref 8.9–10.3)
Chloride: 107 mmol/L (ref 98–111)
Creatinine, Ser: 0.46 mg/dL (ref 0.44–1.00)
GFR, Estimated: >60 mL/min (ref >60)
Glucose, Bld: 86 mg/dL (ref 70–99)
Potassium: 4.1 mmol/L (ref 3.5–5.1)
Sodium: 137 mmol/L (ref 135–145)
Total Bilirubin: 0.7 mg/dL (ref 0.0–1.2)
Total Protein: 6.3 g/dL — ABNORMAL LOW (ref 6.5–8.1)

## 2023-08-03 LAB — CBC
HCT: 31.1 % — ABNORMAL LOW (ref 36.0–46.0)
Hemoglobin: 10.4 g/dL — ABNORMAL LOW (ref 12.0–15.0)
MCH: 31 pg (ref 26.0–34.0)
MCHC: 33.4 g/dL (ref 30.0–36.0)
MCV: 92.6 fL (ref 80.0–100.0)
Platelets: 286 10*3/uL (ref 150–400)
RBC: 3.36 MIL/uL — ABNORMAL LOW (ref 3.87–5.11)
RDW: 13 % (ref 11.5–15.5)
WBC: 4.7 10*3/uL (ref 4.0–10.5)
nRBC: 0 % (ref 0.0–0.2)

## 2023-08-03 MED ORDER — OXYCODONE HCL 5 MG PO TABS
5.0000 mg | ORAL_TABLET | ORAL | Status: DC | PRN
  Administered 2023-08-03 – 2023-08-04 (×3): 5 mg via ORAL
  Filled 2023-08-03 (×3): qty 1

## 2023-08-03 MED ORDER — TRAZODONE HCL 50 MG PO TABS
50.0000 mg | ORAL_TABLET | Freq: Every day | ORAL | Status: DC
  Administered 2023-08-03: 50 mg via ORAL
  Filled 2023-08-03: qty 1

## 2023-08-03 MED ORDER — SERTRALINE HCL 50 MG PO TABS
100.0000 mg | ORAL_TABLET | Freq: Two times a day (BID) | ORAL | Status: DC
  Administered 2023-08-03 – 2023-08-04 (×3): 100 mg via ORAL
  Filled 2023-08-03 (×3): qty 2

## 2023-08-03 MED ORDER — BUPROPION HCL ER (XL) 150 MG PO TB24
150.0000 mg | ORAL_TABLET | ORAL | Status: DC
  Administered 2023-08-03 – 2023-08-04 (×2): 150 mg via ORAL
  Filled 2023-08-03 (×2): qty 1

## 2023-08-03 MED ORDER — MORPHINE SULFATE (PF) 2 MG/ML IV SOLN: 1.0000 mg | INTRAVENOUS | Status: DC | PRN

## 2023-08-03 MED ORDER — PANTOPRAZOLE SODIUM 40 MG PO TBEC
40.0000 mg | DELAYED_RELEASE_TABLET | Freq: Two times a day (BID) | ORAL | Status: DC
  Administered 2023-08-03 – 2023-08-04 (×2): 40 mg via ORAL
  Filled 2023-08-03 (×2): qty 1

## 2023-08-03 NOTE — Progress Notes (Signed)
 [facility]  - [place] [facility]   PATIENT NAME: Maria Fuller    MR#:  [id number]  DATE OF BIRTH:  December 24, 1985  SUBJECTIVE:  working on her laptop. No family at bedside. Denies passing flatus. Tolerating clear liquid diet no vomiting. Ambulating to the bathroom. Last bowel movement was on Saturday. Some abdominal discomfort.    VITALS:  Blood pressure 115/71, pulse (!) 49, temperature 98 F (36.7 C), temperature source Oral, resp. rate 16, height 6\' 1"  (1.854 m), weight 113.4 kg, SpO2 99%.  PHYSICAL EXAMINATION:   GENERAL:  38 y.o.-year-old patient with no acute distress. Morbid obesity LUNGS: Normal breath sounds bilaterally, no wheezing CARDIOVASCULAR: S1, S2 normal. No murmur   ABDOMEN: Soft, mild diffuse tender, nondistended. Abdominal surgical scars present few bowel sounds present.  EXTREMITIES: No  edema b/l.    NEUROLOGIC: nonfocal  patient is alert and awake  LABORATORY PANEL:  CBC Recent Labs  Lab 08/03/23 0632  WBC 4.7  HGB 10.4*  HCT 31.1*  PLT 286    Chemistries  Recent Labs  Lab 08/03/23 0632  NA 137  K 4.1  CL 107  CO2 25  GLUCOSE 86  BUN 12  CREATININE 0.46  CALCIUM 8.1*  AST 114*  ALT 48*  ALKPHOS 85  BILITOT 0.7    RADIOLOGY:  CT ABDOMEN PELVIS W CONTRAST Result Date: 08/02/2023 CLINICAL DATA:  Abdominal pain which initially began in the left upper quadrant but now involves the right upper and lower quadrant. Evaluate for SBO. EXAM: CT ABDOMEN AND PELVIS WITH CONTRAST TECHNIQUE: Multidetector CT imaging of the abdomen and pelvis was performed using the standard protocol following bolus administration of intravenous contrast. RADIATION DOSE REDUCTION: This exam was performed according to the departmental dose-optimization program which includes automated exposure control, adjustment of the mA and/or kV according to patient size and/or use of iterative reconstruction technique. CONTRAST:  OMNIPAQUE IOHEXOL 300 MG/ML   SOLN COMPARISON:  06/23/2023 FINDINGS: Lower chest: No pleural fluid or consolidative change. Subsegmental atelectasis and ground-glass attenuation noted in the posterior lung bases. Hepatobiliary: No focal liver abnormality. Cholecystectomy. Common bile duct measures up to 9 mm, image 42/5. This appears unchanged from previous exam. No calcified stones noted along the course of the CBD. Pancreas: Unremarkable. No pancreatic ductal dilatation or surrounding inflammatory changes. Spleen: Normal in size without focal abnormality. Adrenals/Urinary Tract: Normal adrenal glands. No nephrolithiasis, hydronephrosis or mass identified. Cyst within the upper pole of left kidney measures 1.2 cm, image 28/2. No follow-up imaging recommended. Urinary bladder appears normal. 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 Complex small bowel anatomy is noted with multiple areas of small bowel anastomosis noted. Within the right hemiabdomen there is a dilated loop of small bowel with anastomotic changes measuring up to 5.2 cm in diameter. On the previous exam this loop was nondilated. There is a decrease caliber of the small bowel distal to the dilated anastomotic small bowel loop, image 43/5. Imaging findings are compatible with at least a partial small bowel obstruction associated with the anastomotic small bowel in the right hemiabdomen. This may reflect underlying anastomotic stricture or adhesive disease. The appendix is visualized and appears normal. No pathologic dilatation of the colon. No abnormal bowel wall thickening or inflammatory changes identified. Vascular/Lymphatic: Normal appearance of the abdominal aorta. No signs of abdominopelvic adenopathy. Reproductive: IUD is noted within the uterus.  No adnexal mass. Other: There is no free fluid or fluid collections. No signs of pneumoperitoneum. Musculoskeletal: No  acute or suspicious osseous findings. 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 IMPRESSION: 1. 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 Complex small bowel anatomy is noted with multiple areas of small bowel anastomosis noted. Within the right hemiabdomen there is a dilated loop of small bowel with anastomotic changes measuring up to 5.2 cm in diameter. On the previous exam this loop was nondilated. There is a decrease caliber of the small bowel distal to the dilated anastomotic small bowel loop. Imaging findings are compatible with at least a partial small bowel obstruction associated with the anastomotic small bowel in the right hemiabdomen. This may reflect underlying anastomotic stricture or adhesive disease. 2. Cholecystectomy. Common bile duct measures up to 9 mm, unchanged from previous exam. No calcified stones noted along the course of the CBD. Electronically Signed   By: Signa Kell M.D.   On: 08/02/2023 13:41    Assessment and Plan  [name] is a 38 y.o. female with medical history significant of hypertension, obesity status post gastric bypass, multiple abdominal surgeries, depression presenting with small bowel obstruction.  Patient reports progressive generalized abdominal pain with past 1 to 2 days.  Positive nausea vomiting with this.  Emesis nonbloody nonbilious.  Baseline history of small bowel obstructions in the past in the setting of prior abdominal surgeries   Followed by the [facility] bariatric surgery/weight loss team per patient.   CT scan abdomen showing concerns for partial small bowel obstruction in the right hemiabdomen and the anastomotic small bowel. Noted prior cholecystectomy with stable common bile duct dilatation.   SBO (small bowel obstruction) (HCC) --Progressive lower abdominal pain over the past 1 to 2 days in setting of multiple abdominal surgeries status post gastric bypass --CT imaging concerning for partial bowel small obstruction --General surgery consulted-follow-up  recommendations--started CLD --Pain control--IV and po --IV fluids --Antiemetics --pt advised to ambulate   Major depressive disorder, recurrent, moderate (HCC) --Stable --Continue home psychiatric regimen   Obstructive sleep apnea/Obesity --CPAP   HTN (hypertension) --BP stable --not  on any meds at home   History of diabetes mellitus, type II --Blood sugar in 80s --not on meds          Procedures: Family communication :none Consults : general surgery CODE STATUS: full DVT Prophylaxis : ambulation, enoxaparin Level of care: Med-Surg Status is: Observation The patient remains OBS appropriate and will d/c before 2 midnights.    TOTAL TIME TAKING CARE OF THIS PATIENT: 50 minutes.  >50% time spent on counselling and coordination of care  Note: This dictation was prepared with Dragon dictation along with smaller phrase technology. Any transcriptional errors that result from this process are unintentional.  Enedina Finner M.D    [facility]   CC: Primary care physician; Gardiner Coins, PA-C

## 2023-08-03 NOTE — Consult Note (Addendum)
 [facility] SURGICAL CONSULTATION NOTE (initial) - cpt: 16109   HISTORY OF PRESENT ILLNESS (HPI):  38 y.o. female presented to [facility] ED yesterday for evaluation of abdominal pain. Patient reports she has had progressive lower abdominal discomfort over the course of the last 2-3 days, worse in right lower abdomen. This has been accompanied with nausea and an episode of emesis. She did report loose stool over the weekend. Subjective fever. No CP, SOB, urinary changes. Previous surgeries positive for Duodenal Switch procedure and ventral hernia repair. Work up in the ED revealed normal WBC to 6.7K, Hgb to 11.2, sCr - 0.46. CT Abdomen/Pelvis was obtained and concerning for partial SBO. She was admitted to medicine service. NGT was deferred given lack of emesis.   Surgery is consulted by emergency medicine physician Dr. Claudell Kyle, MD in this context for evaluation and management of pSBO.   PAST MEDICAL HISTORY (PMH):  Past Medical History:  Diagnosis Date   Anemia    slight   Bradycardia    Depression    Diabetes mellitus without complication (HCC)    resolved with weight loss from gastric bypass   Headache    migraines. resolved since gastric bypass   High cholesterol    resolved since weight loss surgery   History of kidney stones    last week   Hypertension    resolved since gastric bypass, severe pre-eclampsia with G1   Incarcerated incisional hernia 07/27/2021   Low BP    Meningitis 2015   Partial small bowel obstruction (HCC) 08/04/2019   PCOS (polycystic ovarian syndrome)    Preterm labor    Recurrent ventral incisional hernia    Sleep apnea    had gastric bypass surgery and now resolved   Syncope    Vertigo couple years ago     PAST SURGICAL HISTORY (PSH):  Past Surgical History:  Procedure Laterality Date   BIOPSY  11/26/2022   Procedure: BIOPSY;  Surgeon: Toney Reil, MD;  Location: [facility] ENDOSCOPY;  Service: Gastroenterology;;   CESAREAN  SECTION     CESAREAN SECTION N/A 05/07/2016   Procedure: CESAREAN SECTION;  Surgeon: Nadara Mustard, MD;  Location: [facility] ORS;  Service: Obstetrics;  Laterality: N/A;  Time of birth: [birth year] Sex: Female Weight: 3940 kg, 8 lb 11 oz   CHOLECYSTECTOMY     COLON SURGERY     COLONOSCOPY WITH PROPOFOL N/A 03/16/2023   Procedure: COLONOSCOPY WITH PROPOFOL;  Surgeon: Regis Bill, MD;  Location: [facility] ENDOSCOPY;  Service: Endoscopy;  Laterality: N/A;   CYSTOSCOPY WITH STENT PLACEMENT Right 09/19/2016   Procedure: CYSTOSCOPY WITH STENT PLACEMENT;  Surgeon: Hildred Laser, MD;  Location: [facility] ORS;  Service: Urology;  Laterality: Right;   CYSTOSCOPY WITH STENT PLACEMENT Left 04/18/2022   Procedure: CYSTOSCOPY WITH STENT PLACEMENT;  Surgeon: Bjorn Pippin, MD;  Location: [facility] ORS;  Service: Urology;  Laterality: Left;   CYSTOSCOPY/URETEROSCOPY/HOLMIUM LASER/STENT PLACEMENT Left 05/08/2022   Procedure: CYSTOSCOPY/URETEROSCOPY/HOLMIUM LASER/STENT EXCHANGE;  Surgeon: Vanna Scotland, MD;  Location: [facility] ORS;  Service: Urology;  Laterality: Left;   DIAGNOSTIC LAPAROSCOPY     ESOPHAGOGASTRODUODENOSCOPY (EGD) WITH PROPOFOL N/A 11/23/2022   Procedure: ESOPHAGOGASTRODUODENOSCOPY (EGD) WITH PROPOFOL;  Surgeon: Wyline Mood, MD;  Location: [facility] ENDOSCOPY;  Service: Gastroenterology;  Laterality: N/A;   ESOPHAGOGASTRODUODENOSCOPY (EGD) WITH PROPOFOL N/A 11/26/2022   Procedure: ESOPHAGOGASTRODUODENOSCOPY (EGD) WITH PROPOFOL;  Surgeon: Toney Reil, MD;  Location: [facility] ENDOSCOPY;  Service: Gastroenterology;  Laterality: N/A;   EXTRACORPOREAL SHOCK WAVE LITHOTRIPSY Left 05/15/2022  Procedure: EXTRACORPOREAL SHOCK WAVE LITHOTRIPSY (ESWL);  Surgeon: Vanna Scotland, MD;  Location: [facility] ORS;  Service: Urology;  Laterality: Left;   FLEXIBLE SIGMOIDOSCOPY N/A 07/28/2019   Procedure: FLEXIBLE SIGMOIDOSCOPY;  Surgeon: Toney Reil, MD;  Location: [facility];  Service: Endoscopy;  Laterality: N/A;    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   HERNIA REPAIR     INSERTION OF MESH N/A 07/07/2022   Procedure: INSERTION OF MESH;  Surgeon: Campbell Lerner, MD;  Location: [facility] ORS;  Service: General;  Laterality: N/A;   LAPAROSCOPIC OVARIAN CYSTECTOMY Right 09/28/2018   Procedure: LAPAROSCOPIC RIGHT OVARIAN CYSTECTOMY;  Surgeon: Nadara Mustard, MD;  Location: [facility] ORS;  Service: Gynecology;  Laterality: Right;   LAPAROSCOPIC UNILATERAL SALPINGO OOPHERECTOMY Right 07/25/2021   Procedure: LAPAROSCOPIC UNILATERAL SALPINGO OOPHORECTOMY;  Surgeon: Nadara Mustard, MD;  Location: [facility] ORS;  Service: Gynecology;  Laterality: Right;   LEFT HEART CATH AND CORONARY ANGIOGRAPHY Left 04/07/2018   Procedure: LEFT HEART CATH AND CORONARY ANGIOGRAPHY;  Surgeon: Alwyn Pea, MD;  Location: [facility] INVASIVE CV LAB;  Service: Cardiovascular;  Laterality: Left;   OOPHORECTOMY     POLYPECTOMY  03/16/2023   Procedure: POLYPECTOMY;  Surgeon: Regis Bill, MD;  Location: [facility] ENDOSCOPY;  Service: Endoscopy;;   small bowel obstruction  07/2019   TONSILECTOMY, ADENOIDECTOMY, BILATERAL MYRINGOTOMY AND TUBES     TONSILLECTOMY     URETEROSCOPY WITH HOLMIUM LASER LITHOTRIPSY Right 09/19/2016   Procedure: URETEROSCOPY WITH HOLMIUM LASER LITHOTRIPSY;  Surgeon: Hildred Laser, MD;  Location: [facility] ORS;  Service: Urology;  Laterality: Right;   VENTRAL HERNIA REPAIR     scheduled for 07/07/2022   XI ROBOTIC ASSISTED VENTRAL HERNIA N/A 07/27/2021   Procedure: XI ROBOTIC ASSISTED INCARCERATED INCISIONAL HERNIA REPAIR;  Surgeon: Campbell Lerner, MD;  Location: [facility] ORS;  Service: General;  Laterality: N/A;   XI ROBOTIC ASSISTED VENTRAL HERNIA N/A 07/07/2022   Procedure: XI ROBOTIC ASSISTED VENTRAL HERNIA, recurrent incisional with mesh;  Surgeon: Campbell Lerner, MD;  Location: [facility] ORS;  Service: General;  Laterality: N/A;     MEDICATIONS:  Prior to Admission medications   Medication Sig Start Date End  Date Taking? Authorizing Provider  buPROPion (WELLBUTRIN XL) 150 MG 24 hr tablet Take 150 mg by mouth every morning. 06/17/21  Yes [provider]  dicyclomine (BENTYL) 10 MG capsule Take 1 capsule (10 mg total) by mouth 4 (four) times daily -  before meals and at bedtime. 08/17/22  Yes Jene Every, MD  hydrOXYzine (ATARAX) 25 MG tablet Take 1 tablet by mouth 3 (three) times daily as needed. 12/17/22  Yes [provider]  levonorgestrel (MIRENA) 20 MCG/24HR IUD 1 each by Intrauterine route once.   Yes [provider]  ondansetron (ZOFRAN-ODT) 4 MG disintegrating tablet Take 1 tablet (4 mg total) by mouth every 8 (eight) hours as needed for nausea or vomiting. 08/17/22  Yes Jene Every, MD  pantoprazole (PROTONIX) 40 MG tablet Take 1 tablet (40 mg total) by mouth 2 (two) times daily before a meal. 11/26/22  Yes Sunnie Nielsen, DO  polyethylene glycol powder (GLYCOLAX/MIRALAX) 17 GM/SCOOP powder Take 17 g by mouth daily. 06/23/23  Yes Minna Antis, MD  scopolamine (TRANSDERM-SCOP) 1 MG/3DAYS 1 patch every three (3) days as needed. 08/11/22  Yes [provider]  sertraline (ZOLOFT) 100 MG tablet Take 100 mg by mouth 2 (two) times daily.   Yes [provider]  traZODone (DESYREL) 50 MG tablet Take 50 mg by  mouth at bedtime.  09/06/18  Yes [provider]  gabapentin (NEURONTIN) 300 MG capsule Take 300 mg by mouth 3 (three) times daily. Patient not taking: Reported on 08/02/2023    [provider]  sulfamethoxazole-trimethoprim (BACTRIM DS) 800-160 MG tablet Take 1 tablet by mouth 2 (two) times daily. Patient not taking: Reported on 08/02/2023 06/23/23   Minna Antis, MD     ALLERGIES:  Allergies  Allergen Reactions   Compazine [Prochlorperazine] Other (See Comments)    Panic attack   Ceftin [Cefuroxime Axetil] Rash    Tolerated cefazolin before   Penicillins Rash    TOLERATED CEFAZOLIN BEFORE      SOCIAL HISTORY:   Social History   Socioeconomic History   Marital status: Married    Spouse name: Not on file   Number of children: Not on file   Years of education: Not on file   Highest education level: Not on file  Occupational History   Not on file  Tobacco Use   Smoking status: Never    Passive exposure: Never   Smokeless tobacco: Never  Vaping Use   Vaping status: Never Used  Substance and Sexual Activity   Alcohol use: No   Drug use: No   Sexual activity: Yes  Other Topics Concern   Not on file  Social History Narrative   Married and lives at home with two sons and parents.   Social Drivers of Health   Financial Resource Strain: Patient Declined (04/06/2023)   Received from [facility]   Overall Financial Resource Strain (CARDIA)    Difficulty of Paying Living Expenses: Patient declined  Food Insecurity: Patient Declined (04/06/2023)   Received from [facility]   Hunger Vital Sign    Worried About Running Out of Food in the Last Year: Patient declined    Ran Out of Food in the Last Year: Patient declined  Transportation Needs: Unknown (04/06/2023)   Received from [facility] - Transportation    In the past 12 months, has lack of transportation kept you from medical appointments or from getting medications?: Patient declined    Lack of Transportation (Non-Medical): No  Physical Activity: Not on file  Stress: Not on file  Social Connections: Not on file  Intimate Partner Violence: Not At Risk (11/23/2022)   Humiliation, Afraid, Rape, and Kick questionnaire    Fear of Current or Ex-Partner: No    Emotionally Abused: No    Physically Abused: No    Sexually Abused: No     FAMILY HISTORY:  Family History  Problem Relation Age of Onset   Polycystic ovary syndrome Mother    Diabetes Father    Bladder Cancer Father    Depression Father    Endometriosis Sister    Polycystic ovary syndrome Sister    Breast cancer  Paternal Grandmother    Diabetes Paternal Grandmother    Diabetes Paternal Grandfather    Kidney cancer Neg Hx    Prostate cancer Neg Hx       REVIEW OF SYSTEMS:  Review of Systems  Constitutional:  Positive for fever (Subjective). Negative for chills.  Respiratory:  Negative for cough, sputum production and shortness of breath.   Cardiovascular:  Negative for chest pain and palpitations.  Gastrointestinal:  Positive for abdominal pain, diarrhea, nausea and vomiting. Negative for constipation.  Genitourinary:  Negative for dysuria and urgency.  All other systems reviewed and are negative.   VITAL SIGNS:  Temp:  [98.2 F (36.8 C)-98.7 F (37.1 C)] 98.7 F (37.1 C) (03/31 0406) Pulse Rate:  [46-61] 49 (03/31 0406) Resp:  [9-17] 10 (03/31 0406) BP: (106-137)/(65-89) 106/66 (03/31 0406) SpO2:  [96 %-100 %] 97 % (03/31 0406) Weight:  [113.4 kg] 113.4 kg (03/30 1148)     Height: 6\' 1"  (185.4 cm) Weight: 113.4 kg BMI (Calculated): 32.99   INTAKE/OUTPUT:  03/30 0701 - 03/31 0700 In: 1234.1 [I.V.:1234.1] Out: -   PHYSICAL EXAM:  Physical Exam Vitals and nursing note reviewed. Exam conducted with a chaperone present.  Constitutional:      General: She is not in acute distress.    Appearance: She is well-developed. She is obese. She is not ill-appearing.     Comments: Resting in bed; NAD, family at bedside  HENT:     Head: Normocephalic and atraumatic.  Eyes:     General: No scleral icterus.    Extraocular Movements: Extraocular movements intact.  Pulmonary:     Effort: Pulmonary effort is normal. No respiratory distress.  Abdominal:     General: A surgical scar is present. There is no distension.     Palpations: Abdomen is soft.     Tenderness: There is abdominal tenderness (Mild) in the right lower quadrant. There is no guarding or rebound.     Comments: Abdomen is obese, soft, minimal tenderness mainly in RLQ, non-distended, no rebound/guarding  Genitourinary:     Comments: Deferred Skin:    General: Skin is warm and dry.  Neurological:     Mental Status: She is alert.      Labs:     Latest Ref Rng & Units 08/03/2023    6:32 AM 08/02/2023   11:49 AM 06/23/2023    8:14 AM  CBC  WBC 4.0 - 10.5 K/uL 4.7  6.7  9.2   Hemoglobin 12.0 - 15.0 g/dL 98.1  19.1  47.8   Hematocrit 36.0 - 46.0 % 31.1  34.5  31.5   Platelets 150 - 400 K/uL 286  332  305       Latest Ref Rng & Units 08/02/2023   11:49 AM 06/23/2023    8:14 AM 05/10/2023   11:32 AM  CMP  Glucose 70 - 99 mg/dL 80  82  84   BUN 6 - 20 mg/dL 14  14  14    Creatinine 0.44 - 1.00 mg/dL 2.95  6.21  3.08   Sodium 135 - 145 mmol/L 137  139  139   Potassium 3.5 - 5.1 mmol/L 3.8  3.5  3.8   Chloride 98 - 111 mmol/L 109  110  109   CO2 22 - 32 mmol/L 23  23  22    Calcium 8.9 - 10.3 mg/dL 8.4  8.2  8.2   Total Protein 6.5 - 8.1 g/dL 7.1  6.6  7.1   Total Bilirubin 0.0 - 1.2 mg/dL 0.7  0.4  0.8   Alkaline Phos 38 - 126 U/L 90  83  80   AST 15 - 41 U/L 19  14  19    ALT 0 - 44 U/L 22  16  21       Imaging studies:   CT Abdomen/Pelvis (08/02/2023) personally reviewed with dilated loops of small bowel mainly in RLQ, no free air, no pneumatosis, noted bariatric surgery changes, and radiologist report reviewed below:  IMPRESSION: 1. 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 Complex small bowel anatomy is noted with multiple areas of small bowel anastomosis noted. Within  the right hemiabdomen there is a dilated loop of small bowel with anastomotic changes measuring up to 5.2 cm in diameter. On the previous exam this loop was nondilated. There is a decrease caliber of the small bowel distal to the dilated anastomotic small bowel loop. Imaging findings are compatible with at least a partial small bowel obstruction associated with the anastomotic small bowel in the right hemiabdomen. This may reflect underlying anastomotic stricture or adhesive disease. 2. Cholecystectomy. Common  bile duct measures up to 9 mm, unchanged from previous exam. No calcified stones noted along the course of the CBD.    Assessment/Plan: (ICD-10's: K86.609) 38 y.o. female with abdominal pain, nausea, emesis, and diarrhea found to have possible pSBO vs gastroenteritis, complicated by pertinent comorbidities including history of duodenal switch, ventral hernia repair .   - Appreciate medicine admission - Agree with can hold on NGT for now. If she develops worsening pain, nausea/emesis, she may warrant placement of this - No need for emergent surgical intervention - I think it is reasonable to trial CLD - Monitor abdominal examination; on-going bowel function   - Serial KUB as needed - Pain control prn; antiemetics prn   - Mobilize   - Further management per primary service; we will follow along   All of the above findings and recommendations were discussed with the patient and her family, and all of their questions were answered to their expressed satisfaction.  Thank you for the opportunity to participate in this patient's care.   -- Lynden Oxford, PA-C [facility] 08/03/2023, 7:14 AM M-F: 7am - 4pm

## 2023-08-04 DIAGNOSIS — K56600 Partial intestinal obstruction, unspecified as to cause: Secondary | ICD-10-CM | POA: Diagnosis not present

## 2023-08-04 DIAGNOSIS — K56609 Unspecified intestinal obstruction, unspecified as to partial versus complete obstruction: Secondary | ICD-10-CM | POA: Diagnosis not present

## 2023-08-04 MED ORDER — OXYCODONE HCL 5 MG PO TABS: 5.0000 mg | ORAL_TABLET | Freq: Three times a day (TID) | ORAL | 0 refills | Status: DC | PRN

## 2023-08-04 NOTE — Discharge Summary (Signed)
 Physician Discharge Summary   Patient: Maria Fuller MRN: [id number] DOB: Oct 30, 1985  Admit date:     08/02/2023  Discharge date: 08/04/23  Discharge Physician: Maria Fuller   PCP: Maria Coins, PA-C   Recommendations at discharge:   PCP in 1 to 2 week  Discharge Diagnoses: Principal Problem:   SBO (small bowel obstruction) (HCC) Active Problems:   History of diabetes mellitus, type II   HTN (hypertension)   Obstructive sleep apnea   Major depressive disorder, recurrent, moderate (HCC)   Maria Fuller is a 38 y.o. female with medical history significant of hypertension, obesity status post gastric bypass, multiple abdominal surgeries, depression presenting with small bowel obstruction.  Patient reports progressive generalized abdominal pain with past 1 to 2 days.  Positive nausea vomiting with this.  Emesis nonbloody nonbilious.  Baseline history of small bowel obstructions in the past in the setting of prior abdominal surgeries    Followed by the [facility] bariatric surgery/weight loss team per patient.    CT scan abdomen showing concerns for partial small bowel obstruction in the right hemiabdomen and the anastomotic small bowel. Noted prior cholecystectomy with stable common bile duct dilatation.    SBO (small bowel obstruction) (HCC) --Progressive lower abdominal pain over the past 1 to 2 days in setting of multiple abdominal surgeries status post gastric bypass --CT imaging concerning for partial bowel small obstruction --General surgery consulted-follow-up recommendations--started CLD --Pain control--IV and po --pt advised to ambulate -- advance diet to soft diet. Tolerated breakfast well. Okay from surgical standpoint for discharge. Patient is agreeable. Tolerated soft diet well. Okay from surgery standpoint for discharge. Patient passing gas. Patient is in agreement.   Major depressive disorder, recurrent, moderate (HCC) --Stable --Continue home psychiatric  regimen   Obstructive sleep apnea/Obesity --CPAP   HTN (hypertension) --BP stable --not  on any meds at home   History of diabetes mellitus, type II --Blood sugar in 80s --not on meds     DC home      Family communication :none Consults : general surgery CODE STATUS: full DVT Prophylaxis : ambulation, enoxaparin     Pain control - [place] Controlled Substance Reporting System database was reviewed. and patient was instructed, not to drive, operate heavy machinery, perform activities at heights, swimming or participation in water activities or provide baby-sitting services while on Pain, Sleep and Anxiety Medications; until their outpatient Physician has advised to do so again. Also recommended to not to take more than prescribed Pain, Sleep and Anxiety Medications.  Disposition: Home Diet recommendation:  Discharge Diet Orders (From admission, onward)     Start     Ordered   08/04/23 0000  Diet general        08/04/23 0928           Regular diet DISCHARGE MEDICATION: Allergies as of 08/04/2023       Reactions   Compazine [prochlorperazine] Other (See Comments)   Panic attack   Ceftin [cefuroxime Axetil] Rash   Tolerated cefazolin before   Penicillins Rash   TOLERATED CEFAZOLIN BEFORE        Medication List     TAKE these medications    buPROPion 150 MG 24 hr tablet Commonly known as: WELLBUTRIN XL Take 150 mg by mouth every morning.   dicyclomine 10 MG capsule Commonly known as: BENTYL Take 1 capsule (10 mg total) by mouth 4 (four) times daily -  before meals and at bedtime.   hydrOXYzine 25 MG tablet Commonly  known as: ATARAX Take 1 tablet by mouth 3 (three) times daily as needed.   levonorgestrel 20 MCG/24HR IUD Commonly known as: MIRENA 1 each by Intrauterine route once.   ondansetron 4 MG disintegrating tablet Commonly known as: ZOFRAN-ODT Take 1 tablet (4 mg total) by mouth every 8 (eight) hours as needed for nausea or vomiting.    oxyCODONE 5 MG immediate release tablet Commonly known as: Oxy IR/ROXICODONE Take 1 tablet (5 mg total) by mouth every 8 (eight) hours as needed for severe pain (pain score 7-10).   pantoprazole 40 MG tablet Commonly known as: PROTONIX Take 1 tablet (40 mg total) by mouth 2 (two) times daily before a meal.   polyethylene glycol powder 17 GM/SCOOP powder Commonly known as: GLYCOLAX/MIRALAX Take 17 g by mouth daily.   scopolamine 1 MG/3DAYS Commonly known as: TRANSDERM-SCOP 1 patch every three (3) days as needed.   sertraline 100 MG tablet Commonly known as: ZOLOFT Take 100 mg by mouth 2 (two) times daily.   traZODone 50 MG tablet Commonly known as: DESYREL Take 50 mg by mouth at bedtime.        Follow-up Information     Maria Peer Tresa Endo, PA-C. Go on 08/12/2023.   Specialty: Physician Assistant Why: Go @ 3:00pm. Contact information: [address] [place]  [address] [phone]                Discharge Exam: Filed Weights   08/02/23 1148  Weight: 113.4 kg   GENERAL:  38 y.o.-year-old patient with no acute distress. Morbid obesity LUNGS: Normal breath sounds bilaterally, no wheezing CARDIOVASCULAR: S1, S2 normal. No murmur   ABDOMEN: Soft,nontender, nondistended. Abdominal surgical scars present few bowel sounds present.  EXTREMITIES: No  edema b/l.     Condition at discharge: fair  The results of significant diagnostics from this hospitalization (including imaging, microbiology, ancillary and laboratory) are listed below for reference.   Imaging Studies: CT ABDOMEN PELVIS W CONTRAST Result Date: 08/02/2023 CLINICAL DATA:  Abdominal pain which initially began in the left upper quadrant but now involves the right upper and lower quadrant. Evaluate for SBO. EXAM: CT ABDOMEN AND PELVIS WITH CONTRAST TECHNIQUE: Multidetector CT imaging of the abdomen and pelvis was performed using the standard protocol following bolus administration of intravenous  contrast. RADIATION DOSE REDUCTION: This exam was performed according to the departmental dose-optimization program which includes automated exposure control, adjustment of the mA and/or kV according to patient size and/or use of iterative reconstruction technique. CONTRAST:  OMNIPAQUE IOHEXOL 300 MG/ML  SOLN COMPARISON:  06/23/2023 FINDINGS: Lower chest: No pleural fluid or consolidative change. Subsegmental atelectasis and ground-glass attenuation noted in the posterior lung bases. Hepatobiliary: No focal liver abnormality. Cholecystectomy. Common bile duct measures up to 9 mm, image 42/5. This appears unchanged from previous exam. No calcified stones noted along the course of the CBD. Pancreas: Unremarkable. No pancreatic ductal dilatation or surrounding inflammatory changes. Spleen: Normal in size without focal abnormality. Adrenals/Urinary Tract: Normal adrenal glands. No nephrolithiasis, hydronephrosis or mass identified. Cyst within the upper pole of left kidney measures 1.2 cm, image 28/2. No follow-up imaging recommended. Urinary bladder appears normal. 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 Complex small bowel anatomy is noted with multiple areas of small bowel anastomosis noted. Within the right hemiabdomen there is a dilated loop of small bowel with anastomotic changes measuring up to 5.2 cm in diameter. On the previous exam this loop was nondilated. There is a decrease caliber of the  small bowel distal to the dilated anastomotic small bowel loop, image 43/5. Imaging findings are compatible with at least a partial small bowel obstruction associated with the anastomotic small bowel in the right hemiabdomen. This may reflect underlying anastomotic stricture or adhesive disease. The appendix is visualized and appears normal. No pathologic dilatation of the colon. No abnormal bowel wall thickening or inflammatory changes identified. Vascular/Lymphatic: Normal  appearance of the abdominal aorta. No signs of abdominopelvic adenopathy. Reproductive: IUD is noted within the uterus.  No adnexal mass. Other: There is no free fluid or fluid collections. No signs of pneumoperitoneum. Musculoskeletal: No acute or suspicious osseous findings. 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 IMPRESSION: 1. 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 Complex small bowel anatomy is noted with multiple areas of small bowel anastomosis noted. Within the right hemiabdomen there is a dilated loop of small bowel with anastomotic changes measuring up to 5.2 cm in diameter. On the previous exam this loop was nondilated. There is a decrease caliber of the small bowel distal to the dilated anastomotic small bowel loop. Imaging findings are compatible with at least a partial small bowel obstruction associated with the anastomotic small bowel in the right hemiabdomen. This may reflect underlying anastomotic stricture or adhesive disease. 2. Cholecystectomy. Common bile duct measures up to 9 mm, unchanged from previous exam. No calcified stones noted along the course of the CBD. Electronically Signed   By: Signa Kell M.D.   On: 08/02/2023 13:41    Microbiology: Results for orders placed or performed during the hospital encounter of 06/23/23  Urine Culture     Status: Abnormal   Collection Time: 06/23/23  9:11 AM   Specimen: Urine, Clean Catch  Result Value Ref Range Status   Specimen Description   Final    URINE, CLEAN CATCH Performed at [facility], [address] [place], [place] [address]    Special Requests   Final    NONE Performed at [facility], [address] [place], [place] [address]    Culture >=100,000 COLONIES/mL KLEBSIELLA PNEUMONIAE (A)  Final   Report Status 06/25/2023 FINAL  Final   Organism ID, Bacteria KLEBSIELLA PNEUMONIAE (A)  Final      Susceptibility   Klebsiella pneumoniae - MIC*     AMPICILLIN RESISTANT Resistant     CEFAZOLIN <=4 SENSITIVE Sensitive     CEFEPIME <=0.12 SENSITIVE Sensitive     CEFTRIAXONE <=0.25 SENSITIVE Sensitive     CIPROFLOXACIN <=0.25 SENSITIVE Sensitive     GENTAMICIN <=1 SENSITIVE Sensitive     IMIPENEM <=0.25 SENSITIVE Sensitive     NITROFURANTOIN 128 RESISTANT Resistant     TRIMETH/SULFA <=20 SENSITIVE Sensitive     AMPICILLIN/SULBACTAM <=2 SENSITIVE Sensitive     PIP/TAZO <=4 SENSITIVE Sensitive ug/mL    * >=100,000 COLONIES/mL KLEBSIELLA PNEUMONIAE    Labs: CBC: Recent Labs  Lab 08/02/23 1149 08/03/23 0632  WBC 6.7 4.7  HGB 11.2* 10.4*  HCT 34.5* 31.1*  MCV 94.8 92.6  PLT 332 286   Basic Metabolic Panel: Recent Labs  Lab 08/02/23 1149 08/03/23 0632  NA 137 137  K 3.8 4.1  CL 109 107  CO2 23 25  GLUCOSE 80 86  BUN 14 12  CREATININE 0.46 0.46  CALCIUM 8.4* 8.1*   Liver Function Tests: Recent Labs  Lab 08/02/23 1149 08/03/23 0632  AST 19 114*  ALT 22 48*  ALKPHOS 90 85  BILITOT 0.7 0.7  PROT  7.1 6.3*  ALBUMIN 3.9 3.4*   CBG: Recent Labs  Lab 08/02/23 2106  GLUCAP 86    Discharge time spent: greater than 30 minutes.  Signed: Enedina Finner, MD [facility] 08/04/2023

## 2023-08-04 NOTE — Progress Notes (Signed)
 Discharge instructions were reviewed with patient. Questions were encouraged and answered. Work note added to DC pack, and IV was removed.

## 2023-08-04 NOTE — Plan of Care (Signed)

## 2023-08-04 NOTE — Progress Notes (Signed)
 [facility] SURGICAL PROGRESS NOTE (cpt [phone])  Hospital Day(s): 0.   Interval History: Patient seen and examined, no acute events or new complaints overnight. Patient reports she is doing well. Still with sporadic RLQ soreness. No fever, chills, nausea, emesis, distension. No new labs. No new imaging. She has been on CLD; tolerating. She reports passing lots of flatus.   Review of Systems:  Constitutional: denies fever, chills  HEENT: denies cough or congestion  Respiratory: denies any shortness of breath  Cardiovascular: denies chest pain or palpitations  Gastrointestinal: denies abdominal pain, N/V Genitourinary: denies burning with urination or urinary frequency Musculoskeletal: denies pain, decreased motor or sensation  Vital signs in last 24 hours: [min-max] current  Temp:  [98 F (36.7 C)-98.3 F (36.8 C)] 98.3 F (36.8 C) (04/01 0330) Pulse Rate:  [49-54] 53 (04/01 0334) Resp:  [12-16] 16 (04/01 0330) BP: (96-122)/(46-75) 100/51 (04/01 0334) SpO2:  [97 %-100 %] 98 % (04/01 0334)     Height: 6\' 1"  (185.4 cm) Weight: 113.4 kg BMI (Calculated): 32.99   Intake/Output last 2 shifts:  03/31 0701 - 04/01 0700 In: 1060 [I.V.:1060] Out: -    Physical Exam:  Constitutional: alert, cooperative and no distress  HENT: normocephalic without obvious abnormality  Eyes: PERRL, EOM's grossly intact and symmetric  Respiratory: breathing non-labored at rest  Cardiovascular: regular rate and sinus rhythm  Gastrointestinal: soft, very minimal RLQ soreness, and non-distended, no rebound/guarding Musculoskeletal: no edema or wounds, motor and sensation grossly intact, NT    Labs:     Latest Ref Rng & Units 08/03/2023    6:32 AM 08/02/2023   11:49 AM 06/23/2023    8:14 AM  CBC  WBC 4.0 - 10.5 K/uL 4.7  6.7  9.2   Hemoglobin 12.0 - 15.0 g/dL 47.8  29.5  62.1   Hematocrit 36.0 - 46.0 % 31.1  34.5  31.5   Platelets 150 - 400 K/uL 286  332  305       Latest Ref Rng &  Units 08/03/2023    6:32 AM 08/02/2023   11:49 AM 06/23/2023    8:14 AM  CMP  Glucose 70 - 99 mg/dL 86  80  82   BUN 6 - 20 mg/dL 12  14  14    Creatinine 0.44 - 1.00 mg/dL 3.08  6.57  8.46   Sodium 135 - 145 mmol/L 137  137  139   Potassium 3.5 - 5.1 mmol/L 4.1  3.8  3.5   Chloride 98 - 111 mmol/L 107  109  110   CO2 22 - 32 mmol/L 25  23  23    Calcium 8.9 - 10.3 mg/dL 8.1  8.4  8.2   Total Protein 6.5 - 8.1 g/dL 6.3  7.1  6.6   Total Bilirubin 0.0 - 1.2 mg/dL 0.7  0.7  0.4   Alkaline Phos 38 - 126 U/L 85  90  83   AST 15 - 41 U/L 114  19  14   ALT 0 - 44 U/L 48  22  16      Imaging studies: No new pertinent imaging studies   Assessment/Plan: (ICD-10's: K30.609) 38 y.o. female with resolving abdominal pain, nausea, emesis, and diarrhea found to have possible pSBO vs gastroenteritis, complicated by pertinent comorbidities including history of duodenal switch, ventral hernia repair .   - Clinically improving with ROBF, we can advance diet - No role for NGT - No need for surgical intervention - Monitor abdominal examination;  on-going bowel function  - Pain control prn; antiemetics prn              - Mobilize              - Further management per primary service   - Discharge Planning: If tolerates diet for breakfast/lunch, she can discharge home from surgical perspective this afternoon. She does not need surgical follow up. She reports following with her bariatric surgeon in the past due to similar symptoms and was told it was adhesions. Encouraged her to follow up with them if these episodes recur.   All of the above findings and recommendations were discussed with the patient, and the medical team, and all of patient's questions were answered to her  expressed satisfaction.   -- Lynden Oxford, PA-C [facility] 08/04/2023, 7:17 AM M-F: 7am - 4pm

## 2023-08-04 NOTE — TOC CM/SW Note (Signed)
 Transition of Care [facility]) - Inpatient Brief Assessment   Patient Details  Name: Maria Fuller MRN: [id number] Date of Birth: [birth year]  [facility] of [facility] CM/SW Contact:    Margarito Liner, LCSW Phone Number: 08/04/2023, 9:35 AM   Clinical Narrative: Patient has orders to discharge home today. Chart reviewed. No TOC needs identified. CSW signing off.  Transition of Care Asessment: Insurance and Status: Insurance coverage has been reviewed Patient has primary care physician: Yes Home environment has been reviewed: Single family home Prior level of function:: Not documented Prior/Current Home Services: No current home services Social Drivers of Health Review: SDOH reviewed no interventions necessary Readmission risk has been reviewed: Yes Transition of care needs: no transition of care needs at this time

## 2023-08-07 ENCOUNTER — Encounter: Payer: Self-pay | Admitting: Surgery

## 2023-08-10 ENCOUNTER — Emergency Department

## 2023-08-10 ENCOUNTER — Other Ambulatory Visit: Payer: Self-pay

## 2023-08-10 ENCOUNTER — Telehealth: Payer: Self-pay

## 2023-08-10 ENCOUNTER — Emergency Department
Admission: EM | Admit: 2023-08-10 | Discharge: 2023-08-10 | Disposition: A | Discharge status: Home / Self Care | Attending: Emergency Medicine | Admitting: Emergency Medicine

## 2023-08-10 DIAGNOSIS — R1031 Right lower quadrant pain: Secondary | ICD-10-CM | POA: Insufficient documentation

## 2023-08-10 DIAGNOSIS — R110 Nausea: Secondary | ICD-10-CM | POA: Diagnosis not present

## 2023-08-10 HISTORY — DX: Unspecified intestinal obstruction, unspecified as to partial versus complete obstruction: K56.609

## 2023-08-10 LAB — URINALYSIS, ROUTINE W REFLEX MICROSCOPIC
Bilirubin Urine: NEGATIVE
Glucose, UA: NEGATIVE mg/dL
Hgb urine dipstick: NEGATIVE
Ketones, ur: NEGATIVE mg/dL
Leukocytes,Ua: NEGATIVE
Nitrite: NEGATIVE
Protein, ur: 30 mg/dL — AB
Specific Gravity, Urine: 1.031 — ABNORMAL HIGH (ref 1.005–1.030)
pH: 5 (ref 5.0–8.0)

## 2023-08-10 LAB — COMPREHENSIVE METABOLIC PANEL WITH GFR
ALT: 23 U/L (ref 0–44)
AST: 19 U/L (ref 15–41)
Albumin: 4 g/dL (ref 3.5–5.0)
Alkaline Phosphatase: 88 U/L (ref 38–126)
Anion gap: 7 (ref 5–15)
BUN: 18 mg/dL (ref 6–20)
CO2: 21 mmol/L — ABNORMAL LOW (ref 22–32)
Calcium: 8.3 mg/dL — ABNORMAL LOW (ref 8.9–10.3)
Chloride: 110 mmol/L (ref 98–111)
Creatinine, Ser: 0.58 mg/dL (ref 0.44–1.00)
GFR, Estimated: >60 mL/min (ref >60)
Glucose, Bld: 81 mg/dL (ref 70–99)
Potassium: 3.9 mmol/L (ref 3.5–5.1)
Sodium: 138 mmol/L (ref 135–145)
Total Bilirubin: 0.3 mg/dL (ref 0.0–1.2)
Total Protein: 7.3 g/dL (ref 6.5–8.1)

## 2023-08-10 LAB — CBC
HCT: 34.3 % — ABNORMAL LOW (ref 36.0–46.0)
Hemoglobin: 11.4 g/dL — ABNORMAL LOW (ref 12.0–15.0)
MCH: 30.7 pg (ref 26.0–34.0)
MCHC: 33.2 g/dL (ref 30.0–36.0)
MCV: 92.5 fL (ref 80.0–100.0)
Platelets: 331 10*3/uL (ref 150–400)
RBC: 3.71 MIL/uL — ABNORMAL LOW (ref 3.87–5.11)
RDW: 13 % (ref 11.5–15.5)
WBC: 6.7 10*3/uL (ref 4.0–10.5)
nRBC: 0 % (ref 0.0–0.2)

## 2023-08-10 LAB — POC URINE PREG, ED: Preg Test, Ur: NEGATIVE

## 2023-08-10 LAB — LIPASE, BLOOD: Lipase: 27 U/L (ref 11–51)

## 2023-08-10 MED ORDER — DICYCLOMINE HCL 10 MG PO CAPS: 10.0000 mg | ORAL_CAPSULE | Freq: Three times a day (TID) | ORAL | 1 refills | Status: AC

## 2023-08-10 MED ORDER — IOHEXOL 300 MG/ML  SOLN
100.0000 mL | Freq: Once | INTRAMUSCULAR | Status: AC | PRN
  Administered 2023-08-10: 80 mL via INTRAVENOUS

## 2023-08-10 MED ORDER — ONDANSETRON 4 MG PO TBDP: 4.0000 mg | ORAL_TABLET | Freq: Three times a day (TID) | ORAL | 0 refills | Status: DC | PRN

## 2023-08-10 MED ORDER — OXYCODONE HCL 5 MG PO TABS: 5.0000 mg | ORAL_TABLET | Freq: Three times a day (TID) | ORAL | 0 refills | Status: DC | PRN

## 2023-08-10 MED ORDER — FENTANYL CITRATE PF 50 MCG/ML IJ SOSY
50.0000 ug | PREFILLED_SYRINGE | Freq: Once | INTRAMUSCULAR | Status: AC
  Administered 2023-08-10: 50 ug via INTRAVENOUS
  Filled 2023-08-10: qty 1

## 2023-08-10 MED ORDER — ONDANSETRON HCL 4 MG/2ML IJ SOLN
4.0000 mg | Freq: Once | INTRAMUSCULAR | Status: AC
  Administered 2023-08-10: 4 mg via INTRAVENOUS
  Filled 2023-08-10: qty 2

## 2023-08-10 NOTE — Telephone Encounter (Signed)
 Patient called back and said that the pain has worsened. She did switch to clear liquids and will be going to the ED to be seen today.

## 2023-08-10 NOTE — Discharge Instructions (Addendum)
 Your exam, labs, and CT scan are normal and reassuring at this time. There is no evidence of a bowel obstruction. You should continue to monitor your oral intake and advance from liquid to soft to solid, as tolerated. Take the prescription meds as directed. Follow-up with your primary provider as needed.

## 2023-08-10 NOTE — Telephone Encounter (Signed)
 Message left for the patient to call the office with an update on her condition.

## 2023-08-10 NOTE — ED Triage Notes (Signed)
 Patient states lower abdominal pain and nausea; recently diagnosed with partial SBO.

## 2023-08-10 NOTE — ED Provider Notes (Signed)
 [facility] Emergency Department Provider Note     Event Date/Time   First MD Initiated Contact with Patient 08/10/23 1356     (approximate)   History   Abdominal Pain   HPI  Maria Fuller is a 38 y.o. female with a history of PCOS, obesity, SBO/partial SBO, presents to the ED for evaluation of right lower abdominal pain with associated nausea.  Patient had a hospital admission on 3/30, for small bowel obstruction, and was discharged on 4/1. She stopped all solid food intake last night.   Physical Exam   Triage Vital Signs: ED Triage Vitals  Encounter Vitals Group     BP 08/10/23 1249 135/75     Systolic BP Percentile --      Diastolic BP Percentile --      Pulse Rate 08/10/23 1249 60     Resp 08/10/23 1249 20     Temp 08/10/23 1249 98.7 F (37.1 C)     Temp Source 08/10/23 1249 Oral     SpO2 08/10/23 1249 100 %     Weight 08/10/23 1247 243 lb (110.2 kg)     Height 08/10/23 1247 6\' 1"  (1.854 m)     Head Circumference --      Peak Flow --      Pain Score 08/10/23 1247 7     Pain Loc --      Pain Education --      Exclude from Growth Chart --     Most recent vital signs: Vitals:   08/10/23 1249  BP: 135/75  Pulse: 60  Resp: 20  Temp: 98.7 F (37.1 C)  SpO2: 100%    General Awake, no distress. NAD HEENT NCAT. PERRL. EOMI. No rhinorrhea. Mucous membranes are moist.  CV:  Good peripheral perfusion. RRR RESP:  Normal effort. CTA ABD:  No distention. Soft, nontender. No rebound, guarding or rigidity  ED Results / Procedures / Treatments   Labs (all labs ordered are listed, but only abnormal results are displayed) Labs Reviewed  COMPREHENSIVE METABOLIC PANEL WITH GFR - Abnormal; Notable for the following components:      Result Value   CO2 21 (*)    Calcium 8.3 (*)    All other components within normal limits  CBC - Abnormal; Notable for the following components:   RBC 3.71 (*)    Hemoglobin 11.4 (*)    HCT 34.3 (*)     All other components within normal limits  URINALYSIS, ROUTINE W REFLEX MICROSCOPIC - Abnormal; Notable for the following components:   Color, Urine YELLOW (*)    APPearance HAZY (*)    Specific Gravity, Urine 1.031 (*)    Protein, ur 30 (*)    Bacteria, UA RARE (*)    All other components within normal limits  LIPASE, BLOOD  POC URINE PREG, ED     EKG   RADIOLOGY  I personally viewed and evaluated these images as part of my medical decision making, as well as reviewing the written report by the radiologist.  ED Provider Interpretation: no evidence of SBO  CT ABDOMEN PELVIS W CONTRAST Result Date: 08/10/2023 CLINICAL DATA:  Bowel obstruction suspected. Lower abdominal pain. Nausea. EXAM: CT ABDOMEN AND PELVIS WITH CONTRAST TECHNIQUE: Multidetector CT imaging of the abdomen and pelvis was performed using the standard protocol following bolus administration of intravenous contrast. RADIATION DOSE REDUCTION: This exam was performed according to the departmental dose-optimization program which includes automated exposure control, adjustment of  the mA and/or kV according to patient size and/or use of iterative reconstruction technique. CONTRAST:  80mL OMNIPAQUE IOHEXOL 300 MG/ML  SOLN COMPARISON:  CT scan abdomen and pelvis from 08/02/2023. FINDINGS: Lower chest: There are dependent and patchy atelectatic changes in the visualized lung bases. No overt consolidation. No pleural effusion. The heart is normal in size. No pericardial effusion. Hepatobiliary: The liver is normal in size. Non-cirrhotic configuration. No suspicious mass. These is mild diffuse hepatic steatosis. No intrahepatic bile duct dilation. There is mild prominence of the extrahepatic bile duct, most likely due to post cholecystectomy status. Gallbladder is surgically absent. Pancreas: Unremarkable. No pancreatic ductal dilatation or surrounding inflammatory changes. Spleen: Within normal limits. No focal lesion. Adrenals/Urinary  Tract: Adrenal glands are unremarkable. No suspicious renal mass. There is a 1.1 x 1.4 cm simple cyst in the left kidney upper pole, anteriorly. No hydronephrosis. No renal or ureteric calculi. Urinary bladder is under distended, precluding optimal assessment. However, no large mass or stones identified. No perivesical fat stranding. Stomach/Bowel: Postsurgical changes from prior sleeve gastrectomy noted. Enteroenteric anastomosis noted in the posterior pelvis. There is a probable tiny sliding hiatal hernia. No disproportionate dilation of the small or large bowel loops. No evidence of abnormal bowel wall thickening or inflammatory changes. The appendix is unremarkable. Note is made of redundant sigmoid colon reaching up to the right upper quadrant. Vascular/Lymphatic: No ascites or pneumoperitoneum. No abdominal or pelvic lymphadenopathy, by size criteria. No aneurysmal dilation of the major abdominal arteries. Reproductive: Normal-sized retroverted uterus containing a T-shaped intrauterine device, which appears in satisfactory position. No large adnexal mass. Other: There is a tiny fat containing umbilical hernia. The soft tissues and abdominal wall are otherwise unremarkable. Musculoskeletal: No suspicious osseous lesions. There are mild multilevel degenerative changes in the visualized spine. Bilateral L5 spondylolysis noted with resultant grade 1 anterolisthesis of L5 over S1. IMPRESSION: *No acute inflammatory process identified within the abdomen or pelvis. No bowel obstruction. *Multiple other nonacute observations, as described above. Electronically Signed   By: Jules Schick M.D.   On: 08/10/2023 15:59   PROCEDURES:  Critical Care performed: No  Procedures   MEDICATIONS ORDERED IN ED: Medications  ondansetron (ZOFRAN) injection 4 mg (4 mg Intravenous Given 08/10/23 1435)  fentaNYL (SUBLIMAZE) injection 50 mcg (50 mcg Intravenous Given 08/10/23 1436)  iohexol (OMNIPAQUE) 300 MG/ML solution 100 mL  (80 mLs Intravenous Contrast Given 08/10/23 1450)     IMPRESSION / MDM / ASSESSMENT AND PLAN / ED COURSE  I reviewed the triage vital signs and the nursing notes.                              Differential diagnosis includes, but is not limited to, ovarian cyst, ovarian torsion, acute appendicitis, diverticulitis, urinary tract infection/pyelonephritis, bowel obstruction, colitis, renal colic, gastroenteritis, hernia, fibroids, endometriosis, pregnancy related pain including ectopic pregnancy, etc.  Patient's presentation is most consistent with acute presentation with potential threat to life or bodily function.  Patient's diagnosis is consistent with right lower abd pain with evidence of SBO.  Patient with a history of partial SBO due to adhesions, presents for evaluation. Her exam, labs and CT scan are normal and reassuring. No acute lab abnormalities noted. No CT evidence of SBO or other etiology. Patient will be discharged home with prescriptions for dicyclomine, ondansetron, and Oxycodone. Patient is to follow up with her PCP or specialists as discussed, as needed or otherwise directed. Patient is  given ED precautions to return to the ED for any worsening or new symptoms.   FINAL CLINICAL IMPRESSION(S) / ED DIAGNOSES   Final diagnoses:  Right lower quadrant abdominal pain     Rx / DC Orders   ED Discharge Orders          Ordered    dicyclomine (BENTYL) 10 MG capsule  3 times daily before meals & bedtime        08/10/23 1615    ondansetron (ZOFRAN-ODT) 4 MG disintegrating tablet  Every 8 hours PRN        08/10/23 1615    oxyCODONE (OXY IR/ROXICODONE) 5 MG immediate release tablet  Every 8 hours PRN        08/10/23 1615             Note:  This document was prepared using Dragon voice recognition software and may include unintentional dictation errors.    Lissa Hoard, PA-C 08/10/23 1934    Jene Every, MD 08/11/23 [phone]

## 2023-08-21 NOTE — Consults (Addendum)
 Consult Note  Patient Name: Maria Fuller DOB: 02/16/1986 Age: 38 y.o. MRN: [id number]  Consult Requested By: Sallean, Andrew John, MD  Reason for Consult: 37yoF w/ abd pain. Pt of Dr Sudan. Duodenal switch.  HPI  Maria Fuller is a 38 y.o. female s/p biliopancreatic diversion and Braun enterostomy at [facility] in 2016 s/p adhesiolysis and closure of internal hernia with Dr. Sudan in 2023. 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 She now follows with Dr. Sudan and has been treated with flagyl for bacterial overgrowth. She has had discussions of chronic constipation and abdominal pain with plans for GI to work up colonic dysmotility. She has since been admitted to an OSH with partial SBO. She followed-up with Dr. Sudan in the clinic on 08/18/23 where she reported ongoing severe right sided abdominal pain and constipation. The plan was for an outpatient CT scan, however patient presented to the [facility] ED today with ongoing severe RLQ abdominal pain and persistent nausea.  Her LBM was yesterday where she noticed BRBPR - she does not feel she has a fissure. When she has a BM she has pain in her RLQ. She denies fevers but reports frequent night sweats since her last hospitalization. She is not vomiting but is constantly nauseous, however has fortunately been able to tolerate soft foods at home.    Medical History   Past Medical History:  Diagnosis Date  . Anxiety 2005   Psychiatry  . Depression 2005   psychiatry, bipolar disorder  . Diabetes mellitus type 2, uncomplicated (CMS/HHS-HCC) 06/2011   seen in ER  . Diverticulitis 2010  . Diverticulosis 07/14/2014  . Gastritis 07/14/2014  . Headache 07/10/2013  . History of diabetes mellitus, type II 03/04/2013  . Hypertension 07/2011  . Hypothyroidism 2011  . Major depressive disorder, single episode 07/22/2013   Overview:  Overview:  psychiatry  Last Assessment & Plan:  Pt with increasing depression since recent  illness (intractable headaches secondary to likely viral meningitis). Pt with history of depressive episodes about once a year and feels that she is starting to have one. Denies any suicidal/homicidal ideation or plan. PHQ-9 total score of 10. -Will continue current meds (max of zoloft ) and also o  . Migraine 04/07/2014   Last Assessment & Plan:  Improved since the initiation of propranolol. Continue current meds.    . Migraine headache   . Obesity 07/2011  . OSA (obstructive sleep apnea) 09/2012  . PCOS (polycystic ovarian syndrome) 2007  . Tubular adenoma of colon 07/14/2014    Past Surgical History:  Procedure Laterality Date  . MYRINGOTOMY & TUBES Bilateral 1990  . CHOLECYSTECTOMY  2002  . COLONOSCOPY  07/14/2014   Tubular adenoma/Repeat 52yrs/MGR  . EGD  07/14/2014   Gastritis/No Repeat/MGR  . LAPAROSCOPY DIAGNOSTIC N/A 08/04/2019   Procedure: DIAGNOSTIC LAPAROSCOPY, REPAIR OF INTERNAL HERNIA, TAP BLOCK ;  Surgeon: Virgel Gobble, MD;  Location: [facility] OR;  Service: General Surgery;  Laterality: N/A;  . LAPAROSCOPY DIAGNOSTIC N/A 07/12/2021   Procedure: LAPAROSCOPY, SURGICAL; ABDOMEN, PERITONEUM, AND OMENTUM, DIAGNOSTIC, WITH OR WITHOUT COLLECTION OF SPECIMEN(S) BY BRUSHING OR WASHING (SEPARATE PROCEDURE);  Surgeon: Audrey Lang Barter, MD;  Location: [facility] OR;  Service: General Surgery;  Laterality: N/A;  . XI ROBOTIC ASSISTED INCARCERATED INCISIONAL HERNIA REPAIR  07/27/2021   Dr. Honor Leghorn  . LAPAROSCOPY DIAGNOSTIC N/A 03/26/2022   Procedure: LAPAROSCOPY, SURGICAL; ABDOMEN, PERITONEUM, AND OMENTUM, DIAGNOSTIC, WITH OR WITHOUT COLLECTION OF SPECIMEN(S) BY BRUSHING OR WASHING (SEPARATE PROCEDURE);  Surgeon:  Sudan, Ranjan, MD;  Location: [facility] OR;  Service: General Surgery;  Laterality: N/A;  . Colon @ [facility]  03/16/2023   Inflammatory polyp/Polyp was an inflammatory polyp so it's benign. Prep was fair. Repeat colonoscopy in 3 years/[facility]  . BARIATRIC SURGERY  04/2015  .  CESAREAN SECTION    . COLON SURGERY  07/2019  . duodenal switch    . OOPHORECTOMY  2024   LAPAROSCOPIC UNILATERAL SALPINGO OOPHORECTOMY  . TONSILLECTOMY      Social History   Socioeconomic History  . Marital status: Married    Spouse name: Married  . Number of children: 2  . Years of education: 17  . Highest education level: Some college, no degree  Occupational History    Comment: looking for work 07/2011  Tobacco Use  . Smoking status: Never  . Smokeless tobacco: Never  Vaping Use  . Vaping status: Never Used  Substance and Sexual Activity  . Alcohol use: No  . Drug use: No  . Sexual activity: Yes    Partners: Male    Birth control/protection: I.U.D., None    Comment: Mirena   Other Topics Concern  . Would you please tell us  about the people who live in your home, your pets, or anything else important to your social life? No  Social History Narrative   Patient is married and has one 82-year old son.   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    Social Drivers of Health   Financial Resource Strain: Patient Declined (04/06/2023)   Overall Financial Resource Strain (CARDIA)   . Difficulty of Paying Living Expenses: Patient declined  Food Insecurity: No Food Insecurity (08/03/2023)   Received from [facility]   Hunger Vital Sign   . Worried About Programme Researcher, Broadcasting/film/video in the Last Year: Never true   . Ran Out of Food in the Last Year: Never true  Transportation Needs: No Transportation Needs (08/03/2023)   Received from [facility] - Transportation   . Lack of Transportation (Medical): No   . Lack of Transportation (Non-Medical): No  Social Connections: Moderately Integrated (08/03/2023)   Received from [facility]   Social Connection and Isolation Panel [NHANES]   . Frequency of Communication with Friends and Family: More than three times a week   .  Frequency of Social Gatherings with Friends and Family: Twice a week   . Attends Religious Services: 1 to 4 times per year   . Active Member of Clubs or Organizations: No   . Attends Banker Meetings: Never   . Marital Status: Married  Housing Stability: Unknown (08/21/2023)   Housing Stability Vital Sign   . Unable to Pay for Housing in the Last Year: Patient declined   . Number of Times Moved in the Last Year: 0   . Homeless in the Last Year: No    Family History  Problem Relation Age of Onset  . Polycystic ovary syndrome Mother   . Depression Mother   . Skin cancer Mother   . Diabetes type II Father   . High blood pressure (Hypertension) Father   . Irritable bowel syndrome Father   . Colon polyps Father   . Depression Father   . Coronary Artery Disease (Blocked arteries around heart) Father   . Polycystic ovary syndrome Sister   . Breast cancer Maternal Grandmother  89  . Diabetes type II Paternal Grandmother   . Breast cancer Paternal Grandmother 12  . Depression Paternal Grandmother   . Diabetes type II Paternal Grandfather   . High blood pressure (Hypertension) Paternal Grandfather   . Depression Paternal Grandfather   . Coronary Artery Disease (Blocked arteries around heart) Paternal Grandfather   . Colon cancer Neg Hx     Allergies & Medications Allergies  Allergen Reactions  . Ceftin [Cefuroxime Axetil] Rash  . Penicillins Rash  . Compazine  [Prochlorperazine ] Anxiety    Current Outpatient Medications:  .  buPROPion  (WELLBUTRIN  XL) 150 MG XL tablet, Take 1 tablet (150 mg total) by mouth once daily, Disp: 90 tablet, Rfl: 3 .  gabapentin  (NEURONTIN ) 300 MG capsule, Take 1 capsule (300 mg total) by mouth every 8 (eight) hours as needed (for abdominal pain) for up to 7 days, Disp: 21 capsule, Rfl: 0 .  hydrOXYzine  (ATARAX ) 25 MG tablet, TAKE ONE TABLET BY MOUTH THREE TIMES A DAY AS NEEDED FOR ANXIETY, Disp: 30 tablet, Rfl: 8 .  levonorgestreL  (MIRENA  52  MG) 20 mcg/24 hr (5 years) IUD, Insert into the uterus, Disp: , Rfl:  .  ondansetron  (ZOFRAN -ODT) 4 MG disintegrating tablet, Take 1 tablet (4 mg total) by mouth every 8 (eight) hours as needed for Nausea, Disp: 30 tablet, Rfl: 0 .  pantoprazole  (PROTONIX ) 40 MG DR tablet, TAKE ONE TABLET BY MOUTH ONE TIME DAILY, Disp: 90 tablet, Rfl: 3 .  polyethylene glycol (MIRALAX ) packet, Take 1 packet (17 g total) by mouth 2 (two) times daily Mix in 4-8ounces of fluid prior to taking., Disp: , Rfl:  .  promethazine  (PHENERGAN ) 12.5 MG tablet, TAKE ONE TABLET BY MOUTH EVERY 6 HOURS AS NEEDED FOR NAUSEA FOR UP TO 15 DAYS, Disp: 30 tablet, Rfl: 8 .  scopolamine  (TRANSDERM-SCOP) 1 mg over 3 days patch, Place 1 patch (1 mg total) onto the skin every third day, Disp: 10 patch, Rfl: 0 .  sennosides (SENOKOT) 8.6 mg tablet, Take 1 tablet by mouth once daily, Disp: , Rfl:  .  sertraline  (ZOLOFT ) 100 MG tablet, TAKE 2 TABLETS(200 MG) BY MOUTH EVERY DAY, Disp: 200 tablet, Rfl: 3 .  traZODone  (DESYREL ) 50 MG tablet, Take 1 tablet (50 mg total) by mouth at bedtime, Disp: 100 tablet, Rfl: 2   ROS ROS obtained. Pertinent positives and negatives noted in HPI.  Objective Data   Vital Signs: Vitals:   08/21/23 0640  BP: (!) 127/114  BP Location: Right upper arm  Patient Position: Sitting  Pulse: 52  Resp: 22  Temp: 37 C (98.6 F)  TempSrc: Oral  SpO2: 100%   Ht:  Wt:  ADJ:Uyzmz is no height or weight on file to calculate BSA. There is no height or weight on file to calculate BMI.  Physical Exam General: alert, cooperative, in no acute distress Lungs: unlabored breathing on RA, speaking in complete sentences Cardiac: regular rate and rhythm Abdomen: soft, non-distended, RLQ TTP, no rebound/guarding, non-peritonitic  Extremities: no cyanosis or edema   Pertinent Labs: Recent Labs    08/21/23 0649  WBC 6.3  HGB 11.5*  HCT 35.3  PLT 343   Recent Labs    08/21/23 0649  NA 135  K 3.7  CL 109*   BUN 13  CREATININE 0.5  GLUCOSE 84   Recent Labs    08/21/23 0649  CALCIUM 8.0*   No results for input(s): PROTIME, INR, APTT in the last 72 hours. Recent Labs  08/21/23 0649  AST 21  ALT 23  ALKPHOS 88   Lab Results  Component Value Date   SPECGRAV 1.024 08/21/2023   GLUCOSEU Negative 08/21/2023   BLOODU Negative 08/21/2023   KETONESU Negative 08/21/2023   UROBILINOGEN 0.2 08/21/2023   BILIRUBINUR Negative 08/21/2023   LEUKOCYTESUR Negative 08/21/2023   NITRITE Negative 08/21/2023      Imaging: CT A/P with contrast 4/18 IMPRESSION:    -Postoperative changes from previous bariatric surgery. Currently there are no specific findings to suggest high-grade bowel obstruction.   -Likely on a postoperative basis, there is altered gastrointestinal anatomy with the cecum located in the right upper quadrant. This is similar to the previous exam.    Assessment/Recommendations   Patient Active Problem List  Diagnosis  . Hyperlipidemia  . Anxiety state  . Morbid obesity (CMS-HCC)  . History of hypertension  . History of PCOS  . Status post bariatric surgery  . Partial small bowel obstruction (CMS/HHS-HCC)  . S/P biliopancreatic diversion with duodenal switch  . Internal hernia  . Situational mixed anxiety and depressive disorder  . Major depressive disorder, recurrent, moderate (CMS/HHS-HCC)  . Left ovarian cyst  . Dysmenorrhea  . Menometrorrhagia  . PCOS (polycystic ovarian syndrome)  . Lower abdominal pain  . SBO (small bowel obstruction) (CMS/HHS-HCC)  . Abdominal pain  . Gestational diabetes mellitus (HHS-HCC)  . Food bolus obstruction of intestine  (CMS/HHS-HCC)  . Bradycardia  . Epigastric pain  . Febrile urinary tract infection  . Left renal stone  . Left ureteral stone    Assessment: Paeton Latouche is a 38 y.o. female s/p biliopancreatic diversion and Braun enterostomy at [facility] in 2016 s/p adhesiolysis and closure of internal hernia  with Dr. Sudan in 2023. 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 She has recently been admitted to an OSH with partial SBO and has ongoing severe right sided abdominal pain, constipation, and nausea.  In the ED she is hemodynamically stable. Labs are reassuring. On exam she is not peritonitic. Dr. Sudan reviewed her CT today and compared it to prior and today's imaging looks overall improved and w/o obstruction.  Plan: - Admit to observation - Pain and nausea control PRN - May benefit from colonoscopy. Will confirm last scope with patient. - Daily bowel regimen - mIVF - Diet: stage 4 - Daily labs - Continue home meds - LMWH, SCDs   Rockie Mclean, [place] Pager: [phone] Metabolic and Weight Loss Surgery 08/21/2023 10:23 AM

## 2023-08-21 NOTE — H&P (Signed)
 See surgery consult note by this author on this date.  Rockie Plan, PA-C Pager: [phone] Metabolic and Weight Loss Surgery

## 2023-08-21 NOTE — ED Provider Notes (Signed)
 [facility] EMERGENCY DEPARTMENT  ED Provider Note History   Chief Complaint  Patient presents with  . Abdominal Pain   History of Present Illness   Maria Fuller is a 38 y.o. female who presents for right lower quadrant abdominal pain for several days.  She has a history of multiple abdominal surgeries including a duodenal switch procedure, ventral hernia repair, cholecystectomy.  She was seen by Dr. Sudan at bariatric surgery clinic 4 days ago and had a CT scan scheduled for 3 days from now.  However her pain has become worse and it was recommended that she come to the emergency department for more emergent workup.  She has had nausea without vomiting.  Last bowel movement was yesterday.  She had a recent admission at [facility] for a partial small bowel obstruction. Patient Info:    History provided by:  Patient and medical records   Language interpreter used: No    Past Medical History:  Diagnosis Date  . Anxiety 2005   Psychiatry  . Depression 2005   psychiatry, bipolar disorder  . Diabetes mellitus type 2, uncomplicated (CMS/HHS-HCC) 06/2011   seen in ER  . Diverticulitis 2010  . Diverticulosis 07/14/2014  . Gastritis 07/14/2014  . Headache 07/10/2013  . History of diabetes mellitus, type II 03/04/2013  . Hypertension 07/2011  . Hypothyroidism 2011  . Major depressive disorder, single episode 07/22/2013   Overview:  Overview:  psychiatry  Last Assessment & Plan:  Pt with increasing depression since recent illness (intractable headaches secondary to likely viral meningitis). Pt with history of depressive episodes about once a year and feels that she is starting to have one. Denies any suicidal/homicidal ideation or plan. PHQ-9 total score of 10. -Will continue current meds (max of zoloft ) and also o  . Migraine 04/07/2014   Last Assessment & Plan:  Improved since the initiation of propranolol. Continue current meds.    . Migraine headache   . Obesity 07/2011  . OSA (obstructive  sleep apnea) 09/2012  . PCOS (polycystic ovarian syndrome) 2007  . Tubular adenoma of colon 07/14/2014   Past Surgical History:  Procedure Laterality Date  . MYRINGOTOMY & TUBES Bilateral 1990  . CHOLECYSTECTOMY  2002  . COLONOSCOPY  07/14/2014   Tubular adenoma/Repeat 66yrs/MGR  . EGD  07/14/2014   Gastritis/No Repeat/MGR  . LAPAROSCOPY DIAGNOSTIC N/A 08/04/2019   Procedure: DIAGNOSTIC LAPAROSCOPY, REPAIR OF INTERNAL HERNIA, TAP BLOCK ;  Surgeon: Virgel Gobble, MD;  Location: [facility] OR;  Service: General Surgery;  Laterality: N/A;  . LAPAROSCOPY DIAGNOSTIC N/A 07/12/2021   Procedure: LAPAROSCOPY, SURGICAL; ABDOMEN, PERITONEUM, AND OMENTUM, DIAGNOSTIC, WITH OR WITHOUT COLLECTION OF SPECIMEN(S) BY BRUSHING OR WASHING (SEPARATE PROCEDURE);  Surgeon: Audrey Lang Barter, MD;  Location: [facility] OR;  Service: General Surgery;  Laterality: N/A;  . XI ROBOTIC ASSISTED INCARCERATED INCISIONAL HERNIA REPAIR  07/27/2021   Dr. Honor Leghorn  . LAPAROSCOPY DIAGNOSTIC N/A 03/26/2022   Procedure: LAPAROSCOPY, SURGICAL; ABDOMEN, PERITONEUM, AND OMENTUM, DIAGNOSTIC, WITH OR WITHOUT COLLECTION OF SPECIMEN(S) BY BRUSHING OR WASHING (SEPARATE PROCEDURE);  Surgeon: Sudan, Ranjan, MD;  Location: [facility] OR;  Service: General Surgery;  Laterality: N/A;  . Colon @ [facility]  03/16/2023   Inflammatory polyp/Polyp was an inflammatory polyp so it's benign. Prep was fair. Repeat colonoscopy in 3 years/[facility]  . BARIATRIC SURGERY  04/2015  . CESAREAN SECTION    . COLON SURGERY  07/2019  . duodenal switch    . OOPHORECTOMY  2024   LAPAROSCOPIC UNILATERAL SALPINGO OOPHORECTOMY  .  TONSILLECTOMY     Family History  Problem Relation Age of Onset  . Polycystic ovary syndrome Mother   . Depression Mother   . Skin cancer Mother   . Diabetes type II Father   . High blood pressure (Hypertension) Father   . Irritable bowel syndrome Father   . Colon polyps Father   . Depression Father   . Coronary Artery Disease (Blocked  arteries around heart) Father   . Polycystic ovary syndrome Sister   . Breast cancer Maternal Grandmother 5  . Diabetes type II Paternal Grandmother   . Breast cancer Paternal Grandmother 8  . Depression Paternal Grandmother   . Diabetes type II Paternal Grandfather   . High blood pressure (Hypertension) Paternal Grandfather   . Depression Paternal Grandfather   . Coronary Artery Disease (Blocked arteries around heart) Paternal Grandfather   . Colon cancer Neg Hx    Social History   Socioeconomic History  . Marital status: Married    Spouse name: Married  . Number of children: 2  . Years of education: 89  . Highest education level: Some college, no degree  Occupational History    Comment: looking for work 07/2011  Tobacco Use  . Smoking status: Never  . Smokeless tobacco: Never  Vaping Use  . Vaping status: Never Used  Substance and Sexual Activity  . Alcohol use: No  . Drug use: No  . Sexual activity: Yes    Partners: Male    Birth control/protection: I.U.D., None    Comment: Mirena   Other Topics Concern  . Would you please tell us  about the people who live in your home, your pets, or anything else important to your social life? No  Social History Narrative   Patient is married and has one 83-year old son.   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    Social Drivers of Health   Financial Resource Strain: Patient Declined (04/06/2023)   Overall Financial Resource Strain (CARDIA)   . Difficulty of Paying Living Expenses: Patient declined  Food Insecurity: No Food Insecurity (08/03/2023)   Received from [facility]   Hunger Vital Sign   . Worried About Programme Researcher, Broadcasting/film/video in the Last Year: Never true   . Ran Out of Food in the Last Year: Never true  Transportation Needs: No Transportation Needs (08/03/2023)   Received from [facility] -  Transportation   . Lack of Transportation (Medical): No   . Lack of Transportation (Non-Medical): No  Social Connections: Moderately Integrated (08/03/2023)   Received from [facility]   Social Connection and Isolation Panel [NHANES]   . Frequency of Communication with Friends and Family: More than three times a week   . Frequency of Social Gatherings with Friends and Family: Twice a week   . Attends Religious Services: 1 to 4 times per year   . Active Member of Clubs or Organizations: No   . Attends Banker Meetings: Never   . Marital Status: Married  Housing Stability: Unknown (08/21/2023)   Housing Stability Vital Sign   . Unable to Pay for Housing in the Last Year: Patient declined   . Number of Times Moved in the Last Year: 0   . Homeless in the Last Year: No   Review of Systems  Gastrointestinal:  Positive for abdominal pain and nausea.  All other systems reviewed and are negative.   Physical Exam  BP (!) 127/114 (BP Location: Right upper arm, Patient Position: Sitting)   Pulse 52   Temp 37 C (98.6 F) (Oral)   Resp 22   SpO2 100%  Physical Exam Vitals and nursing note reviewed.  Constitutional:      Appearance: She is well-developed.  HENT:     Head: Normocephalic and atraumatic.  Eyes:     Conjunctiva/sclera: Conjunctivae normal.  Cardiovascular:     Rate and Rhythm: Normal rate and regular rhythm.     Pulses: Normal pulses.     Heart sounds: Normal heart sounds.  Pulmonary:     Effort: Pulmonary effort is normal.     Breath sounds: Normal breath sounds and air entry.  Abdominal:     Palpations: Abdomen is soft.     Tenderness: There is abdominal tenderness in the right lower quadrant.  Musculoskeletal:        General: Normal range of motion.     Cervical back: Neck supple.  Skin:    General: Skin is warm and dry.     Capillary Refill: Capillary refill takes less than 2 seconds.  Neurological:     General: No focal deficit present.     Mental  Status: She is alert and oriented to person, place, and time.  Psychiatric:        Attention and Perception: Attention normal.        Speech: Speech normal.     Procedures  Procedures   Medical Decision Making  MDM Recent Results (from the past 24 hours)  Lipase   Collection Time: 08/21/23  6:49 AM  Result Value Ref Range   Lipase 27 17 - 51 U/L  Comprehensive Metabolic Panel (CMP)   Collection Time: 08/21/23  6:49 AM  Result Value Ref Range   Sodium 135 135 - 145 mmol/L   Potassium 3.7 3.5 - 5.0 mmol/L   Chloride 109 (H) 98 - 108 mmol/L   Carbon Dioxide (CO2) 21 21 - 30 mmol/L   Urea Nitrogen (BUN) 13 7 - 20 mg/dL   Creatinine 0.5 0.4 - 1.0 mg/dL   Glucose 84 70 - 859 mg/dL   Calcium 8.0 (L) 8.7 - 10.2 mg/dL   AST (Aspartate Aminotransferase) 21 15 - 41 U/L   ALT (Alanine Aminotransferase) 23 10 - 39 U/L   Bilirubin, Total 0.5 0.4 - 1.5 mg/dL   Alk Phos (Alkaline Phosphatase) 88 24 - 110 U/L   Albumin 4.0 3.5 - 4.8 g/dL   Protein, Total 6.9 6.2 - 8.1 g/dL   Anion Gap 5 3 - 12 mmol/L   BUN/CREA Ratio 28 (H) 6 - 27   Glomerular Filtration Rate (eGFR)  123 mL/min/1.73sq m  HCG Blood Quantitative, Pregnancy Test   Collection Time: 08/21/23  6:49 AM  Result Value Ref Range   HCG Quant, Serum Pregnancy <5 mIU/mL  Complete Blood Count (CBC) with Differential   Collection Time: 08/21/23  6:49 AM  Result Value Ref Range   WBC (White Blood Cell Count) 6.3 3.2 - 9.8 x10^9/L   Hemoglobin 11.5 (L) 11.7 - 15.5 g/dL   Hematocrit 64.6 64.9 - 45.0 %   Platelets 343 150 - 450 x10^9/L   MCV (Mean Corpuscular Volume) 93 80 - 98 fL   MCH (Mean Corpuscular Hemoglobin) 30.4 26.5 - 34.0 pg   MCHC (Mean Corpuscular Hemoglobin Concentration) 32.6 31.0 - 36.0 %   RBC (Red Blood Cell Count) 3.78 3.77 -  5.16 x10^12/L   RDW-CV (Red Cell Distribution Width) 12.9 11.5 - 14.5 %   NRBC (Nucleated Red Blood Cell Count) 0.00 0 x10^9/L   NRBC % (Nucleated Red Blood Cell %) 0.0 %   MPV (Mean  Platelet Volume) 9.3 7.2 - 11.7 fL   Neutrophil Count 3.8 2.0 - 8.6 x10^9/L   Neutrophil % 59.9 37 - 80 %   Lymphocyte Count 1.8 0.6 - 4.2 x10^9/L   Lymphocyte % 28.9 10 - 50 %   Monocyte Count 0.4 0 - 0.9 x10^9/L   Monocyte % 6.9 0 - 12 %   Eosinophil Count 0.22 0 - 0.70 x10^9/L   Eosinophil % 3.5 0 - 7 %   Basophil Count 0.04 0 - 0.20 x10^9/L   Basophil % 0.6 0 - 2 %   Immature Granulocyte Count 0.01 <=0.06 x10^9/L   Immature Granulocyte % 0.2 <=0.7 %  Culture, Urine, Routine with Pyuria Screen (reflexed from Urinalysis)   Collection Time: 08/21/23  7:57 AM  Result Value Ref Range   See Comment     Color Light Yellow Colorless, Straw, Light Yellow, Yellow, Dark Yellow   Clarity Cloudy (!) Clear   Specific Gravity 1.024 1.005 - 1.030   pH, Urine 6.0 5.0 - 8.0   Protein, Urinalysis Trace (!) Negative   Glucose, Urinalysis Negative Negative   Ketones, Urinalysis Negative Negative   Blood, Urinalysis Negative Negative   Nitrite, Urinalysis Negative Negative   Leukocytes, Urinalysis Negative Negative   Bilirubin, Urinalysis Negative Negative   Urobilinogen, Urinalysis 0.2 0.2 - 1.0 mg/dL  POC Urine HCG Pregnancy Test   Collection Time: 08/21/23  8:19 AM  Result Value Ref Range   Preg Test, Ur Negative    CT abdomen pelvis with contrast  Result Date: 08/21/2023 EXAMINATION: CT abdomen pelvis with contrast DATE: 08/21/2023 INDICATION: Right lower quadrant pain. History of duodenal switch. COMPARISON: 08/02/2023, 06/23/2023 TECHNIQUE:  Contiguous axial images were obtained of the abdomen and pelvis with 100 mL Isovue -300 intravenous contrast. With dilute Gastrografin  enteric contrast. Dose reduction was obtained with Automatic Exposure Control (AEC) or, if AEC could not be utilized, by manual adjustment of the mA and/or kV according to patient size. FINDINGS: Normal heart size. Some atelectasis of the lung bases with no pleural fluid or consolidation. Postoperative changes from previous  bariatric surgery. There is no evidence of obstruction or inflammatory change. There is mild bowel distention at the surgical anastomosis in the lower abdomen/pelvis. No acute abnormality of the liver. Cholecystectomy without biliary dilatation. No pancreatitis. Spleen, adrenal glands, and kidneys show no acute changes. The ureters are nondilated. Urinary bladder is underdistended. Retroverted uterus. IUD in place. No significant free fluid. There is altered bowel anatomy due to previous operation with some bowel and colonic loops in the subdiaphragmatic region and the right in the cecum in the right upper quadrant. The appendix is not inflamed. The configuration of the bowel loops is overall similar compared to the prior exam. Normal caliber aorta. No enlarging adenopathy. No acute or destructive appearing osseous lesions. L5 pars defects with grade 1 anterolisthesis. IMPRESSION: -Postoperative changes from previous bariatric surgery. Currently there are no specific findings to suggest high-grade bowel obstruction. -Likely on a postoperative basis, there is altered gastrointestinal anatomy with the cecum located in the right upper quadrant. This is similar to the previous exam. Electronically Signed by:  Sybil Fairly, MD, [facility] Electronically Signed on:  08/21/2023 9:05 AM  Request for image library services  Result Date: 08/15/2023 Please  refer to the appropriate PACS to view images.   CT Abdomen Pelvis Reference Only  Result Date: 08/15/2023 This order has been auto-finalized.  Please see additional clinical documentation for result report.    10:51 AM Ms. Fors presents with right lower quadrant pain in the setting of recent partial small bowel obstruction as well as prior duodenal switch surgery.  Labs showed no acute abnormalities.  CT scan does not show evidence of bowel obstruction or other acute findings at this time.  She has been evaluated by the bariatric surgery service who will  admit for pain control and further evaluation.      Medications Administered in the Emergency Department   morphine  injection 4 mg (has no administration in time range) lactated ringers  bolus 1,000 mL (has no administration in time range) diatrizoate  meglumine -sodium (GASTROGRAFIN ) solution 30 mL (30 mLs PO/VT Given 08/21/23 0658) ondansetron  (PF) (ZOFRAN ) injection 4 mg (4 mg Intravenous Given 08/21/23 0701)   ED Clinical Impression  1. Right lower quadrant abdominal pain                 Almedia Prentice Rush, MD 08/21/23 1052

## 2023-09-12 ENCOUNTER — Emergency Department
Admission: EM | Admit: 2023-09-12 | Discharge: 2023-09-12 | Disposition: A | Discharge status: Home / Self Care | Attending: Emergency Medicine | Admitting: Emergency Medicine

## 2023-09-12 ENCOUNTER — Other Ambulatory Visit: Payer: Self-pay

## 2023-09-12 ENCOUNTER — Emergency Department

## 2023-09-12 ENCOUNTER — Encounter: Payer: Self-pay | Admitting: Emergency Medicine

## 2023-09-12 DIAGNOSIS — R1030 Lower abdominal pain, unspecified: Secondary | ICD-10-CM | POA: Diagnosis present

## 2023-09-12 DIAGNOSIS — K5901 Slow transit constipation: Secondary | ICD-10-CM | POA: Insufficient documentation

## 2023-09-12 DIAGNOSIS — I10 Essential (primary) hypertension: Secondary | ICD-10-CM | POA: Diagnosis not present

## 2023-09-12 DIAGNOSIS — E119 Type 2 diabetes mellitus without complications: Secondary | ICD-10-CM | POA: Insufficient documentation

## 2023-09-12 LAB — CBC
HCT: 33.8 % — ABNORMAL LOW (ref 36.0–46.0)
Hemoglobin: 11 g/dL — ABNORMAL LOW (ref 12.0–15.0)
MCH: 30.7 pg (ref 26.0–34.0)
MCHC: 32.5 g/dL (ref 30.0–36.0)
MCV: 94.4 fL (ref 80.0–100.0)
Platelets: 336 10*3/uL (ref 150–400)
RBC: 3.58 MIL/uL — ABNORMAL LOW (ref 3.87–5.11)
RDW: 13 % (ref 11.5–15.5)
WBC: 7.1 10*3/uL (ref 4.0–10.5)
nRBC: 0 % (ref 0.0–0.2)

## 2023-09-12 LAB — URINALYSIS, ROUTINE W REFLEX MICROSCOPIC
Bilirubin Urine: NEGATIVE
Glucose, UA: NEGATIVE mg/dL
Ketones, ur: NEGATIVE mg/dL
Leukocytes,Ua: NEGATIVE
Nitrite: NEGATIVE
Protein, ur: NEGATIVE mg/dL
Specific Gravity, Urine: 1.021 (ref 1.005–1.030)
pH: 5 (ref 5.0–8.0)

## 2023-09-12 LAB — COMPREHENSIVE METABOLIC PANEL WITH GFR
ALT: 23 U/L (ref 0–44)
AST: 20 U/L (ref 15–41)
Albumin: 4.1 g/dL (ref 3.5–5.0)
Alkaline Phosphatase: 85 U/L (ref 38–126)
Anion gap: 6 (ref 5–15)
BUN: 12 mg/dL (ref 6–20)
CO2: 22 mmol/L (ref 22–32)
Calcium: 8.1 mg/dL — ABNORMAL LOW (ref 8.9–10.3)
Chloride: 109 mmol/L (ref 98–111)
Creatinine, Ser: 0.57 mg/dL (ref 0.44–1.00)
GFR, Estimated: >60 mL/min (ref >60)
Glucose, Bld: 90 mg/dL (ref 70–99)
Potassium: 3.8 mmol/L (ref 3.5–5.1)
Sodium: 137 mmol/L (ref 135–145)
Total Bilirubin: 0.6 mg/dL (ref 0.0–1.2)
Total Protein: 7.2 g/dL (ref 6.5–8.1)

## 2023-09-12 LAB — LIPASE, BLOOD: Lipase: 29 U/L (ref 11–51)

## 2023-09-12 LAB — POC URINE PREG, ED: Preg Test, Ur: NEGATIVE

## 2023-09-12 MED ORDER — OXYCODONE HCL 5 MG PO TABS: 5.0000 mg | ORAL_TABLET | Freq: Three times a day (TID) | ORAL | 0 refills | Status: DC | PRN

## 2023-09-12 MED ORDER — ONDANSETRON 4 MG PO TBDP: 4.0000 mg | ORAL_TABLET | Freq: Three times a day (TID) | ORAL | 0 refills | Status: DC | PRN

## 2023-09-12 MED ORDER — IOHEXOL 300 MG/ML  SOLN
100.0000 mL | Freq: Once | INTRAMUSCULAR | Status: AC | PRN
Start: 2023-09-12 — End: 2023-09-12
  Administered 2023-09-12: 100 mL via INTRAVENOUS

## 2023-09-12 MED ORDER — ONDANSETRON HCL 4 MG/2ML IJ SOLN
4.0000 mg | Freq: Once | INTRAMUSCULAR | Status: AC
Start: 2023-09-12 — End: 2023-09-12
  Administered 2023-09-12: 4 mg via INTRAVENOUS
  Filled 2023-09-12: qty 2

## 2023-09-12 MED ORDER — FENTANYL CITRATE PF 50 MCG/ML IJ SOSY
25.0000 ug | PREFILLED_SYRINGE | Freq: Once | INTRAMUSCULAR | Status: AC
  Administered 2023-09-12: 25 ug via INTRAVENOUS
  Filled 2023-09-12: qty 1

## 2023-09-12 NOTE — ED Triage Notes (Signed)
 Pt via POV from home. Pt c/o mid lower abd pain that started this AM, report NV. Denies any fevers. Denies urinary symptoms. Pt has an extensive abd surgery hx. Pt is A&Ox4 and NAD, ambulatory to triage.

## 2023-09-12 NOTE — ED Provider Notes (Signed)
 [facility] Emergency Department Provider Note     Event Date/Time   First MD Initiated Contact with Patient 09/12/23 2018     (approximate)   History   Abdominal Pain   HPI  Maria Fuller is a 38 y.o. female with a past medical history of PCOS, HTN, anemia, partial SBO and complete SBO and diabetes presents to the ED for evaluation of lower abdominal pain with nausea and vomiting x today.  Patient reports she has been admitted for small bowel obstructions on multiple occasions and just 2 weeks ago had a exploratory laparoscopy procedure performed.  Also has a history of bariatric surgery and has a scheduled appointment with her bariatric provider on Monday.  Denies fever and chills.  Denies urinary symptoms and changes in bowel movement.     Physical Exam   Triage Vital Signs: ED Triage Vitals  Encounter Vitals Group     BP 09/12/23 1842 (!) 142/72     Systolic BP Percentile --      Diastolic BP Percentile --      Pulse Rate 09/12/23 1842 (!) 56     Resp 09/12/23 1842 18     Temp 09/12/23 1842 98.4 F (36.9 C)     Temp Source 09/12/23 1842 Oral     SpO2 09/12/23 1842 100 %     Weight 09/12/23 1842 247 lb (112 kg)     Height 09/12/23 1842 6\' 1"  (1.854 m)     Head Circumference --      Peak Flow --      Pain Score 09/12/23 1841 7     Pain Loc --      Pain Education --      Exclude from Growth Chart --     Most recent vital signs: Vitals:   09/12/23 2100 09/12/23 2200  BP: 126/68 (!) 110/53  Pulse: (!) 55 70  Resp: 18 18  Temp:    SpO2: 99% 99%    General: Alert and oriented. INAD.  Head:  NCAT.  Eyes:  PERRLA. EOMI.  CV:  Good peripheral perfusion. RRR. No peripheral edema.  RESP:  Normal effort. LCTAB.  ABD:  No distention. Soft, tenderness to left lower and right lower quadrants.  No masses or organomegaly. No CVA tenderness bilaterally.   ED Results / Procedures / Treatments   Labs (all labs ordered are listed, but only  abnormal results are displayed) Labs Reviewed  COMPREHENSIVE METABOLIC PANEL WITH GFR - Abnormal; Notable for the following components:      Result Value   Calcium 8.1 (*)    All other components within normal limits  CBC - Abnormal; Notable for the following components:   RBC 3.58 (*)    Hemoglobin 11.0 (*)    HCT 33.8 (*)    All other components within normal limits  URINALYSIS, ROUTINE W REFLEX MICROSCOPIC - Abnormal; Notable for the following components:   Color, Urine YELLOW (*)    APPearance HAZY (*)    Hgb urine dipstick SMALL (*)    Bacteria, UA RARE (*)    All other components within normal limits  LIPASE, BLOOD  POC URINE PREG, ED   RADIOLOGY  I personally viewed and evaluated these images as part of my medical decision making, as well as reviewing the written report by the radiologist.  ED Provider Interpretation: No acute findings on CT abdomen pelvis.  CT ABDOMEN PELVIS W CONTRAST Result Date: 09/12/2023 CLINICAL DATA:  Abdominal  pain. Bowel obstruction suspected. Patient reports prior abdominal surgeries. EXAM: CT ABDOMEN AND PELVIS WITH CONTRAST TECHNIQUE: Multidetector CT imaging of the abdomen and pelvis was performed using the standard protocol following bolus administration of intravenous contrast. RADIATION DOSE REDUCTION: This exam was performed according to the departmental dose-optimization program which includes automated exposure control, adjustment of the mA and/or kV according to patient size and/or use of iterative reconstruction technique. CONTRAST:  OMNIPAQUE  IOHEXOL  300 MG/ML  SOLN COMPARISON:  Multiple prior exams, most recently 08/10/2023. FINDINGS: Lower chest: Mild hypoventilatory changes dependently. No pleural effusion. Hepatobiliary: Mild diffuse hepatic steatosis. No focal liver abnormality. Clips in the gallbladder fossa postcholecystectomy. No biliary dilatation. Pancreas: No ductal dilatation or inflammation. Spleen: Normal in size without  focal abnormality. Adrenals/Urinary Tract: Normal adrenal glands. No hydronephrosis, renal calculi, or renal inflammation. Left renal cyst. No further follow-up imaging is recommended. Partially distended urinary bladder, normal for degree of distension. Stomach/Bowel: Small hiatal hernia. Prior bariatric surgery. No small bowel distension or evidence of obstruction. There is fecalization of small bowel contents in the pelvis. Multiple enteric sutures within pelvic bowel loops. Moderate diffuse colonic stool burden with colonic redundancy. Colonic interposition under the right hemidiaphragm as before. The appendix is not well demonstrated on the current exam, no evidence of appendicitis. Vascular/Lymphatic: No acute vascular findings. Patent portal vein. No abdominopelvic adenopathy. Reproductive: Anteverted uterus with IUD in place.  No adnexal mass. Other: Postsurgical change of the anterior abdominal wall. No free air or ascites. 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 No acute osseous findings. IMPRESSION: 1. No acute abnormality in the abdomen/pelvis. No bowel obstruction. 2. Moderate diffuse colonic stool burden with fecalization of small bowel contents in the pelvis, suggesting constipation and slow transit. 3. Mild hepatic steatosis. 4. Small hiatal hernia. Prior bariatric surgery. 5. 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 Electronically Signed   By: Chadwick Colonel M.D.   On: 09/12/2023 22:17    PROCEDURES:  Critical Care performed: No  Procedures   MEDICATIONS ORDERED IN ED: Medications  fentaNYL  (SUBLIMAZE ) injection 25 mcg (25 mcg Intravenous Given 09/12/23 2043)  ondansetron  (ZOFRAN ) injection 4 mg (4 mg Intravenous Given 09/12/23 2044)  iohexol  (OMNIPAQUE ) 300 MG/ML solution 100 mL (100 mLs Intravenous Contrast Given 09/12/23 2140)     IMPRESSION / MDM /  ASSESSMENT AND PLAN / ED COURSE  I reviewed the triage vital signs and the nursing notes.                               38 y.o. female presents to the emergency department for evaluation and treatment of acute lower abdominal pain with associated nausea and vomiting x 1 day. See HPI for further details.   Differential diagnosis includes, but is not limited to, acute appendicitis, diverticulitis, urinary tract infection/pyelonephritis, bowel obstruction, gastroenteritis, hernia   Patient's presentation is most consistent with acute complicated illness / injury requiring diagnostic workup.  Patient is alert and oriented.  She is hemodynamic stable and afebrile.  Physical exam findings are stated above and pertinent for lower abdominal pain in bilateral quadrants to palpation.  Given her extensive history of abdominal surgeries will obtain a CT abdomen pelvis to rule out SBO given surgical history of s/p exploratory laparotomy.  Labs are reassuring.  Urinalysis reveals small Hgb but otherwise unremarkable.  Lipase normal.  Pregnancy is negative.  ED pain management with IV fentanyl .  Patient reports she was given morphine  in the past and had a reaction -red pruritic rash.  CT is reassuring.  Discussed findings of constipation and patient has a regimen at home to treat this.  I encouraged patient to continue her schedule appointment with her bariatric specialist on Monday for follow-up.  She verbalized understanding.  She is in stable condition for discharge home at this time.  ED return precautions discussed.  All questions and concerns were addressed during this ED visit.  FINAL CLINICAL IMPRESSION(S) / ED DIAGNOSES   Final diagnoses:  Lower abdominal pain  Slow transit constipation     Rx / DC Orders   ED Discharge Orders          Ordered    oxyCODONE  (ROXICODONE ) 5 MG immediate release tablet  Every 8 hours PRN        09/12/23 2234    ondansetron  (ZOFRAN -ODT) 4 MG disintegrating  tablet  Every 8 hours PRN        09/12/23 2235             Note:  This document was prepared using Dragon voice recognition software and may include unintentional dictation errors.    Phyllis Breeze, [place] Lesure A, PA-C 09/12/23 2340    Kandee Orion, MD 09/13/23 1550

## 2023-09-12 NOTE — Discharge Instructions (Addendum)
 You were evaluated in the ED for abdominal pain.  Your CT abdomen and pelvis did not show any acute life-threatening injury or illness and was otherwise normal.  Please continue to go to your scheduled appointment with your bariatric provider on Monday for further management.   Continue regimen for constipation.  Return to ED if symptoms worsen.

## 2023-09-12 NOTE — ED Notes (Signed)
 Pt has been taking zofran  all day.. last time was around 1600

## 2023-09-17 ENCOUNTER — Encounter: Payer: Self-pay | Admitting: Surgery

## 2023-09-26 ENCOUNTER — Emergency Department
Admission: EM | Admit: 2023-09-26 | Discharge: 2023-09-26 | Disposition: A | Discharge status: Home / Self Care | Attending: Emergency Medicine | Admitting: Emergency Medicine

## 2023-09-26 ENCOUNTER — Other Ambulatory Visit: Payer: Self-pay

## 2023-09-26 ENCOUNTER — Emergency Department

## 2023-09-26 DIAGNOSIS — R1031 Right lower quadrant pain: Secondary | ICD-10-CM | POA: Diagnosis present

## 2023-09-26 DIAGNOSIS — R1110 Vomiting, unspecified: Secondary | ICD-10-CM | POA: Diagnosis not present

## 2023-09-26 DIAGNOSIS — K5900 Constipation, unspecified: Secondary | ICD-10-CM | POA: Diagnosis not present

## 2023-09-26 DIAGNOSIS — R1084 Generalized abdominal pain: Secondary | ICD-10-CM | POA: Diagnosis not present

## 2023-09-26 DIAGNOSIS — E119 Type 2 diabetes mellitus without complications: Secondary | ICD-10-CM | POA: Insufficient documentation

## 2023-09-26 DIAGNOSIS — I10 Essential (primary) hypertension: Secondary | ICD-10-CM | POA: Insufficient documentation

## 2023-09-26 LAB — COMPREHENSIVE METABOLIC PANEL WITH GFR
ALT: 21 U/L (ref 0–44)
AST: 21 U/L (ref 15–41)
Albumin: 4 g/dL (ref 3.5–5.0)
Alkaline Phosphatase: 87 U/L (ref 38–126)
Anion gap: 9 (ref 5–15)
BUN: 12 mg/dL (ref 6–20)
CO2: 19 mmol/L — ABNORMAL LOW (ref 22–32)
Calcium: 8.5 mg/dL — ABNORMAL LOW (ref 8.9–10.3)
Chloride: 110 mmol/L (ref 98–111)
Creatinine, Ser: 0.66 mg/dL (ref 0.44–1.00)
GFR, Estimated: >60 mL/min (ref >60)
Glucose, Bld: 90 mg/dL (ref 70–99)
Potassium: 4.2 mmol/L (ref 3.5–5.1)
Sodium: 138 mmol/L (ref 135–145)
Total Bilirubin: 0.7 mg/dL (ref 0.0–1.2)
Total Protein: 7 g/dL (ref 6.5–8.1)

## 2023-09-26 LAB — URINALYSIS, ROUTINE W REFLEX MICROSCOPIC
Bacteria, UA: NONE SEEN
Bilirubin Urine: NEGATIVE
Glucose, UA: NEGATIVE mg/dL
Ketones, ur: NEGATIVE mg/dL
Leukocytes,Ua: NEGATIVE
Nitrite: NEGATIVE
Protein, ur: NEGATIVE mg/dL
Specific Gravity, Urine: 1.018 (ref 1.005–1.030)
pH: 5 (ref 5.0–8.0)

## 2023-09-26 LAB — CBC
HCT: 32.7 % — ABNORMAL LOW (ref 36.0–46.0)
Hemoglobin: 10.9 g/dL — ABNORMAL LOW (ref 12.0–15.0)
MCH: 31.2 pg (ref 26.0–34.0)
MCHC: 33.3 g/dL (ref 30.0–36.0)
MCV: 93.7 fL (ref 80.0–100.0)
Platelets: 304 10*3/uL (ref 150–400)
RBC: 3.49 MIL/uL — ABNORMAL LOW (ref 3.87–5.11)
RDW: 13.8 % (ref 11.5–15.5)
WBC: 7.4 10*3/uL (ref 4.0–10.5)
nRBC: 0 % (ref 0.0–0.2)

## 2023-09-26 LAB — POC URINE PREG, ED: Preg Test, Ur: NEGATIVE

## 2023-09-26 LAB — LIPASE, BLOOD: Lipase: 32 U/L (ref 11–51)

## 2023-09-26 MED ORDER — SODIUM CHLORIDE 0.9 % IV SOLN
500.0000 mg | Freq: Once | INTRAVENOUS | Status: AC
  Administered 2023-09-26: 500 mg via INTRAVENOUS
  Filled 2023-09-26: qty 10

## 2023-09-26 MED ORDER — FENTANYL CITRATE PF 50 MCG/ML IJ SOSY
25.0000 ug | PREFILLED_SYRINGE | Freq: Once | INTRAMUSCULAR | Status: AC
  Administered 2023-09-26: 25 ug via INTRAVENOUS
  Filled 2023-09-26: qty 1

## 2023-09-26 MED ORDER — IOHEXOL 300 MG/ML  SOLN
100.0000 mL | Freq: Once | INTRAMUSCULAR | Status: AC | PRN
Start: 2023-09-26 — End: 2023-09-26
  Administered 2023-09-26: 100 mL via INTRAVENOUS

## 2023-09-26 NOTE — ED Notes (Signed)
 Rn to bedside to introduce self to pt. Pt is caox4, in no acute distress.

## 2023-09-26 NOTE — ED Triage Notes (Signed)
 C/O RLQ abd pain and vomiting today. Has hx of bowel obstruction.  Had CT scan one week ago with partial bowel obstruction .

## 2023-09-26 NOTE — ED Provider Notes (Signed)
 [facility] Provider Note   Event Date/Time   First MD Initiated Contact with Patient 09/26/23 1752     (approximate) History  Abdominal Pain  HPI Maria Fuller is a 38 y.o. female with a stated past medical history of type 2 diabetes, hypertension, and anxiety/depression with a surgical history of oophorectomy who presents complaining of right lower quadrant abdominal pain with associated vomiting today.  Patient states that she was seen recently and told that she had a partial bowel obstruction when she got to the admitting hospital she was told that there was a partial obstruction and that she would be fine being discharged.  Patient states that she has worsening pain similar to when she was diagnosed with an obstruction here today.  Patient endorses last bowel movement approximately 24 hours prior to arrival.  Patient denies any food intolerance and states she has had 1 episode of vomiting. ROS: Patient currently denies any vision changes, tinnitus, difficulty speaking, facial droop, sore throat, chest pain, shortness of breath, diarrhea, dysuria, or weakness/numbness/paresthesias in any extremity   Physical Exam  Triage Vital Signs: ED Triage Vitals  Encounter Vitals Group     BP 09/26/23 1746 139/73     Systolic BP Percentile --      Diastolic BP Percentile --      Pulse Rate 09/26/23 1746 66     Resp 09/26/23 1746 16     Temp 09/26/23 1746 98.6 F (37 C)     Temp Source 09/26/23 1746 Oral     SpO2 09/26/23 1746 99 %     Weight 09/26/23 1747 246 lb 14.6 oz (112 kg)     Height 09/26/23 2007 6\' 1"  (1.854 m)     Head Circumference --      Peak Flow --      Pain Score 09/26/23 1746 5     Pain Loc --      Pain Education --      Exclude from Growth Chart --    Most recent vital signs: Vitals:   09/26/23 1746  BP: 139/73  Pulse: 66  Resp: 16  Temp: 98.6 F (37 C)  SpO2: 99%   General: Awake, oriented x4. CV:  Good peripheral perfusion.   Resp:  Normal effort.  Abd:  No distention.  Other:  Middle-aged obese Caucasian female resting comfortably in no acute distress ED Results / Procedures / Treatments  Labs (all labs ordered are listed, but only abnormal results are displayed) Labs Reviewed  COMPREHENSIVE METABOLIC PANEL WITH GFR - Abnormal; Notable for the following components:      Result Value   CO2 19 (*)    Calcium 8.5 (*)    All other components within normal limits  CBC - Abnormal; Notable for the following components:   RBC 3.49 (*)    Hemoglobin 10.9 (*)    HCT 32.7 (*)    All other components within normal limits  URINALYSIS, ROUTINE W REFLEX MICROSCOPIC - Abnormal; Notable for the following components:   Color, Urine YELLOW (*)    APPearance CLEAR (*)    Hgb urine dipstick SMALL (*)    All other components within normal limits  LIPASE, BLOOD  POC URINE PREG, ED   RADIOLOGY ED MD interpretation: CT of the abdomen pelvis with IV contrast independently interpreted and shows evidence of constipation without any other acute intra-abdominal or intrapelvic abnormalities -Agree with radiology assessment Official radiology report(s): CT ABDOMEN PELVIS W CONTRAST Result Date:  09/26/2023 CLINICAL DATA:  Bowel obstruction suspected C/O RLQ abd pain and vomiting today. Has hx of bowel obstruction. Had CT scan one week ago with partial bowel obstruction EXAM: CT ABDOMEN AND PELVIS WITH CONTRAST TECHNIQUE: Multidetector CT imaging of the abdomen and pelvis was performed using the standard protocol following bolus administration of intravenous contrast. RADIATION DOSE REDUCTION: This exam was performed according to the departmental dose-optimization program which includes automated exposure control, adjustment of the mA and/or kV according to patient size and/or use of iterative reconstruction technique. CONTRAST:  OMNIPAQUE  IOHEXOL  300 MG/ML  SOLN COMPARISON:  CT abdomen pelvis 09/12/2023 FINDINGS: Lower chest: No  acute abnormality. Hepatobiliary: No focal liver abnormality. Status post cholecystectomy. No biliary dilatation. Pancreas: No focal lesion. Normal pancreatic contour. No surrounding inflammatory changes. No main pancreatic ductal dilatation. Spleen: Normal in size without focal abnormality.  Splenule noted. Adrenals/Urinary Tract: No adrenal nodule bilaterally. Bilateral kidneys enhance symmetrically. No hydronephrosis. No hydroureter. The urinary bladder is unremarkable. Stomach/Bowel: Prior bariatric surgery. Stomach is within normal limits. No evidence of bowel wall thickening or dilatation. Stool throughout the majority of the colon. Appendix appears normal. Vascular/Lymphatic: No abdominal aorta or iliac aneurysm. No abdominal, pelvic, or inguinal lymphadenopathy. Reproductive: T-shaped intern device in appropriate position within the uterus. Uterus and bilateral adnexa are unremarkable. Other: No intraperitoneal free fluid. No intraperitoneal free gas. No organized fluid collection. Musculoskeletal: No abdominal wall hernia or abnormality. No suspicious lytic or blastic osseous lesions. No acute displaced fracture. 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 Mild retrolisthesis of L4 on L5. IMPRESSION: 1. Constipation. 2. Otherwise no acute intra-abdominal intrapelvic abnormality in a patient status post bariatric surgery. Electronically Signed   By: Morgane  Naveau M.D.   On: 09/26/2023 19:38   PROCEDURES: Critical Care performed: No Procedures MEDICATIONS ORDERED IN ED: Medications  erythromycin  500 mg in sodium chloride  0.9 % 100 mL IVPB (0 mg Intravenous Stopped 09/26/23 2028)  fentaNYL  (SUBLIMAZE ) injection 25 mcg (25 mcg Intravenous Given 09/26/23 1812)  iohexol  (OMNIPAQUE ) 300 MG/ML solution 100 mL (100 mLs Intravenous Contrast Given 09/26/23 1847)   IMPRESSION / MDM / ASSESSMENT AND PLAN / ED COURSE  I reviewed the triage vital signs and the  nursing notes.                             The patient is on the cardiac monitor to evaluate for evidence of arrhythmia and/or significant heart rate changes. Patient's presentation is most consistent with acute presentation with potential threat to life or bodily function. Patient's history and exam most consistent with constipation as an etiology for their pain.  Patient's symptoms not typical for other emergent causes of abdominal pain such as, but not limited to, appendicitis, abdominal aortic aneurysm, pancreatitis, SBO, mesenteric ischemia, serious intra-abdominal bacterial illness.  Patient without red flags concerning for cancer as a constipation etiology.  Rx: Miralax   Disposition:  Patient will be discharged with strict return precautions and follow up with primary MD within 24-48 hours for further evaluation. Patient understands that this still may have an early presentation of an emergent medical condition such as appendicitis that will require a recheck.   FINAL CLINICAL IMPRESSION(S) / ED DIAGNOSES   Final diagnoses:  Generalized abdominal pain  Constipation, unspecified constipation type   Rx / DC Orders   ED Discharge Orders     None      Note:  This document was prepared using Dragon voice recognition software and may include unintentional dictation errors.   Daylen Lipsky K, MD 09/26/23 [phone]

## 2023-09-26 NOTE — Discharge Instructions (Signed)
 Please use MiraLAX one half capful every hour until your first bowel movement.  Please do not take any MiraLAX after this for at least 24 hours.  You may use one half capful twice a day of MiraLAX in order to have 1 solid well-formed bowel movement per day.  You may increase or decrease this dosage as needed to obtain this 1 well-formed bowel movement.  Please make sure that you are drinking at least 8 ounces of water every hour during this initial bowel regimen. ?

## 2023-10-28 ENCOUNTER — Other Ambulatory Visit: Payer: Self-pay

## 2023-10-28 ENCOUNTER — Encounter: Payer: Self-pay | Admitting: Emergency Medicine

## 2023-10-28 ENCOUNTER — Emergency Department

## 2023-10-28 ENCOUNTER — Emergency Department
Admission: EM | Admit: 2023-10-28 | Discharge: 2023-10-28 | Disposition: A | Discharge status: Home / Self Care | Attending: Emergency Medicine | Admitting: Emergency Medicine

## 2023-10-28 DIAGNOSIS — R112 Nausea with vomiting, unspecified: Secondary | ICD-10-CM | POA: Diagnosis not present

## 2023-10-28 DIAGNOSIS — R1031 Right lower quadrant pain: Secondary | ICD-10-CM | POA: Insufficient documentation

## 2023-10-28 DIAGNOSIS — D649 Anemia, unspecified: Secondary | ICD-10-CM | POA: Diagnosis not present

## 2023-10-28 DIAGNOSIS — E119 Type 2 diabetes mellitus without complications: Secondary | ICD-10-CM | POA: Insufficient documentation

## 2023-10-28 DIAGNOSIS — I10 Essential (primary) hypertension: Secondary | ICD-10-CM | POA: Diagnosis not present

## 2023-10-28 LAB — COMPREHENSIVE METABOLIC PANEL WITH GFR
ALT: 23 U/L (ref 0–44)
AST: 21 U/L (ref 15–41)
Albumin: 4 g/dL (ref 3.5–5.0)
Alkaline Phosphatase: 90 U/L (ref 38–126)
Anion gap: 8 (ref 5–15)
BUN: 10 mg/dL (ref 6–20)
CO2: 21 mmol/L — ABNORMAL LOW (ref 22–32)
Calcium: 8.5 mg/dL — ABNORMAL LOW (ref 8.9–10.3)
Chloride: 108 mmol/L (ref 98–111)
Creatinine, Ser: 0.53 mg/dL (ref 0.44–1.00)
GFR, Estimated: >60 mL/min (ref >60)
Glucose, Bld: 79 mg/dL (ref 70–99)
Potassium: 4 mmol/L (ref 3.5–5.1)
Sodium: 137 mmol/L (ref 135–145)
Total Bilirubin: 0.9 mg/dL (ref 0.0–1.2)
Total Protein: 7.2 g/dL (ref 6.5–8.1)

## 2023-10-28 LAB — URINALYSIS, ROUTINE W REFLEX MICROSCOPIC
Bacteria, UA: NONE SEEN
Bilirubin Urine: NEGATIVE
Glucose, UA: NEGATIVE mg/dL
Hgb urine dipstick: NEGATIVE
Ketones, ur: NEGATIVE mg/dL
Leukocytes,Ua: NEGATIVE
Nitrite: NEGATIVE
Protein, ur: 30 mg/dL — AB
Specific Gravity, Urine: 1.028 (ref 1.005–1.030)
pH: 5 (ref 5.0–8.0)

## 2023-10-28 LAB — POC URINE PREG, ED: Preg Test, Ur: NEGATIVE

## 2023-10-28 LAB — CBC
HCT: 35 % — ABNORMAL LOW (ref 36.0–46.0)
Hemoglobin: 11.2 g/dL — ABNORMAL LOW (ref 12.0–15.0)
MCH: 30.4 pg (ref 26.0–34.0)
MCHC: 32 g/dL (ref 30.0–36.0)
MCV: 94.9 fL (ref 80.0–100.0)
Platelets: 383 10*3/uL (ref 150–400)
RBC: 3.69 MIL/uL — ABNORMAL LOW (ref 3.87–5.11)
RDW: 13.5 % (ref 11.5–15.5)
WBC: 6.7 10*3/uL (ref 4.0–10.5)
nRBC: 0 % (ref 0.0–0.2)

## 2023-10-28 LAB — LIPASE, BLOOD: Lipase: 24 U/L (ref 11–51)

## 2023-10-28 MED ORDER — OXYCODONE-ACETAMINOPHEN 5-325 MG PO TABS: 1.0000 | ORAL_TABLET | ORAL | 0 refills | Status: DC | PRN

## 2023-10-28 MED ORDER — FENTANYL CITRATE PF 50 MCG/ML IJ SOSY
50.0000 ug | PREFILLED_SYRINGE | Freq: Once | INTRAMUSCULAR | Status: AC
  Administered 2023-10-28: 50 ug via INTRAVENOUS
  Filled 2023-10-28: qty 1

## 2023-10-28 MED ORDER — IOHEXOL 300 MG/ML  SOLN
100.0000 mL | Freq: Once | INTRAMUSCULAR | Status: AC | PRN
  Administered 2023-10-28: 100 mL via INTRAVENOUS

## 2023-10-28 MED ORDER — MORPHINE SULFATE (PF) 4 MG/ML IV SOLN
4.0000 mg | Freq: Once | INTRAVENOUS | Status: AC
  Administered 2023-10-28: 4 mg via INTRAVENOUS
  Filled 2023-10-28: qty 1

## 2023-10-28 MED ORDER — ONDANSETRON HCL 4 MG/2ML IJ SOLN
4.0000 mg | Freq: Once | INTRAMUSCULAR | Status: AC
Start: 2023-10-28 — End: 2023-10-28
  Administered 2023-10-28: 4 mg via INTRAVENOUS
  Filled 2023-10-28: qty 2

## 2023-10-28 NOTE — ED Provider Notes (Signed)
 [facility] Provider Note    Event Date/Time   First MD Initiated Contact with Patient 10/28/23 1710     (approximate)   History   Abdominal Pain   HPI  Maria Fuller is a 38 y.o. female with PMH of multiple abdominal surgeries, small bowel obstruction, PCOS, diabetes, hypertension who presents for evaluation of right lower quadrant pain and 1 episode of vomiting.  Patient states that she has had multiple bowel obstructions in the past, most recently it was a partial bowel obstruction.  She has not had a bowel movement since her pain began.  She has follow-up scheduled with GI for the end of August to determine what may be causing the bowel obstructions.  She also has follow-up scheduled with pain management for tomorrow to help treat the chronic right lower quadrant pain.  Patient presented today due to an acute worsening of the pain and the vomiting.      Physical Exam   Triage Vital Signs: ED Triage Vitals  Encounter Vitals Group     BP 10/28/23 1624 (!) 140/86     Girls Systolic BP Percentile --      Girls Diastolic BP Percentile --      Boys Systolic BP Percentile --      Boys Diastolic BP Percentile --      Pulse Rate 10/28/23 1624 66     Resp 10/28/23 1624 18     Temp 10/28/23 1624 98.4 F (36.9 C)     Temp Source 10/28/23 1624 Oral     SpO2 10/28/23 1624 100 %     Weight --      Height --      Head Circumference --      Peak Flow --      Pain Score 10/28/23 1622 8     Pain Loc --      Pain Education --      Exclude from Growth Chart --     Most recent vital signs: Vitals:   10/28/23 1624 10/28/23 1800  BP: (!) 140/86 120/72  Pulse: 66 (!) 57  Resp: 18 18  Temp: 98.4 F (36.9 C)   SpO2: 100% 100%   General: Awake, no distress.  CV:  Good peripheral perfusion.  RRR. Resp:  Normal effort.  CTAB. Abd:  No distention.  Soft, tender to palpation across the middle of the abdomen, no right lower quadrant pain on  palpation. Other:  Right-sided CVA tenderness.   ED Results / Procedures / Treatments   Labs (all labs ordered are listed, but only abnormal results are displayed) Labs Reviewed  COMPREHENSIVE METABOLIC PANEL WITH GFR - Abnormal; Notable for the following components:      Result Value   CO2 21 (*)    Calcium 8.5 (*)    All other components within normal limits  CBC - Abnormal; Notable for the following components:   RBC 3.69 (*)    Hemoglobin 11.2 (*)    HCT 35.0 (*)    All other components within normal limits  URINALYSIS, ROUTINE W REFLEX MICROSCOPIC - Abnormal; Notable for the following components:   Color, Urine YELLOW (*)    APPearance HAZY (*)    Protein, ur 30 (*)    All other components within normal limits  LIPASE, BLOOD  POC URINE PREG, ED    RADIOLOGY  CT abdomen pelvis obtained, interpreted the images as well as reviewed the radiologist report.  No acute abnormalities on CT  scan, no bowel obstruction.   PROCEDURES:  Critical Care performed: No  Procedures   MEDICATIONS ORDERED IN ED: Medications  fentaNYL  (SUBLIMAZE ) injection 50 mcg (has no administration in time range)  morphine  (PF) 4 MG/ML injection 4 mg (4 mg Intravenous Given 10/28/23 1752)  ondansetron  (ZOFRAN ) injection 4 mg (4 mg Intravenous Given 10/28/23 1752)  iohexol  (OMNIPAQUE ) 300 MG/ML solution 100 mL (100 mLs Intravenous Contrast Given 10/28/23 1830)     IMPRESSION / MDM / ASSESSMENT AND PLAN / ED COURSE  I reviewed the triage vital signs and the nursing notes.                             38 year old female presents for evaluation of abdominal pain.  Blood pressure is elevated but patient does have history of hypertension and is quite uncomfortable on exam.  Vital signs stable otherwise.  Differential diagnosis includes, but is not limited to, appendicitis, SBO, acute on chronic pain, gastritis, biliary disease, constipation.  Patient's presentation is most consistent with acute  complicated illness / injury requiring diagnostic workup.  Will obtain labs and urinalysis as well as CT scan to rule out SBO.  Patient was given morphine  for pain and Zofran  for nausea.   Clinical Course as of 10/28/23 2026  Wed Oct 28, 2023  1750 CBC(!) Mild anemia, at patient's baseline. [LD]  1750 Lipase, blood Not elevated, low suspicion for pancreatitis as cause of pain. [LD]  1750 Comprehensive metabolic panel(!) Unremarkable, no electrolyte abnormalities. [LD]  1751 Urinalysis, Routine w reflex microscopic -Urine, Clean Catch(!) Protein present otherwise normal. [LD]  1751 POC urine preg, ED Negative pregnancy test. [LD]  1815 Lipase, blood [LD]  1858 CT ABDOMEN PELVIS W CONTRAST No acute abnormalities, no bowel obstruction. [LD]  2025 Patient reports that she is still having pain so we will give her another dose of pain medication.  Blood work and imaging are both reassuring.  Patient has a follow-up with pain management tomorrow.  She also has follow-up scheduled with GI.  Emphasized the importance of keeping these appointments.  We discussed return precautions.  Will send a short course of pain medication and then will leave further prescriptions up to pain management.  Patient voiced understanding, all questions were answered and she stable at discharge. [LD]    Clinical Course User Index [LD] Cleaster Tinnie LABOR, PA-C     FINAL CLINICAL IMPRESSION(S) / ED DIAGNOSES   Final diagnoses:  Right lower quadrant abdominal pain     Rx / DC Orders   ED Discharge Orders          Ordered    oxyCODONE -acetaminophen  (PERCOCET) 5-325 MG tablet  Every 4 hours PRN        10/28/23 2021             Note:  This document was prepared using Dragon voice recognition software and may include unintentional dictation errors.   Cleaster Tinnie LABOR, PA-C 10/28/23 2026    Dorothyann Drivers, MD 10/29/23 1904

## 2023-10-28 NOTE — ED Notes (Signed)
 Pt returned from scan.

## 2023-10-28 NOTE — ED Triage Notes (Signed)
 Pt with constant RLQ pain and one episode of vomiting.  Pt reports hx of multiple bowel obstructions in the past.

## 2023-10-28 NOTE — Discharge Instructions (Addendum)
 You blood work and CT scan was normal today, no bowel obstruction. Please follow up with pain management and GI. Return to the ED with any worsening symptoms.  You can take 650 mg of Tylenol  and 600 mg of ibuprofen  every 6 hours as needed for pain. You can use ice, heat, muscle creams and other topical pain relievers as well.  I have sent a strong pain medication called Percocet to the pharmacy.  This medication can be taken every 4-6 hours as needed for severe or breakthrough pain.  This medication can cause dependency so only take if you are unable to control your pain with other medications.  It will make you sleepy so do not drive or operate heavy machinery after taking it.  Do not drink alcohol while taking this medication.  This medication can also cause constipation so please take an over-the-counter stool softener like MiraLAX  or Colace while taking it.  This medication contains both oxycodone  and acetaminophen .  Do not take Tylenol  at the same time.  You can take the Percocet or Tylenol  but not both.

## 2023-11-06 ENCOUNTER — Emergency Department

## 2023-11-06 ENCOUNTER — Emergency Department
Admission: EM | Admit: 2023-11-06 | Discharge: 2023-11-06 | Disposition: A | Discharge status: Home / Self Care | Attending: Emergency Medicine | Admitting: Emergency Medicine

## 2023-11-06 DIAGNOSIS — R1031 Right lower quadrant pain: Secondary | ICD-10-CM | POA: Insufficient documentation

## 2023-11-06 LAB — COMPREHENSIVE METABOLIC PANEL WITH GFR
ALT: 21 U/L (ref 0–44)
AST: 24 U/L (ref 15–41)
Albumin: 3.9 g/dL (ref 3.5–5.0)
Alkaline Phosphatase: 94 U/L (ref 38–126)
Anion gap: 9 (ref 5–15)
BUN: 12 mg/dL (ref 6–20)
CO2: 21 mmol/L — ABNORMAL LOW (ref 22–32)
Calcium: 8.3 mg/dL — ABNORMAL LOW (ref 8.9–10.3)
Chloride: 108 mmol/L (ref 98–111)
Creatinine, Ser: 0.5 mg/dL (ref 0.44–1.00)
GFR, Estimated: >60 mL/min (ref >60)
Glucose, Bld: 75 mg/dL (ref 70–99)
Potassium: 4 mmol/L (ref 3.5–5.1)
Sodium: 138 mmol/L (ref 135–145)
Total Bilirubin: 0.5 mg/dL (ref 0.0–1.2)
Total Protein: 6.8 g/dL (ref 6.5–8.1)

## 2023-11-06 LAB — CBC
HCT: 35.4 % — ABNORMAL LOW (ref 36.0–46.0)
Hemoglobin: 11.3 g/dL — ABNORMAL LOW (ref 12.0–15.0)
MCH: 30.8 pg (ref 26.0–34.0)
MCHC: 31.9 g/dL (ref 30.0–36.0)
MCV: 96.5 fL (ref 80.0–100.0)
Platelets: 319 K/uL (ref 150–400)
RBC: 3.67 MIL/uL — ABNORMAL LOW (ref 3.87–5.11)
RDW: 13.5 % (ref 11.5–15.5)
WBC: 6.5 K/uL (ref 4.0–10.5)
nRBC: 0 % (ref 0.0–0.2)

## 2023-11-06 LAB — POC URINE PREG, ED: Preg Test, Ur: NEGATIVE

## 2023-11-06 LAB — URINALYSIS, ROUTINE W REFLEX MICROSCOPIC
Bilirubin Urine: NEGATIVE
Glucose, UA: NEGATIVE mg/dL
Hgb urine dipstick: NEGATIVE
Ketones, ur: NEGATIVE mg/dL
Leukocytes,Ua: NEGATIVE
Nitrite: NEGATIVE
Protein, ur: NEGATIVE mg/dL
Specific Gravity, Urine: 1.008 (ref 1.005–1.030)
pH: 6 (ref 5.0–8.0)

## 2023-11-06 LAB — LIPASE, BLOOD: Lipase: 25 U/L (ref 11–51)

## 2023-11-06 MED ORDER — ONDANSETRON HCL 4 MG/2ML IJ SOLN
4.0000 mg | INTRAMUSCULAR | Status: AC
  Administered 2023-11-06: 4 mg via INTRAVENOUS
  Filled 2023-11-06: qty 2

## 2023-11-06 MED ORDER — SODIUM CHLORIDE 0.9 % IV BOLUS
500.0000 mL | Freq: Once | INTRAVENOUS | Status: AC
  Administered 2023-11-06: 500 mL via INTRAVENOUS

## 2023-11-06 MED ORDER — MORPHINE SULFATE (PF) 4 MG/ML IV SOLN
4.0000 mg | Freq: Once | INTRAVENOUS | Status: AC
  Administered 2023-11-06: 4 mg via INTRAVENOUS
  Filled 2023-11-06: qty 1

## 2023-11-06 NOTE — Discharge Instructions (Addendum)
 You were seen in the emergency room for abdominal pain. It is important that you follow up closely with your primary care doctor in the next couple of days.  As we discussed it will be very important for you to come back if your symptoms worsen or not improving.  I suspect your symptoms today are related to your chronic pain and I do not see evidence of a bowel blockage or obvious infection however if your symptoms worsen you begin vomiting your pain is not well-controlled, or other symptoms arise please return to the ER right away.  No driving today.  Please return to the emergency room right away if you are to develop a fever, severe nausea, your pain becomes severe or worsens, you are unable to keep food down, begin vomiting any dark or bloody fluid, you develop any dark or bloody stools, feel dehydrated, or other new concerns or symptoms arise.

## 2023-11-06 NOTE — ED Provider Notes (Signed)
 [facility] Provider Note    Event Date/Time   First MD Initiated Contact with Patient 11/06/23 1341     (approximate)   History   Abdominal Pain   HPI  Maria Fuller is a 38 y.o. 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  Patient reports that she is having right lower abdominal pain.  She has had this pain many times in the past.   Patient reports that she will have these flareups of pain but in the past has had a couple occasions where she had bowel obstruction.  Frequently they will go away with bowel rest but she has had at least a couple of surgeries to manage them.  She did vomit once this morning  She reports that she had similar symptoms about 2 weeks ago but improved after coming to the ER.  The pain is an intermittent sort of sharp discomfort.  She is still having normal bowel movements, last yesterday and passing gas normally today    Patient tells me that she has had several discussions with her primary care doctor, in the past her doctors told her that getting so many CT scans could lead to problems from the radiation.     Physical Exam   Triage Vital Signs: ED Triage Vitals  Encounter Vitals Group     BP 11/06/23 1151 132/78     Girls Systolic BP Percentile --      Girls Diastolic BP Percentile --      Boys Systolic BP Percentile --      Boys Diastolic BP Percentile --      Pulse Rate 11/06/23 1151 60     Resp 11/06/23 1151 16     Temp 11/06/23 1151 97.9 F (36.6 C)     Temp Source 11/06/23 1151 Oral     SpO2 11/06/23 1151 100 %     Weight 11/06/23 1043 260 lb (117.9 kg)     Height 11/06/23 1043 6' 1 (1.854 m)     Head Circumference --      Peak Flow --      Pain Score 11/06/23 1043 8     Pain Loc --      Pain Education --      Exclude from Growth Chart --     Most recent vital signs: Vitals:   11/06/23 1151 11/06/23  1429  BP: 132/78 136/82  Pulse: 60 (!) 56  Resp: 16 18  Temp: 97.9 F (36.6 C)   SpO2: 100% 100%     General: Awake, no distress.  She is pleasant appears in some pain holding her lower abdomen CV:  Good peripheral perfusion.  Warm well-perfused Resp:  Normal effort.  Abd:  No distention.  Soft relatively nontender nondistended.  She reports mild tenderness along the right lower quadrant but no rebound or guarding.  There is no hernias or masses apparent at this time.  Several old surgical scars including multiple trocar sites Other:     ED Results / Procedures / Treatments   Labs (all labs ordered are listed, but only abnormal results are displayed) Labs Reviewed  COMPREHENSIVE METABOLIC PANEL WITH GFR - Abnormal; Notable for the following components:      Result Value   CO2 21 (*)    Calcium 8.3 (*)    All other components within normal limits  CBC - Abnormal; Notable for the following components:  RBC 3.67 (*)    Hemoglobin 11.3 (*)    HCT 35.4 (*)    All other components within normal limits  LIPASE, BLOOD  URINALYSIS, ROUTINE W REFLEX MICROSCOPIC  POC URINE PREG, ED     RADIOLOGY   Abdominal plain film interpreted as negative for acute, interpreted by me  DG Abd 2 Views Result Date: 11/06/2023 CLINICAL DATA:  None abdominal pain. History of bowel obstruction. Right lower quadrant abdominal pain with nausea and vomiting. Previous weight loss surgery. EXAM: ABDOMEN - 2 VIEW COMPARISON:  Radiographs 06/23/2023 and CT 10/28/2023. FINDINGS: 3 supine views of the abdomen are submitted. There is a normal, nonobstructive bowel gas pattern. No supine evidence of pneumoperitoneum. Scattered surgical clips are noted, including cholecystectomy clips. There is an intrauterine device. The bones appear unremarkable. IMPRESSION: No evidence of bowel obstruction or other acute process. Electronically Signed   By: Elsie Perone M.D.   On: 11/06/2023 14:21       PROCEDURES:  Critical Care performed: No  Procedures   MEDICATIONS ORDERED IN ED: Medications  morphine  (PF) 4 MG/ML injection 4 mg (4 mg Intravenous Given 11/06/23 1429)  ondansetron  (ZOFRAN ) injection 4 mg (4 mg Intravenous Given 11/06/23 1429)  sodium chloride  0.9 % bolus 500 mL (500 mLs Intravenous New Bag/Given 11/06/23 1428)     IMPRESSION / MDM / ASSESSMENT AND PLAN / ED COURSE  I reviewed the triage vital signs and the nursing notes.                              Differential diagnosis includes, but is not limited to, recurrent obstruction, urinary tract infection, appendicitis, recurrent bowel obstruction, internal hernia etc.  The patient is a lengthy complex abdominal history.  She also has had multiple CT scans recently  I discussed with the patient the risks and benefits of abdominal CT scan. The present time there is no clear indication that the patient requires CT, the patient does have an abdominal complaint but exam does not suggest acute surgical abdomen and my suspicion for intra-abdominal infection including appendicitis, cholecystitis, aaa, dissection, ischemia, perforation, pancreatitis, diverticulitis or other acute major intra-abdominal process is quite low and she has had multiple CTs of the abdomen for slimilar in the past 2 months. After discussing the risks and benefits including benefits of additional evaluation for diagnoses, ruling out infection/perforation/obstruction/etc, but also discussing the risks including low, but not 0 risk of inducing cancers due to radiation and potential risks of contrast the patient indicated via our shared medical decision-making that she would not do a CAT scan. Rather if the patient does have worsening symptoms, develops a high fever, develops pain or persistent discomfort in the right upper quadrant or right lower quadrant, or other new concerns arise they will come back to emergency room right away. As the patient's clinician  I think this is a very reasonable decision having discussed general risks and benefits of CT, and my clinical suspicion that CT would be of benefit at this time is very low.   Patient's presentation is most consistent with acute complicated illness / injury requiring diagnostic workup.  Patient's labs very reassuring.  Very mild anemia.  No signs or symptoms of suggestive of acute bleeding.  I reexamined and reevaluated patient approximately 3:15 PM.  She relates that the morphine  has helped some she had no further emesis, discussed her x-ray result and patient reports that she feels comfortable going home  will return if symptoms worsen.  In particular she understands that we cannot fully rule out obstruction or other cause but my suspicion is low given her previous history and recurrent symptomatology I suspect this is likely more of a chronic pain type component with no obvious evidence of this obstruction at this time.  Afebrile normal white count clinical history not highly supportive of acute concern for acute infectious intra-abdominal cause such as appendicitis  Return precautions and treatment recommendations and follow-up discussed with the patient who is agreeable with the plan.         FINAL CLINICAL IMPRESSION(S) / ED DIAGNOSES   Final diagnoses:  Colicky RLQ abdominal pain     Rx / DC Orders   ED Discharge Orders     None        Note:  This document was prepared using Dragon voice recognition software and may include unintentional dictation errors.   Dicky Anes, MD 11/06/23 [phone]

## 2023-11-06 NOTE — ED Triage Notes (Signed)
 Pt to ED from home with RLQ abdominal pain/ N/V. Pt states she has a hx of small bowel obs since weight loss surgery and has not had a bowel movement since yesterday morning and began to vomit this morning after being seen here last week for the same.   A&O x4 Resp even unlabored

## 2023-11-06 NOTE — ED Provider Notes (Signed)
-----------------------------------------   3:41 PM on 11/06/2023 -----------------------------------------  I took over care of this patient from Dr. Dicky.  The patient was planned for discharge once her urinalysis resulted and Dr. Dicky had discussed this with the patient.  The urinalysis is negative.  The patient is stable for discharge home at this time.  Return precautions and discharge instructions have been provided.   Jacolyn Pae, MD 11/06/23 [phone]

## 2023-11-17 ENCOUNTER — Other Ambulatory Visit: Payer: Self-pay

## 2023-11-17 ENCOUNTER — Emergency Department
Admission: EM | Admit: 2023-11-17 | Discharge: 2023-11-17 | Disposition: A | Discharge status: Home / Self Care | Attending: Emergency Medicine | Admitting: Emergency Medicine

## 2023-11-17 ENCOUNTER — Emergency Department

## 2023-11-17 DIAGNOSIS — R1084 Generalized abdominal pain: Secondary | ICD-10-CM | POA: Diagnosis not present

## 2023-11-17 DIAGNOSIS — R112 Nausea with vomiting, unspecified: Secondary | ICD-10-CM | POA: Diagnosis present

## 2023-11-17 DIAGNOSIS — R197 Diarrhea, unspecified: Secondary | ICD-10-CM | POA: Insufficient documentation

## 2023-11-17 LAB — CBC
HCT: 32.6 % — ABNORMAL LOW (ref 36.0–46.0)
Hemoglobin: 10.8 g/dL — ABNORMAL LOW (ref 12.0–15.0)
MCH: 31 pg (ref 26.0–34.0)
MCHC: 33.1 g/dL (ref 30.0–36.0)
MCV: 93.7 fL (ref 80.0–100.0)
Platelets: 352 K/uL (ref 150–400)
RBC: 3.48 MIL/uL — ABNORMAL LOW (ref 3.87–5.11)
RDW: 13.4 % (ref 11.5–15.5)
WBC: 6.9 K/uL (ref 4.0–10.5)
nRBC: 0 % (ref 0.0–0.2)

## 2023-11-17 LAB — URINALYSIS, ROUTINE W REFLEX MICROSCOPIC
Bilirubin Urine: NEGATIVE
Glucose, UA: NEGATIVE mg/dL
Hgb urine dipstick: NEGATIVE
Ketones, ur: NEGATIVE mg/dL
Leukocytes,Ua: NEGATIVE
Nitrite: NEGATIVE
Protein, ur: NEGATIVE mg/dL
Specific Gravity, Urine: 1.023 (ref 1.005–1.030)
pH: 5 (ref 5.0–8.0)

## 2023-11-17 LAB — COMPREHENSIVE METABOLIC PANEL WITH GFR
ALT: 25 U/L (ref 0–44)
AST: 32 U/L (ref 15–41)
Albumin: 4 g/dL (ref 3.5–5.0)
Alkaline Phosphatase: 91 U/L (ref 38–126)
Anion gap: 7 (ref 5–15)
BUN: 11 mg/dL (ref 6–20)
CO2: 22 mmol/L (ref 22–32)
Calcium: 8.5 mg/dL — ABNORMAL LOW (ref 8.9–10.3)
Chloride: 111 mmol/L (ref 98–111)
Creatinine, Ser: 0.46 mg/dL (ref 0.44–1.00)
GFR, Estimated: >60 mL/min (ref >60)
Glucose, Bld: 91 mg/dL (ref 70–99)
Potassium: 4 mmol/L (ref 3.5–5.1)
Sodium: 140 mmol/L (ref 135–145)
Total Bilirubin: 0.6 mg/dL (ref 0.0–1.2)
Total Protein: 7.1 g/dL (ref 6.5–8.1)

## 2023-11-17 LAB — LIPASE, BLOOD: Lipase: 27 U/L (ref 11–51)

## 2023-11-17 LAB — POC URINE PREG, ED: Preg Test, Ur: NEGATIVE

## 2023-11-17 MED ORDER — PROMETHAZINE HCL 25 MG RE SUPP: 25.0000 mg | Freq: Four times a day (QID) | RECTAL | 0 refills | Status: AC | PRN

## 2023-11-17 MED ORDER — SODIUM CHLORIDE 0.9 % IV BOLUS
1000.0000 mL | Freq: Once | INTRAVENOUS | Status: AC
  Administered 2023-11-17: 1000 mL via INTRAVENOUS

## 2023-11-17 MED ORDER — SODIUM CHLORIDE 0.9 % IV SOLN
8.0000 mg | Freq: Once | INTRAVENOUS | Status: DC
  Filled 2023-11-17: qty 4

## 2023-11-17 MED ORDER — OXYCODONE-ACETAMINOPHEN 5-325 MG PO TABS
1.0000 | ORAL_TABLET | Freq: Once | ORAL | Status: AC
  Administered 2023-11-17: 1 via ORAL
  Filled 2023-11-17: qty 1

## 2023-11-17 MED ORDER — ONDANSETRON HCL 4 MG/2ML IJ SOLN
4.0000 mg | Freq: Once | INTRAMUSCULAR | Status: AC
  Administered 2023-11-17: 4 mg via INTRAVENOUS
  Filled 2023-11-17: qty 2

## 2023-11-17 NOTE — ED Notes (Signed)
 Patient able to tolerate saltine crackers and water  at this time. Patient given grape juice at this time, per request.

## 2023-11-17 NOTE — ED Triage Notes (Signed)
 Pt comes in from home via pov with complaints of abdominal pain that began Sunday as nausea and progressed to diarrhea. Pt had an episode of vomiting this morning. Pt complains of pain 8/10. Pt had weight loss surgery 9 years ago.

## 2023-11-17 NOTE — ED Notes (Signed)
 Pt c/o recurrent abdominal pain and n/v/d.  Emesis x1 episode this morning.  Pt reports recurrent bowel obstructions and sts she was told to come to the ED when anytime she vomits.

## 2023-11-17 NOTE — ED Notes (Signed)
Patient given saltine crackers and water for PO challenge 

## 2023-11-17 NOTE — ED Provider Notes (Signed)
 [facility] Provider Note   Event Date/Time   First MD Initiated Contact with Patient 11/17/23 1658     (approximate) History  Abdominal Pain  HPI Maria Fuller is a 38 y.o. female with past medical history of obesity status post bariatric surgery with chronic abdominal pain and nausea/vomiting who presents complaining of abdominal pain and nausea/vomiting/diarrhea that began overnight.  Patient states that her last bowel movement was this morning and she has been p.o. intolerant throughout the day despite using her home Zofran . ROS: Patient currently denies any vision changes, tinnitus, difficulty speaking, facial droop, sore throat, chest pain, shortness of breath, dysuria, or weakness/numbness/paresthesias in any extremity   Physical Exam  Triage Vital Signs: ED Triage Vitals  Encounter Vitals Group     BP 11/17/23 1524 (!) 158/84     Girls Systolic BP Percentile --      Girls Diastolic BP Percentile --      Boys Systolic BP Percentile --      Boys Diastolic BP Percentile --      Pulse Rate 11/17/23 1524 (!) 59     Resp 11/17/23 1524 18     Temp 11/17/23 1524 98.1 F (36.7 C)     Temp src --      SpO2 11/17/23 1524 99 %     Weight 11/17/23 1525 259 lb 14.8 oz (117.9 kg)     Height 11/17/23 1525 6' 1 (1.854 m)     Head Circumference --      Peak Flow --      Pain Score 11/17/23 1525 8     Pain Loc --      Pain Education --      Exclude from Growth Chart --    Most recent vital signs: Vitals:   11/17/23 1730 11/17/23 1800  BP: 130/85 (!) 125/58  Pulse: (!) 55 (!) 58  Resp: 18 18  Temp:    SpO2: 100% 100%   General: Awake, oriented x4. CV:  Good peripheral perfusion. Resp:  Normal effort. Abd:  No distention. Other:  Middle-aged obese Caucasian female resting comfortably in no acute distress ED Results / Procedures / Treatments  Labs (all labs ordered are listed, but only abnormal results are displayed) Labs Reviewed  COMPREHENSIVE  METABOLIC PANEL WITH GFR - Abnormal; Notable for the following components:      Result Value   Calcium 8.5 (*)    All other components within normal limits  CBC - Abnormal; Notable for the following components:   RBC 3.48 (*)    Hemoglobin 10.8 (*)    HCT 32.6 (*)    All other components within normal limits  URINALYSIS, ROUTINE W REFLEX MICROSCOPIC - Abnormal; Notable for the following components:   Color, Urine YELLOW (*)    APPearance CLEAR (*)    All other components within normal limits  LIPASE, BLOOD  POC URINE PREG, ED  RADIOLOGY ED MD interpretation: Single view x-ray of the abdomen does not show any evidence of acute abnormalities and specifically no signs of obstruction - All radiology independently interpreted and agree with radiology assessment Official radiology report(s): DG Abdomen 1 View Result Date: 11/17/2023 CLINICAL DATA:  Abdominal pain for 2 days EXAM: ABDOMEN - 1 VIEW COMPARISON:  11/06/2023 FINDINGS: Scattered large and small bowel gas is noted. No obstructive changes are seen. No free air is noted. IUD is noted in place. No acute bony abnormality is seen. IMPRESSION: No acute abnormality noted. Electronically Signed  By: Oneil Devonshire M.D.   On: 11/17/2023 19:09   PROCEDURES: Critical Care performed: No Procedures MEDICATIONS ORDERED IN ED: Medications  oxyCODONE -acetaminophen  (PERCOCET/ROXICET) 5-325 MG per tablet 1 tablet (has no administration in time range)  ondansetron  (ZOFRAN ) injection 4 mg (4 mg Intravenous Given 11/17/23 1959)  sodium chloride  0.9 % bolus 1,000 mL (1,000 mLs Intravenous New Bag/Given 11/17/23 2000)   IMPRESSION / MDM / ASSESSMENT AND PLAN / ED COURSE  I reviewed the triage vital signs and the nursing notes.                             The patient is on the cardiac monitor to evaluate for evidence of arrhythmia and/or significant heart rate changes. Patient's presentation is most consistent with acute presentation with potential  threat to life or bodily function. Patient presents for acute on chronic nausea/vomiting The cause of the patient's symptoms is not clear, but the patient is overall well appearing and is suspected to have a transient course of illness.  Given History and Exam there does not appear to be an emergent cause of the symptoms such as small bowel obstruction, coronary syndrome, bowel ischemia, DKA, pancreatitis, appendicitis, other acute abdomen or other emergent problem.  Reassessment: After treatment, the patient is feeling much better, tolerating PO fluids, and shows no signs of dehydration.   Disposition: Discharge home with prompt primary care physician follow up in the next 48 hours. Strict return precautions discussed.   FINAL CLINICAL IMPRESSION(S) / ED DIAGNOSES   Final diagnoses:  Nausea and vomiting, unspecified vomiting type  Generalized abdominal pain  Diarrhea, unspecified type   Rx / DC Orders   ED Discharge Orders     None      Note:  This document was prepared using Dragon voice recognition software and may include unintentional dictation errors.   Gita Dilger K, MD 11/17/23 [facility]

## 2023-12-08 NOTE — Progress Notes (Signed)
 [facility] Department of Anesthesiology  Return Patient Visit    Medication Monitoring   PARAMETER DATE COMMENT  Pain Contract Signed    Last Attending Visit    Med/Psych Eval    Last UDS    [place] PMP Aware Website Accessed [employer]  12/11/2023 See below  [place] Offender Database Accessed [place] Offender Database Website         Current MME    Narcan  Rx    H/O Aberrant Behaviors     High Risk for Opioids (ex: OSA, benzo use, etc.)    No shows/Cancellations       CHIEF COMPLAINT:  Abdominal Pain (Lower right)      Order Questions     Question Answer    Select reason for referral (check all that apply): Other    Please specify reason for referral: Chronic Abdominal pain    Select service: Medication Management      Opioid Management    Is the patient currently on opioids: No    Has the patient previously received injection therapy for this? No    Chronic severe right abdominal pain. 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 Also s/p adhesiolysis and closure of internal hernia with [facility]Dr. [place] in 2023. 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 S/p diagnostic laparoscopy on 08/28/23 with no findings necessitating surgical intervention.   Initial Pain History (HPI): Maria Fuller is a 38 y.o. female seen in consultation at the request of Dr. Isaiah in the pain clinic with PMH significant for  Past Medical History:  Diagnosis Date  . Anxiety 2005   Psychiatry  . Bradycardia 04/19/2022  . Depression 2005   psychiatry, bipolar disorder  . Diabetes mellitus type 2, uncomplicated (CMS/HHS-HCC) 06/2011   seen in ER  . Diverticulitis 2010  . Diverticulosis 07/14/2014  . Gastritis 07/14/2014  . Headache 07/10/2013  . History of diabetes mellitus, type II 03/04/2013  . History of PCOS 08/30/2015  . Hypertension 07/2011  . Hypothyroidism 2011  . Left ovarian cyst 11/28/2021  . Left ureteral  stone 04/18/2022  . Major depressive disorder, single episode 07/22/2013   Overview:  Overview:  psychiatry  Last Assessment & Plan:  Pt with increasing depression since recent illness (intractable headaches secondary to likely viral meningitis). Pt with history of depressive episodes about once a year and feels that she is starting to have one. Denies any suicidal/homicidal ideation or plan. PHQ-9 total score of 10. -Will continue current meds (max of zoloft ) and also o  . Migraine 04/07/2014   Last Assessment & Plan:  Improved since the initiation of propranolol. Continue current meds.    . Migraine headache   . Obesity 07/2011  . OSA (obstructive sleep apnea) 09/2012  . Partial small bowel obstruction (CMS/HHS-HCC) 08/04/2019  . PCOS (polycystic ovarian syndrome) 2007  . Tubular adenoma of colon 07/14/2014    and who presents for evaluation of a chief complaint of abdominal pain.  The patient is interviewed and examined, chart is reviewed, and the plan is formulated by me personally.    History of Present Illness  12/11/23:  Maria Fuller is a 38 year old female who presents with chronic right lower abdominal pain.  She experiences chronic pain in the right lower abdomen, where a hernia mesh was previously placed. The pain averages six out of ten, though it has reached seven or eight. Currently, she rates her pain at four out of ten.  The pain has been persistent, leading to several emergency room visits due to its severity.  She takes gabapentin , increased to 600 mg three times a day, which has been helpful in managing her pain. No side effects from gabapentin . Additionally, she uses methocarbamol, which she also finds beneficial.  For anxiety, she has been prescribed Klonopin by her primary care physician, taking one daily, and occasionally two if her anxiety worsens. She notes that the combination of Klonopin and gabapentin  has led to a significant improvement in her symptoms.  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 She is apprehensive about how this increased activity might affect her pain levels.  Current Treatment: Pain Medications       Disp Refills Start End   clonazePAM (KLONOPIN) 0.5 MG tablet 30 tablet 2 11/04/2023 02/02/2024   Sig - Route: Take 1 tablet (0.5 mg total) by mouth once daily as needed for Anxiety for up to 90 days - Oral   Class: Electronic   methocarbamoL (ROBAXIN) 500 MG tablet 90 tablet 5 12/11/2023 06/08/2024   Sig - Route: Take 1 tablet (500 mg total) by mouth 3 (three) times daily for 180 days - Oral   Class: Electronic   gabapentin  (NEURONTIN ) 600 MG tablet 90 tablet 5 12/11/2023 12/10/2024   Sig - Route: Take 1 tablet (600 mg total) by mouth 3 (three) times daily - Oral   Class: Electronic      Patient reports continued benefit from these medications including improved quality of life with the ability to remain active and complete various ADL's. Reports no side effects such as nausea, vomiting, constipation, mental status changes, respiratory depression, or itching on current pain medication regimen. Reports to be taking it as prescribed. There is no evidence of aberrancy from the clinic presentation or [place]  Controlled Substances Reporting system.     PAST PAIN TREATMENTS    ONGOING LIST OF INJECTIONS / INTERVENTIONS DONE (Date / Procedure / Outcome) **     Objective Pain Scoring  Brief Pain Inventory Total score:   Pain Interference score:   PHQ-9:   Opioid Risk Tool:      Summary of Past 3 Chronic Opioid Therapy Documentation    10/26/2023    9:25 AM 12/09/2023    2:26 PM  OPIOID CHRONIC THERAPY  Pain on average 7 6  Pain interferes with enjoyment 7 6  Pain interferes with activity 7 6  PEG Average Score 7 * 6 *  Do you have a family history of alcohol abuse? (F) 0   Do you have a family history of Illegal Drug abuse? (F) 0   Do you  have a family history of Prescription Drug abuse? (F) 0   Do you have a personal history of alcohol abuse? (F) 0   Do you have a personal history of illegal drug abuse? (F) 0   Do you have a personal history of prescription drug abuse? (F) 0   Please indicate if you are between 82 and 55 years old. (F) 1   Do you have a history of sexual abuse that occurred before your teen years? (F) 0   Do you have a personal history of Attention-deficit/hyperactivity disorder, obsessive compulsive disorder, bipolar disorder or schizophrenia? (F) 0   Do you have a personal history of Depression? (F) 1   Total Opioid Risk Score (Interpretation: 0-3 = LOW RISK ; 4-7 MODERATE RISK ; 8  OR > = HIGH RISK)  (F) 2 *   How and where do you store your pain medications?  in my bathroom  What activities do you think are possible while taking opioid medications that you could not do without the medications?  cooking,take care of house,able to work,exercise,go for walks,take care of children/grandchildren    * Patient-reported     PHQ 2/9 last 3 flowsheet values    12/22/2022    1:54 PM 10/26/2023    9:26 AM 12/11/2023    8:45 AM  PHQ-2/9 Depression Screening   Little interest or pleasure in doing things 1 2 3   Feeling down, depressed, or hopeless 1 1 1   Patient Health Questionnaire-2 Score 2 * 3 * 4  Trouble falling or staying asleep, or sleeping too much 1  0  Feeling tired or having little energy 2  1  Poor appetite or overeating 1  0  Feeling bad about yourself - or that you are a failure or have let yourself or your family down 1  0  Trouble concentrating on things, such as reading the newspaper or watching television 1  0  Moving or speaking so slowly that other people could have noticed? Or the opposite - being so fidgety or restless that you have been moving around a lot more than usual.   0  Thoughts that you would be better off dead or hurting yourself in some way 0  0  Patient Health Questionnaire-9 Score    5    * Patient-reported  * Data saved with a previous flowsheet row definition      REVIEW OF SYSTEMS 12/11/2023   A Review of Systems was performed and the patient's pertinent positives and negatives are documented in HPI, all others systems are negative.  Social History   Socioeconomic History  . Marital status: Married    Spouse name: Married  . Number of children: 2  . Years of education: 99  . Highest education level: Some college, no degree  Occupational History    Comment: looking for work 07/2011  Tobacco Use  . Smoking status: Never  . Smokeless tobacco: Never  Vaping Use  . Vaping status: Never Used  Substance and Sexual Activity  . Alcohol use: No  . Drug use: No  . Sexual activity: Yes    Partners: Male    Birth control/protection: I.U.D., None    Comment: Mirena   Other Topics Concern  . Would you please tell us  about the people who live in your home, your pets, or anything else important to your social life? No  Social History Narrative   Patient is married and has one 3-year old son.   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    Social Drivers of Corporate investment banker Strain: Low Risk  (08/26/2023)   Overall Financial Resource Strain (CARDIA)   . Difficulty of Paying Living Expenses: Not hard at all  Food Insecurity: No Food Insecurity (08/26/2023)   Hunger Vital Sign   . Worried About Programme researcher, broadcasting/film/video in the Last Year: Never true   . Ran Out of Food in the Last Year: Never true  Transportation Needs: No Transportation Needs (08/26/2023)   PRAPARE - Transportation   . Lack of Transportation (Medical): No   . Lack of Transportation (Non-Medical): No  Social Connections: Moderately Integrated (  08/03/2023)   Received from [facility]   Social Connection and Isolation Panel   . In a typical week, how many times do you talk on the  phone with family, friends, or neighbors?: More than three times a week   . How often do you get together with friends or relatives?: Twice a week   . How often do you attend church or religious services?: 1 to 4 times per year   . Do you belong to any clubs or organizations such as church groups, unions, fraternal or athletic groups, or school groups?: No   . How often do you attend meetings of the clubs or organizations you belong to?: Never   . Are you married, widowed, divorced, separated, never married, or living with a partner?: Married  Housing Stability: Low Risk  (08/21/2023)   Housing Stability Vital Sign   . Unable to Pay for Housing in the Last Year: No   . Number of Times Moved in the Last Year: 0   . Homeless in the Last Year: No    Family History  Problem Relation Age of Onset  . Polycystic ovary syndrome Mother   . Depression Mother   . Skin cancer Mother   . Diabetes type II Father   . High blood pressure (Hypertension) Father   . Irritable bowel syndrome Father   . Colon polyps Father   . Depression Father   . Coronary Artery Disease (Blocked arteries around heart) Father   . Polycystic ovary syndrome Sister   . Breast cancer Maternal Grandmother 52  . Diabetes type II Paternal Grandmother   . Breast cancer Paternal Grandmother 105  . Depression Paternal Grandmother   . Diabetes type II Paternal Grandfather   . High blood pressure (Hypertension) Paternal Grandfather   . Depression Paternal Grandfather   . Coronary Artery Disease (Blocked arteries around heart) Paternal Grandfather   . Colon cancer Neg Hx     Past Medical History:  Diagnosis Date  . Anxiety 2005   Psychiatry  . Bradycardia 04/19/2022  . Depression 2005   psychiatry, bipolar disorder  . Diabetes mellitus type 2, uncomplicated (CMS/HHS-HCC) 06/2011   seen in ER  . Diverticulitis 2010  . Diverticulosis 07/14/2014  . Gastritis 07/14/2014  . Headache 07/10/2013  . History of diabetes  mellitus, type II 03/04/2013  . History of PCOS 08/30/2015  . Hypertension 07/2011  . Hypothyroidism 2011  . Left ovarian cyst 11/28/2021  . Left ureteral stone 04/18/2022  . Major depressive disorder, single episode 07/22/2013   Overview:  Overview:  psychiatry  Last Assessment & Plan:  Pt with increasing depression since recent illness (intractable headaches secondary to likely viral meningitis). Pt with history of depressive episodes about once a year and feels that she is starting to have one. Denies any suicidal/homicidal ideation or plan. PHQ-9 total score of 10. -Will continue current meds (max of zoloft ) and also o  . Migraine 04/07/2014   Last Assessment & Plan:  Improved since the initiation of propranolol. Continue current meds.    . Migraine headache   . Obesity 07/2011  . OSA (obstructive sleep apnea) 09/2012  . Partial small bowel obstruction (CMS/HHS-HCC) 08/04/2019  . PCOS (polycystic ovarian syndrome) 2007  . Tubular adenoma of colon 07/14/2014    Patient Active Problem List  Diagnosis  . Hyperlipidemia  . Anxiety state  . Morbid obesity (CMS-HCC)  . History of hypertension  . History of PCOS  . Status post bariatric surgery  .  Partial small bowel obstruction (CMS/HHS-HCC)  . S/P biliopancreatic diversion with duodenal switch  . Internal hernia  . Situational mixed anxiety and depressive disorder  . Major depressive disorder, recurrent, moderate (CMS/HHS-HCC)  . Left ovarian cyst  . Dysmenorrhea  . Menometrorrhagia  . PCOS (polycystic ovarian syndrome)  . Lower abdominal pain  . SBO (small bowel obstruction) (CMS/HHS-HCC)  . Abdominal pain  . Gestational diabetes mellitus (HHS-HCC)  . Food bolus obstruction of intestine  (CMS/HHS-HCC)  . Bradycardia  . Epigastric pain  . Febrile urinary tract infection  . Left renal stone  . Left ureteral stone    Current Outpatient Medications on File Prior to Visit  Medication Sig Dispense Refill  . buPROPion   (WELLBUTRIN  XL) 150 MG XL tablet TAKE ONE TABLET BY MOUTH ONE TIME DAILY 90 tablet 3  . clonazePAM (KLONOPIN) 0.5 MG tablet Take 1 tablet (0.5 mg total) by mouth once daily as needed for Anxiety for up to 90 days 30 tablet 2  . hydrOXYzine (ATARAX) 25 MG tablet TAKE ONE TABLET BY MOUTH THREE TIMES A DAY AS NEEDED FOR ANXIETY 30 tablet 8  . levonorgestreL  (MIRENA  52 MG) 20 mcg/24 hr (5 years) IUD Insert into the uterus    . ondansetron  (ZOFRAN -ODT) 4 MG disintegrating tablet PLACE ONE TABLET ON THE TONGUE AND ALLOW TO DISSOLVE EVERY 8 HOURS AS NEEDED FOR NAUSEA 30 tablet 3  . pantoprazole  (PROTONIX ) 40 MG DR tablet TAKE ONE TABLET BY MOUTH ONE TIME DAILY 90 tablet 3  . polyethylene glycol (MIRALAX ) packet Take 1 packet (17 g total) by mouth 2 (two) times daily Mix in 4-8ounces of fluid prior to taking.    . promethazine  (PHENERGAN ) 12.5 MG tablet TAKE ONE TABLET BY MOUTH EVERY 6 HOURS AS NEEDED FOR NAUSEA FOR UP TO 15 DAYS 30 tablet 8  . scopolamine  (TRANSDERM-SCOP) 1 mg over 3 days patch Place 1 patch (1 mg total) onto the skin every third day 10 patch 0  . sertraline  (ZOLOFT ) 100 MG tablet TAKE 2 TABLETS(200 MG) BY MOUTH EVERY DAY 200 tablet 3  . traZODone  (DESYREL ) 50 MG tablet TAKE ONE TABLET BY MOUTH AT BEDTIME 100 tablet 2  . nitrofurantoin , macrocrystal-monohydrate, (MACROBID ) 100 MG capsule Take 100 mg by mouth 2 (two) times daily (Patient not taking: Reported on 09/14/2023)    . sennosides (SENOKOT) 8.6 mg tablet Take 1 tablet by mouth once daily     No current facility-administered medications on file prior to visit.    Allergies  Allergen Reactions  . Ceftin [Cefuroxime Axetil] Rash  . Penicillins Rash  . Compazine  [Prochlorperazine ] Anxiety    OBJECTIVE   PHYSICAL EXAM  Vitals:   12/11/23 0844  BP: 120/70  Pulse: 68  Resp: 18  Temp: 36.7 C (98 F)  TempSrc: Oral  SpO2: 99%  Weight: (!) 119.8 kg (264 lb 3.2 oz)  Height: 185.4 cm (6' 1)  PainSc:   6  PainLoc: Abdomen     Constitutional:  Pleasant cooperative patient who appears stated age.  There are no overt pain behaviors.  Well groomed and dressed.   Mental Status:   Alert and oriented to person, place, time, and situation with congruent mood and normal affect.  Thought processes are rational, linear, and goal directed. HEENT:  Normocephalic, atraumatic scalp. Extraocular movements appear full and intact.  Neck:  Range of motion is appropriate.     Cardiovascular:  Appears well perfused.  Pulmonary:   Respiratory effort is adequate.  Gastrointestinal:  Non-distended.  Abdominal: tender to deep pressure in the right lower and upper quadrant; increased pain in the RLQ with abdominal contraction Musculoskeletal:      Neurlogical: CN II-XII grossly intact. Speech is fluent. Recent and remote memory are intact.  Motor: Motor 5/5 motor power of bilateral upper and lower extremities. Muscle tone and bulk normalin upper and lower extremities bilaterally. Gait:  Normal  Sensory:  no sensory deficits noted  Integumentary:  Normal coloration and turgor, no rashes, no suspicious skin lesions noted. Abdominal surgical scars well healed.   IMAGING/STUDIES   Narrative & Impression  EXAMINATION: CT abdomen pelvis with contrast   DATE: 08/21/2023   INDICATION: Right lower quadrant pain. History of duodenal switch.   COMPARISON: 08/02/2023, 06/23/2023   TECHNIQUE:  Contiguous axial images were obtained of the abdomen and pelvis with 100 mL Isovue -300 intravenous contrast. With dilute Gastrografin  enteric contrast. Dose reduction was obtained with Automatic Exposure Control (AEC) or, if AEC could not be utilized, by manual adjustment of the mA and/or kV according to patient size.   FINDINGS:   Normal heart size. Some atelectasis of the lung bases with no pleural fluid or consolidation.   Postoperative changes from previous bariatric surgery. There is no evidence of obstruction or inflammatory change. There  is mild bowel distention at the surgical anastomosis in the lower abdomen/pelvis.   No acute abnormality of the liver. Cholecystectomy without biliary dilatation. No pancreatitis. Spleen, adrenal glands, and kidneys show no acute changes. The ureters are nondilated. Urinary bladder is underdistended.   Retroverted uterus. IUD in place. No significant free fluid. There is altered bowel anatomy due to previous operation with some bowel and colonic loops in the subdiaphragmatic region and the right in the cecum in the right upper quadrant. The appendix is not inflamed. The configuration of the bowel loops is overall similar compared to the prior exam.   Normal caliber aorta. No enlarging adenopathy. No acute or destructive appearing osseous lesions. L5 pars defects with grade 1 anterolisthesis.   IMPRESSION:    -Postoperative changes from previous bariatric surgery. Currently there are no specific findings to suggest high-grade bowel obstruction.   -Likely on a postoperative basis, there is altered gastrointestinal anatomy with the cecum located in the right upper quadrant. This is similar to the previous exam.   Electronically Signed by:  Sybil Fairly, MD, [facility] Electronically Signed on:  08/21/2023 9:05 AM      ASSESSMENT & PLAN  Maria Fuller is a 38 y.o. female who presents to the pain clinic for evaluation and treatment of Abdominal Pain (Lower right) which has not been responsive to conservative measures. We discussed a comprehensive, multidisciplinary approach to her pain management plan today.  The patient was educated on self management strategies and a comprehensive approach.  IMPRESSION: 1. Depression screening (Z13.31)   2. Chronic right lower quadrant pain   3. Right lower quadrant abdominal pain       PLAN: Based on the patient history and exam findings, I recommend the following:  1.  Maria Fuller was counseled regarding the diagnosis and treatment  plan. My overall impression is that is Maria Fuller may benefit from comprehensive chronic pain management.    2.  The [place]  Controlled Substance Registry was reviewed.  The [place]  Department of Corrections Offender Search Portal was reviewed.  3.  Encouraged the patient to maintain activity level as tolerated and avoid bedrest.  We also discussed principles of self-management and lifestyle changes to optimize health (  mind, body and spirit) inclusive of physical therapy modalities, cognitive and behavioral therapies, coping strategies, and biofeedback.  Physical Therapy with home health exercises encouraged.    Assessment & Plan  Chronic right lower abdominal pain Pain improved with increased gabapentin , manageable at current level.  - Continue gabapentin  600 mg TID, five refills. - Refill methocarbamol. - Consider transverse abdominal plane vs rectus sheath block block if pain increases with activity. - Coordinate Klonopin prescription with primary care. - Tentative six-month follow-up; contact if pain increases to schedule for block or medication adjustments as needed.    Attestation Statement:   I personally performed the service, non-incident to. (WP)   MICHAEL TODD MESKE, NP  Requested Prescriptions   Signed Prescriptions Disp Refills  . methocarbamoL (ROBAXIN) 500 MG tablet 90 tablet 5    Sig: Take 1 tablet (500 mg total) by mouth 3 (three) times daily for 180 days  . gabapentin  (NEURONTIN ) 600 MG tablet 90 tablet 5    Sig: Take 1 tablet (600 mg total) by mouth 3 (three) times daily    Orders Placed This Encounter  Procedures  . Depression Screen -(PHQ- 2/9, BDI)     Return in about 6 months (around 06/12/2024).   Please note that if you have referred this patient for opioid management, that we will not prescribe controlled substances on the 1st visit.  If the patient continues to be considered low risk for opioid misuse at the completion of the opioid  assessment, she would continue to receive controlled substances from the PCP.  As such, any controlled substances must come from the referring provider if indicated.     Ozell Krystal Cornell, NP MICHAEL KRYSTAL CORNELL, NP  This document was prepared with a combination of digital dictation and smart phrase technology. Any transcriptional errors that result from this process are unintentional.  Future Appointments     Date/Time Provider Department Center Visit Type   12/22/2023 10:00 AM Jane Delmar Pike, NP [facility] C FOLLOW UP   01/06/2024 9:00 AM (Arrive by 8:45 AM) Kay Delon Roses, PA [facility] VIDEO VISIT RETURN   02/04/2024 4:00 PM (Arrive by 3:45 PM) Perri Constance Sor, PA [facility] PHYSICAL       Answers submitted by the patient for this visit: Recent Medical Symptoms (Submitted on 12/09/2023) Night sweats: Yes Appetite Loss: No Weight loss: No Fever: No Daytime sleepiness: No visual change: No Hearing loss: No Sore throat: No Hoarseness: No Cough: No Hemoptysis (Coughing up blood): No Shortness of breath: No Wheezing: No Chest pain: No Tachycardia (heart racing): No leg pain: No Nausea: Yes Vomiting: Yes Diarrhea: Yes Constipation: Yes Abdominal pain: Yes Bowel habits change: Yes Melena (Black tar-like stool): No Dysuria (Pain with urination): No Frequency (The need to urinate many times a day): No Hesitancy (Difficulty in beginning the flow of urine): No Joint pain: Yes Joint swelling: No Myalgias (Muscle pain): No Rash: No Pigment changes/discoloration: No Memory loss: Yes Syncope (Fainting or passing out): No Extremity weakness: No Paresthesias (Pins and Needles): No Loss of balance: Yes  *Some images could not be shown.

## 2023-12-22 NOTE — Progress Notes (Signed)
 Telemedicine video encounter documentation  This video encounter was conducted with the patient's (or proxy's) verbal consent via secure, interactive audio and video telecommunications while in clinic/office/hospital.  The patient (or proxy) was instructed to have this encounter in a suitably private space and to only have persons present to whom they give permission to participate. In addition, patient identity was confirmed by use of name plus two identifiers.   Chief Complaint:   Chief Complaint  Patient presents with  . Pain  . Anxiety    Subjective:   Maria Fuller is a 38 y.o. female established patient. Video visit today for:  History of Present Illness Maria Fuller is a 38 year old female who presents with a request for clonazepam prescription for pain management.  She has been experiencing severe intermittent right-sided abdominal pain for over two years now, leading to multiple emergency room visits, work ups, and specialist visits. The pain is intense and causes vomiting and anxiety.  She is now seeing pain mgmt clinic who is prescribing gabapentin  and methocarbamol.  She has been using clonazepam in combination with gabapentin  and methocarbamol, which significantly improves her pain. Initially, clonazepam was taken during panic attacks, but she noticed pain relief when taken daily. The pain can escalate to a level of 7 or 8, at which point she takes two clonazepam tablets, though this causes some drowsiness.  Her current medication regimen includes gabapentin , which was recently increased, and methocarbamol. She is taking clonazepam daily for pain relief, not just for anxiety, and requests an increase in her prescription to accommodate days when she needs to take two tablets. She has not needed opioid pain meds recently, which is an improvement.  She mentions feeling well enough to engage in activities such as hiking, although this can exacerbate her pain. She is concerned  about managing her pain as she returns to work, which involves standing and physical activity.     Patient Active Problem List  Diagnosis  . Hyperlipidemia  . Anxiety state  . Morbid obesity (CMS-HCC)  . History of hypertension  . History of PCOS  . Status post bariatric surgery  . Partial small bowel obstruction (CMS/HHS-HCC)  . S/P biliopancreatic diversion with duodenal switch  . Internal hernia  . Situational mixed anxiety and depressive disorder  . Major depressive disorder, recurrent, moderate (CMS/HHS-HCC)  . Left ovarian cyst  . Dysmenorrhea  . Menometrorrhagia  . PCOS (polycystic ovarian syndrome)  . Lower abdominal pain  . SBO (small bowel obstruction) (CMS/HHS-HCC)  . Abdominal pain  . Gestational diabetes mellitus (HHS-HCC)  . Food bolus obstruction of intestine  (CMS/HHS-HCC)  . Bradycardia  . Epigastric pain  . Febrile urinary tract infection  . Left renal stone  . Left ureteral stone    Outpatient Medications Prior to Visit  Medication Sig Dispense Refill  . buPROPion  (WELLBUTRIN  XL) 150 MG XL tablet TAKE ONE TABLET BY MOUTH ONE TIME DAILY 90 tablet 3  . gabapentin  (NEURONTIN ) 600 MG tablet Take 1 tablet (600 mg total) by mouth 3 (three) times daily 90 tablet 5  . hydrOXYzine (ATARAX) 25 MG tablet TAKE ONE TABLET BY MOUTH THREE TIMES A DAY AS NEEDED FOR ANXIETY 30 tablet 8  . levonorgestreL  (MIRENA  52 MG) 20 mcg/24 hr (5 years) IUD Insert into the uterus    . methocarbamoL (ROBAXIN) 500 MG tablet Take 1 tablet (500 mg total) by mouth 3 (three) times daily for 180 days 90 tablet 5  . nitrofurantoin , macrocrystal-monohydrate, (MACROBID ) 100 MG capsule  Take 100 mg by mouth 2 (two) times daily (Patient not taking: Reported on 09/14/2023)    . ondansetron  (ZOFRAN -ODT) 4 MG disintegrating tablet PLACE ONE TABLET ON THE TONGUE AND ALLOW TO DISSOLVE EVERY 8 HOURS AS NEEDED FOR NAUSEA 30 tablet 3  . pantoprazole  (PROTONIX ) 40 MG DR tablet TAKE ONE TABLET BY MOUTH ONE  TIME DAILY 90 tablet 3  . polyethylene glycol (MIRALAX ) packet Take 1 packet (17 g total) by mouth 2 (two) times daily Mix in 4-8ounces of fluid prior to taking.    . promethazine  (PHENERGAN ) 12.5 MG tablet TAKE ONE TABLET BY MOUTH EVERY 6 HOURS AS NEEDED FOR NAUSEA FOR UP TO 15 DAYS 30 tablet 8  . scopolamine  (TRANSDERM-SCOP) 1 mg over 3 days patch Place 1 patch (1 mg total) onto the skin every third day 10 patch 0  . sennosides (SENOKOT) 8.6 mg tablet Take 1 tablet by mouth once daily    . sertraline  (ZOLOFT ) 100 MG tablet TAKE 2 TABLETS(200 MG) BY MOUTH EVERY DAY 200 tablet 3  . traZODone  (DESYREL ) 50 MG tablet TAKE ONE TABLET BY MOUTH AT BEDTIME 100 tablet 2  . clonazePAM (KLONOPIN) 0.5 MG tablet Take 1 tablet (0.5 mg total) by mouth once daily as needed for Anxiety for up to 90 days 30 tablet 2   No facility-administered medications prior to visit.     ROS   Objective:   Vitals as reported by patient: Vitals:   12/22/23 1027  Weight: (!) 119.7 kg (264 lb)  PainSc:   3  PainLoc: Abdomen   Body mass index is 34.83 kg/m.  Constitutional: alert, interactive with provider, cooperative, in no distress Mental status: oriented x 3, good historian, appropriate mood and behavior, thought content appears normal Respiratory: no respiratory distress, no audible wheezing   Assessment/Plan:   Assessment & Plan Chronic abdominal pain with recurrent bowel obstructions Chronic right-sided abdominal pain with recurrent bowel obstructions, exacerbated by physical activity. Clonazepam, gabapentin , and methocarbamol effectively manage pain. Clonazepam used to reduce opiate use risk, with occasional dosage increase during severe pain. Risk/benefit discussed. Clonazepam treatment plan agreed upon by pain management specialist when implemented by current provider. Pain clinic notes reviewed. - Prescribed clonazepam 1 mg, 45 tablets/month, one daily, up to two on severe pain days. - PDMP reviewed -  Continue gabapentin  and methocarbamol as prescribed. - Follow up with pain clinic in six months or sooner if pain worsens or nerve block needed.  Diagnoses and all orders for this visit:  Chronic right lower quadrant pain -     clonazePAM (KLONOPIN) 0.5 MG tablet; Take 1 tablet (0.5 mg total) by mouth 2 (two) times daily as needed (For chronic pain) for up to 180 days  Anxiety disorder with panic attacks -     clonazePAM (KLONOPIN) 0.5 MG tablet; Take 1 tablet (0.5 mg total) by mouth 2 (two) times daily as needed (For chronic pain) for up to 180 days    This visit was coded based on medical decision making (MDM).            Future Appointments     Date/Time Provider Department Center Visit Type   12/22/2023 4:30 PM (Arrive by 4:15 PM) Perri Constance Sor, PA [facility] VIDEO VISIT RETURN   01/06/2024 9:00 AM (Arrive by 8:45 AM) Kay Delon Roses, PA [facility] VIDEO VISIT RETURN   02/04/2024 4:00 PM (Arrive by 3:45 PM) Perri Constance Sor, PA [facility]       There are no Patient Instructions on file for this visit.   An after visit summary was provided for the patient either in written format (printed) or through MyChart.  This note has been created using automated tools and reviewed for accuracy by MYCHAL KELLY POWELL.

## 2024-01-02 ENCOUNTER — Emergency Department
Admission: EM | Admit: 2024-01-02 | Discharge: 2024-01-02 | Disposition: A | Discharge status: Home / Self Care | Attending: Emergency Medicine | Admitting: Emergency Medicine

## 2024-01-02 ENCOUNTER — Other Ambulatory Visit: Payer: Self-pay

## 2024-01-02 ENCOUNTER — Emergency Department

## 2024-01-02 DIAGNOSIS — R110 Nausea: Secondary | ICD-10-CM | POA: Insufficient documentation

## 2024-01-02 DIAGNOSIS — R1011 Right upper quadrant pain: Secondary | ICD-10-CM | POA: Diagnosis present

## 2024-01-02 DIAGNOSIS — R109 Unspecified abdominal pain: Secondary | ICD-10-CM

## 2024-01-02 LAB — COMPREHENSIVE METABOLIC PANEL WITH GFR
ALT: 20 U/L (ref 0–44)
AST: 22 U/L (ref 15–41)
Albumin: 4.4 g/dL (ref 3.5–5.0)
Alkaline Phosphatase: 87 U/L (ref 38–126)
Anion gap: 6 (ref 5–15)
BUN: 14 mg/dL (ref 6–20)
CO2: 22 mmol/L (ref 22–32)
Calcium: 8.1 mg/dL — ABNORMAL LOW (ref 8.9–10.3)
Chloride: 109 mmol/L (ref 98–111)
Creatinine, Ser: 0.34 mg/dL — ABNORMAL LOW (ref 0.44–1.00)
GFR, Estimated: >60 mL/min (ref >60)
Glucose, Bld: 77 mg/dL (ref 70–99)
Potassium: 3.8 mmol/L (ref 3.5–5.1)
Sodium: 137 mmol/L (ref 135–145)
Total Bilirubin: 0.7 mg/dL (ref 0.0–1.2)
Total Protein: 7.3 g/dL (ref 6.5–8.1)

## 2024-01-02 LAB — CBC
HCT: 33.1 % — ABNORMAL LOW (ref 36.0–46.0)
Hemoglobin: 10.9 g/dL — ABNORMAL LOW (ref 12.0–15.0)
MCH: 30.9 pg (ref 26.0–34.0)
MCHC: 32.9 g/dL (ref 30.0–36.0)
MCV: 93.8 fL (ref 80.0–100.0)
Platelets: 337 K/uL (ref 150–400)
RBC: 3.53 MIL/uL — ABNORMAL LOW (ref 3.87–5.11)
RDW: 13.9 % (ref 11.5–15.5)
WBC: 7.8 K/uL (ref 4.0–10.5)
nRBC: 0 % (ref 0.0–0.2)

## 2024-01-02 LAB — URINALYSIS, ROUTINE W REFLEX MICROSCOPIC
Bilirubin Urine: NEGATIVE
Glucose, UA: NEGATIVE mg/dL
Hgb urine dipstick: NEGATIVE
Ketones, ur: NEGATIVE mg/dL
Leukocytes,Ua: NEGATIVE
Nitrite: NEGATIVE
Protein, ur: NEGATIVE mg/dL
Specific Gravity, Urine: 1.016 (ref 1.005–1.030)
pH: 5 (ref 5.0–8.0)

## 2024-01-02 LAB — POC URINE PREG, ED: Preg Test, Ur: NEGATIVE

## 2024-01-02 LAB — LIPASE, BLOOD: Lipase: 27 U/L (ref 11–51)

## 2024-01-02 MED ORDER — MORPHINE SULFATE (PF) 4 MG/ML IV SOLN
4.0000 mg | Freq: Once | INTRAVENOUS | Status: AC
  Administered 2024-01-02: 4 mg via INTRAVENOUS
  Filled 2024-01-02: qty 1

## 2024-01-02 MED ORDER — IOHEXOL 300 MG/ML  SOLN
100.0000 mL | Freq: Once | INTRAMUSCULAR | Status: AC | PRN
  Administered 2024-01-02: 100 mL via INTRAVENOUS

## 2024-01-02 MED ORDER — OXYCODONE-ACETAMINOPHEN 5-325 MG PO TABS: 1.0000 | ORAL_TABLET | ORAL | 0 refills | Status: AC | PRN

## 2024-01-02 MED ORDER — ONDANSETRON HCL 4 MG/2ML IJ SOLN
4.0000 mg | INTRAMUSCULAR | Status: AC
  Administered 2024-01-02: 4 mg via INTRAVENOUS
  Filled 2024-01-02: qty 2

## 2024-01-02 NOTE — ED Triage Notes (Signed)
 Pt to ED for upper mid abdominal pain and R mid back pain since yesterday. Nauseous, no vomiting.   Hx several SBO (partial and full) after weight loss surgery. Has had several abdominal surgeries.   Skin is dry, respirations unlabored.

## 2024-01-02 NOTE — Discharge Instructions (Addendum)
 Your CT scan today is reassuring.  Return to the ER immediately for new, worsening, or persistent severe abdominal pain, vomiting, fever, weakness, or any other new or worsening symptoms that concern you.

## 2024-01-02 NOTE — ED Provider Notes (Signed)
 [facility] Provider Note    Event Date/Time   First MD Initiated Contact with Patient 01/02/24 1412     (approximate)   History   Abdominal Pain and Back Pain   HPI  Maria Fuller is a 38 y.o. female history of previous bowel obstruction, incarcerated hernia, bariatric surgery and a fairly lengthy history of complications of abdominal pain following her surgery as well.   Yesterday patient began experiencing pain and discomfort in her mid to right upper abdomen.  Along with this feeling of gurgling.  She had 1 loose stool yesterday which is not at all unusual, but today has not had a bowel movement and she has not passed any gas.  She reports she has been struggling with and starting to good control over right lower quadrant pain that was causing her frequent problems but today symptoms seem different than the chronic abdominal pain she is used to having.  No fever no chills no vomiting.  She has felt slightly nauseated.  Reports it is like a gurgling feeling and a sharp pain and a deep discomfort in her mid right abdomen.  No pain or burning with urination.  Reports she has had kidney stones before as well, but this does not feel quite the same  She has taken her home medication without improvement, concern because this pain seems different for unusual for compared to her typical abdominal pains that she has become accustomed to  No lower abdominal pain no pelvic symptoms.  Reports IUD, denies any concerns for pregnancy  Physical Exam   Triage Vital Signs: ED Triage Vitals  Encounter Vitals Group     BP 01/02/24 1404 (!) 133/102     Girls Systolic BP Percentile --      Girls Diastolic BP Percentile --      Boys Systolic BP Percentile --      Boys Diastolic BP Percentile --      Pulse Rate 01/02/24 1404 66     Resp 01/02/24 1404 18     Temp 01/02/24 1404 98.4 F (36.9 C)     Temp Source 01/02/24 1404 Oral     SpO2 01/02/24 1404 96 %     Weight  01/02/24 1406 260 lb (117.9 kg)     Height 01/02/24 1406 6' 1 (1.854 m)     Head Circumference --      Peak Flow --      Pain Score 01/02/24 1404 6     Pain Loc --      Pain Education --      Exclude from Growth Chart --     Most recent vital signs: Vitals:   01/02/24 1404  BP: (!) 133/102  Pulse: 66  Resp: 18  Temp: 98.4 F (36.9 C)  SpO2: 96%     General: Awake, no distress.  She is very pleasant Mucous membranes are moist CV:  Good peripheral perfusion.  Normal tones and rate Resp:  Normal effort.  Clear bilateral Abd:  No distention.  Obesity.  No focal tenderness in the left side of the abdomen or right lower quadrant.  She does report reliably pain in the right upper quadrant and seems to have some tenderness locally in the right upper quadrant to percussion.  Reports palpation in the mid left abdomen refers pain towards the right upper quadrant. Other:  Abdominal exam negative for obvious evidence of acute abdomen but there does seem to be possibly some localized peritonitis in  the right mid to right upper quadrant.   ED Results / Procedures / Treatments   Labs (all labs ordered are listed, but only abnormal results are displayed) Labs Reviewed  COMPREHENSIVE METABOLIC PANEL WITH GFR - Abnormal; Notable for the following components:      Result Value   Creatinine, Ser 0.34 (*)    Calcium 8.1 (*)    All other components within normal limits  CBC - Abnormal; Notable for the following components:   RBC 3.53 (*)    Hemoglobin 10.9 (*)    HCT 33.1 (*)    All other components within normal limits  URINALYSIS, ROUTINE W REFLEX MICROSCOPIC - Abnormal; Notable for the following components:   Color, Urine YELLOW (*)    APPearance CLEAR (*)    All other components within normal limits  LIPASE, BLOOD  POC URINE PREG, ED   RADIOLOGY  Discussed with the patient obtaining CT imaging especially in light of the fact that she has had multiple CTs in the past.  However,  today her symptoms seem to be slightly different than her historical chronic pain.  She feels more of a bloated sensation reports not passing any gas and the pain seems reliably in the right mid to right upper abdomen.  Will proceed with CT imaging to further delineate, shared medical decision making applied   CT abdomen pending, Dr. Jacolyn to follow   PROCEDURES:  Critical Care performed: No  Procedures   MEDICATIONS ORDERED IN ED: Medications  morphine  (PF) 4 MG/ML injection 4 mg (4 mg Intravenous Given 01/02/24 1441)  ondansetron  (ZOFRAN ) injection 4 mg (4 mg Intravenous Given 01/02/24 1441)  iohexol  (OMNIPAQUE ) 300 MG/ML solution 100 mL (100 mLs Intravenous Contrast Given 01/02/24 1511)    Normal LFTs and comprehensive metabolic panel.  Urinalysis without evidence infection.  Negative pregnancy test.   IMPRESSION / MDM / ASSESSMENT AND PLAN / ED COURSE  I reviewed the triage vital signs and the nursing notes.                              Differential diagnosis includes but is not limited to, abdominal perforation, aortic dissection, cholecystitis, appendicitis, diverticulitis, colitis, esophagitis/gastritis, kidney stone, pyelonephritis, urinary tract infection, aortic aneurysm. All are considered in decision and treatment plan. Based upon the patient's presentation and risk factors, and given her complicated medical history of surgeries we will proceed by obtaining a workup including CT imaging, provide morphine  and Zofran  for pain control.  Awaiting laboratory testing at this time.  Pain seems primarily in the right mid to right upper abdomen, retrocecal appendix could be a consideration as cause, hepatobiliary, obstruction, colitis diverticulitis etc.  The differential diagnosis been quite broad especially complex in this patient with complex history of abdominal surgeries.   Patient's presentation is most consistent with acute complicated illness / injury requiring diagnostic  workup.   Patient has had previous cholecystectomy.  Patient does report having appendix.   Clinical Course as of 01/02/24 1535  Sat Jan 02, 2024  1434 Urinalysis without acute abnormality.  Negative pregnancy test.  Based on her symptoms location of pain and description, doubt acute gynecologic process as cause.  She does not have any lower pelvic symptoms or symptoms that would be highly suggestive of an acute ovarian process. [MQ]  1434 CBC shows mild anemia this is chronic.  White count normal [MQ]  1502 Appears comfortable without distress [MQ]    Clinical  Course User Index [MQ] Dicky Anes, MD   Ongoing care assigned to Dr Jacolyn plan for reevaluation and follow-up on CT abdomen pelvis.  FINAL CLINICAL IMPRESSION(S) / ED DIAGNOSES   Final diagnoses:  Right sided abdominal pain     Rx / DC Orders   ED Discharge Orders     None        Note:  This document was prepared using Dragon voice recognition software and may include unintentional dictation errors.   Dicky Anes, MD 01/02/24 1535

## 2024-01-02 NOTE — ED Provider Notes (Signed)
-----------------------------------------   4:49 PM on 01/02/2024 -----------------------------------------  I took over care of this patient from Dr. Dicky.  The CT is unremarkable.  On reassessment the patient states that she is feeling better although did want another dose of pain medication prior to discharge.  This time there is no indication for surgical consult, further ED workup, or admission.  She is stable for discharge home with close outpatient follow-up.  She feels comfortable going home.  I gave strict return precautions and she expressed understanding.  I have prescribed a small quantity of pain medication for breakthrough pain.  CT abdomen/pelvis: I independently viewed and interpreted the images; there are no dilated bowel loops or any free air or free fluid.  Radiology report indicates the following:  IMPRESSION:  1. No acute findings in the abdomen or pelvis. Stable postsurgical changes  without evidence of bowel obstruction or acute bowel inflammation.  2. Moderate diffuse colonic stool, suggesting constipation.     Jacolyn Pae, MD 01/02/24 1650

## 2024-01-16 ENCOUNTER — Encounter: Payer: Self-pay | Admitting: Emergency Medicine

## 2024-01-16 ENCOUNTER — Emergency Department
Admission: EM | Admit: 2024-01-16 | Discharge: 2024-01-16 | Disposition: A | Discharge status: Home / Self Care | Attending: Emergency Medicine | Admitting: Emergency Medicine

## 2024-01-16 ENCOUNTER — Emergency Department

## 2024-01-16 ENCOUNTER — Other Ambulatory Visit: Payer: Self-pay

## 2024-01-16 DIAGNOSIS — R7989 Other specified abnormal findings of blood chemistry: Secondary | ICD-10-CM | POA: Insufficient documentation

## 2024-01-16 DIAGNOSIS — E119 Type 2 diabetes mellitus without complications: Secondary | ICD-10-CM | POA: Insufficient documentation

## 2024-01-16 DIAGNOSIS — R3915 Urgency of urination: Secondary | ICD-10-CM | POA: Diagnosis not present

## 2024-01-16 DIAGNOSIS — R109 Unspecified abdominal pain: Secondary | ICD-10-CM | POA: Diagnosis present

## 2024-01-16 DIAGNOSIS — R600 Localized edema: Secondary | ICD-10-CM | POA: Diagnosis not present

## 2024-01-16 LAB — BASIC METABOLIC PANEL WITH GFR
Anion gap: 10 (ref 5–15)
BUN: 11 mg/dL (ref 6–20)
CO2: 22 mmol/L (ref 22–32)
Calcium: 8.1 mg/dL — ABNORMAL LOW (ref 8.9–10.3)
Chloride: 109 mmol/L (ref 98–111)
Creatinine, Ser: 0.42 mg/dL — ABNORMAL LOW (ref 0.44–1.00)
GFR, Estimated: >60 mL/min (ref >60)
Glucose, Bld: 87 mg/dL (ref 70–99)
Potassium: 3.7 mmol/L (ref 3.5–5.1)
Sodium: 141 mmol/L (ref 135–145)

## 2024-01-16 LAB — CBC
HCT: 31.3 % — ABNORMAL LOW (ref 36.0–46.0)
Hemoglobin: 10.3 g/dL — ABNORMAL LOW (ref 12.0–15.0)
MCH: 31 pg (ref 26.0–34.0)
MCHC: 32.9 g/dL (ref 30.0–36.0)
MCV: 94.3 fL (ref 80.0–100.0)
Platelets: 298 K/uL (ref 150–400)
RBC: 3.32 MIL/uL — ABNORMAL LOW (ref 3.87–5.11)
RDW: 13.9 % (ref 11.5–15.5)
WBC: 6.9 K/uL (ref 4.0–10.5)
nRBC: 0 % (ref 0.0–0.2)

## 2024-01-16 LAB — URINALYSIS, ROUTINE W REFLEX MICROSCOPIC
Bacteria, UA: NONE SEEN
Bilirubin Urine: NEGATIVE
Glucose, UA: NEGATIVE mg/dL
Ketones, ur: NEGATIVE mg/dL
Leukocytes,Ua: NEGATIVE
Nitrite: NEGATIVE
Protein, ur: NEGATIVE mg/dL
Specific Gravity, Urine: 1.017 (ref 1.005–1.030)
pH: 5 (ref 5.0–8.0)

## 2024-01-16 LAB — HEPATIC FUNCTION PANEL
ALT: 20 U/L (ref 0–44)
AST: 17 U/L (ref 15–41)
Albumin: 3.4 g/dL — ABNORMAL LOW (ref 3.5–5.0)
Alkaline Phosphatase: 85 U/L (ref 38–126)
Bilirubin, Direct: 0.1 mg/dL (ref 0.0–0.2)
Indirect Bilirubin: 0.5 mg/dL (ref 0.3–0.9)
Total Bilirubin: 0.6 mg/dL (ref 0.0–1.2)
Total Protein: 6 g/dL — ABNORMAL LOW (ref 6.5–8.1)

## 2024-01-16 LAB — PREGNANCY, URINE: Preg Test, Ur: NEGATIVE

## 2024-01-16 LAB — LIPASE, BLOOD: Lipase: 24 U/L (ref 11–51)

## 2024-01-16 LAB — BRAIN NATRIURETIC PEPTIDE: B Natriuretic Peptide: 168.4 pg/mL — ABNORMAL HIGH (ref 0.0–100.0)

## 2024-01-16 MED ORDER — ONDANSETRON 4 MG PO TBDP
4.0000 mg | ORAL_TABLET | Freq: Once | ORAL | Status: AC
  Administered 2024-01-16: 4 mg via ORAL
  Filled 2024-01-16: qty 1

## 2024-01-16 MED ORDER — HYDROCODONE-ACETAMINOPHEN 5-325 MG PO TABS
1.0000 | ORAL_TABLET | Freq: Once | ORAL | Status: AC
  Administered 2024-01-16: 1 via ORAL
  Filled 2024-01-16: qty 1

## 2024-01-16 NOTE — ED Triage Notes (Signed)
 PT sent from urgent care for further evaluation. Pt states last Thursdays tarted having urinary urgency, and discomfort and swelling in both legs.also discomfort in right flank. 2 days prior had a mechanical fall from standing position onto right lower back. Denies any bruising. Pt has noticed darker urine but did not notice any blood.

## 2024-01-16 NOTE — Discharge Instructions (Addendum)
 You were seen in the emergency room today for evaluation of urine symptoms and flank pain.  Your urine fortunately did not show signs of infection and your CT scan was reassuring.  Regarding your leg swelling, your BNP level was slightly elevated which can be a marker of poor squeeze of your heart.  Your x-Fue Cervenka was fortunately reassuring, but please follow-up with your primary care doctor for further evaluation of your symptoms.  They may recommend a ultrasound of your heart known as an echocardiogram.  Return to the ER for new or worsening symptoms including chest pain, difficulty breathing, worsening abdominal pain or any other new or concerning symptoms.

## 2024-01-16 NOTE — ED Provider Notes (Signed)
 [facility] Provider Note    Event Date/Time   First MD Initiated Contact with Patient 01/16/24 1307     (approximate)   History   Flank Pain and Urinary Frequency   HPI  Maria Fuller is a 38 year old female with history of diabetes, prior abdominal surgeries, kidney stones presenting to the emergency department for evaluation of flank pain.  On Thursday, patient had onset of urinary urgency with associated right flank pain.  Has also noticed some swelling in her bilateral lower extremities which is new for her.  She presented to urgent care where her urinalysis did not note evidence of infection, but did have some blood present for which she was sent to the emergency department.  Patient reports that her flank pain is intermittent, moderate to severe when present, but does not feel exactly like her prior kidney stones.  Reviewed discharge summary from [facility] from 08/25/2023.  At that time she presented with abdominal pain.  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     Physical Exam   Triage Vital Signs: ED Triage Vitals  Encounter Vitals Group     BP 01/16/24 1215 (!) 149/89     Girls Systolic BP Percentile --      Girls Diastolic BP Percentile --      Boys Systolic BP Percentile --      Boys Diastolic BP Percentile --      Pulse Rate 01/16/24 1215 61     Resp 01/16/24 1215 18     Temp 01/16/24 1215 99.4 F (37.4 C)     Temp Source 01/16/24 1215 Oral     SpO2 01/16/24 1215 97 %     Weight --      Height --      Head Circumference --      Peak Flow --      Pain Score 01/16/24 1324 5     Pain Loc --      Pain Education --      Exclude from Growth Chart --     Most recent vital signs: Vitals:   01/16/24 1215  BP: (!) 149/89  Pulse: 61  Resp: 18  Temp: 99.4 F (37.4 C)  SpO2: 97%     General: Awake, interactive  CV:  Regular rate,  good peripheral perfusion.  Resp:  Unlabored respirations, lungs clear to auscultation Abd:  Nondistended, soft, no significant tenderness to palpation Neuro:  Symmetric facial movement, fluid speech   ED Results / Procedures / Treatments   Labs (all labs ordered are listed, but only abnormal results are displayed) Labs Reviewed  URINALYSIS, ROUTINE W REFLEX MICROSCOPIC - Abnormal; Notable for the following components:      Result Value   Color, Urine YELLOW (*)    APPearance HAZY (*)    Hgb urine dipstick SMALL (*)    All other components within normal limits  BASIC METABOLIC PANEL WITH GFR - Abnormal; Notable for the following components:   Creatinine, Ser 0.42 (*)    Calcium 8.1 (*)    All other components within normal limits  CBC - Abnormal; Notable for the following components:   RBC 3.32 (*)    Hemoglobin 10.3 (*)    HCT 31.3 (*)    All other components within normal limits  HEPATIC FUNCTION PANEL - Abnormal; Notable for the following components:   Total Protein 6.0 (*)  Albumin 3.4 (*)    All other components within normal limits  BRAIN NATRIURETIC PEPTIDE - Abnormal; Notable for the following components:   B Natriuretic Peptide 168.4 (*)    All other components within normal limits  PREGNANCY, URINE  LIPASE, BLOOD     EKG EKG independently reviewed and interpreted by myself demonstrates:    RADIOLOGY Imaging independently reviewed and interpreted by myself demonstrates:   Formal Radiology Read:  CT Renal Stone Study Result Date: 01/16/2024 CLINICAL DATA:  Abdominal/flank pain. EXAM: CT ABDOMEN AND PELVIS WITHOUT CONTRAST TECHNIQUE: Multidetector CT imaging of the abdomen and pelvis was performed following the standard protocol without IV contrast. RADIATION DOSE REDUCTION: This exam was performed according to the departmental dose-optimization program which includes automated exposure control, adjustment of the mA and/or kV according to patient size and/or  use of iterative reconstruction technique. COMPARISON:  CT abdomen pelvis dated 09/02/2023. FINDINGS: Evaluation of this exam is limited in the absence of intravenous contrast. Lower chest: The visualized lung bases are clear. No intra-abdominal free air or free fluid. Hepatobiliary: The liver is unremarkable. No biliary dilatation. Cholecystectomy. Pancreas: Unremarkable. No pancreatic ductal dilatation or surrounding inflammatory changes. Spleen: Normal in size without focal abnormality. Adrenals/Urinary Tract: The adrenal glands unremarkable. There is no hydronephrosis or nephrolithiasis on either side. The visualized ureters and urinary bladder appear unremarkable. Stomach/Bowel: Evaluation of the bowel is limited in the absence of oral contrast. There is postsurgical changes of the stomach. There is no bowel obstruction or active inflammation. The appendix is normal. Vascular/Lymphatic: The abdominal aorta and IVC are grossly unremarkable on this noncontrast CT. No portal venous gas. There is no adenopathy. Reproductive: The uterus is anteverted. An intrauterine device is noted. No suspicious adnexal masses. Other: None Musculoskeletal: Bilateral L5 pars defects. No acute osseous pathology. IMPRESSION: 1. No acute intra-abdominal or pelvic pathology. No hydronephrosis or nephrolithiasis. 2. Postsurgical changes of the stomach. No bowel obstruction. Normal appendix. Electronically Signed   By: Vanetta Chou M.D.   On: 01/16/2024 15:00   DG Chest Portable 1 View Result Date: 01/16/2024 CLINICAL DATA:  Lower extremity swelling. EXAM: PORTABLE CHEST 1 VIEW COMPARISON:  Chest radiograph dated 03/03/2018 FINDINGS: The heart size and mediastinal contours are within normal limits. Both lungs are clear. The visualized skeletal structures are unremarkable. IMPRESSION: No active disease. Electronically Signed   By: Vanetta Chou M.D.   On: 01/16/2024 14:52    PROCEDURES:  Critical Care performed:  No  Procedures   MEDICATIONS ORDERED IN ED: Medications  HYDROcodone -acetaminophen  (NORCO/VICODIN) 5-325 MG per tablet 1 tablet (1 tablet Oral Given 01/16/24 1324)  ondansetron  (ZOFRAN -ODT) disintegrating tablet 4 mg (4 mg Oral Given 01/16/24 1324)     IMPRESSION / MDM / ASSESSMENT AND PLAN / ED COURSE  I reviewed the triage vital signs and the nursing notes.  Differential diagnosis includes, but is not limited to, UTI, pyelonephritis, renal stone, lower suspicion other acute intra-abdominal process given overall reassuring abdominal exam  Patient's presentation is most consistent with acute presentation with potential threat to life or bodily function.  38 year old female presenting to the emergency department for evaluation of flank pain and urinary urgency.  CBC with stable anemia.  BMP with reassuring creatinine at 0.42.  Normal LFTs.  Patient additionally reporting lower extremity swelling.  BNP slightly elevated but x-Rosaly Labarbera without evidence of pulmonary edema.  Urinalysis without evidence of protein or infection.  Only small hemoglobin noted without significant RBCs.  CT without evidence of renal stone.  Patient  reassessed.  Reports improved symptoms and updated on results of workup.  With her leg swelling and mildly elevated BNP, do think patient would benefit from further evaluation for possible CHF.  She is satting appropriately on room air without evidence of respiratory distress, no appreciable pulmonary edema on her x-[place]aland Boyett.  Do think this is reasonable performed as an outpatient.  Patient is comfortable with this plan.  She will follow-up with her primary care doctor.  Will provide elective information for follow-up with cardiology.  Strict return precautions provided.  Patient discharged stable condition.      FINAL CLINICAL IMPRESSION(S) / ED DIAGNOSES   Final diagnoses:  Urinary urgency  Right flank pain  Lower extremity edema  Elevated brain natriuretic peptide (BNP) level      Rx / DC Orders   ED Discharge Orders          Ordered    Ambulatory referral to Cardiology       Comments: If you have not heard from the Cardiology office within the next 72 hours please call [phone].   01/16/24 1512             Note:  This document was prepared using Dragon voice recognition software and may include unintentional dictation errors.   Levander Slate, MD 01/16/24 [phone]

## 2024-02-20 ENCOUNTER — Other Ambulatory Visit: Payer: Self-pay

## 2024-02-20 ENCOUNTER — Emergency Department

## 2024-02-20 ENCOUNTER — Emergency Department
Admission: EM | Admit: 2024-02-20 | Discharge: 2024-02-20 | Disposition: A | Discharge status: Home / Self Care | Attending: Emergency Medicine | Admitting: Emergency Medicine

## 2024-02-20 DIAGNOSIS — R112 Nausea with vomiting, unspecified: Secondary | ICD-10-CM | POA: Diagnosis present

## 2024-02-20 DIAGNOSIS — R1084 Generalized abdominal pain: Secondary | ICD-10-CM

## 2024-02-20 DIAGNOSIS — K209 Esophagitis, unspecified without bleeding: Secondary | ICD-10-CM | POA: Insufficient documentation

## 2024-02-20 DIAGNOSIS — R600 Localized edema: Secondary | ICD-10-CM | POA: Insufficient documentation

## 2024-02-20 DIAGNOSIS — R7989 Other specified abnormal findings of blood chemistry: Secondary | ICD-10-CM | POA: Insufficient documentation

## 2024-02-20 LAB — URINALYSIS, ROUTINE W REFLEX MICROSCOPIC
Bilirubin Urine: NEGATIVE
Glucose, UA: NEGATIVE mg/dL
Hgb urine dipstick: NEGATIVE
Ketones, ur: NEGATIVE mg/dL
Leukocytes,Ua: NEGATIVE
Nitrite: NEGATIVE
Protein, ur: NEGATIVE mg/dL
Specific Gravity, Urine: 1.029 (ref 1.005–1.030)
pH: 6 (ref 5.0–8.0)

## 2024-02-20 LAB — COMPREHENSIVE METABOLIC PANEL WITH GFR
ALT: 28 U/L (ref 0–44)
AST: 27 U/L (ref 15–41)
Albumin: 4 g/dL (ref 3.5–5.0)
Alkaline Phosphatase: 106 U/L (ref 38–126)
Anion gap: 8 (ref 5–15)
BUN: 12 mg/dL (ref 6–20)
CO2: 21 mmol/L — ABNORMAL LOW (ref 22–32)
Calcium: 8 mg/dL — ABNORMAL LOW (ref 8.9–10.3)
Chloride: 113 mmol/L — ABNORMAL HIGH (ref 98–111)
Creatinine, Ser: 0.63 mg/dL (ref 0.44–1.00)
GFR, Estimated: >60 mL/min (ref >60)
Glucose, Bld: 78 mg/dL (ref 70–99)
Potassium: 3.5 mmol/L (ref 3.5–5.1)
Sodium: 142 mmol/L (ref 135–145)
Total Bilirubin: 0.9 mg/dL (ref 0.0–1.2)
Total Protein: 7 g/dL (ref 6.5–8.1)

## 2024-02-20 LAB — CBC
HCT: 32.3 % — ABNORMAL LOW (ref 36.0–46.0)
Hemoglobin: 10.4 g/dL — ABNORMAL LOW (ref 12.0–15.0)
MCH: 30.5 pg (ref 26.0–34.0)
MCHC: 32.2 g/dL (ref 30.0–36.0)
MCV: 94.7 fL (ref 80.0–100.0)
Platelets: 327 K/uL (ref 150–400)
RBC: 3.41 MIL/uL — ABNORMAL LOW (ref 3.87–5.11)
RDW: 14.3 % (ref 11.5–15.5)
WBC: 8 K/uL (ref 4.0–10.5)
nRBC: 0 % (ref 0.0–0.2)

## 2024-02-20 LAB — LIPASE, BLOOD: Lipase: 23 U/L (ref 11–51)

## 2024-02-20 LAB — POC URINE PREG, ED: Preg Test, Ur: NEGATIVE

## 2024-02-20 MED ORDER — MORPHINE SULFATE (PF) 4 MG/ML IV SOLN
4.0000 mg | Freq: Once | INTRAVENOUS | Status: AC
  Administered 2024-02-20: 4 mg via INTRAVENOUS
  Filled 2024-02-20: qty 1

## 2024-02-20 MED ORDER — SUCRALFATE 1 GM/10ML PO SUSP: 1.0000 g | Freq: Three times a day (TID) | ORAL | 1 refills | Status: DC | PRN

## 2024-02-20 MED ORDER — FENTANYL CITRATE (PF) 50 MCG/ML IJ SOSY
50.0000 ug | PREFILLED_SYRINGE | Freq: Once | INTRAMUSCULAR | Status: AC
  Administered 2024-02-20: 50 ug via INTRAVENOUS
  Filled 2024-02-20: qty 1

## 2024-02-20 MED ORDER — LACTATED RINGERS IV BOLUS
1000.0000 mL | Freq: Once | INTRAVENOUS | Status: AC
  Administered 2024-02-20: 1000 mL via INTRAVENOUS

## 2024-02-20 MED ORDER — IOHEXOL 300 MG/ML  SOLN
100.0000 mL | Freq: Once | INTRAMUSCULAR | Status: AC | PRN
  Administered 2024-02-20: 100 mL via INTRAVENOUS

## 2024-02-20 MED ORDER — HYDROCODONE-ACETAMINOPHEN 5-325 MG PO TABS: 1.0000 | ORAL_TABLET | Freq: Four times a day (QID) | ORAL | 0 refills | Status: AC | PRN

## 2024-02-20 MED ADMIN — Ondansetron HCl Inj 4 MG/2ML (2 MG/ML): 4 mg | INTRAVENOUS | NDC 00409475518

## 2024-02-20 MED FILL — Ondansetron HCl Inj 4 MG/2ML (2 MG/ML): 4.0000 mg | INTRAMUSCULAR | Qty: 2 | Status: AC

## 2024-02-20 NOTE — Progress Notes (Unsigned)
 Cardiology Office Note  Date:  02/22/2024   ID:  Nichol, Ator 30-Jul-1985, MRN [id number]  PCP:  Perri Constance Sor, PA-C   Chief Complaint  Patient presents with   New Patient (Initial Visit)    Ref by Dr. Nilsa Dade for evaluation of elevation of her BNP. Patient c/o having a low grade fever this am of 100.4 with feeling nauseated, right side pain and abdominal pain, racing heartbeats, shortness of breath, bilateral LE edema daily and chest pressure 2-3 times weekly.     HPI:  MYSTIC Maria Fuller is a 38 y.o. female with past medical history of: Anemia, Bradycardia, Depression,  Diabetes mellitus without complication (HCC), Headache,  High cholesterol, History of kidney stones,  Hypertension, Incarcerated incisional hernia (07/27/2021), Low BP, Meningitis (2015), Partial small bowel obstruction (HCC) (08/04/2019), PCOS (polycystic ovarian syndrome),  Sleep apnea, Small bowel obstruction (HCC),  Syncope, and Vertigo (couple years ago). 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  Cardiac catheterization 2019 normal ejection fraction, no coronary artery disease Who presents by referral from Dr. Nilsa Dade for consultation of her lower extremity edema, elevated BNP  Frequent visits to the emergency room over the past year Abdominal pain, obstruction  Recently seen in the ER January 16, 2024 flank pain and urinary urgency  Hemoglobin 10.3, BNP 168, albumin 3.4 up to 4.0 Low potassium  Overall feels well, denies significant shortness of breath For the past 1 month has had waxing waning trace edema Worse when standing up for long periods of time Fatigue lately  Prior cardiac workup Remote hx of syncope 2019 Cath, no CAD normal ejection fraction  Echo 2019 NORMAL LEFT VENTRICULAR SYSTOLIC FUNCTION WITH MILD LVH  NORMAL RIGHT VENTRICULAR SYSTOLIC FUNCTION  MILD VALVULAR  REGURGITATION  NO VALVULAR STENOSIS  MILD MR, TR  EF 55%   EKG personally reviewed by myself on todays visit EKG Interpretation Date/Time:  Monday February 22 2024 16:14:45 EDT Ventricular Rate:  55 PR Interval:  168 QRS Duration:  92 QT Interval:  442 QTC Calculation: 422 R Axis:   4  Text Interpretation: Sinus bradycardia When compared with ECG of 16-Feb-2022 13:29, No significant change was found Confirmed by Perla Lye [phone]) on 02/22/2024 4:26:43 PM    PMH:   has a past medical history of Anemia, Bradycardia, Depression, Diabetes mellitus without complication (HCC), Headache, High cholesterol, History of kidney stones, Hypertension, Incarcerated incisional hernia (07/27/2021), Low BP, Meningitis (2015), Partial small bowel obstruction (HCC) (08/04/2019), PCOS (polycystic ovarian syndrome), Preterm labor, Recurrent ventral incisional hernia, Sleep apnea, Small bowel obstruction (HCC), Syncope, and Vertigo (couple years ago).   PSH:    Past Surgical History:  Procedure Laterality Date   BIOPSY  11/26/2022   Procedure: BIOPSY;  Surgeon: Unk Corinn Skiff, MD;  Location: [facility];  Service: Gastroenterology;;   CESAREAN SECTION     CESAREAN SECTION N/A 05/07/2016   Procedure: CESAREAN SECTION;  Surgeon: Lamar SHAUNNA Lesches, MD;  Location: [facility];  Service: Obstetrics;  Laterality: N/A;  Time of birth: [birth year] Sex: Female Weight: 3940 kg, 8 lb 11 oz   CHOLECYSTECTOMY     COLON SURGERY     COLONOSCOPY WITH PROPOFOL  N/A 03/16/2023   Procedure: COLONOSCOPY WITH PROPOFOL ;  Surgeon: Maryruth Ole DASEN, MD;  Location: [facility];  Service: Endoscopy;  Laterality: N/A;   CYSTOSCOPY WITH STENT PLACEMENT Right 09/19/2016   Procedure: CYSTOSCOPY WITH STENT PLACEMENT;  Surgeon: Chauncey Redell Agent,  MD;  Location: [facility];  Service: Urology;  Laterality: Right;   CYSTOSCOPY WITH STENT PLACEMENT Left 04/18/2022   Procedure: CYSTOSCOPY WITH STENT PLACEMENT;  Surgeon: Watt Rush,  MD;  Location: [facility];  Service: Urology;  Laterality: Left;   CYSTOSCOPY/URETEROSCOPY/HOLMIUM LASER/STENT PLACEMENT Left 05/08/2022   Procedure: CYSTOSCOPY/URETEROSCOPY/HOLMIUM LASER/STENT EXCHANGE;  Surgeon: Penne Knee, MD;  Location: [facility];  Service: Urology;  Laterality: Left;   DIAGNOSTIC LAPAROSCOPY     ESOPHAGOGASTRODUODENOSCOPY (EGD) WITH PROPOFOL  N/A 11/23/2022   Procedure: ESOPHAGOGASTRODUODENOSCOPY (EGD) WITH PROPOFOL ;  Surgeon: Therisa Bi, MD;  Location: [facility];  Service: Gastroenterology;  Laterality: N/A;   ESOPHAGOGASTRODUODENOSCOPY (EGD) WITH PROPOFOL  N/A 11/26/2022   Procedure: ESOPHAGOGASTRODUODENOSCOPY (EGD) WITH PROPOFOL ;  Surgeon: Unk Corinn Skiff, MD;  Location: [facility];  Service: Gastroenterology;  Laterality: N/A;   EXTRACORPOREAL SHOCK WAVE LITHOTRIPSY Left 05/15/2022   Procedure: EXTRACORPOREAL SHOCK WAVE LITHOTRIPSY (ESWL);  Surgeon: Penne Knee, MD;  Location: [facility];  Service: Urology;  Laterality: Left;   FLEXIBLE SIGMOIDOSCOPY N/A 07/28/2019   Procedure: FLEXIBLE SIGMOIDOSCOPY;  Surgeon: Unk Corinn Skiff, MD;  Location: [facility];  Service: Endoscopy;  Laterality: N/A;   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   HERNIA REPAIR     INSERTION OF MESH N/A 07/07/2022   Procedure: INSERTION OF MESH;  Surgeon: Lane Shope, MD;  Location: [facility];  Service: General;  Laterality: N/A;   LAPAROSCOPIC OVARIAN CYSTECTOMY Right 09/28/2018   Procedure: LAPAROSCOPIC RIGHT OVARIAN CYSTECTOMY;  Surgeon: Arloa Lamar SQUIBB, MD;  Location: [facility];  Service: Gynecology;  Laterality: Right;   LAPAROSCOPIC UNILATERAL SALPINGO OOPHERECTOMY Right 07/25/2021   Procedure: LAPAROSCOPIC UNILATERAL SALPINGO OOPHORECTOMY;  Surgeon: Arloa Lamar SQUIBB, MD;  Location: [facility];  Service: Gynecology;  Laterality: Right;   LEFT HEART CATH AND CORONARY ANGIOGRAPHY Left 04/07/2018   Procedure: LEFT HEART CATH AND CORONARY ANGIOGRAPHY;  Surgeon:  Florencio Cara BIRCH, MD;  Location: [facility];  Service: Cardiovascular;  Laterality: Left;   OOPHORECTOMY     POLYPECTOMY  03/16/2023   Procedure: POLYPECTOMY;  Surgeon: Maryruth Ole DASEN, MD;  Location: [facility];  Service: Endoscopy;;   small bowel obstruction  07/2019   TONSILECTOMY, ADENOIDECTOMY, BILATERAL MYRINGOTOMY AND TUBES     TONSILLECTOMY     URETEROSCOPY WITH HOLMIUM LASER LITHOTRIPSY Right 09/19/2016   Procedure: URETEROSCOPY WITH HOLMIUM LASER LITHOTRIPSY;  Surgeon: Chauncey Redell Agent, MD;  Location: [facility];  Service: Urology;  Laterality: Right;   VENTRAL HERNIA REPAIR     scheduled for 07/07/2022   XI ROBOTIC ASSISTED VENTRAL HERNIA N/A 07/27/2021   Procedure: XI ROBOTIC ASSISTED INCARCERATED INCISIONAL HERNIA REPAIR;  Surgeon: Lane Shope, MD;  Location: [facility];  Service: General;  Laterality: N/A;   XI ROBOTIC ASSISTED VENTRAL HERNIA N/A 07/07/2022   Procedure: XI ROBOTIC ASSISTED VENTRAL HERNIA, recurrent incisional with mesh;  Surgeon: Lane Shope, MD;  Location: [facility] ORS;  Service: General;  Laterality: N/A;    Current Outpatient Medications  Medication Sig Dispense Refill   buPROPion  (WELLBUTRIN  XL) 150 MG 24 hr tablet Take 150 mg by mouth every morning.     clonazePAM (KLONOPIN) 0.5 MG tablet Take 0.5 mg by mouth daily.     dicyclomine  (BENTYL ) 10 MG capsule Take 1 capsule (10 mg total) by mouth 4 (four) times daily -  before meals and at bedtime. 30 capsule 1   gabapentin  (NEURONTIN ) 600 MG tablet Take 600 mg by mouth 3 (three) times daily.     HYDROcodone -acetaminophen  (NORCO/VICODIN) 5-325  MG tablet Take 1 tablet by mouth every 6 (six) hours as needed for up to 3 days for severe pain (pain score 7-10). 12 tablet 0   hydrOXYzine (ATARAX) 25 MG tablet Take 1 tablet by mouth 3 (three) times daily as needed.     ketoconazole (NIZORAL) 2 % cream Apply 1 Application topically daily as needed.     levonorgestrel  (MIRENA ) 20 MCG/24HR IUD 1  each by Intrauterine route once.     methocarbamol (ROBAXIN) 500 MG tablet Take 500 mg by mouth 3 (three) times daily.     nystatin (MYCOSTATIN/NYSTOP) powder Apply 1 Application topically as needed.     ondansetron  (ZOFRAN -ODT) 4 MG disintegrating tablet Take 1 tablet (4 mg total) by mouth every 8 (eight) hours as needed for nausea or vomiting. 30 tablet 0   ondansetron  (ZOFRAN -ODT) 4 MG disintegrating tablet Take 1 tablet (4 mg total) by mouth every 8 (eight) hours as needed. 20 tablet 0   pantoprazole  (PROTONIX ) 40 MG tablet Take 1 tablet (40 mg total) by mouth 2 (two) times daily before a meal. 60 tablet 0   polyethylene glycol powder (GLYCOLAX /MIRALAX ) 17 GM/SCOOP powder Take 17 g by mouth daily. 255 g 0   promethazine  (PHENERGAN ) 25 MG suppository Place 1 suppository (25 mg total) rectally every 6 (six) hours as needed for nausea or vomiting. 12 each 0   scopolamine  (TRANSDERM-SCOP) 1 MG/3DAYS 1 patch every three (3) days as needed.     sertraline  (ZOLOFT ) 100 MG tablet Take 100 mg by mouth 2 (two) times daily.     sucralfate (CARAFATE) 1 GM/10ML suspension Take 10 mLs (1 g total) by mouth 3 (three) times daily as needed. 420 mL 1   traZODone  (DESYREL ) 50 MG tablet Take 50 mg by mouth at bedtime.      No current facility-administered medications for this visit.     Allergies:   Cefuroxime, Ceftin [cefuroxime axetil], Penicillins, and Prochlorperazine    Social History:  The patient  reports that she has never smoked. She has never been exposed to tobacco smoke. She has never used smokeless tobacco. She reports that she does not drink alcohol and does not use drugs.   Family History:   family history includes Bladder Cancer in her father; Breast cancer in her paternal grandmother; Depression in her father; Diabetes in her father, paternal grandfather, and paternal grandmother; Endometriosis in her sister; Polycystic ovary syndrome in her mother and sister.    Review of  Systems: ROS   PHYSICAL EXAM: VS:  BP (!) 140/84 (BP Location: Right Arm, Patient Position: Sitting, Cuff Size: Normal)   Pulse (!) 55   Temp 98.6 F (37 C)   Ht 6' 1 (1.854 m)   Wt 265 lb 8 oz (120.4 kg)   SpO2 98%   BMI 35.03 kg/m  , BMI Body mass index is 35.03 kg/m. GEN: Well nourished, well developed, in no acute distress HEENT: normal Neck: no JVD, carotid bruits, or masses Cardiac: RRR; no murmurs, rubs, or gallops,no edema  Respiratory:  clear to auscultation bilaterally, normal work of breathing GI: soft, nontender, nondistended, + BS MS: no deformity or atrophy Skin: warm and dry, no rash Neuro:  Strength and sensation are intact Psych: euthymic mood, full affect  Recent Labs: 01/16/2024: B Natriuretic Peptide 168.4 02/20/2024: ALT 28; BUN 12; Creatinine, Ser 0.63; Hemoglobin 10.4; Platelets 327; Potassium 3.5; Sodium 142    Lipid Panel No results found for: CHOL, HDL, LDLCALC, TRIG    Wt Readings from  Last 3 Encounters:  02/22/24 265 lb 8 oz (120.4 kg)  02/20/24 260 lb (117.9 kg)  01/02/24 260 lb (117.9 kg)       ASSESSMENT AND PLAN:  Problem List Items Addressed This Visit     Lower extremity edema - Primary   Relevant Orders   EKG 12-Lead (Completed)   Elevated brain natriuretic peptide (BNP) level   Relevant Orders   EKG 12-Lead (Completed)   Lower extremity edema Appears relatively euvolemic Denies significant symptoms of shortness of breath -Some trace ankle swelling when standing for long periods of time, minimal on today's visit - She does report that sometimes swelling is worse, seems to wax and wane - Swelling could be exacerbated by anemia, recent hemoglobin 10.4 - Recommend compression hose, if symptoms do not improve suggest she try Lasix 20 mg as needed with potassium 20 - Would likely not need to take diuretic on a regular basis and suspect this could be taken sparingly - Suspect that leg swelling will improve as anemia  improves  Elevated BNP Minimally elevated, no signs of significant fluid overload -Likely from anemia Denies significant shortness of breath, minimal edema on today's visit Suggested Lasix as needed when ankle swelling gets worse  Small bowel obstruction Prior surgeries, chronic pain, motility disorder Unfortunately she has had frequent trips to the emergency room for pain relief, nausea vomiting  Seen in consultation for Dr. Levander and will be referred to primary care for ongoing care of the issues detailed above  Signed, Velinda Lunger, M.D., Ph.D. [facility] [place], [place] [phone]

## 2024-02-20 NOTE — Discharge Instructions (Signed)
 Please take the pain medication sparingly and be aware that it may cause constipation.  Make sure you are drinking a lot of water  while taking it.  Follow-up with your primary care provider, gastroenterologist, or pain management specialist if symptoms are persistent.  Return to the emergency department for symptoms of concern.

## 2024-02-20 NOTE — ED Notes (Signed)
 Pt given DC instructions. Pt verbalized understanding of medication and follow up care. Pt ambulatory from ED without difficulty.

## 2024-02-20 NOTE — ED Provider Notes (Signed)
 [facility] Provider Note    Event Date/Time   First MD Initiated Contact with Patient 02/20/24 1218     (approximate)   History   Nausea and Emesis   HPI  Maria Fuller is a 38 y.o. female with history of multiple abdominal surgeries including duodenal switch, incisional hernia x 2, exploratory laparotomy and as listed in EMR presents to the emergency department for treatment and evaluation.  Patient reports that she feels that her food just sort of sits in the middle of her chest after she eats and then after some time she vomits.  She has been unable to tolerate food or fluids since last night.  She is also having some pain underneath the left arm on the lateral chest wall.  Very similar symptoms in the past and it was discovered on CT that she had food bolus.  This was likely related to buildup of high-protein foods that is required in her diet after having her gastric bypass.  She has noticed the pressure in the midsternal chest since Thursday but did not develop nausea, vomiting, or abdominal pain until yesterday.   Physical Exam    Vitals:   02/20/24 1204 02/20/24 1447  BP: (!) 147/81 122/70  Pulse: 99 66  Resp: 18 15  Temp: 98.1 F (36.7 C) 97.9 F (36.6 C)  SpO2: 100% 100%    General: Awake, no distress.  CV:  Good peripheral perfusion.  Resp:  Normal effort.  Abd:  No distention.  Transverse mid abdominal tenderness. Other:     ED Results / Procedures / Treatments   Labs (all labs ordered are listed, but only abnormal results are displayed)  Labs Reviewed  COMPREHENSIVE METABOLIC PANEL WITH GFR - Abnormal; Notable for the following components:      Result Value   Chloride 113 (*)    CO2 21 (*)    Calcium 8.0 (*)    All other components within normal limits  CBC - Abnormal; Notable for the following components:   RBC 3.41 (*)    Hemoglobin 10.4 (*)    HCT 32.3 (*)    All other components within normal limits  URINALYSIS,  ROUTINE W REFLEX MICROSCOPIC - Abnormal; Notable for the following components:   Color, Urine STRAW (*)    APPearance CLEAR (*)    All other components within normal limits  LIPASE, BLOOD  POC URINE PREG, ED     EKG  Not indicated.   RADIOLOGY  Image and radiology report reviewed and interpreted by me. Radiology report consistent with the same.  CT chest abdomen pelvis with contrast shows no acute abdominal or pelvic findings, mass, lesions, or adenopathy.  Status postcholecystectomy with no biliary dilatation.  Surgical changes involving the stomach with distal gastrectomy and gastrojejunostomy without complicating features  PROCEDURES:  Critical Care performed: No  Procedures   MEDICATIONS ORDERED IN ED:  Medications  ondansetron  (ZOFRAN ) injection 4 mg (4 mg Intravenous Given 02/20/24 1240)  morphine  (PF) 4 MG/ML injection 4 mg (4 mg Intravenous Given 02/20/24 1240)  lactated ringers  bolus 1,000 mL (0 mLs Intravenous Stopped 02/20/24 1449)  iohexol  (OMNIPAQUE ) 300 MG/ML solution 100 mL (100 mLs Intravenous Contrast Given 02/20/24 1338)  fentaNYL  (SUBLIMAZE ) injection 50 mcg (50 mcg Intravenous Given 02/20/24 1447)     IMPRESSION / MDM / ASSESSMENT AND PLAN / ED COURSE   I have reviewed the triage note and vital signs. Vital signs are stable   Differential diagnosis includes, but  is not limited to, food bolus, small bowel obstruction, retained bowel gas, acute on chronic pain  Patient's presentation is most consistent with acute presentation with potential threat to life or bodily function.  38 year old female presenting to the emergency department for treatment and evaluation of abdominal pain with nausea and vomiting and sensation that food is not passing through the esophagus into the stomach since yesterday.  See HPI for further details.  Symptoms are consistent with previous food bolus.  Plan will be to get a CT with contrast of the chest, abdomen and pelvis to  evaluate for esophageal obstruction, small bowel obstruction, or intra-abdominal infection.  IV fluids, nausea medication, and pain medications ordered as well.  CT of the chest, abdomen, and pelvis is reassuring.  I also considered cardiac workup with labs and EKG however patient denies any specific chest pain or shortness of breath and has no history of CAD.   CT results are reassuring.  Patient's pain and nausea has significantly decreased with medication and IV fluids.  She is going to attempt ice chips.  Medications submitted to patient's pharmacy to help with esophagitis and abdominal pain.  She was encouraged to follow-up with her primary care provider, GI specialist, or pain management provider.  If symptoms change or worsen and she is unable to be evaluated by any of the above, she is to return to the emergency department.  Patient is comfortable with this plan.      FINAL CLINICAL IMPRESSION(S) / ED DIAGNOSES   Final diagnoses:  Generalized abdominal pain  Esophagitis     Rx / DC Orders   ED Discharge Orders          Ordered    sucralfate (CARAFATE) 1 GM/10ML suspension  3 times daily PRN        02/20/24 1438    HYDROcodone -acetaminophen  (NORCO/VICODIN) 5-325 MG tablet  Every 6 hours PRN        02/20/24 1438             Note:  This document was prepared using Dragon voice recognition software and may include unintentional dictation errors.   Herlinda Kirk NOVAK, FNP 02/20/24 1455    Levander Slate, MD 02/20/24 [phone]

## 2024-02-20 NOTE — ED Triage Notes (Signed)
 Pt to ED for c/o n/v that began yesterday. Pt states when she tries to eat it just comes right back up. Pt denies diarrhea. Pt states significant abdominal history with obstructions and things like that.

## 2024-02-22 ENCOUNTER — Ambulatory Visit: Attending: Cardiovascular Disease | Admitting: Cardiovascular Disease

## 2024-02-22 ENCOUNTER — Encounter: Payer: Self-pay | Admitting: Cardiovascular Disease

## 2024-02-22 VITALS — BP 140/84 | HR 55 | Temp 98.6°F | Ht 73.0 in | Wt 265.5 lb

## 2024-02-22 DIAGNOSIS — R7989 Other specified abnormal findings of blood chemistry: Secondary | ICD-10-CM

## 2024-02-22 DIAGNOSIS — R600 Localized edema: Secondary | ICD-10-CM

## 2024-02-22 MED ORDER — FUROSEMIDE 20 MG PO TABS
20.0000 mg | ORAL_TABLET | Freq: Every day | ORAL | 3 refills | Status: AC | PRN
Start: 2024-02-22 — End: ?

## 2024-02-22 MED ORDER — POTASSIUM CHLORIDE CRYS ER 20 MEQ PO TBCR: 20.0000 meq | EXTENDED_RELEASE_TABLET | Freq: Every day | ORAL | 3 refills | Status: AC | PRN

## 2024-02-22 NOTE — Patient Instructions (Addendum)
 Medication Instructions:  Please take lasix/furosemide 20 mg daily as needed Potassium 20 meq daily as needed when you take lasix  If you need a refill on your cardiac medications before your next appointment, please call your pharmacy.   Lab work: No new labs needed  Testing/Procedures: No new testing needed  Follow-Up: At [facility], you and your health needs are our priority.  As part of our continuing mission to provide you with exceptional heart care, we have created designated Provider Care Teams.  These Care Teams include your primary Cardiologist (physician) and Advanced Practice Providers (APPs -  Physician Assistants and Nurse Practitioners) who all work together to provide you with the care you need, when you need it.  You will need a follow up appointment as needed  Providers on your designated Care Team:   Lonni Meager, NP Bernardino Bring, PA-C Cadence [place], [place]  COVID-19 Vaccine Information can be found at: [email or web address] For questions related to vaccine distribution or appointments, please email [email or web address] or call [phone].

## 2024-02-24 ENCOUNTER — Emergency Department
Admission: EM | Admit: 2024-02-24 | Discharge: 2024-02-24 | Disposition: A | Discharge status: Home / Self Care | Attending: Emergency Medicine | Admitting: Emergency Medicine

## 2024-02-24 ENCOUNTER — Encounter: Payer: Self-pay | Admitting: Intensive Care

## 2024-02-24 ENCOUNTER — Emergency Department

## 2024-02-24 ENCOUNTER — Other Ambulatory Visit: Payer: Self-pay

## 2024-02-24 DIAGNOSIS — I10 Essential (primary) hypertension: Secondary | ICD-10-CM | POA: Insufficient documentation

## 2024-02-24 DIAGNOSIS — E039 Hypothyroidism, unspecified: Secondary | ICD-10-CM | POA: Diagnosis not present

## 2024-02-24 DIAGNOSIS — E119 Type 2 diabetes mellitus without complications: Secondary | ICD-10-CM | POA: Diagnosis not present

## 2024-02-24 DIAGNOSIS — R197 Diarrhea, unspecified: Secondary | ICD-10-CM | POA: Insufficient documentation

## 2024-02-24 DIAGNOSIS — R112 Nausea with vomiting, unspecified: Secondary | ICD-10-CM | POA: Insufficient documentation

## 2024-02-24 LAB — COMPREHENSIVE METABOLIC PANEL WITH GFR
ALT: 22 U/L (ref 0–44)
AST: 22 U/L (ref 15–41)
Albumin: 3.7 g/dL (ref 3.5–5.0)
Alkaline Phosphatase: 102 U/L (ref 38–126)
Anion gap: 8 (ref 5–15)
BUN: 12 mg/dL (ref 6–20)
CO2: 26 mmol/L (ref 22–32)
Calcium: 8.1 mg/dL — ABNORMAL LOW (ref 8.9–10.3)
Chloride: 107 mmol/L (ref 98–111)
Creatinine, Ser: 0.42 mg/dL — ABNORMAL LOW (ref 0.44–1.00)
GFR, Estimated: >60 mL/min (ref >60)
Glucose, Bld: 88 mg/dL (ref 70–99)
Potassium: 3.7 mmol/L (ref 3.5–5.1)
Sodium: 141 mmol/L (ref 135–145)
Total Bilirubin: 0.8 mg/dL (ref 0.0–1.2)
Total Protein: 7.3 g/dL (ref 6.5–8.1)

## 2024-02-24 LAB — LIPASE, BLOOD: Lipase: 21 U/L (ref 11–51)

## 2024-02-24 LAB — URINALYSIS, ROUTINE W REFLEX MICROSCOPIC
Bilirubin Urine: NEGATIVE
Glucose, UA: NEGATIVE mg/dL
Hgb urine dipstick: NEGATIVE
Ketones, ur: NEGATIVE mg/dL
Leukocytes,Ua: NEGATIVE
Nitrite: NEGATIVE
Protein, ur: NEGATIVE mg/dL
Specific Gravity, Urine: 1.004 — ABNORMAL LOW (ref 1.005–1.030)
pH: 6 (ref 5.0–8.0)

## 2024-02-24 LAB — CBC
HCT: 32.2 % — ABNORMAL LOW (ref 36.0–46.0)
Hemoglobin: 10.6 g/dL — ABNORMAL LOW (ref 12.0–15.0)
MCH: 30.6 pg (ref 26.0–34.0)
MCHC: 32.9 g/dL (ref 30.0–36.0)
MCV: 93.1 fL (ref 80.0–100.0)
Platelets: 333 K/uL (ref 150–400)
RBC: 3.46 MIL/uL — ABNORMAL LOW (ref 3.87–5.11)
RDW: 14.2 % (ref 11.5–15.5)
WBC: 7.9 K/uL (ref 4.0–10.5)
nRBC: 0 % (ref 0.0–0.2)

## 2024-02-24 LAB — POC URINE PREG, ED: Preg Test, Ur: NEGATIVE

## 2024-02-24 MED ORDER — ONDANSETRON HCL 4 MG/2ML IJ SOLN
4.0000 mg | Freq: Once | INTRAMUSCULAR | Status: AC
  Administered 2024-02-24: 4 mg via INTRAVENOUS
  Filled 2024-02-24: qty 2

## 2024-02-24 MED ORDER — IOHEXOL 300 MG/ML  SOLN
100.0000 mL | Freq: Once | INTRAMUSCULAR | Status: AC | PRN
  Administered 2024-02-24: 100 mL via INTRAVENOUS

## 2024-02-24 MED ORDER — MORPHINE SULFATE (PF) 4 MG/ML IV SOLN
4.0000 mg | Freq: Once | INTRAVENOUS | Status: AC
  Administered 2024-02-24: 4 mg via INTRAVENOUS
  Filled 2024-02-24: qty 1

## 2024-02-24 NOTE — ED Provider Notes (Signed)
 [facility] Provider Note    Event Date/Time   First MD Initiated Contact with Patient 02/24/24 [phone]     (approximate)   History   Diarrhea and Emesis   HPI  Maria Fuller is a 38 y.o. female with a past medical history of small bowel obstruction, PCOS, hypertension, morbid obesity, type 2 diabetes, hyperlipidemia, ureteral stone, gastric bypass who presents today for evaluation of right lower quadrant pain, nausea, vomiting, and diarrhea.  She reports that her symptoms began approximately 4 days ago at which time she came to the emergency department for evaluation.  She reports that she no longer feels right lower quadrant pain, nausea, vomiting, and diarrhea.  She reports that her symptoms began approximately 4 days ago at which time she came to the emergency department for evaluation.  She reports that she no longer feels like she has a bolus, like she has a food bolus that she has been able to eat and drink without difficulty.  However she reports that she began to have diarrhea today, as well as bloating and right lower quadrant discomfort.  She denies any back pain or flank pain.  No fevers or chills today, though reports that on Monday she had a temperature of 100.3 F.  No blood in her stool.  No recent antibiotics.  Patient Active Problem List   Diagnosis Date Noted   Lower extremity edema 02/20/2024   Elevated brain natriuretic peptide (BNP) level 02/20/2024   SBO (small bowel obstruction) (HCC) 08/02/2023   Epigastric pain 11/24/2022   Food bolus obstruction of intestine (HCC) 11/23/2022   Depression 11/23/2022   Bradycardia 04/19/2022   Left ureteral stone 04/18/2022   Febrile urinary tract infection 04/18/2022   Major depressive disorder, recurrent, moderate (HCC) 11/16/2020   Internal hernia 08/04/2019   Right ovarian cyst 09/09/2018   Secondary oligomenorrhea 05/25/2018   Rectal pain 05/25/2018   PCOS (polycystic ovarian syndrome)  05/25/2018   Menometrorrhagia 05/25/2018   Abdominal pain, chronic, right lower quadrant 02/09/2018   Hydronephrosis 09/22/2016   Left renal stone 09/22/2016   Left flank pain 12/11/2015   Right flank pain 10/06/2015   Status post bariatric surgery 09/13/2015   History of hypertension 08/30/2015   History of PCOS 08/30/2015   Vitamin D deficiency 04/20/2015   Diabetes mellitus (HCC) 03/27/2015   Dysmenorrhea 05/25/2014   Rectal bleeding 05/25/2014   Contraception 04/07/2014   Dysfunction of both eustachian tubes 04/07/2014   Migraines 04/07/2014   Hypothyroidism 07/22/2013   Major depressive disorder, single episode 07/22/2013   HTN (hypertension) 07/15/2013   Headache 07/10/2013   Anxiety state 03/05/2013   Morbid obesity with BMI of 50.0-59.9, adult (HCC) 03/05/2013   Preventative health care 03/05/2013   Morbid obesity (HCC) 03/05/2013   History of diabetes mellitus, type II 03/04/2013   Diabetes mellitus type 2, uncontrolled 03/04/2013   Obstructive sleep apnea 09/14/2012   Hyperlipidemia 07/28/2012          Physical Exam   Triage Vital Signs: ED Triage Vitals  Encounter Vitals Group     BP 02/24/24 0835 (!) 150/97     Girls Systolic BP Percentile --      Girls Diastolic BP Percentile --      Boys Systolic BP Percentile --      Boys Diastolic BP Percentile --      Pulse Rate 02/24/24 0835 60     Resp 02/24/24 0835 16     Temp 02/24/24 0835 98 F (  36.7 C)     Temp Source 02/24/24 0835 Oral     SpO2 02/24/24 0835 100 %     Weight 02/24/24 0836 255 lb (115.7 kg)     Height 02/24/24 0836 6' 1 (1.854 m)     Head Circumference --      Peak Flow --      Pain Score 02/24/24 0836 7     Pain Loc --      Pain Education --      Exclude from Growth Chart --     Most recent vital signs: Vitals:   02/24/24 1004 02/24/24 1227  BP: 135/67 130/68  Pulse: 72 70  Resp: 18 18  Temp:    SpO2: 100% 100%    Physical Exam Vitals and nursing note reviewed.   Constitutional:      General: Awake and alert. No acute distress.    Appearance: Normal appearance.  HENT:     Head: Normocephalic and atraumatic.     Mouth: Mucous membranes are moist.  Eyes:     General: PERRL. Normal EOMs        Right eye: No discharge.        Left eye: No discharge.     Conjunctiva/sclera: Conjunctivae normal.  Cardiovascular:     Rate and Rhythm: Normal rate and regular rhythm.     Pulses: Normal pulses.  Pulmonary:     Effort: Pulmonary effort is normal. No respiratory distress.     Breath sounds: Normal breath sounds.  Abdominal:     Abdomen is soft. There is mild right lower quadrant abdominal tenderness. No rebound or guarding. No distention. Musculoskeletal:        General: No swelling. Normal range of motion.     Cervical back: Normal range of motion and neck supple.  Skin:    General: Skin is warm and dry.     Capillary Refill: Capillary refill takes less than 2 seconds.     Findings: No rash.  Neurological:     Mental Status: The patient is awake and alert.      ED Results / Procedures / Treatments   Labs (all labs ordered are listed, but only abnormal results are displayed) Labs Reviewed  COMPREHENSIVE METABOLIC PANEL WITH GFR - Abnormal; Notable for the following components:      Result Value   Creatinine, Ser 0.42 (*)    Calcium 8.1 (*)    All other components within normal limits  CBC - Abnormal; Notable for the following components:   RBC 3.46 (*)    Hemoglobin 10.6 (*)    HCT 32.2 (*)    All other components within normal limits  URINALYSIS, ROUTINE W REFLEX MICROSCOPIC - Abnormal; Notable for the following components:   Color, Urine STRAW (*)    APPearance CLEAR (*)    Specific Gravity, Urine 1.004 (*)    All other components within normal limits  LIPASE, BLOOD  POC URINE PREG, ED     EKG     RADIOLOGY I independently reviewed and interpreted imaging and agree with radiologists  findings.     PROCEDURES:  Critical Care performed:   Procedures   MEDICATIONS ORDERED IN ED: Medications  ondansetron  (ZOFRAN ) injection 4 mg (4 mg Intravenous Given 02/24/24 1032)  morphine  (PF) 4 MG/ML injection 4 mg (4 mg Intravenous Given 02/24/24 1032)  iohexol  (OMNIPAQUE ) 300 MG/ML solution 100 mL (100 mLs Intravenous Contrast Given 02/24/24 1046)     IMPRESSION / MDM / ASSESSMENT  AND PLAN / ED COURSE  I reviewed the triage vital signs and the nursing notes.   Differential diagnosis includes, but is not limited to, small bowel obstruction, partial bowel obstruction, marginal ulcer, gastroenteritis.  I reviewed the patient's chart.  Patient was seen in the emergency department on 02/20/2024 at which time she had CT scan of the chest, abdomen, and pelvis which were all reassuring.  She subsequently saw her cardiologist 2 days later.  Patient has been seen in the emergency department multiple times with the same complaint.  Patient is awake and alert, hemodynamically stable and afebrile.  She is nontoxic in appearance.  Labs obtained in triage are overall reassuring.  Recommended symptomatic management given her recent normal workup, however patient is concerned that she is developing a bowel obstruction or a problem with her previous surgeries and would like a CT scan despite her normal CT scan.  Given her multiple surgeries and change in symptoms, we will go ahead and order CT scan.  We discussed the risks of radiation for multiple CT scans, patient understands and would still like to proceed with CT scan.  CT scan is reassuring, no new findings today.  Discussed the option of obtaining stool sample, however patient was unable to provide a stool sample in the emergency department today.  She was treated symptomatically with good effect.  We discussed return precautions and outpatient follow-up.  I recommended that she follow-up with her gastroenterologist which patient reports  that she has, she agrees to do so.  She is able to tolerate p.o. and she feels comfortable going home.  We discussed return precautions in the meantime.  Patient understands and agrees with plan.  She was discharged in stable condition.  Patient's presentation is most consistent with acute complicated illness / injury requiring diagnostic workup.   Clinical Course as of 02/24/24 1443  Wed Feb 24, 2024  1207 Patient reports that she feels improved [JP]    Clinical Course User Index [JP] Alesha Jaffee E, PA-C     FINAL CLINICAL IMPRESSION(S) / ED DIAGNOSES   Final diagnoses:  Nausea vomiting and diarrhea     Rx / DC Orders   ED Discharge Orders     None        Note:  This document was prepared using Dragon voice recognition software and may include unintentional dictation errors.   Rally Ouch E, PA-C 02/24/24 1443    Dicky Anes, MD 02/24/24 1556

## 2024-02-24 NOTE — Discharge Instructions (Signed)
 Please follow-up with your gastroenterologist.  Please return for any new, worsening, or changing symptoms or other concerns.  It was a pleasure caring for you today.

## 2024-02-24 NOTE — ED Triage Notes (Signed)
 Patient being seen for N/V/D. Seen for same on 10/18

## 2024-03-16 ENCOUNTER — Other Ambulatory Visit: Payer: Self-pay

## 2024-03-16 ENCOUNTER — Emergency Department
Admission: EM | Admit: 2024-03-16 | Discharge: 2024-03-16 | Disposition: A | Discharge status: Home / Self Care | Source: Home / Self Care | Attending: Emergency Medicine | Admitting: Emergency Medicine

## 2024-03-16 DIAGNOSIS — K565 Intestinal adhesions [bands], unspecified as to partial versus complete obstruction: Secondary | ICD-10-CM | POA: Diagnosis not present

## 2024-03-16 DIAGNOSIS — R1031 Right lower quadrant pain: Secondary | ICD-10-CM | POA: Insufficient documentation

## 2024-03-16 DIAGNOSIS — E119 Type 2 diabetes mellitus without complications: Secondary | ICD-10-CM | POA: Insufficient documentation

## 2024-03-16 DIAGNOSIS — R112 Nausea with vomiting, unspecified: Secondary | ICD-10-CM | POA: Diagnosis not present

## 2024-03-16 DIAGNOSIS — D649 Anemia, unspecified: Secondary | ICD-10-CM | POA: Insufficient documentation

## 2024-03-16 DIAGNOSIS — I10 Essential (primary) hypertension: Secondary | ICD-10-CM | POA: Insufficient documentation

## 2024-03-16 LAB — POC URINE PREG, ED: Preg Test, Ur: NEGATIVE

## 2024-03-16 LAB — COMPREHENSIVE METABOLIC PANEL WITH GFR
ALT: 23 U/L (ref 0–44)
AST: 31 U/L (ref 15–41)
Albumin: 4.3 g/dL (ref 3.5–5.0)
Alkaline Phosphatase: 133 U/L — ABNORMAL HIGH (ref 38–126)
Anion gap: 9 (ref 5–15)
BUN: 12 mg/dL (ref 6–20)
CO2: 22 mmol/L (ref 22–32)
Calcium: 8.4 mg/dL — ABNORMAL LOW (ref 8.9–10.3)
Chloride: 109 mmol/L (ref 98–111)
Creatinine, Ser: 0.58 mg/dL (ref 0.44–1.00)
GFR, Estimated: >60 mL/min (ref >60)
Glucose, Bld: 80 mg/dL (ref 70–99)
Potassium: 3.6 mmol/L (ref 3.5–5.1)
Sodium: 139 mmol/L (ref 135–145)
Total Bilirubin: 0.5 mg/dL (ref 0.0–1.2)
Total Protein: 6.9 g/dL (ref 6.5–8.1)

## 2024-03-16 LAB — URINALYSIS, ROUTINE W REFLEX MICROSCOPIC
Bilirubin Urine: NEGATIVE
Glucose, UA: NEGATIVE mg/dL
Hgb urine dipstick: NEGATIVE
Ketones, ur: NEGATIVE mg/dL
Leukocytes,Ua: NEGATIVE
Nitrite: NEGATIVE
Protein, ur: NEGATIVE mg/dL
Specific Gravity, Urine: 1.005 (ref 1.005–1.030)
pH: 6 (ref 5.0–8.0)

## 2024-03-16 LAB — CBC
HCT: 32.1 % — ABNORMAL LOW (ref 36.0–46.0)
Hemoglobin: 10.4 g/dL — ABNORMAL LOW (ref 12.0–15.0)
MCH: 30.2 pg (ref 26.0–34.0)
MCHC: 32.4 g/dL (ref 30.0–36.0)
MCV: 93.3 fL (ref 80.0–100.0)
Platelets: 340 K/uL (ref 150–400)
RBC: 3.44 MIL/uL — ABNORMAL LOW (ref 3.87–5.11)
RDW: 14.3 % (ref 11.5–15.5)
WBC: 6 K/uL (ref 4.0–10.5)
nRBC: 0 % (ref 0.0–0.2)

## 2024-03-16 LAB — LIPASE, BLOOD: Lipase: 13 U/L (ref 11–51)

## 2024-03-16 MED ORDER — OXYCODONE-ACETAMINOPHEN 5-325 MG PO TABS: 1.0000 | ORAL_TABLET | Freq: Four times a day (QID) | ORAL | 0 refills | Status: DC | PRN

## 2024-03-16 NOTE — ED Triage Notes (Signed)
 Patient states right lower abdominal pain since this morning. She states she sees a pain clinic for same and has nerve block scheduled for January.

## 2024-03-16 NOTE — ED Notes (Signed)
 Pt given Dc instructions. Pt verbalized understanding of medication and follow up care. Pt ambulatory from ED without difficulty.

## 2024-03-16 NOTE — ED Provider Notes (Signed)
 [facility] Provider Note   Event Date/Time   First MD Initiated Contact with Patient 03/16/24 1230     (approximate) History  Abdominal Pain  HPI Maria Fuller is a 38 y.o. female with a past medical history of PCOS, type 2 diabetes, hypertension, hypercholesterolemia, and chronic abdominal pain who presents complaining of right lower quadrant abdominal pain that has been worsening overnight.  Patient states that she began having some abdominal distention and then right and left lower quadrants last night however prior to going to bed she did not feel the distention anymore and only felt significant pain.  Of note, patient has abdominal pain daily however this is much worse from her baseline.  Patient is currently seeing a pain clinic and has a plan for a nerve ablation in January.  Patient states that she called her pain clinic and was told to come to the emergency department for her acute pain.  Patient denies any hematochezia, recent travel, food out of the ordinary, or sick contacts. ROS: Patient currently denies any vision changes, tinnitus, difficulty speaking, facial droop, sore throat, chest pain, shortness of breath, nausea/vomiting/diarrhea, dysuria, or weakness/numbness/paresthesias in any extremity   Physical Exam  Triage Vital Signs: ED Triage Vitals [03/16/24 1125]  Encounter Vitals Group     BP (!) 146/83     Girls Systolic BP Percentile      Girls Diastolic BP Percentile      Boys Systolic BP Percentile      Boys Diastolic BP Percentile      Pulse Rate (!) 58     Resp 18     Temp 98.4 F (36.9 C)     Temp Source Oral     SpO2 100 %     Weight 250 lb (113.4 kg)     Height 6' 1 (1.854 m)     Head Circumference      Peak Flow      Pain Score 8     Pain Loc      Pain Education      Exclude from Growth Chart    Most recent vital signs: Vitals:   03/16/24 1125 03/16/24 1235  BP: (!) 146/83   Pulse: (!) 58   Resp: 18   Temp: 98.4 F  (36.9 C)   SpO2: 100% 100%   General: Awake, oriented x4. CV:  Good peripheral perfusion. Resp:  Normal effort. Abd:  No distention.  Right lower quadrant tenderness to palpation Other:  Middle-aged obese Caucasian female resting comfortably in no acute distress ED Results / Procedures / Treatments  Labs (all labs ordered are listed, but only abnormal results are displayed) Labs Reviewed  COMPREHENSIVE METABOLIC PANEL WITH GFR - Abnormal; Notable for the following components:      Result Value   Calcium 8.4 (*)    Alkaline Phosphatase 133 (*)    All other components within normal limits  CBC - Abnormal; Notable for the following components:   RBC 3.44 (*)    Hemoglobin 10.4 (*)    HCT 32.1 (*)    All other components within normal limits  URINALYSIS, ROUTINE W REFLEX MICROSCOPIC - Abnormal; Notable for the following components:   Color, Urine STRAW (*)    APPearance CLEAR (*)    All other components within normal limits  LIPASE, BLOOD  POC URINE PREG, ED   PROCEDURES: Critical Care performed: No Procedures MEDICATIONS ORDERED IN ED: Medications - No data to display IMPRESSION / MDM /  ASSESSMENT AND PLAN / ED COURSE  I reviewed the triage vital signs and the nursing notes.                             The patient is on the cardiac monitor to evaluate for evidence of arrhythmia and/or significant heart rate changes. Patient's presentation is most consistent with acute presentation with potential threat to life or bodily function. Patient a 38 year old female with the above-stated past medical history including chronic abdominal pain seeing a pain clinic and not on a pain contract who presents complaining of right lower quadrant abdominal pain that has been worsening overnight. DDx: Small bowel obstruction, appendicitis, mesenteric ischemia, food poisoning Plan: CBC, CMP, urinalysis, lipase, urine pregnancy  Laboratory evaluation/any evidence of acute abnormality.  Patient  has stable anemia 10.4.  Given increase in patient's pain, will provide a 3-day course of oral opiate medications prior to following up with her pain clinic.  Patient agrees with plan for discharge at this time with prescription analgesia and outpatient pain follow-up.  Patient given strict return precautions and all questions answered prior to discharge.  Dispo: Discharge home with pain clinic follow-up   FINAL CLINICAL IMPRESSION(S) / ED DIAGNOSES   Final diagnoses:  Right lower quadrant abdominal pain   Rx / DC Orders   ED Discharge Orders          Ordered    oxyCODONE -acetaminophen  (PERCOCET) 5-325 MG tablet  Every 6 hours PRN        03/16/24 1249           Note:  This document was prepared using Dragon voice recognition software and may include unintentional dictation errors.   Shai Mckenzie K, MD 03/16/24 1255

## 2024-03-19 ENCOUNTER — Inpatient Hospital Stay
Admission: EM | Admit: 2024-03-19 | Discharge: 2024-03-21 | DRG: 390 | Disposition: A | Discharge status: Home / Self Care | Attending: Internal Medicine | Admitting: Internal Medicine

## 2024-03-19 ENCOUNTER — Other Ambulatory Visit: Payer: Self-pay

## 2024-03-19 ENCOUNTER — Emergency Department

## 2024-03-19 ENCOUNTER — Encounter: Payer: Self-pay | Admitting: Emergency Medicine

## 2024-03-19 DIAGNOSIS — Z888 Allergy status to other drugs, medicaments and biological substances status: Secondary | ICD-10-CM

## 2024-03-19 DIAGNOSIS — Z9884 Bariatric surgery status: Secondary | ICD-10-CM

## 2024-03-19 DIAGNOSIS — E7800 Pure hypercholesterolemia, unspecified: Secondary | ICD-10-CM | POA: Diagnosis present

## 2024-03-19 DIAGNOSIS — K565 Intestinal adhesions [bands], unspecified as to partial versus complete obstruction: Principal | ICD-10-CM | POA: Diagnosis present

## 2024-03-19 DIAGNOSIS — Z6835 Body mass index (BMI) 35.0-35.9, adult: Secondary | ICD-10-CM | POA: Diagnosis not present

## 2024-03-19 DIAGNOSIS — G4733 Obstructive sleep apnea (adult) (pediatric): Secondary | ICD-10-CM | POA: Diagnosis present

## 2024-03-19 DIAGNOSIS — F32A Depression, unspecified: Secondary | ICD-10-CM | POA: Diagnosis present

## 2024-03-19 DIAGNOSIS — F419 Anxiety disorder, unspecified: Secondary | ICD-10-CM | POA: Diagnosis present

## 2024-03-19 DIAGNOSIS — R5383 Other fatigue: Secondary | ICD-10-CM | POA: Diagnosis present

## 2024-03-19 DIAGNOSIS — Z880 Allergy status to penicillin: Secondary | ICD-10-CM | POA: Diagnosis not present

## 2024-03-19 DIAGNOSIS — G8929 Other chronic pain: Secondary | ICD-10-CM | POA: Diagnosis present

## 2024-03-19 DIAGNOSIS — Z975 Presence of (intrauterine) contraceptive device: Secondary | ICD-10-CM | POA: Diagnosis not present

## 2024-03-19 DIAGNOSIS — Z8661 Personal history of infections of the central nervous system: Secondary | ICD-10-CM | POA: Diagnosis not present

## 2024-03-19 DIAGNOSIS — D649 Anemia, unspecified: Secondary | ICD-10-CM | POA: Diagnosis present

## 2024-03-19 DIAGNOSIS — Z881 Allergy status to other antibiotic agents status: Secondary | ICD-10-CM | POA: Diagnosis not present

## 2024-03-19 DIAGNOSIS — E66811 Obesity, class 1: Secondary | ICD-10-CM | POA: Diagnosis present

## 2024-03-19 DIAGNOSIS — K219 Gastro-esophageal reflux disease without esophagitis: Secondary | ICD-10-CM | POA: Diagnosis present

## 2024-03-19 DIAGNOSIS — Z833 Family history of diabetes mellitus: Secondary | ICD-10-CM | POA: Diagnosis not present

## 2024-03-19 DIAGNOSIS — Z59868 Other specified financial insecurity: Secondary | ICD-10-CM | POA: Diagnosis not present

## 2024-03-19 DIAGNOSIS — Z87442 Personal history of urinary calculi: Secondary | ICD-10-CM

## 2024-03-19 DIAGNOSIS — E669 Obesity, unspecified: Secondary | ICD-10-CM | POA: Diagnosis not present

## 2024-03-19 DIAGNOSIS — Z9079 Acquired absence of other genital organ(s): Secondary | ICD-10-CM

## 2024-03-19 DIAGNOSIS — K56609 Unspecified intestinal obstruction, unspecified as to partial versus complete obstruction: Secondary | ICD-10-CM | POA: Diagnosis not present

## 2024-03-19 DIAGNOSIS — Z9049 Acquired absence of other specified parts of digestive tract: Secondary | ICD-10-CM

## 2024-03-19 DIAGNOSIS — Z79899 Other long term (current) drug therapy: Secondary | ICD-10-CM

## 2024-03-19 DIAGNOSIS — Z90721 Acquired absence of ovaries, unilateral: Secondary | ICD-10-CM

## 2024-03-19 DIAGNOSIS — I10 Essential (primary) hypertension: Secondary | ICD-10-CM | POA: Diagnosis present

## 2024-03-19 DIAGNOSIS — Z5941 Food insecurity: Secondary | ICD-10-CM

## 2024-03-19 DIAGNOSIS — Z818 Family history of other mental and behavioral disorders: Secondary | ICD-10-CM

## 2024-03-19 DIAGNOSIS — F418 Other specified anxiety disorders: Secondary | ICD-10-CM | POA: Diagnosis not present

## 2024-03-19 DIAGNOSIS — E119 Type 2 diabetes mellitus without complications: Secondary | ICD-10-CM | POA: Diagnosis present

## 2024-03-19 DIAGNOSIS — E039 Hypothyroidism, unspecified: Secondary | ICD-10-CM | POA: Diagnosis present

## 2024-03-19 DIAGNOSIS — R112 Nausea with vomiting, unspecified: Secondary | ICD-10-CM | POA: Diagnosis present

## 2024-03-19 LAB — COMPREHENSIVE METABOLIC PANEL WITH GFR
ALT: 23 U/L (ref 0–44)
AST: 23 U/L (ref 15–41)
Albumin: 4.2 g/dL (ref 3.5–5.0)
Alkaline Phosphatase: 121 U/L (ref 38–126)
Anion gap: 8 (ref 5–15)
BUN: 11 mg/dL (ref 6–20)
CO2: 24 mmol/L (ref 22–32)
Calcium: 8.3 mg/dL — ABNORMAL LOW (ref 8.9–10.3)
Chloride: 109 mmol/L (ref 98–111)
Creatinine, Ser: 0.59 mg/dL (ref 0.44–1.00)
GFR, Estimated: >60 mL/min (ref >60)
Glucose, Bld: 85 mg/dL (ref 70–99)
Potassium: 4 mmol/L (ref 3.5–5.1)
Sodium: 141 mmol/L (ref 135–145)
Total Bilirubin: 0.5 mg/dL (ref 0.0–1.2)
Total Protein: 6.5 g/dL (ref 6.5–8.1)

## 2024-03-19 LAB — URINALYSIS, ROUTINE W REFLEX MICROSCOPIC
Bilirubin Urine: NEGATIVE
Glucose, UA: NEGATIVE mg/dL
Hgb urine dipstick: NEGATIVE
Ketones, ur: NEGATIVE mg/dL
Leukocytes,Ua: NEGATIVE
Nitrite: NEGATIVE
Protein, ur: NEGATIVE mg/dL
Specific Gravity, Urine: 1.019 (ref 1.005–1.030)
pH: 5 (ref 5.0–8.0)

## 2024-03-19 LAB — CBC
HCT: 30.1 % — ABNORMAL LOW (ref 36.0–46.0)
Hemoglobin: 9.9 g/dL — ABNORMAL LOW (ref 12.0–15.0)
MCH: 30.2 pg (ref 26.0–34.0)
MCHC: 32.9 g/dL (ref 30.0–36.0)
MCV: 91.8 fL (ref 80.0–100.0)
Platelets: 323 K/uL (ref 150–400)
RBC: 3.28 MIL/uL — ABNORMAL LOW (ref 3.87–5.11)
RDW: 14.1 % (ref 11.5–15.5)
WBC: 5.5 K/uL (ref 4.0–10.5)
nRBC: 0 % (ref 0.0–0.2)

## 2024-03-19 LAB — POC URINE PREG, ED: Preg Test, Ur: NEGATIVE

## 2024-03-19 LAB — LIPASE, BLOOD: Lipase: 15 U/L (ref 11–51)

## 2024-03-19 MED ORDER — HYDROMORPHONE HCL 1 MG/ML IJ SOLN
1.0000 mg | INTRAMUSCULAR | Status: DC | PRN
  Administered 2024-03-20 – 2024-03-21 (×5): 1 mg via INTRAVENOUS
  Filled 2024-03-19 (×5): qty 1

## 2024-03-19 MED ORDER — HYDRALAZINE HCL 20 MG/ML IJ SOLN: 5.0000 mg | INTRAMUSCULAR | Status: DC | PRN

## 2024-03-19 MED ORDER — ONDANSETRON HCL 4 MG/2ML IJ SOLN
4.0000 mg | Freq: Three times a day (TID) | INTRAMUSCULAR | Status: DC | PRN
  Administered 2024-03-20 (×2): 4 mg via INTRAVENOUS
  Filled 2024-03-19 (×2): qty 2

## 2024-03-19 MED ORDER — OXYCODONE-ACETAMINOPHEN 5-325 MG PO TABS
1.0000 | ORAL_TABLET | ORAL | Status: DC | PRN
  Administered 2024-03-20 (×3): 1 via ORAL
  Filled 2024-03-19 (×3): qty 1

## 2024-03-19 MED ORDER — LACTATED RINGERS IV SOLN: INTRAVENOUS | Status: DC

## 2024-03-19 MED ORDER — ONDANSETRON HCL 4 MG/2ML IJ SOLN
4.0000 mg | Freq: Once | INTRAMUSCULAR | Status: AC
  Administered 2024-03-19: 4 mg via INTRAVENOUS
  Filled 2024-03-19: qty 2

## 2024-03-19 MED ORDER — HYDROMORPHONE HCL 1 MG/ML IJ SOLN
0.5000 mg | Freq: Once | INTRAMUSCULAR | Status: AC
  Administered 2024-03-19: 0.5 mg via INTRAVENOUS
  Filled 2024-03-19: qty 0.5

## 2024-03-19 MED ORDER — SODIUM CHLORIDE 0.9 % IV SOLN: INTRAVENOUS | Status: DC

## 2024-03-19 MED ORDER — HYDROMORPHONE HCL 1 MG/ML IJ SOLN
1.0000 mg | Freq: Once | INTRAMUSCULAR | Status: AC
  Administered 2024-03-19: 1 mg via INTRAVENOUS
  Filled 2024-03-19: qty 1

## 2024-03-19 MED ORDER — ACETAMINOPHEN 325 MG PO TABS
650.0000 mg | ORAL_TABLET | Freq: Four times a day (QID) | ORAL | Status: DC | PRN
  Administered 2024-03-21: 650 mg via ORAL
  Filled 2024-03-19: qty 2

## 2024-03-19 MED ORDER — IOHEXOL 300 MG/ML  SOLN
100.0000 mL | Freq: Once | INTRAMUSCULAR | Status: AC | PRN
  Administered 2024-03-19: 100 mL via INTRAVENOUS

## 2024-03-19 MED ORDER — LACTATED RINGERS IV BOLUS
1000.0000 mL | Freq: Once | INTRAVENOUS | Status: AC
  Administered 2024-03-19: 1000 mL via INTRAVENOUS

## 2024-03-19 NOTE — H&P (Incomplete)
 History and Physical    Maria Fuller FMW:[id number] DOB: Sep 09, 1985 DOA: 03/19/2024  Referring MD/NP/PA:   PCP: Perri Constance Sor, PA-C   Patient coming from:  The patient is coming from home.     Chief Complaint: nausea, vomiting, abdominal pain  HPI: Maria Fuller is a 38 y.o. female with medical history significant of gastric bypass surgery, incarcerated hernia, multiple small bowel obstruction, chronic abdominal pain, depression with anxiety, GERD, hypothyroidism, anemia, obesity, resolved hypertension, diabetes and OSA after gastric bypass surgery, who presents with nausea, vomiting, abdominal pain.  Patient states that her symptoms started this morning.  She has had 4 times of nonbilious nonbloody vomiting.  Her abdominal pain is located in the central abdomen, constant, aching, 6 out of 10 in severity, radiating to the back and groin areas, not aggravated or alleviated by any known factors.  No fever or chills.  Her last bowel movement was 2 days ago.  No chest pain, cough, SOB.  No symptoms of UTI.  No fever or chills.  Data reviewed independently and ED Course: pt was found to have WBC 5.5, GFR> 60, negative UA, negative pregnancy test.  Temperature normal, blood pressure 148/82, heart rate 58, RR 18, oxygen saturation 98% on room air.  Patient is admitted to MedSurg bed as inpatient.  Epic message is sent to Dr. Cesar of surgery for consult at 6:00AM.   29F, hx of gastric bypass surgery, incarcerated hernia, multiple SBO, obesity, presents with recurrent small bowel obstruction, placed NG tube.  CT of abdomen/pelvis: 1. Findings consistent with distal small bowel obstruction with a discrete transition point in the right anterior pelvis, likely related to adhesions. There is increased stool dilatation of postsurgical and nonsurgical small bowel segments in the right lower quadrant. The upstream small bowel is not dilated. 2. Mild ascending colonic wall thickening  versus underdistention; consider colitis as a differential. 3. Moderate retained stool in the transverse colon. 4. Trace perihepatic and pelvic free fluid, likely reactive to bowel pathology. 5. Nonacute chronic findings discussed above.    EKG:  Not done in ED, will get one.    Review of Systems:   General: no fevers, chills, no body weight gain, has poor appetite, has fatigue HEENT: no blurry vision, hearing changes or sore throat Respiratory: no dyspnea, coughing, wheezing CV: no chest pain, no palpitations GI: has nausea, vomiting, abdominal pain, no  diarrhea, constipation GU: no dysuria, burning on urination, increased urinary frequency, hematuria  Ext: no leg edema Neuro: no unilateral weakness, numbness, or tingling, no vision change or hearing loss Skin: no rash, no skin tear. MSK: No muscle spasm, no deformity, no limitation of range of movement in spin Heme: No easy bruising.  Travel history: No recent long distant travel.   Allergy:  Allergies  Allergen Reactions   Cefuroxime Dermatitis   Ceftin [Cefuroxime Axetil] Rash    Tolerated cefazolin  before   Penicillins Rash and Other (See Comments)    TOLERATED CEFAZOLIN  BEFORE   Prochlorperazine  Other (See Comments) and Anxiety    Panic attack    Past Medical History:  Diagnosis Date   Anemia    slight   Bradycardia    Depression    Diabetes mellitus without complication (HCC)    resolved with weight loss from gastric bypass   Headache    migraines. resolved since gastric bypass   High cholesterol    resolved since weight loss surgery   History of kidney stones    last week  Hypertension    resolved since gastric bypass, severe pre-eclampsia with G1   Incarcerated incisional hernia 07/27/2021   Low BP    Meningitis 2015   Partial small bowel obstruction (HCC) 08/04/2019   PCOS (polycystic ovarian syndrome)    Preterm labor    Recurrent ventral incisional hernia    Sleep apnea    had gastric bypass  surgery and now resolved   Small bowel obstruction (HCC)    Syncope    Vertigo couple years ago    Past Surgical History:  Procedure Laterality Date   BIOPSY  11/26/2022   Procedure: BIOPSY;  Surgeon: Unk Corinn Skiff, MD;  Location: [facility];  Service: Gastroenterology;;   CESAREAN SECTION     CESAREAN SECTION N/A 05/07/2016   Procedure: CESAREAN SECTION;  Surgeon: Lamar SHAUNNA Lesches, MD;  Location: [facility];  Service: Obstetrics;  Laterality: N/A;  Time of birth: 10[birth year] Sex: Female Weight: 3940 kg, 8 lb 11 oz   CHOLECYSTECTOMY     COLON SURGERY     COLONOSCOPY WITH PROPOFOL  N/A 03/16/2023   Procedure: COLONOSCOPY WITH PROPOFOL ;  Surgeon: Maryruth Ole DASEN, MD;  Location: [facility];  Service: Endoscopy;  Laterality: N/A;   CYSTOSCOPY WITH STENT PLACEMENT Right 09/19/2016   Procedure: CYSTOSCOPY WITH STENT PLACEMENT;  Surgeon: Chauncey Redell Agent, MD;  Location: [facility];  Service: Urology;  Laterality: Right;   CYSTOSCOPY WITH STENT PLACEMENT Left 04/18/2022   Procedure: CYSTOSCOPY WITH STENT PLACEMENT;  Surgeon: Watt Rush, MD;  Location: [facility];  Service: Urology;  Laterality: Left;   CYSTOSCOPY/URETEROSCOPY/HOLMIUM LASER/STENT PLACEMENT Left 05/08/2022   Procedure: CYSTOSCOPY/URETEROSCOPY/HOLMIUM LASER/STENT EXCHANGE;  Surgeon: Penne Knee, MD;  Location: [facility];  Service: Urology;  Laterality: Left;   DIAGNOSTIC LAPAROSCOPY     ESOPHAGOGASTRODUODENOSCOPY (EGD) WITH PROPOFOL  N/A 11/23/2022   Procedure: ESOPHAGOGASTRODUODENOSCOPY (EGD) WITH PROPOFOL ;  Surgeon: Therisa Bi, MD;  Location: [facility];  Service: Gastroenterology;  Laterality: N/A;   ESOPHAGOGASTRODUODENOSCOPY (EGD) WITH PROPOFOL  N/A 11/26/2022   Procedure: ESOPHAGOGASTRODUODENOSCOPY (EGD) WITH PROPOFOL ;  Surgeon: Unk Corinn Skiff, MD;  Location: [facility];  Service: Gastroenterology;  Laterality: N/A;   EXTRACORPOREAL SHOCK WAVE LITHOTRIPSY Left 05/15/2022   Procedure: EXTRACORPOREAL  SHOCK WAVE LITHOTRIPSY (ESWL);  Surgeon: Penne Knee, MD;  Location: [facility];  Service: Urology;  Laterality: Left;   FLEXIBLE SIGMOIDOSCOPY N/A 07/28/2019   Procedure: FLEXIBLE SIGMOIDOSCOPY;  Surgeon: Unk Corinn Skiff, MD;  Location: [facility];  Service: Endoscopy;  Laterality: N/A;   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   HERNIA REPAIR     INSERTION OF MESH N/A 07/07/2022   Procedure: INSERTION OF MESH;  Surgeon: Lane Shope, MD;  Location: [facility];  Service: General;  Laterality: N/A;   LAPAROSCOPIC OVARIAN CYSTECTOMY Right 09/28/2018   Procedure: LAPAROSCOPIC RIGHT OVARIAN CYSTECTOMY;  Surgeon: Lesches Lamar SHAUNNA, MD;  Location: [facility];  Service: Gynecology;  Laterality: Right;   LAPAROSCOPIC UNILATERAL SALPINGO OOPHERECTOMY Right 07/25/2021   Procedure: LAPAROSCOPIC UNILATERAL SALPINGO OOPHORECTOMY;  Surgeon: Lesches Lamar SHAUNNA, MD;  Location: [facility];  Service: Gynecology;  Laterality: Right;   LEFT HEART CATH AND CORONARY ANGIOGRAPHY Left 04/07/2018   Procedure: LEFT HEART CATH AND CORONARY ANGIOGRAPHY;  Surgeon: Florencio Cara BIRCH, MD;  Location: [facility];  Service: Cardiovascular;  Laterality: Left;   OOPHORECTOMY     POLYPECTOMY  03/16/2023   Procedure: POLYPECTOMY;  Surgeon: Maryruth Ole DASEN, MD;  Location: [facility];  Service: Endoscopy;;   small bowel obstruction  07/2019   TONSILECTOMY,  ADENOIDECTOMY, BILATERAL MYRINGOTOMY AND TUBES     TONSILLECTOMY     URETEROSCOPY WITH HOLMIUM LASER LITHOTRIPSY Right 09/19/2016   Procedure: URETEROSCOPY WITH HOLMIUM LASER LITHOTRIPSY;  Surgeon: Chauncey Redell Agent, MD;  Location: [facility];  Service: Urology;  Laterality: Right;   VENTRAL HERNIA REPAIR     scheduled for 07/07/2022   XI ROBOTIC ASSISTED VENTRAL HERNIA N/A 07/27/2021   Procedure: XI ROBOTIC ASSISTED INCARCERATED INCISIONAL HERNIA REPAIR;  Surgeon: Lane Shope, MD;  Location: [facility];  Service: General;  Laterality: N/A;   XI  ROBOTIC ASSISTED VENTRAL HERNIA N/A 07/07/2022   Procedure: XI ROBOTIC ASSISTED VENTRAL HERNIA, recurrent incisional with mesh;  Surgeon: Lane Shope, MD;  Location: [facility];  Service: General;  Laterality: N/A;    Social History:  reports that she has never smoked. She has never been exposed to tobacco smoke. She has never used smokeless tobacco. She reports that she does not drink alcohol and does not use drugs.  Family History:  Family History  Problem Relation Age of Onset   Polycystic ovary syndrome Mother    Diabetes Father    Bladder Cancer Father    Depression Father    Endometriosis Sister    Polycystic ovary syndrome Sister    Breast cancer Paternal Grandmother    Diabetes Paternal Grandmother    Diabetes Paternal Grandfather    Kidney cancer Neg Hx    Prostate cancer Neg Hx      Prior to Admission medications   Medication Sig Start Date End Date Taking? Authorizing Provider  buPROPion  (WELLBUTRIN  XL) 150 MG 24 hr tablet Take 150 mg by mouth every morning. 06/17/21   [provider]  clonazePAM (KLONOPIN) 0.5 MG tablet Take 0.5 mg by mouth daily.    [provider]  dicyclomine  (BENTYL ) 10 MG capsule Take 1 capsule (10 mg total) by mouth 4 (four) times daily -  before meals and at bedtime. 08/10/23   Menshew, Candida LULLA Kings, PA-C  furosemide (LASIX) 20 MG tablet Take 1 tablet (20 mg total) by mouth daily as needed. 02/22/24   Gollan, Timothy J, MD  gabapentin  (NEURONTIN ) 600 MG tablet Take 600 mg by mouth 3 (three) times daily. 02/20/24   [provider]  hydrOXYzine (ATARAX) 25 MG tablet Take 1 tablet by mouth 3 (three) times daily as needed. 12/17/22   [provider]  ketoconazole (NIZORAL) 2 % cream Apply 1 Application topically daily as needed. 02/16/24 02/15/25  [provider]  levonorgestrel  (MIRENA ) 20 MCG/24HR IUD 1 each by Intrauterine route once.    [provider]  methocarbamol (ROBAXIN) 500 MG tablet Take  500 mg by mouth 3 (three) times daily.    [provider]  nystatin (MYCOSTATIN/NYSTOP) powder Apply 1 Application topically as needed. 02/12/24 02/11/25  [provider]  ondansetron  (ZOFRAN -ODT) 4 MG disintegrating tablet Take 1 tablet (4 mg total) by mouth every 8 (eight) hours as needed for nausea or vomiting. 08/10/23   Menshew, Candida LULLA Kings, PA-C  ondansetron  (ZOFRAN -ODT) 4 MG disintegrating tablet Take 1 tablet (4 mg total) by mouth every 8 (eight) hours as needed. 09/12/23   Margrette, Myah A, PA-C  oxyCODONE -acetaminophen  (PERCOCET) 5-325 MG tablet Take 1 tablet by mouth every 6 (six) hours as needed for severe pain (pain score 7-10). 03/16/24   Bradler, Evan K, MD  pantoprazole  (PROTONIX ) 40 MG tablet Take 1 tablet (40 mg total) by mouth 2 (two) times daily before a meal. 11/26/22   Marsa Edelman, DO  polyethylene glycol powder (GLYCOLAX /MIRALAX ) 17 GM/SCOOP powder Take 17 g by mouth daily. 06/23/23   Dorothyann Drivers, MD  potassium chloride  SA (KLOR-CON  M) 20 MEQ tablet Take 1 tablet (20 mEq total) by mouth daily as needed. 02/22/24   Gollan, Timothy J, MD  promethazine  (PHENERGAN ) 25 MG suppository Place 1 suppository (25 mg total) rectally every 6 (six) hours as needed for nausea or vomiting. 11/17/23   Bradler, Evan K, MD  scopolamine  (TRANSDERM-SCOP) 1 MG/3DAYS 1 patch every three (3) days as needed. 08/11/22   [provider]  sertraline  (ZOLOFT ) 100 MG tablet Take 100 mg by mouth 2 (two) times daily.    [provider]  sucralfate (CARAFATE) 1 GM/10ML suspension Take 10 mLs (1 g total) by mouth 3 (three) times daily as needed. 02/20/24 02/19/25  Triplett, Kirk B, FNP  traZODone  (DESYREL ) 50 MG tablet Take 50 mg by mouth at bedtime.  09/06/18   [provider]    Physical Exam: Vitals:   03/19/24 2001 03/19/24 2002 03/19/24 2354 03/20/24 0030  BP:  (!) 148/82  128/84  Pulse:  (!) 58  (!) 55  Resp:  18  16  Temp:  98.1 F (36.7 C) 97.9  F (36.6 C) 98.4 F (36.9 C)  TempSrc:  Oral Oral Oral  SpO2:  98%  98%  Weight: 113.4 kg     Height: 6' 1 (1.854 m)      General: Not in acute distress HEENT:       Eyes: PERRL, EOMI, no jaundice       ENT: No discharge from the ears and nose, no pharynx injection, no tonsillar enlargement.        Neck: No JVD, no bruit, no mass felt. Heme: No neck lymph node enlargement. Cardiac: S1/S2, RRR, No murmurs, No gallops or rubs. Respiratory: No rales, wheezing, rhonchi or rubs. GI: Soft, nondistended, has central and lower abdominal tenderness, no rebound pain, no organomegaly, BS present. GU: No hematuria Ext: No pitting leg edema bilaterally. 1+DP/PT pulse bilaterally. Musculoskeletal: No joint deformities, No joint redness or warmth, no limitation of ROM in spin. Skin: No rashes.  Neuro: Alert, oriented X3, cranial nerves II-XII grossly intact, moves all extremities normally.  Psych: Patient is not psychotic, no suicidal or hemocidal ideation.  Labs on Admission: I have personally reviewed following labs and imaging studies  CBC: Recent Labs  Lab 03/16/24 1127 03/19/24 2003  WBC 6.0 5.5  HGB 10.4* 9.9*  HCT 32.1* 30.1*  MCV 93.3 91.8  PLT 340 323   Basic Metabolic Panel: Recent Labs  Lab 03/16/24 1127 03/19/24 2003  NA 139 141  K 3.6 4.0  CL 109 109  CO2 22 24  GLUCOSE 80 85  BUN 12 11  CREATININE 0.58 0.59  CALCIUM 8.4* 8.3*   GFR: Estimated Creatinine Clearance: 135 mL/min (by C-G formula based on SCr of 0.59 mg/dL). Liver Function Tests: Recent Labs  Lab 03/16/24 1127 03/19/24 2003  AST 31 23  ALT 23 23  ALKPHOS 133* 121  BILITOT 0.5 0.5  PROT 6.9 6.5  ALBUMIN 4.3 4.2   Recent Labs  Lab 03/16/24 1127 03/19/24 2003  LIPASE 13 15   No results for input(s): AMMONIA in the last 168 hours. Coagulation Profile: No results for input(s): INR, PROTIME in the last 168 hours. Cardiac Enzymes: No results for input(s): CKTOTAL, CKMB,  CKMBINDEX, TROPONINI in the last 168 hours. BNP (last 3 results) No results for input(s): PROBNP in the last 8760 hours.  HbA1C: No results for input(s): HGBA1C in the last 72 hours. CBG: No results for input(s): GLUCAP in the last 168 hours. Lipid Profile: No results for input(s): CHOL, HDL, LDLCALC, TRIG, CHOLHDL, LDLDIRECT in the last 72 hours. Thyroid  Function Tests: No results for input(s): TSH, T4TOTAL, FREET4, T3FREE, THYROIDAB in the last 72 hours. Anemia Panel: No results for input(s): VITAMINB12, FOLATE, FERRITIN, TIBC, IRON, RETICCTPCT in the last 72 hours. Urine analysis:    Component Value Date/Time   COLORURINE YELLOW (A) 03/19/2024 2003   APPEARANCEUR CLEAR (A) 03/19/2024 2003   APPEARANCEUR Clear 05/29/2022 1503   LABSPEC 1.019 03/19/2024 2003   LABSPEC 1.033 02/08/2013 0027   PHURINE 5.0 03/19/2024 2003   GLUCOSEU NEGATIVE 03/19/2024 2003   GLUCOSEU >=500 02/08/2013 0027   HGBUR NEGATIVE 03/19/2024 2003   BILIRUBINUR NEGATIVE 03/19/2024 2003   BILIRUBINUR Negative 05/29/2022 1503   BILIRUBINUR Negative 02/08/2013 0027   KETONESUR NEGATIVE 03/19/2024 2003   PROTEINUR NEGATIVE 03/19/2024 2003   UROBILINOGEN 1.0 06/26/2014 1405   NITRITE NEGATIVE 03/19/2024 2003   LEUKOCYTESUR NEGATIVE 03/19/2024 2003   LEUKOCYTESUR Negative 02/08/2013 0027   Sepsis Labs: @LABRCNTIP (procalcitonin:4,lacticidven:4) )No results found for this or any previous visit (from the past 240 hours).   Radiological Exams on Admission:   Assessment/Plan Principal Problem:   Small bowel obstruction (HCC) Active Problems:   HTN (hypertension)   Normocytic anemia   Depression with anxiety   Obesity (BMI 30-39.9)   Assessment and Plan:  Small bowel obstruction (HCC): This is a recurrent issue, most likely due to adhesion.  Patient has abdominal tenderness in central and lower abdomen, no guarding or rebound pain.  -Admit to med surg  bed -NPO   - place in NG tube -prn Dilaudid , Percocet, Tylenol  -Prn Zofran  prn nausea   -IVF: 1 L LR, then 75 cc/h -INR/PTT/type & screen -Epic message is sent to Dr. Cesar of surgery for consult at 6:00AM  HTN (hypertension): Blood pressure 148/82.  Patient did not need Bp medications after gastric bypass surgery. - IV hydralazine as needed  Normocytic anemia: Hemoglobin 9.9 (10.4 03/16/2024) -Follow-up by CBC  Depression with anxiety -As needed IV Ativan 0.5 mg every 8h - Continue home Klonopin, hydroxyzine, Wellbutrin , Zoloft , trazodone   Obesity (BMI 30-39.9): S/p of gastric bypass surgery. Patient has Obesity Class I, with body weight 113.4 Kg and BMI 32.98 kg/m2.  - Encourage losing weight - Exercise and healthy diet     DVT ppx: SCD  Code Status: Full code     Family Communication:  Yes, patient's husband at bed side.      Disposition Plan:  Anticipate discharge back to previous environment  Consults called:  Epic message is sent to Dr. Cesar of surgery for consult at 6:00AM  Admission status and Level of care:  as inpt        Dispo: The patient is from: Home              Anticipated d/c is to: Home              Anticipated d/c date is: 2 days              Patient currently is not medically stable to d/c.    Severity of Illness:  The appropriate patient status for this patient is INPATIENT. Inpatient status is judged to be reasonable and necessary in order to provide the required intensity of service to ensure the patient's safety. The patient's presenting symptoms, physical exam findings, and initial  radiographic and laboratory data in the context of their chronic comorbidities is felt to place them at high risk for further clinical deterioration. Furthermore, it is not anticipated that the patient will be medically stable for discharge from the hospital within 2 midnights of admission.   * I certify that at the point of admission it is my clinical  judgment that the patient will require inpatient hospital care spanning beyond 2 midnights from the point of admission due to high intensity of service, high risk for further deterioration and high frequency of surveillance required.*       Date of Service 03/20/2024    [facility]   If 7PM-7AM, please contact night-coverage [email or web address] 03/20/2024, 12:54 AM

## 2024-03-19 NOTE — ED Triage Notes (Signed)
 Pt reports central abd pain & back pain. Pt also reports nausea and vomiting that began today. Hx small bowl obstruction.

## 2024-03-19 NOTE — ED Provider Notes (Signed)
 [facility] Provider Note    Event Date/Time   First MD Initiated Contact with Patient 03/19/24 2127     (approximate)   History   Abdominal Pain   HPI  Maria Fuller is a 38 y.o. 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   Physical Exam   Triage Vital Signs: ED Triage Vitals  Encounter Vitals Group     BP 03/19/24 2002 (!) 148/82     Girls Systolic BP Percentile --      Girls Diastolic BP Percentile --      Boys Systolic BP Percentile --      Boys Diastolic BP Percentile --      Pulse Rate 03/19/24 2002 (!) 58     Resp 03/19/24 2002 18     Temp 03/19/24 2002 98.1 F (36.7 C)     Temp Source 03/19/24 2002 Oral     SpO2 03/19/24 2002 98 %     Weight 03/19/24 2001 250 lb (113.4 kg)     Height 03/19/24 2001 6' 1 (1.854 m)     Head Circumference --      Peak Flow --      Pain Score 03/19/24 2006 8     Pain Loc --      Pain Education --      Exclude from Growth Chart --     Most recent vital signs: Vitals:   03/19/24 2002  BP: (!) 148/82  Pulse: (!) 58  Resp: 18  Temp: 98.1 F (36.7 C)  SpO2: 98%    General: Awake, no distress.  CV:  Good peripheral perfusion.  Resp:  Normal effort.  Abd:  No distention.  Lower abdominal tenderness, guarding with deeper palpation.  No peritoneal features MSK:  No deformity noted.  Neuro:  No focal deficits appreciated. Other:     ED Results / Procedures / Treatments   Labs (all labs ordered are listed, but only abnormal results are displayed) Labs Reviewed  COMPREHENSIVE METABOLIC PANEL WITH GFR - Abnormal; Notable for the following components:      Result Value   Calcium 8.3 (*)    All other components within normal limits  CBC - Abnormal; Notable for the following components:   RBC 3.28 (*)    Hemoglobin 9.9 (*)    HCT 30.1 (*)    All other components  within normal limits  URINALYSIS, ROUTINE W REFLEX MICROSCOPIC - Abnormal; Notable for the following components:   Color, Urine YELLOW (*)    APPearance CLEAR (*)    All other components within normal limits  LIPASE, BLOOD  POC URINE PREG, ED    EKG   RADIOLOGY CT abdomen/pelvis interpreted by me with signs of SBO  Official radiology report(s): CT ABDOMEN PELVIS W CONTRAST Result Date: 03/19/2024 EXAM: CT ABDOMEN AND PELVIS WITH CONTRAST 03/19/2024 10:07:16 PM TECHNIQUE: CT of the abdomen and pelvis was performed with the administration of 100 mL of iohexol  (OMNIPAQUE ) 300 MG/ML solution. Multiplanar reformatted images are provided for review. Automated exposure control, iterative reconstruction, and/or weight-based adjustment of the mA/kV was utilized to reduce the radiation dose to as low as reasonably achievable. COMPARISON: CT abdomen and pelvis with IV contrast 02/24/2024 and 02/20/2024. There is a large number of prior CTs, but these are the 2 most recent. CLINICAL HISTORY: Central abdominal pain and  back pain, nausea, and vomiting. Prior history of small bowel obstruction. FINDINGS: LOWER CHEST: Scattered linear scarring or atelectasis in the lung bases without infiltrates. Small hiatal hernia. The cardiac size is normal. LIVER: The liver is 19 cm in length and mildly steatotic without mass. GALLBLADDER AND BILE DUCTS: The gallbladder is absent without bile duct dilatation. SPLEEN: No acute abnormality. PANCREAS: No acute abnormality. ADRENAL GLANDS: There is no adrenal mass. KIDNEYS, URETERS AND BLADDER: There is no renal mass. 1.7 cm Bosniak 1 cyst again noted in the superior pole of the left kidney, Hounsfield density is 12. No follow-up imaging is recommended. There is no urinary stone or hydronephrosis. No perinephric or periureteral stranding. Urinary bladder is unremarkable. GI AND BOWEL: Remote surgical changes of gastric bypass. There is increased stool filling and dilatation of  right lower quadrant postsurgical small bowel up to 5 cm on axial 6 mm 3 of series 2, with dilatation of adjacent nonsurgical small bowel up to 3.6 cm. The upstream small bowel is normal in caliber. There is a discrete transition from dilated to collapsed small bowel at an angulated segment in the anterior pelvis to the right, axial images 81 through 84. Findings could indicate a distal small bowel obstruction due to adhesions. As before, the cecum and ascending colon interpose posterior to the liver. Appendix is normal. There is mild wall thickening or underdistention in the ascending colon. Correlate clinically for underlying colitis. The more distal colon is unremarkable. There is moderate retained stool in the transverse colon. PERITONEUM AND RETROPERITONEUM: There is trace perihepatic ascites. No free air. VASCULATURE: Aorta is normal in caliber. LYMPH NODES: No lymphadenopathy. REPRODUCTIVE ORGANS: There is an IUD within the uterus with interval retroversion of the uterus. The ovaries are not enlarged. There is trace fluid in the posterior pelvis to the right. BONES AND SOFT TISSUES: There are chronic L5 pars defects, chronic grade 1 L5-S1 spondylolisthesis, and partial disc collapse. There is mild hip djd and osteopenia. No focal soft tissue abnormality. IMPRESSION: 1. Findings consistent with distal small bowel obstruction with a discrete transition point in the right anterior pelvis, likely related to adhesions. There is increased stool dilatation of postsurgical and nonsurgical small bowel segments in the right lower quadrant. The upstream small bowel is not dilated. 2. Mild ascending colonic wall thickening versus underdistention; consider colitis as a differential. 3. Moderate retained stool in the transverse colon. 4. Trace perihepatic and pelvic free fluid, likely reactive to bowel pathology. 5. Nonacute chronic findings discussed above. Electronically signed by: Francis Quam MD 03/19/2024 10:33 PM  EST RP Workstation: [id number]    PROCEDURES and INTERVENTIONS:  Procedures  Medications  ondansetron  (ZOFRAN ) injection 4 mg (4 mg Intravenous Given 03/19/24 2153)  HYDROmorphone  (DILAUDID ) injection 1 mg (1 mg Intravenous Given 03/19/24 2153)  iohexol  (OMNIPAQUE ) 300 MG/ML solution 100 mL (100 mLs Intravenous Contrast Given 03/19/24 2200)  lactated ringers  bolus 1,000 mL (1,000 mLs Intravenous New Bag/Given 03/19/24 2309)  HYDROmorphone  (DILAUDID ) injection 0.5 mg (0.5 mg Intravenous Given 03/19/24 2310)  ondansetron  (ZOFRAN ) injection 4 mg (4 mg Intravenous Given 03/19/24 2310)     IMPRESSION / MDM / ASSESSMENT AND PLAN / ED COURSE  I reviewed the triage vital signs and the nursing notes.  Differential diagnosis includes, but is not limited to, SBO, perforated viscus, UTI, AKI, dehydration, pancreatitis  {Patient presents with symptoms of an acute illness or injury that is potentially life-threatening.  Patient with history of duodenal switch and multiple abdominal surgeries presents with signs  of an SBO requiring medical admission.  Localized tenderness.  Serum workup is reassuring with normal metabolic panel, lipase, UA and CBC.  CT, as above.  Consult medicine for admission.      FINAL CLINICAL IMPRESSION(S) / ED DIAGNOSES   Final diagnoses:  Small bowel obstruction (HCC)     Rx / DC Orders   ED Discharge Orders     None        Note:  This document was prepared using Dragon voice recognition software and may include unintentional dictation errors.   Claudene Rover, MD 03/19/24 [phone]

## 2024-03-20 ENCOUNTER — Inpatient Hospital Stay

## 2024-03-20 DIAGNOSIS — K56609 Unspecified intestinal obstruction, unspecified as to partial versus complete obstruction: Secondary | ICD-10-CM | POA: Diagnosis not present

## 2024-03-20 LAB — BASIC METABOLIC PANEL WITH GFR
Anion gap: 9 (ref 5–15)
BUN: 11 mg/dL (ref 6–20)
CO2: 23 mmol/L (ref 22–32)
Calcium: 8 mg/dL — ABNORMAL LOW (ref 8.9–10.3)
Chloride: 109 mmol/L (ref 98–111)
Creatinine, Ser: 0.5 mg/dL (ref 0.44–1.00)
GFR, Estimated: >60 mL/min (ref >60)
Glucose, Bld: 97 mg/dL (ref 70–99)
Potassium: 3.5 mmol/L (ref 3.5–5.1)
Sodium: 141 mmol/L (ref 135–145)

## 2024-03-20 LAB — CBC
HCT: 30.8 % — ABNORMAL LOW (ref 36.0–46.0)
Hemoglobin: 10 g/dL — ABNORMAL LOW (ref 12.0–15.0)
MCH: 30.3 pg (ref 26.0–34.0)
MCHC: 32.5 g/dL (ref 30.0–36.0)
MCV: 93.3 fL (ref 80.0–100.0)
Platelets: 313 K/uL (ref 150–400)
RBC: 3.3 MIL/uL — ABNORMAL LOW (ref 3.87–5.11)
RDW: 14 % (ref 11.5–15.5)
WBC: 6.1 K/uL (ref 4.0–10.5)
nRBC: 0 % (ref 0.0–0.2)

## 2024-03-20 LAB — TYPE AND SCREEN
ABO/RH(D): O POS
Antibody Screen: NEGATIVE

## 2024-03-20 LAB — APTT: aPTT: 38 s — ABNORMAL HIGH (ref 24–36)

## 2024-03-20 LAB — PROTIME-INR
INR: 1.2 (ref 0.8–1.2)
Prothrombin Time: 15.4 s — ABNORMAL HIGH (ref 11.4–15.2)

## 2024-03-20 MED ORDER — DICYCLOMINE HCL 10 MG PO CAPS
10.0000 mg | ORAL_CAPSULE | Freq: Three times a day (TID) | ORAL | Status: DC
  Administered 2024-03-20 – 2024-03-21 (×5): 10 mg via ORAL
  Filled 2024-03-20 (×5): qty 1

## 2024-03-20 MED ORDER — DIATRIZOATE MEGLUMINE & SODIUM 66-10 % PO SOLN
90.0000 mL | Freq: Once | ORAL | Status: AC
  Administered 2024-03-20: 90 mL via NASOGASTRIC

## 2024-03-20 MED ORDER — GABAPENTIN 300 MG PO CAPS
600.0000 mg | ORAL_CAPSULE | Freq: Three times a day (TID) | ORAL | Status: DC
  Administered 2024-03-20 – 2024-03-21 (×4): 600 mg via ORAL
  Filled 2024-03-20 (×4): qty 2

## 2024-03-20 MED ORDER — METHOCARBAMOL 500 MG PO TABS
500.0000 mg | ORAL_TABLET | Freq: Three times a day (TID) | ORAL | Status: DC
  Administered 2024-03-20 – 2024-03-21 (×4): 500 mg via ORAL
  Filled 2024-03-20 (×4): qty 1

## 2024-03-20 MED ORDER — PANTOPRAZOLE SODIUM 40 MG PO TBEC
40.0000 mg | DELAYED_RELEASE_TABLET | Freq: Two times a day (BID) | ORAL | Status: DC
  Administered 2024-03-20 – 2024-03-21 (×3): 40 mg via ORAL
  Filled 2024-03-20 (×3): qty 1

## 2024-03-20 MED ORDER — HYDROMORPHONE HCL 1 MG/ML IJ SOLN
0.5000 mg | Freq: Once | INTRAMUSCULAR | Status: AC
  Administered 2024-03-20: 0.5 mg via INTRAVENOUS
  Filled 2024-03-20: qty 0.5

## 2024-03-20 MED ORDER — TRAZODONE HCL 50 MG PO TABS
50.0000 mg | ORAL_TABLET | Freq: Every day | ORAL | Status: DC
  Administered 2024-03-20: 50 mg via ORAL
  Filled 2024-03-20: qty 1

## 2024-03-20 MED ORDER — HYDROXYZINE HCL 25 MG PO TABS: 25.0000 mg | ORAL_TABLET | Freq: Three times a day (TID) | ORAL | Status: DC | PRN

## 2024-03-20 MED ORDER — SERTRALINE HCL 50 MG PO TABS
100.0000 mg | ORAL_TABLET | Freq: Two times a day (BID) | ORAL | Status: DC
  Administered 2024-03-20 – 2024-03-21 (×3): 100 mg via ORAL
  Filled 2024-03-20 (×3): qty 2

## 2024-03-20 MED ORDER — CLONAZEPAM 0.5 MG PO TABS
0.5000 mg | ORAL_TABLET | Freq: Every day | ORAL | Status: DC
  Administered 2024-03-20 – 2024-03-21 (×2): 0.5 mg via ORAL
  Filled 2024-03-20 (×2): qty 1

## 2024-03-20 MED ORDER — LORAZEPAM 0.5 MG PO TABS
0.5000 mg | ORAL_TABLET | Freq: Three times a day (TID) | ORAL | Status: DC | PRN
  Administered 2024-03-20: 0.5 mg via ORAL
  Filled 2024-03-20: qty 1

## 2024-03-20 MED ORDER — BUPROPION HCL ER (XL) 150 MG PO TB24
150.0000 mg | ORAL_TABLET | Freq: Every day | ORAL | Status: DC
  Administered 2024-03-20 – 2024-03-21 (×2): 150 mg via ORAL
  Filled 2024-03-20 (×2): qty 1

## 2024-03-20 NOTE — Plan of Care (Signed)

## 2024-03-20 NOTE — Progress Notes (Signed)
 PROGRESS NOTE    Maria Fuller  FMW:[id number] DOB: February 05, 1986 DOA: 03/19/2024 PCP: Perri Constance Sor, PA-C    Brief Narrative:  38 y.o. female with medical history significant of gastric bypass surgery, incarcerated hernia, multiple small bowel obstruction, chronic abdominal pain, depression with anxiety, GERD, hypothyroidism, anemia, obesity, resolved hypertension, diabetes and OSA after gastric bypass surgery, who presents with nausea, vomiting, abdominal pain.   Patient states that her symptoms started this morning.  She has had 4 times of nonbilious nonbloody vomiting.  Her abdominal pain is located in the central abdomen, constant, aching, 6 out of 10 in severity, radiating to the back and groin areas, not aggravated or alleviated by any known factors.  No fever or chills.  Her last bowel movement was 2 days ago.  No chest pain, cough, SOB.  No symptoms of UTI.  No fever or chills.   Assessment & Plan:   Principal Problem:   Small bowel obstruction (HCC) Active Problems:   HTN (hypertension)   Normocytic anemia   Depression with anxiety   Obesity (BMI 30-39.9)  Small bowel obstruction [facility]): Patient presentation is somewhat atypical.  The diagnosis is not entirely clear.  Patient had a diagnostic laparoscopy in [facility] in April of this year with no acute findings.  Specifically no adhesions were noted. Plan: Continue n.p.o. NGT set to low intermittent suction IV fluids Pain control Surgery following Gastrografin  study today   HTN (hypertension):  Blood pressure 148/82.   Patient did not need Bp medications after gastric bypass surgery. - IV hydralazine as needed   Normocytic anemia:  Hemoglobin 9.9 (10.4 03/16/2024) -Follow-up by CBC   Depression with anxiety -As needed IV Ativan 0.5 mg every 8h - Continue home Klonopin, hydroxyzine, Wellbutrin , Zoloft , trazodone    Obesity (BMI 30-39.9):  S/p of gastric bypass surgery. Patient has Obesity Class I, with body  weight 113.4 Kg and BMI 32.98 kg/m2.  Complicating factor in overall care and prognosis - Encourage losing weight - Exercise and healthy diet     DVT prophylaxis: SCD Code Status: Full Family Communication: None Disposition Plan: Status is: Inpatient Remains inpatient appropriate because: SBO   Level of care: Med-Surg  Consultants:  General surgery  Procedures:  Nasogastric tube placement  Antimicrobials: None   Subjective: Seen and examined.  Appears fatigued otherwise stable.  States that pain is under control with use of narcotic pain medication  Objective: Vitals:   03/19/24 2354 03/20/24 0030 03/20/24 0534 03/20/24 0730  BP:  128/84 133/79 (!) 159/93  Pulse:  (!) 55 (!) 55 62  Resp:  16 16 18   Temp: 97.9 F (36.6 C) 98.4 F (36.9 C) 98 F (36.7 C) 97.6 F (36.4 C)  TempSrc: Oral Oral  Oral  SpO2:  98% 97% 96%  Weight:  121.2 kg    Height:  6' 0.99 (1.854 m)      Intake/Output Summary (Last 24 hours) at 03/20/2024 1506 Last data filed at 03/20/2024 0536 Gross per 24 hour  Intake 120 ml  Output 200 ml  Net -80 ml   Filed Weights   03/19/24 2001 03/20/24 0030  Weight: 113.4 kg 121.2 kg    Examination:  General exam: Appears fatigued Respiratory system: Clear to auscultation. Respiratory effort normal. Cardiovascular system: S1-S2, RRR, no murmurs, no pedal edema Gastrointestinal system: Soft, mild distention, no guarding or rebound Central nervous system: Alert and oriented. No focal neurological deficits. Extremities: Symmetric 5 x 5 power. Skin: No rashes, lesions or ulcers Psychiatry: Judgement  and insight appear normal. Mood & affect appropriate.     Data Reviewed: I have personally reviewed following labs and imaging studies  CBC: Recent Labs  Lab 03/16/24 1127 03/19/24 2003 03/20/24 0554  WBC 6.0 5.5 6.1  HGB 10.4* 9.9* 10.0*  HCT 32.1* 30.1* 30.8*  MCV 93.3 91.8 93.3  PLT 340 323 313   Basic Metabolic Panel: Recent Labs   Lab 03/16/24 1127 03/19/24 2003 03/20/24 0554  NA 139 141 141  K 3.6 4.0 3.5  CL 109 109 109  CO2 22 24 23   GLUCOSE 80 85 97  BUN 12 11 11   CREATININE 0.58 0.59 0.50  CALCIUM 8.4* 8.3* 8.0*   GFR: Estimated Creatinine Clearance: 139.7 mL/min (by C-G formula based on SCr of 0.5 mg/dL). Liver Function Tests: Recent Labs  Lab 03/16/24 1127 03/19/24 2003  AST 31 23  ALT 23 23  ALKPHOS 133* 121  BILITOT 0.5 0.5  PROT 6.9 6.5  ALBUMIN 4.3 4.2   Recent Labs  Lab 03/16/24 1127 03/19/24 2003  LIPASE 13 15   No results for input(s): AMMONIA in the last 168 hours. Coagulation Profile: Recent Labs  Lab 03/20/24 0554  INR 1.2   Cardiac Enzymes: No results for input(s): CKTOTAL, CKMB, CKMBINDEX, TROPONINI in the last 168 hours. BNP (last 3 results) No results for input(s): PROBNP in the last 8760 hours. HbA1C: No results for input(s): HGBA1C in the last 72 hours. CBG: No results for input(s): GLUCAP in the last 168 hours. Lipid Profile: No results for input(s): CHOL, HDL, LDLCALC, TRIG, CHOLHDL, LDLDIRECT in the last 72 hours. Thyroid  Function Tests: No results for input(s): TSH, T4TOTAL, FREET4, T3FREE, THYROIDAB in the last 72 hours. Anemia Panel: No results for input(s): VITAMINB12, FOLATE, FERRITIN, TIBC, IRON, RETICCTPCT in the last 72 hours. Sepsis Labs: No results for input(s): PROCALCITON, LATICACIDVEN in the last 168 hours.  No results found for this or any previous visit (from the past 240 hours).       Radiology Studies: DG Abd 1 View Result Date: 03/20/2024 EXAM: 1 VIEW XRAY OF THE ABDOMEN 03/20/2024 02:18:00 AM COMPARISON: CT with IV contrast yesterday at 10:03 PM. CLINICAL HISTORY: Encounter for imaging study to confirm nasogastric (NG) tube placement. FINDINGS: LINES, TUBES AND DEVICES: Nasogastric tube has been inserted, appears adequately intragastric with the tip in the body of the stomach.  BOWEL: Nonobstructive bowel gas pattern. There is moderate retained stool in the visualized colon. Mild dilatation of the mid abdominal small bowel is similar to the CT. SOFT TISSUES: No opaque urinary calculi. BONES: No acute osseous abnormality. LIMITATIONS: The pelvis was excluded from the exam. IMPRESSION: 1. NGT tip in the body of the stomach. Electronically signed by: Francis Quam MD 03/20/2024 02:45 AM EST RP Workstation: [id number]   CT ABDOMEN PELVIS W CONTRAST Result Date: 03/19/2024 EXAM: CT ABDOMEN AND PELVIS WITH CONTRAST 03/19/2024 10:07:16 PM TECHNIQUE: CT of the abdomen and pelvis was performed with the administration of 100 mL of iohexol  (OMNIPAQUE ) 300 MG/ML solution. Multiplanar reformatted images are provided for review. Automated exposure control, iterative reconstruction, and/or weight-based adjustment of the mA/kV was utilized to reduce the radiation dose to as low as reasonably achievable. COMPARISON: CT abdomen and pelvis with IV contrast 02/24/2024 and 02/20/2024. There is a large number of prior CTs, but these are the 2 most recent. CLINICAL HISTORY: Central abdominal pain and back pain, nausea, and vomiting. Prior history of small bowel obstruction. FINDINGS: LOWER CHEST: Scattered linear scarring or atelectasis in the  lung bases without infiltrates. Small hiatal hernia. The cardiac size is normal. LIVER: The liver is 19 cm in length and mildly steatotic without mass. GALLBLADDER AND BILE DUCTS: The gallbladder is absent without bile duct dilatation. SPLEEN: No acute abnormality. PANCREAS: No acute abnormality. ADRENAL GLANDS: There is no adrenal mass. KIDNEYS, URETERS AND BLADDER: There is no renal mass. 1.7 cm Bosniak 1 cyst again noted in the superior pole of the left kidney, Hounsfield density is 12. No follow-up imaging is recommended. There is no urinary stone or hydronephrosis. No perinephric or periureteral stranding. Urinary bladder is unremarkable. GI AND BOWEL: Remote  surgical changes of gastric bypass. There is increased stool filling and dilatation of right lower quadrant postsurgical small bowel up to 5 cm on axial 6 mm 3 of series 2, with dilatation of adjacent nonsurgical small bowel up to 3.6 cm. The upstream small bowel is normal in caliber. There is a discrete transition from dilated to collapsed small bowel at an angulated segment in the anterior pelvis to the right, axial images 81 through 84. Findings could indicate a distal small bowel obstruction due to adhesions. As before, the cecum and ascending colon interpose posterior to the liver. Appendix is normal. There is mild wall thickening or underdistention in the ascending colon. Correlate clinically for underlying colitis. The more distal colon is unremarkable. There is moderate retained stool in the transverse colon. PERITONEUM AND RETROPERITONEUM: There is trace perihepatic ascites. No free air. VASCULATURE: Aorta is normal in caliber. LYMPH NODES: No lymphadenopathy. REPRODUCTIVE ORGANS: There is an IUD within the uterus with interval retroversion of the uterus. The ovaries are not enlarged. There is trace fluid in the posterior pelvis to the right. BONES AND SOFT TISSUES: There are chronic L5 pars defects, chronic grade 1 L5-S1 spondylolisthesis, and partial disc collapse. There is mild hip djd and osteopenia. No focal soft tissue abnormality. IMPRESSION: 1. Findings consistent with distal small bowel obstruction with a discrete transition point in the right anterior pelvis, likely related to adhesions. There is increased stool dilatation of postsurgical and nonsurgical small bowel segments in the right lower quadrant. The upstream small bowel is not dilated. 2. Mild ascending colonic wall thickening versus underdistention; consider colitis as a differential. 3. Moderate retained stool in the transverse colon. 4. Trace perihepatic and pelvic free fluid, likely reactive to bowel pathology. 5. Nonacute chronic  findings discussed above. Electronically signed by: Francis Quam MD 03/19/2024 10:33 PM EST RP Workstation: [id number]        Scheduled Meds:  buPROPion   150 mg Oral Q breakfast   clonazePAM  0.5 mg Oral Daily   dicyclomine   10 mg Oral TID AC & HS   gabapentin   600 mg Oral TID   methocarbamol  500 mg Oral TID   pantoprazole   40 mg Oral BID AC   sertraline   100 mg Oral BID   traZODone   50 mg Oral QHS   Continuous Infusions:  lactated ringers  100 mL/hr at 03/20/24 1429     LOS: 1 day      Calvin KATHEE Robson, MD [facility]   If 7PM-7AM, please contact night-coverage  03/20/2024, 3:06 PM

## 2024-03-20 NOTE — Consult Note (Signed)
 SURGICAL CONSULTATION NOTE   HISTORY OF PRESENT ILLNESS (HPI):  38 y.o. female presented to [facility] ED for evaluation of pain, nausea and vomiting since yesterday. Patient reports has been having centralized and right sided abdominal pain.  This pain has been intermittent for the last year.  Pain restarted yesterday.  She has had extensive workup for this pain including diagnostic laparoscopy at [facility] in April.  She has come back with suspected episode of bowel obstruction that resolved with medical therapy.  During diagnostic laparoscopy there was no adhesions identified.  Here at the ER she was found with stable vital signs.  Labs shows normal white blood cell count with hemoglobin of 10.0 which is at her baseline.  There is no significant electrolyte disturbance.  She had a CT scan of the abdomen and pelvis that shows suspected distal small bowel obstruction with discrete transition point in the right lower abdomen.  I personally evaluated the images.  Surgery is consulted by Dr. Hilma in this context for evaluation and management of small bowel obstruction.  PAST MEDICAL HISTORY (PMH):  Past Medical History:  Diagnosis Date   Anemia    slight   Bradycardia    Depression    Diabetes mellitus without complication (HCC)    resolved with weight loss from gastric bypass   Headache    migraines. resolved since gastric bypass   High cholesterol    resolved since weight loss surgery   History of kidney stones    last week   Hypertension    resolved since gastric bypass, severe pre-eclampsia with G1   Incarcerated incisional hernia 07/27/2021   Low BP    Meningitis 2015   Partial small bowel obstruction (HCC) 08/04/2019   PCOS (polycystic ovarian syndrome)    Preterm labor    Recurrent ventral incisional hernia    Sleep apnea    had gastric bypass surgery and now resolved   Small bowel obstruction (HCC)    Syncope    Vertigo couple years ago     PAST SURGICAL HISTORY (PSH):  Past  Surgical History:  Procedure Laterality Date   BIOPSY  11/26/2022   Procedure: BIOPSY;  Surgeon: Unk Corinn Skiff, MD;  Location: [facility] ENDOSCOPY;  Service: Gastroenterology;;   CESAREAN SECTION     CESAREAN SECTION N/A 05/07/2016   Procedure: CESAREAN SECTION;  Surgeon: Lamar SHAUNNA Lesches, MD;  Location: [facility] ORS;  Service: Obstetrics;  Laterality: N/A;  Time of birth: [birth year] Sex: Female Weight: 3940 kg, 8 lb 11 oz   CHOLECYSTECTOMY     COLON SURGERY     COLONOSCOPY WITH PROPOFOL  N/A 03/16/2023   Procedure: COLONOSCOPY WITH PROPOFOL ;  Surgeon: Maryruth Ole DASEN, MD;  Location: [facility] ENDOSCOPY;  Service: Endoscopy;  Laterality: N/A;   CYSTOSCOPY WITH STENT PLACEMENT Right 09/19/2016   Procedure: CYSTOSCOPY WITH STENT PLACEMENT;  Surgeon: Chauncey Redell Agent, MD;  Location: [facility] ORS;  Service: Urology;  Laterality: Right;   CYSTOSCOPY WITH STENT PLACEMENT Left 04/18/2022   Procedure: CYSTOSCOPY WITH STENT PLACEMENT;  Surgeon: Watt Rush, MD;  Location: [facility] ORS;  Service: Urology;  Laterality: Left;   CYSTOSCOPY/URETEROSCOPY/HOLMIUM LASER/STENT PLACEMENT Left 05/08/2022   Procedure: CYSTOSCOPY/URETEROSCOPY/HOLMIUM LASER/STENT EXCHANGE;  Surgeon: Penne Knee, MD;  Location: [facility] ORS;  Service: Urology;  Laterality: Left;   DIAGNOSTIC LAPAROSCOPY     ESOPHAGOGASTRODUODENOSCOPY (EGD) WITH PROPOFOL  N/A 11/23/2022   Procedure: ESOPHAGOGASTRODUODENOSCOPY (EGD) WITH PROPOFOL ;  Surgeon: Therisa Bi, MD;  Location: [facility] ENDOSCOPY;  Service: Gastroenterology;  Laterality: N/A;   ESOPHAGOGASTRODUODENOSCOPY (EGD)  WITH PROPOFOL  N/A 11/26/2022   Procedure: ESOPHAGOGASTRODUODENOSCOPY (EGD) WITH PROPOFOL ;  Surgeon: Unk Corinn Skiff, MD;  Location: [facility] ENDOSCOPY;  Service: Gastroenterology;  Laterality: N/A;   EXTRACORPOREAL SHOCK WAVE LITHOTRIPSY Left 05/15/2022   Procedure: EXTRACORPOREAL SHOCK WAVE LITHOTRIPSY (ESWL);  Surgeon: Penne Knee, MD;  Location: [facility] ORS;  Service: Urology;  Laterality:  Left;   FLEXIBLE SIGMOIDOSCOPY N/A 07/28/2019   Procedure: FLEXIBLE SIGMOIDOSCOPY;  Surgeon: Unk Corinn Skiff, MD;  Location: [facility];  Service: Endoscopy;  Laterality: N/A;   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   HERNIA REPAIR     INSERTION OF MESH N/A 07/07/2022   Procedure: INSERTION OF MESH;  Surgeon: Lane Shope, MD;  Location: [facility] ORS;  Service: General;  Laterality: N/A;   LAPAROSCOPIC OVARIAN CYSTECTOMY Right 09/28/2018   Procedure: LAPAROSCOPIC RIGHT OVARIAN CYSTECTOMY;  Surgeon: Arloa Lamar SQUIBB, MD;  Location: [facility];  Service: Gynecology;  Laterality: Right;   LAPAROSCOPIC UNILATERAL SALPINGO OOPHERECTOMY Right 07/25/2021   Procedure: LAPAROSCOPIC UNILATERAL SALPINGO OOPHORECTOMY;  Surgeon: Arloa Lamar SQUIBB, MD;  Location: [facility];  Service: Gynecology;  Laterality: Right;   LEFT HEART CATH AND CORONARY ANGIOGRAPHY Left 04/07/2018   Procedure: LEFT HEART CATH AND CORONARY ANGIOGRAPHY;  Surgeon: Florencio Cara BIRCH, MD;  Location: [facility];  Service: Cardiovascular;  Laterality: Left;   OOPHORECTOMY     POLYPECTOMY  03/16/2023   Procedure: POLYPECTOMY;  Surgeon: Maryruth Ole DASEN, MD;  Location: [facility] ENDOSCOPY;  Service: Endoscopy;;   small bowel obstruction  07/2019   TONSILECTOMY, ADENOIDECTOMY, BILATERAL MYRINGOTOMY AND TUBES     TONSILLECTOMY     URETEROSCOPY WITH HOLMIUM LASER LITHOTRIPSY Right 09/19/2016   Procedure: URETEROSCOPY WITH HOLMIUM LASER LITHOTRIPSY;  Surgeon: Chauncey Redell Agent, MD;  Location: [facility] OR[facility];  Service: Urology;  Laterality: Right;   VENTRAL HERNIA REPAIR     scheduled for 07/07/2022   XI ROBOTIC ASSISTED VENTRAL HERNIA N/A 07/27/2021   Procedure: XI ROBOTIC ASSISTED INCARCERATED INCISIONAL HERNIA REPAIR;  Surgeon: Lane Shope, MD;  Location: [facility] OR[facility];  Service: General;  Laterality: N/A;   XI ROBOTIC ASSISTED VENTRAL HERNIA N/A 07/07/2022   Procedure: XI ROBOTIC ASSISTED VENTRAL HERNIA, recurrent  incisional with mesh;  Surgeon: Lane Shope, MD;  Location: [facility] ORS;  Service: General;  Laterality: N/A;     MEDICATIONS:  Prior to Admission medications   Medication Sig Start Date End Date Taking? Authorizing Provider  buPROPion  (WELLBUTRIN  XL) 150 MG 24 hr tablet Take 150 mg by mouth every morning. 06/17/21  Yes [provider]  clonazePAM (KLONOPIN) 0.5 MG tablet Take 0.5 mg by mouth daily.   Yes [provider]  dicyclomine  (BENTYL ) 10 MG capsule Take 1 capsule (10 mg total) by mouth 4 (four) times daily -  before meals and at bedtime. 08/10/23  Yes Menshew, Candida LULLA Kings, PA-C  furosemide (LASIX) 20 MG tablet Take 1 tablet (20 mg total) by mouth daily as needed. 02/22/24  Yes Gollan, Timothy J, MD  gabapentin  (NEURONTIN ) 600 MG tablet Take 600 mg by mouth 3 (three) times daily. 02/20/24  Yes [provider]  hydrOXYzine (ATARAX) 25 MG tablet Take 1 tablet by mouth 3 (three) times daily as needed. 12/17/22  Yes [provider]  methocarbamol (ROBAXIN) 500 MG tablet Take 500 mg by mouth 3 (three) times daily.   Yes [provider]  nystatin (MYCOSTATIN/NYSTOP) powder Apply 1 Application topically as needed. 02/12/24 02/11/25 Yes [provider]  ondansetron  (ZOFRAN -ODT) 4 MG disintegrating tablet  Take 1 tablet (4 mg total) by mouth every 8 (eight) hours as needed for nausea or vomiting. 08/10/23  Yes Menshew, Candida LULLA Kings, PA-C  oxyCODONE -acetaminophen  (PERCOCET) 5-325 MG tablet Take 1 tablet by mouth every 6 (six) hours as needed for severe pain (pain score 7-10). 03/16/24  Yes Bradler, Evan K, MD  pantoprazole  (PROTONIX ) 40 MG tablet Take 1 tablet (40 mg total) by mouth 2 (two) times daily before a meal. 11/26/22  Yes Marsa Edelman, DO  polyethylene glycol powder (GLYCOLAX /MIRALAX ) 17 GM/SCOOP powder Take 17 g by mouth daily. 06/23/23  Yes Dorothyann Drivers, MD  potassium chloride  SA (KLOR-CON  M) 20 MEQ tablet Take 1 tablet (20 mEq  total) by mouth daily as needed. 02/22/24  Yes Gollan, Timothy J, MD  promethazine  (PHENERGAN ) 25 MG suppository Place 1 suppository (25 mg total) rectally every 6 (six) hours as needed for nausea or vomiting. 11/17/23  Yes Bradler, Evan K, MD  scopolamine  (TRANSDERM-SCOP) 1 MG/3DAYS 1 patch every three (3) days as needed. 08/11/22  Yes [provider]  sertraline  (ZOLOFT ) 100 MG tablet Take 100 mg by mouth 2 (two) times daily.   Yes [provider]  traZODone  (DESYREL ) 50 MG tablet Take 50 mg by mouth at bedtime.  09/06/18  Yes [provider]  ketoconazole (NIZORAL) 2 % cream Apply 1 Application topically daily as needed. Patient not taking: Reported on 03/19/2024 02/16/24 02/15/25  [provider]  levonorgestrel  (MIRENA ) 20 MCG/24HR IUD 1 each by Intrauterine route once.    [provider]  ondansetron  (ZOFRAN -ODT) 4 MG disintegrating tablet Take 1 tablet (4 mg total) by mouth every 8 (eight) hours as needed. 09/12/23   Margrette, Myah A, PA-C  sucralfate (CARAFATE) 1 GM/10ML suspension Take 10 mLs (1 g total) by mouth 3 (three) times daily as needed. Patient not taking: Reported on 03/19/2024 02/20/24 02/19/25  Herlinda Kirk NOVAK, FNP     ALLERGIES:  Allergies  Allergen Reactions   Cefuroxime Dermatitis   Ceftin [Cefuroxime Axetil] Rash    Tolerated cefazolin  before   Penicillins Rash and Other (See Comments)    TOLERATED CEFAZOLIN  BEFORE   Prochlorperazine  Other (See Comments) and Anxiety    Panic attack     SOCIAL HISTORY:  Social History   Socioeconomic History   Marital status: Married    Spouse name: Not on file   Number of children: Not on file   Years of education: Not on file   Highest education level: Not on file  Occupational History   Not on file  Tobacco Use   Smoking status: Never    Passive exposure: Never   Smokeless tobacco: Never  Vaping Use   Vaping status: Never Used  Substance and Sexual Activity   Alcohol use: No    Drug use: No   Sexual activity: Yes  Other Topics Concern   Not on file  Social History Narrative   Married and lives at home with two sons and parents.   Social Drivers of Health   Financial Resource Strain: Medium Risk (02/03/2024)   Received from [facility]   Overall Financial Resource Strain (CARDIA)    Difficulty of Paying Living Expenses: Somewhat hard  Food Insecurity: No Food Insecurity (03/20/2024)   Hunger Vital Sign    Worried About Running Out of Food in the Last Year: Never true    Ran Out of Food in the Last Year: Never true  Recent Concern: Food Insecurity - Food Insecurity Present (02/03/2024)  Received from [facility]   Hunger Vital Sign    Within the past 12 months, you worried that your food would run out before you got the money to buy more.: Sometimes true    Within the past 12 months, the food you bought just didn't last and you didn't have money to get more.: Never true  Transportation Needs: No Transportation Needs (03/20/2024)   PRAPARE - Administrator, Civil Service (Medical): No    Lack of Transportation (Non-Medical): No  Physical Activity: Not on file  Stress: Not on file  Social Connections: Moderately Integrated (08/03/2023)   Social Connection and Isolation Panel    Frequency of Communication with Friends and Family: More than three times a week    Frequency of Social Gatherings with Friends and Family: Twice a week    Attends Religious Services: 1 to 4 times per year    Active Member of [employer] or Organizations: No    Attends Banker Meetings: Never    Marital Status: Married  Catering Manager Violence: Not At Risk (03/20/2024)   Humiliation, Afraid, Rape, and Kick questionnaire    Fear of Current or Ex-Partner: No    Emotionally Abused: No    Physically Abused: No    Sexually Abused: No      FAMILY HISTORY:  Family History  Problem Relation Age of Onset   Polycystic ovary  syndrome Mother    Diabetes Father    Bladder Cancer Father    Depression Father    Endometriosis Sister    Polycystic ovary syndrome Sister    Breast cancer Paternal Grandmother    Diabetes Paternal Grandmother    Diabetes Paternal Grandfather    Kidney cancer Neg Hx    Prostate cancer Neg Hx      REVIEW OF SYSTEMS:  Constitutional: denies weight loss, fever, chills, or sweats  Eyes: denies any other vision changes, history of eye injury  ENT: denies sore throat, hearing problems  Respiratory: denies shortness of breath, wheezing  Cardiovascular: denies chest pain, palpitations  Gastrointestinal: Positive abdominal pain, nausea and vomiting Genitourinary: denies burning with urination or urinary frequency Musculoskeletal: denies any other joint pains or cramps  Skin: denies any other rashes or skin discolorations  Neurological: denies any other headache, dizziness, weakness  Psychiatric: denies any other depression, anxiety   All other review of systems were negative   VITAL SIGNS:  Temp:  [97.6 F (36.4 C)-98.4 F (36.9 C)] 97.6 F (36.4 C) (11/16 0730) Pulse Rate:  [55-62] 62 (11/16 0730) Resp:  [16-18] 18 (11/16 0730) BP: (128-159)/(79-93) 159/93 (11/16 0730) SpO2:  [96 %-98 %] 96 % (11/16 0730) Weight:  [113.4 kg-121.2 kg] 121.2 kg (11/16 0030)     Height: 6' 0.99 (185.4 cm) Weight: 121.2 kg BMI (Calculated): 35.26   INTAKE/OUTPUT:  This shift: No intake/output data recorded.  Last 2 shifts: @IOLAST2SHIFTS @   PHYSICAL EXAM:  Constitutional:  -- Normal body habitus  -- Awake, alert, and oriented x3  Eyes:  -- Pupils equally round and reactive to light  -- No scleral icterus  Ear, nose, and throat:  -- No jugular venous distension  Pulmonary:  -- No crackles  -- Equal breath sounds bilaterally -- Breathing non-labored at rest Cardiovascular:  -- S1, S2 present  -- No pericardial rubs Gastrointestinal:  -- Abdomen soft, nontender, mildly-distended, no  guarding or rebound tenderness -- No abdominal masses appreciated, pulsatile or otherwise  Musculoskeletal and Integumentary:  -- Wounds:  None appreciated -- Extremities: B/L UE and LE FROM, hands and feet warm, no edema  Neurologic:  -- Motor function: intact and symmetric -- Sensation: intact and symmetric   Labs:     Latest Ref Rng & Units 03/20/2024    5:54 AM 03/19/2024    8:03 PM 03/16/2024   11:27 AM  CBC  WBC 4.0 - 10.5 K/uL 6.1  5.5  6.0   Hemoglobin 12.0 - 15.0 g/dL 89.9  9.9  89.5   Hematocrit 36.0 - 46.0 % 30.8  30.1  32.1   Platelets 150 - 400 K/uL 313  323  340       Latest Ref Rng & Units 03/20/2024    5:54 AM 03/19/2024    8:03 PM 03/16/2024   11:27 AM  CMP  Glucose 70 - 99 mg/dL 97  85  80   BUN 6 - 20 mg/dL 11  11  12    Creatinine 0.44 - 1.00 mg/dL 9.49  9.40  9.41   Sodium 135 - 145 mmol/L 141  141  139   Potassium 3.5 - 5.1 mmol/L 3.5  4.0  3.6   Chloride 98 - 111 mmol/L 109  109  109   CO2 22 - 32 mmol/L 23  24  22    Calcium 8.9 - 10.3 mg/dL 8.0  8.3  8.4   Total Protein 6.5 - 8.1 g/dL  6.5  6.9   Total Bilirubin 0.0 - 1.2 mg/dL  0.5  0.5   Alkaline Phos 38 - 126 U/L  121  133   AST 15 - 41 U/L  23  31   ALT 0 - 44 U/L  23  23     From OR Note from surgery 08/28/23 at [facility]:   Imaging studies:  EXAM: CT ABDOMEN AND PELVIS WITH CONTRAST 03/19/2024 10:07:16 PM   TECHNIQUE: CT of the abdomen and pelvis was performed with the administration of 100 mL of iohexol  (OMNIPAQUE ) 300 MG/ML solution. Multiplanar reformatted images are provided for review. Automated exposure control, iterative reconstruction, and/or weight-based adjustment of the mA/kV was utilized to reduce the radiation dose to as low as reasonably achievable.   COMPARISON: CT abdomen and pelvis with IV contrast 02/24/2024 and 02/20/2024. There is a large number of prior CTs, but these are the 2 most recent.   CLINICAL HISTORY: Central abdominal pain and back pain, nausea, and  vomiting. Prior history of small bowel obstruction.   FINDINGS:   LOWER CHEST: Scattered linear scarring or atelectasis in the lung bases without infiltrates. Small hiatal hernia. The cardiac size is normal.   LIVER: The liver is 19 cm in length and mildly steatotic without mass.   GALLBLADDER AND BILE DUCTS: The gallbladder is absent without bile duct dilatation.   SPLEEN: No acute abnormality.   PANCREAS: No acute abnormality.   ADRENAL GLANDS: There is no adrenal mass.   KIDNEYS, URETERS AND BLADDER: There is no renal mass. 1.7 cm Bosniak 1 cyst again noted in the superior pole of the left kidney, Hounsfield density is 12. No follow-up imaging is recommended. There is no urinary stone or hydronephrosis. No perinephric or periureteral stranding. Urinary bladder is unremarkable.   GI AND BOWEL: Remote surgical changes of gastric bypass. There is increased stool filling and dilatation of right lower quadrant postsurgical small bowel up to 5 cm on axial 6 mm 3 of series 2, with dilatation of adjacent nonsurgical small bowel up to 3.6 cm.   The upstream small bowel  is normal in caliber. There is a discrete transition from dilated to collapsed small bowel at an angulated segment in the anterior pelvis to the right, axial images 81 through 84. Findings could indicate a distal small bowel obstruction due to adhesions. As before, the cecum and ascending colon interpose posterior to the liver. Appendix is normal.   There is mild wall thickening or underdistention in the ascending colon. Correlate clinically for underlying colitis.   The more distal colon is unremarkable. There is moderate retained stool in the transverse colon.   PERITONEUM AND RETROPERITONEUM: There is trace perihepatic ascites. No free air.   VASCULATURE: Aorta is normal in caliber.   LYMPH NODES: No lymphadenopathy.   REPRODUCTIVE ORGANS: There is an IUD within the uterus with interval  retroversion of the uterus. The ovaries are not enlarged.   There is trace fluid in the posterior pelvis to the right.   BONES AND SOFT TISSUES: There are chronic L5 pars defects, chronic grade 1 L5-S1 spondylolisthesis, and partial disc collapse. There is mild hip djd and osteopenia. No focal soft tissue abnormality.   IMPRESSION: 1. Findings consistent with distal small bowel obstruction with a discrete transition point in the right anterior pelvis, likely related to adhesions. There is increased stool dilatation of postsurgical and nonsurgical small bowel segments in the right lower quadrant. The upstream small bowel is not dilated. 2. Mild ascending colonic wall thickening versus underdistention; consider colitis as a differential. 3. Moderate retained stool in the transverse colon. 4. Trace perihepatic and pelvic free fluid, likely reactive to bowel pathology. 5. Nonacute chronic findings discussed above.   Electronically signed by: Francis Quam MD 03/19/2024 10:33 PM EST RP Workstation: [id number]  Assessment/Plan:  38 y.o. female with abdominal pain, nausea and vomiting, complicated by pertinent comorbidities including chronic abdominal pain, obesity, GERD.  Patient with recurrent abdominal pain, nausea and vomiting with CT scan concerning for distal small bowel obstruction - Patient has had extensive workup for these intermittent, recurrent pain.  She even had a diagnostic laparoscopy at [facility] on April of this year without any obvious finding of her symptoms.  Specifically there was no adhesions. - They mentioned that there was a subtle narrowing in one of the anastomosis but unable to say if that was the cause of her symptoms.  The even considered restoring anatomy to duodenal switch. - I ordered a Gastrografin  to see if this help with improve her symptoms.  If surgical management needed on this patient she will need to be transferred to [facility], most likely she will need  bariatric surgery re-intervention.   Lucas Petrin, MD

## 2024-03-21 LAB — CBC WITH DIFFERENTIAL/PLATELET
Abs Immature Granulocytes: 0.01 K/uL (ref 0.00–0.07)
Basophils Absolute: 0 K/uL (ref 0.0–0.1)
Basophils Relative: 1 %
Eosinophils Absolute: 0.2 K/uL (ref 0.0–0.5)
Eosinophils Relative: 5 %
HCT: 29.9 % — ABNORMAL LOW (ref 36.0–46.0)
Hemoglobin: 9.7 g/dL — ABNORMAL LOW (ref 12.0–15.0)
Immature Granulocytes: 0 %
Lymphocytes Relative: 39 %
Lymphs Abs: 1.4 K/uL (ref 0.7–4.0)
MCH: 30.5 pg (ref 26.0–34.0)
MCHC: 32.4 g/dL (ref 30.0–36.0)
MCV: 94 fL (ref 80.0–100.0)
Monocytes Absolute: 0.3 K/uL (ref 0.1–1.0)
Monocytes Relative: 7 %
Neutro Abs: 1.8 K/uL (ref 1.7–7.7)
Neutrophils Relative %: 48 %
Platelets: 280 K/uL (ref 150–400)
RBC: 3.18 MIL/uL — ABNORMAL LOW (ref 3.87–5.11)
RDW: 14 % (ref 11.5–15.5)
WBC: 3.6 K/uL — ABNORMAL LOW (ref 4.0–10.5)
nRBC: 0 % (ref 0.0–0.2)

## 2024-03-21 LAB — BASIC METABOLIC PANEL WITH GFR
Anion gap: 8 (ref 5–15)
BUN: 9 mg/dL (ref 6–20)
CO2: 25 mmol/L (ref 22–32)
Calcium: 8 mg/dL — ABNORMAL LOW (ref 8.9–10.3)
Chloride: 106 mmol/L (ref 98–111)
Creatinine, Ser: 0.45 mg/dL (ref 0.44–1.00)
GFR, Estimated: >60 mL/min (ref >60)
Glucose, Bld: 74 mg/dL (ref 70–99)
Potassium: 3.2 mmol/L — ABNORMAL LOW (ref 3.5–5.1)
Sodium: 140 mmol/L (ref 135–145)

## 2024-03-21 LAB — GLUCOSE, CAPILLARY: Glucose-Capillary: 81 mg/dL (ref 70–99)

## 2024-03-21 NOTE — Discharge Summary (Signed)
 Physician Discharge Summary  Maria Fuller FMW:[id number] DOB: [birth year] DOA: 03/19/2024  PCP: Perri Constance Sor, PA-C  Admit date: 03/19/2024 Discharge date: 03/21/2024  Admitted From: Home Disposition:  Home  Recommendations for Outpatient Follow-up:  Follow up with PCP in 1-2 weeks Follow up with [facility] bariatric surgery  Home Health:No  Equipment/Devices:None   Discharge Condition:Stable  CODE STATUS:FULL  Diet recommendation: Soft  Brief/Interim Summary:  38 y.o. female with medical history significant of gastric bypass surgery, incarcerated hernia, multiple small bowel obstruction, chronic abdominal pain, depression with anxiety, GERD, hypothyroidism, anemia, obesity, resolved hypertension, diabetes and OSA after gastric bypass surgery, who presents with nausea, vomiting, abdominal pain.   Patient states that her symptoms started this morning.  She has had 4 times of nonbilious nonbloody vomiting.  Her abdominal pain is located in the central abdomen, constant, aching, 6 out of 10 in severity, radiating to the back and groin areas, not aggravated or alleviated by any known factors.  No fever or chills.  Her last bowel movement was 2 days ago.  No chest pain, cough, SOB.  No symptoms of UTI.  No fever or chills.    Discharge Diagnoses:  Principal Problem:   Small bowel obstruction (HCC) Active Problems:   HTN (hypertension)   Normocytic anemia   Depression with anxiety   Obesity (BMI 30-39.9)  Small bowel obstruction [facility]): Patient presentation is somewhat atypical.  The diagnosis is not entirely clear.  Patient had a diagnostic laparoscopy in [facility] in April of this year with no acute findings.  Specifically no adhesions were noted.  reassuring Gastrografin  study.  Patient had 2 recorded BMs on 11/17.  Abdominal pain improved.  Cleared for surgery for discharge home.  NGT removed.  Patient is advised to follow-up with [facility] bariatric surgery postdischarge.      Discharge Instructions  Discharge Instructions     Diet - low sodium heart healthy   Complete by: As directed    Increase activity slowly   Complete by: As directed       Allergies as of 03/21/2024       Reactions   Cefuroxime Dermatitis   Ceftin [cefuroxime Axetil] Rash   Tolerated cefazolin  before   Penicillins Rash, Other (See Comments)   TOLERATED CEFAZOLIN  BEFORE   Prochlorperazine  Other (See Comments), Anxiety   Panic attack        Medication List     STOP taking these medications    ketoconazole 2 % cream Commonly known as: NIZORAL   sucralfate 1 GM/10ML suspension Commonly known as: Carafate       TAKE these medications    buPROPion  150 MG 24 hr tablet Commonly known as: WELLBUTRIN  XL Take 150 mg by mouth every morning.   clonazePAM 0.5 MG tablet Commonly known as: KLONOPIN Take 0.5 mg by mouth daily.   dicyclomine  10 MG capsule Commonly known as: BENTYL  Take 1 capsule (10 mg total) by mouth 4 (four) times daily -  before meals and at bedtime.   furosemide 20 MG tablet Commonly known as: LASIX Take 1 tablet (20 mg total) by mouth daily as needed.   gabapentin  600 MG tablet Commonly known as: NEURONTIN  Take 600 mg by mouth 3 (three) times daily.   hydrOXYzine 25 MG tablet Commonly known as: ATARAX Take 1 tablet by mouth 3 (three) times daily as needed.   levonorgestrel  20 MCG/24HR IUD Commonly known as: MIRENA  1 each by Intrauterine route once.   methocarbamol 500 MG tablet Commonly known as:  ROBAXIN Take 500 mg by mouth 3 (three) times daily.   nystatin powder Commonly known as: MYCOSTATIN/NYSTOP Apply 1 Application topically as needed.   ondansetron  4 MG disintegrating tablet Commonly known as: ZOFRAN -ODT Take 1 tablet (4 mg total) by mouth every 8 (eight) hours as needed for nausea or vomiting.   ondansetron  4 MG disintegrating tablet Commonly known as: ZOFRAN -ODT Take 1 tablet (4 mg total) by mouth every 8 (eight)  hours as needed.   oxyCODONE -acetaminophen  5-325 MG tablet Commonly known as: Percocet Take 1 tablet by mouth every 6 (six) hours as needed for severe pain (pain score 7-10).   pantoprazole  40 MG tablet Commonly known as: PROTONIX  Take 1 tablet (40 mg total) by mouth 2 (two) times daily before a meal.   polyethylene glycol powder 17 GM/SCOOP powder Commonly known as: GLYCOLAX /MIRALAX  Take 17 g by mouth daily.   potassium chloride  SA 20 MEQ tablet Commonly known as: KLOR-CON  M Take 1 tablet (20 mEq total) by mouth daily as needed.   promethazine  25 MG suppository Commonly known as: PHENERGAN  Place 1 suppository (25 mg total) rectally every 6 (six) hours as needed for nausea or vomiting.   scopolamine  1 MG/3DAYS Commonly known as: TRANSDERM-SCOP 1 patch every three (3) days as needed.   sertraline  100 MG tablet Commonly known as: ZOLOFT  Take 100 mg by mouth 2 (two) times daily.   traZODone  50 MG tablet Commonly known as: DESYREL  Take 50 mg by mouth at bedtime.        Allergies  Allergen Reactions   Cefuroxime Dermatitis   Ceftin [Cefuroxime Axetil] Rash    Tolerated cefazolin  before   Penicillins Rash and Other (See Comments)    TOLERATED CEFAZOLIN  BEFORE   Prochlorperazine  Other (See Comments) and Anxiety    Panic attack    Consultations: Surgery   Procedures/Studies: DG Abd Portable 1V-Small Bowel Obstruction Protocol-initial, 8 hr delay Result Date: 03/20/2024 EXAM: 1 VIEW XRAY OF THE ABDOMEN 03/20/2024 05:15:02 PM COMPARISON: Prior study dated 03/20/2024, 2:11 am. CLINICAL HISTORY: FINDINGS: LINES, TUBES AND DEVICES: NG tube tip superimposed with stomach in the left upper quadrant below the diaphragm. IUD noted. BOWEL: Retained oral contrast in colon and rectosigmoid. Nonobstructive bowel gas pattern. SOFT TISSUES: No opaque urinary calculi. BONES: No acute osseous abnormality. IMPRESSION: 1. No acute findings. 2. No gaseous distention of bowel. Oral contrast  opacifying colon and rectosigmoid. Electronically signed by: Fonda Field MD 03/20/2024 06:07 PM EST RP Workstation: [id number] View Result Date: 03/20/2024 EXAM: 1 VIEW XRAY OF THE ABDOMEN 03/20/2024 02:18:00 AM COMPARISON: CT with IV contrast yesterday at 10:03 PM. CLINICAL HISTORY: Encounter for imaging study to confirm nasogastric (NG) tube placement. FINDINGS: LINES, TUBES AND DEVICES: Nasogastric tube has been inserted, appears adequately intragastric with the tip in the body of the stomach. BOWEL: Nonobstructive bowel gas pattern. There is moderate retained stool in the visualized colon. Mild dilatation of the mid abdominal small bowel is similar to the CT. SOFT TISSUES: No opaque urinary calculi. BONES: No acute osseous abnormality. LIMITATIONS: The pelvis was excluded from the exam. IMPRESSION: 1. NGT tip in the body of the stomach. Electronically signed by: Francis Quam MD 03/20/2024 02:45 AM EST RP Workstation: [id number]   CT ABDOMEN PELVIS W CONTRAST Result Date: 03/19/2024 EXAM: CT ABDOMEN AND PELVIS WITH CONTRAST 03/19/2024 10:07:16 PM TECHNIQUE: CT of the abdomen and pelvis was performed with the administration of 100 mL of iohexol  (OMNIPAQUE ) 300 MG/ML solution. Multiplanar reformatted images are provided for  review. Automated exposure control, iterative reconstruction, and/or weight-based adjustment of the mA/kV was utilized to reduce the radiation dose to as low as reasonably achievable. COMPARISON: CT abdomen and pelvis with IV contrast 02/24/2024 and 02/20/2024. There is a large number of prior CTs, but these are the 2 most recent. CLINICAL HISTORY: Central abdominal pain and back pain, nausea, and vomiting. Prior history of small bowel obstruction. FINDINGS: LOWER CHEST: Scattered linear scarring or atelectasis in the lung bases without infiltrates. Small hiatal hernia. The cardiac size is normal. LIVER: The liver is 19 cm in length and mildly steatotic without mass.  GALLBLADDER AND BILE DUCTS: The gallbladder is absent without bile duct dilatation. SPLEEN: No acute abnormality. PANCREAS: No acute abnormality. ADRENAL GLANDS: There is no adrenal mass. KIDNEYS, URETERS AND BLADDER: There is no renal mass. 1.7 cm Bosniak 1 cyst again noted in the superior pole of the left kidney, Hounsfield density is 12. No follow-up imaging is recommended. There is no urinary stone or hydronephrosis. No perinephric or periureteral stranding. Urinary bladder is unremarkable. GI AND BOWEL: Remote surgical changes of gastric bypass. There is increased stool filling and dilatation of right lower quadrant postsurgical small bowel up to 5 cm on axial 6 mm 3 of series 2, with dilatation of adjacent nonsurgical small bowel up to 3.6 cm. The upstream small bowel is normal in caliber. There is a discrete transition from dilated to collapsed small bowel at an angulated segment in the anterior pelvis to the right, axial images 81 through 84. Findings could indicate a distal small bowel obstruction due to adhesions. As before, the cecum and ascending colon interpose posterior to the liver. Appendix is normal. There is mild wall thickening or underdistention in the ascending colon. Correlate clinically for underlying colitis. The more distal colon is unremarkable. There is moderate retained stool in the transverse colon. PERITONEUM AND RETROPERITONEUM: There is trace perihepatic ascites. No free air. VASCULATURE: Aorta is normal in caliber. LYMPH NODES: No lymphadenopathy. REPRODUCTIVE ORGANS: There is an IUD within the uterus with interval retroversion of the uterus. The ovaries are not enlarged. There is trace fluid in the posterior pelvis to the right. BONES AND SOFT TISSUES: There are chronic L5 pars defects, chronic grade 1 L5-S1 spondylolisthesis, and partial disc collapse. There is mild hip djd and osteopenia. No focal soft tissue abnormality. IMPRESSION: 1. Findings consistent with distal small bowel  obstruction with a discrete transition point in the right anterior pelvis, likely related to adhesions. There is increased stool dilatation of postsurgical and nonsurgical small bowel segments in the right lower quadrant. The upstream small bowel is not dilated. 2. Mild ascending colonic wall thickening versus underdistention; consider colitis as a differential. 3. Moderate retained stool in the transverse colon. 4. Trace perihepatic and pelvic free fluid, likely reactive to bowel pathology. 5. Nonacute chronic findings discussed above. Electronically signed by: Francis Quam MD 03/19/2024 10:33 PM EST RP Workstation: [id number]   CT ABDOMEN PELVIS W CONTRAST Result Date: 02/24/2024 EXAM: CT ABDOMEN AND PELVIS WITH CONTRAST 02/24/2024 11:07:32 AM TECHNIQUE: CT of the abdomen and pelvis was performed with the administration of 100 mL of iohexol  (OMNIPAQUE ) 300 MG/ML solution. Multiplanar reformatted images are provided for review. Automated exposure control, iterative reconstruction, and/or weight-based adjustment of the mA/kV was utilized to reduce the radiation dose to as low as reasonably achievable. COMPARISON: CT 02/20/2024 CLINICAL HISTORY: RLQ abdominal pain. Patient being seen for N/V/D. Seen for same on 10/18. FINDINGS: LOWER CHEST: No acute abnormality. LIVER: The liver  is unremarkable. GALLBLADDER AND BILE DUCTS: Post cholecystectomy. No biliary ductal dilatation. SPLEEN: No acute abnormality. PANCREAS: No acute abnormality. ADRENAL GLANDS: No acute abnormality. KIDNEYS, URETERS AND BLADDER: No stones in the kidneys or ureters. No hydronephrosis. No perinephric or periureteral stranding. Urinary bladder is unremarkable. GI AND BOWEL: Post gastric bypass surgery. No bowel obstruction or inflammation. The cecum is in the right upper quadrant. Appendix is normal. Hepatic flexure lateral to the right hepatic lobe. Left colon normal. Rectosigmoid colon normal. PERITONEUM AND RETROPERITONEUM: No ascites. No  free air. VASCULATURE: Aorta is normal in caliber. LYMPH NODES: No lymphadenopathy. REPRODUCTIVE ORGANS: IUD in expected location in the uterus. No adnexal abnormality. Simple fluid density 3.8 cm cyst of the left ovary. According to the Ovarian-Adnexal Reporting and Data System Ultrasound (O-RADS US ), the finding is consistent with O-RADS US  2 (almost certainly benign) and the recommendation is: for a simple cyst 3-5 cm, premenopausal: no follow-up; postmenopausal: 1-year follow-up. BONES AND SOFT TISSUES: Degenerative spurring of the lumbar spine at L5-S1. No focal soft tissue abnormality. IMPRESSION: 1. No acute findings in the abdomen or pelvis. 2. Post cholecystectomy and post gastric bypass surgery. 3. Simple-appearing 3.8 cm left ovarian cyst. In premenopausal patients, no follow-up is recommended. I Electronically signed by: Norleen Boxer MD 02/24/2024 12:00 PM EDT RP Workstation: [id number]      Subjective:   Discharge Exam: Vitals:   03/21/24 0326 03/21/24 0737  BP: 128/84 135/82  Pulse: 60 (!) 46  Resp: 18 18  Temp: 97.8 F (36.6 C) 97.9 F (36.6 C)  SpO2: 95% 97%   Vitals:   03/20/24 0730 03/20/24 1513 03/21/24 0326 03/21/24 0737  BP: (!) 159/93 133/86 128/84 135/82  Pulse: 62 (!) 52 60 (!) 46  Resp: 18 18 18 18   Temp: 97.6 F (36.4 C) 97.8 F (36.6 C) 97.8 F (36.6 C) 97.9 F (36.6 C)  TempSrc: Oral Oral  Oral  SpO2: 96% 95% 95% 97%  Weight:      Height:        General: Pt is alert, awake, not in acute distress Cardiovascular: RRR, S1/S2 +, no rubs, no gallops Respiratory: CTA bilaterally, no wheezing, no rhonchi Abdominal: Soft, NT, ND, bowel sounds + Extremities: no edema, no cyanosis    The results of significant diagnostics from this hospitalization (including imaging, microbiology, ancillary and laboratory) are listed below for reference.     Microbiology: No results found for this or any previous visit (from the past 240 hours).   Labs: BNP (last 3  results) Recent Labs    01/16/24 1220  BNP 168.4*   Basic Metabolic Panel: Recent Labs  Lab 03/16/24 1127 03/19/24 2003 03/20/24 0554 03/21/24 0832  NA 139 141 141 140  K 3.6 4.0 3.5 3.2*  CL 109 109 109 106  CO2 22 24 23 25   GLUCOSE 80 85 97 74  BUN 12 11 11 9   CREATININE 0.58 0.59 0.50 0.45  CALCIUM 8.4* 8.3* 8.0* 8.0*   Liver Function Tests: Recent Labs  Lab 03/16/24 1127 03/19/24 2003  AST 31 23  ALT 23 23  ALKPHOS 133* 121  BILITOT 0.5 0.5  PROT 6.9 6.5  ALBUMIN 4.3 4.2   Recent Labs  Lab 03/16/24 1127 03/19/24 2003  LIPASE 13 15   No results for input(s): AMMONIA in the last 168 hours. CBC: Recent Labs  Lab 03/16/24 1127 03/19/24 2003 03/20/24 0554 03/21/24 0832  WBC 6.0 5.5 6.1 3.6*  NEUTROABS  --   --   --  1.8  HGB 10.4* 9.9* 10.0* 9.7*  HCT 32.1* 30.1* 30.8* 29.9*  MCV 93.3 91.8 93.3 94.0  PLT 340 323 313 280   Cardiac Enzymes: No results for input(s): CKTOTAL, CKMB, CKMBINDEX, TROPONINI in the last 168 hours. BNP: Invalid input(s): POCBNP CBG: Recent Labs  Lab 03/21/24 0857  GLUCAP 81   D-Dimer No results for input(s): DDIMER in the last 72 hours. Hgb A1c No results for input(s): HGBA1C in the last 72 hours. Lipid Profile No results for input(s): CHOL, HDL, LDLCALC, TRIG, CHOLHDL, LDLDIRECT in the last 72 hours. Thyroid  function studies No results for input(s): TSH, T4TOTAL, T3FREE, THYROIDAB in the last 72 hours.  Invalid input(s): FREET3 Anemia work up No results for input(s): VITAMINB12, FOLATE, FERRITIN, TIBC, IRON, RETICCTPCT in the last 72 hours. Urinalysis    Component Value Date/Time   COLORURINE YELLOW (A) 03/19/2024 2003   APPEARANCEUR CLEAR (A) 03/19/2024 2003   APPEARANCEUR Clear 05/29/2022 1503   LABSPEC 1.019 03/19/2024 2003   LABSPEC 1.033 02/08/2013 0027   PHURINE 5.0 03/19/2024 2003   GLUCOSEU NEGATIVE 03/19/2024 2003   GLUCOSEU >=500 02/08/2013 0027    HGBUR NEGATIVE 03/19/2024 2003   BILIRUBINUR NEGATIVE 03/19/2024 2003   BILIRUBINUR Negative 05/29/2022 1503   BILIRUBINUR Negative 02/08/2013 0027   KETONESUR NEGATIVE 03/19/2024 2003   PROTEINUR NEGATIVE 03/19/2024 2003   UROBILINOGEN 1.0 06/26/2014 1405   NITRITE NEGATIVE 03/19/2024 2003   LEUKOCYTESUR NEGATIVE 03/19/2024 2003   LEUKOCYTESUR Negative 02/08/2013 0027   Sepsis Labs Recent Labs  Lab 03/16/24 1127 03/19/24 2003 03/20/24 0554 03/21/24 0832  WBC 6.0 5.5 6.1 3.6*   Microbiology No results found for this or any previous visit (from the past 240 hours).   Time coordinating discharge: 40 minutes   SIGNED:   Calvin KATHEE Robson, MD  [facility] 03/21/2024, 2:10 PM Pager   If 7PM-7AM, please contact night-coverage

## 2024-03-21 NOTE — Discharge Instructions (Addendum)
-   Advance your diet as tolerated   - Schedule a follow-up appointment with bariatric surgery    Ranjan Sudan, MD with [facility]    [phone]

## 2024-03-21 NOTE — Progress Notes (Signed)
 Discharge instructions reviewed by Ale RN. Patient pushed out by wheelchair with belongings

## 2024-03-21 NOTE — Progress Notes (Signed)
 [facility]- General Surgery  SURGICAL PROGRESS NOTE  Hospital Day(s): 2.   Interval History:  Patient seen and examined. No acute events or new complaints overnight. Small bowel protocol was done yesterday and abdominal x-ray showed contrast in colon and rectosigmoid.  Patient denies bowel movement or flatulence.  Admits feeling nauseous yesterday but no vomiting.   Vital signs in last 24 hours: [min-max] current  Temp:  [97.8 F (36.6 C)-97.9 F (36.6 C)] 97.9 F (36.6 C) (11/17 0737) Pulse Rate:  [46-60] 46 (11/17 0737) Resp:  [18] 18 (11/17 0737) BP: (128-135)/(82-86) 135/82 (11/17 0737) SpO2:  [95 %-97 %] 97 % (11/17 0737)     Height: 6' 0.99 (185.4 cm) Weight: 121.2 kg BMI (Calculated): 35.26   Intake/Output last 2 shifts:  11/16 0701 - 11/17 0700 In: -  Out: 75 [Emesis/NG output:75]   Physical Exam:  Constitutional: alert, cooperative and no distress  Respiratory: breathing non-labored at rest  Cardiovascular: bradycardic and sinus rhythm  Gastrointestinal: soft, mildly tender (generalized) , and non-distended  Labs:     Latest Ref Rng & Units 03/20/2024    5:54 AM 03/19/2024    8:03 PM 03/16/2024   11:27 AM  CBC  WBC 4.0 - 10.5 K/uL 6.1  5.5  6.0   Hemoglobin 12.0 - 15.0 g/dL 89.9  9.9  89.5   Hematocrit 36.0 - 46.0 % 30.8  30.1  32.1   Platelets 150 - 400 K/uL 313  323  340       Latest Ref Rng & Units 03/20/2024    5:54 AM 03/19/2024    8:03 PM 03/16/2024   11:27 AM  CMP  Glucose 70 - 99 mg/dL 97  85  80   BUN 6 - 20 mg/dL 11  11  12    Creatinine 0.44 - 1.00 mg/dL 9.49  9.40  9.41   Sodium 135 - 145 mmol/L 141  141  139   Potassium 3.5 - 5.1 mmol/L 3.5  4.0  3.6   Chloride 98 - 111 mmol/L 109  109  109   CO2 22 - 32 mmol/L 23  24  22    Calcium 8.9 - 10.3 mg/dL 8.0  8.3  8.4   Total Protein 6.5 - 8.1 g/dL  6.5  6.9   Total Bilirubin 0.0 - 1.2 mg/dL  0.5  0.5   Alkaline Phos 38 - 126 U/L  121  133   AST 15 - 41 U/L  23  31   ALT 0 - 44 U/L  23   23     Imaging studies:  EXAM: 1 VIEW XRAY OF THE ABDOMEN 03/20/2024 05:15:02 PM   COMPARISON: Prior study dated 03/20/2024, 2:11 am.   CLINICAL HISTORY:   FINDINGS:   LINES, TUBES AND DEVICES: NG tube tip superimposed with stomach in the left upper quadrant below the diaphragm. IUD noted.   BOWEL: Retained oral contrast in colon and rectosigmoid. Nonobstructive bowel gas pattern.   SOFT TISSUES: No opaque urinary calculi.   BONES: No acute osseous abnormality.   IMPRESSION: 1. No acute findings. 2. No gaseous distention of bowel. Oral contrast opacifying colon and rectosigmoid.   Electronically signed by: Fonda Field MD 03/20/2024 06:07 PM EST RP Workstation: [id number]   Assessment/Plan:  38 y.o. female with small bowel obstruction, complicated by pertinent comorbidities including chronic abdominal pain, diabetes mellitus, hyperlipidemia, obesity, GERD, history of gastric bypass.    - Small bowel study reassuring with contrast seen in rectum. Awaiting on return  of GI function to remove NGT and start clear liquid diet   - Continue NGT low-intermittent  in the meantime   - Continue NPO    - Continue pain management and encourage to ambulate    -- Anabela Crayton Barrientos PA-C

## 2024-04-25 ENCOUNTER — Other Ambulatory Visit: Payer: Self-pay

## 2024-04-25 ENCOUNTER — Emergency Department
Admission: EM | Admit: 2024-04-25 | Discharge: 2024-04-25 | Disposition: A | Discharge status: Home / Self Care | Attending: Emergency Medicine | Admitting: Emergency Medicine

## 2024-04-25 DIAGNOSIS — M5416 Radiculopathy, lumbar region: Secondary | ICD-10-CM | POA: Insufficient documentation

## 2024-04-25 DIAGNOSIS — M549 Dorsalgia, unspecified: Secondary | ICD-10-CM | POA: Diagnosis present

## 2024-04-25 LAB — URINALYSIS, ROUTINE W REFLEX MICROSCOPIC
Bilirubin Urine: NEGATIVE
Glucose, UA: NEGATIVE mg/dL
Hgb urine dipstick: NEGATIVE
Ketones, ur: NEGATIVE mg/dL
Leukocytes,Ua: NEGATIVE
Nitrite: NEGATIVE
Protein, ur: NEGATIVE mg/dL
Specific Gravity, Urine: 1.014 (ref 1.005–1.030)
pH: 6 (ref 5.0–8.0)

## 2024-04-25 MED ORDER — PREDNISONE 10 MG PO TABS: ORAL_TABLET | ORAL | 0 refills | Status: DC

## 2024-04-25 MED ORDER — OXYCODONE HCL 5 MG PO TABS: 5.0000 mg | ORAL_TABLET | Freq: Four times a day (QID) | ORAL | 0 refills | Status: DC | PRN

## 2024-04-25 MED ORDER — KETOROLAC TROMETHAMINE 30 MG/ML IJ SOLN
30.0000 mg | Freq: Once | INTRAMUSCULAR | Status: AC
  Administered 2024-04-25: 30 mg via INTRAMUSCULAR
  Filled 2024-04-25: qty 1

## 2024-04-25 MED ORDER — LIDOCAINE 5 % EX PTCH: 1.0000 | MEDICATED_PATCH | CUTANEOUS | 0 refills | Status: AC

## 2024-04-25 MED ORDER — OXYCODONE HCL 5 MG PO TABS
5.0000 mg | ORAL_TABLET | Freq: Once | ORAL | Status: AC
  Administered 2024-04-25: 5 mg via ORAL
  Filled 2024-04-25: qty 1

## 2024-04-25 NOTE — ED Notes (Signed)
 See triage note  Presents with lower back pain    This pain started couple of days ago Denies any new injury

## 2024-04-25 NOTE — ED Triage Notes (Signed)
 Pt to ED via POV from home. Pt reports lower back pain for the last few days and is now having difficulty urinating. Denies N/V/D or fevers.

## 2024-04-25 NOTE — ED Provider Notes (Signed)
 "  [facility] Provider Note    Event Date/Time   First MD Initiated Contact with Patient 04/25/24 1310     (approximate)   History   Back Pain   HPI  Maria Fuller is a 38 y.o. female   presents to the ED with complaint of right sided back pain and radiation down her right leg.  Patient denies any injury to her back and states that the pain has been there for several days.  She has a history of back pain and currently is taking gabapentin  and methocarbamol  which has not helped with her pain.  Patient has history of hypertension, diabetes type 2, right ovarian cyst, PCOS, urolithiasis, nephrolithiasis, migraines, and small bowel obstruction November 2025.      Physical Exam   Triage Vital Signs: ED Triage Vitals  Encounter Vitals Group     BP 04/25/24 1216 (!) 140/76     Girls Systolic BP Percentile --      Girls Diastolic BP Percentile --      Boys Systolic BP Percentile --      Boys Diastolic BP Percentile --      Pulse Rate 04/25/24 1216 65     Resp 04/25/24 1216 18     Temp 04/25/24 1216 98.5 F (36.9 C)     Temp Source 04/25/24 1216 Oral     SpO2 04/25/24 1216 98 %     Weight --      Height --      Head Circumference --      Peak Flow --      Pain Score 04/25/24 1211 7     Pain Loc --      Pain Education --      Exclude from Growth Chart --     Most recent vital signs: Vitals:   04/25/24 1216  BP: (!) 140/76  Pulse: 65  Resp: 18  Temp: 98.5 F (36.9 C)  SpO2: 98%     General: Awake, no distress.  Tearful. CV:  Good peripheral perfusion.  Resp:  Normal effort.  Abd:  No distention.  Other:  Nontender on palpation of the lumbar spine.  There is moderate tenderness which is also reproducing her pain with palpation of the right SI joint area.  Straight leg raises are negative on the left however the right is approximately 20 degrees which is all she is able to tolerate.  Good muscle strength bilaterally.  Patient is able to  stand and ambulate without any assistance.   ED Results / Procedures / Treatments   Labs (all labs ordered are listed, but only abnormal results are displayed) Labs Reviewed  URINALYSIS, ROUTINE W REFLEX MICROSCOPIC - Abnormal; Notable for the following components:      Result Value   Color, Urine YELLOW (*)    APPearance CLEAR (*)    All other components within normal limits      PROCEDURES:  Critical Care performed:   Procedures   MEDICATIONS ORDERED IN ED: Medications  ketorolac  (TORADOL ) 30 MG/ML injection 30 mg (30 mg Intramuscular Given 04/25/24 1357)  oxyCODONE  (Oxy IR/ROXICODONE ) immediate release tablet 5 mg (5 mg Oral Given 04/25/24 1357)     IMPRESSION / MDM / ASSESSMENT AND PLAN / ED COURSE  I reviewed the triage vital signs and the nursing notes.   Differential diagnosis includes, but is not limited to, urinary tract infection, urolithiasis, muscle skeletal pain, strain, sciatica.  38 year old female presents to the ED with  complaint of back pain with radiation down her right leg without history of injury.  Patient currently has taken methocarbamol  and gabapentin  without any relief of the symptoms.  Urinalysis was negative and patient was made aware.  She was given Toradol  30 mg IM and oxycodone  while in the ED.  Patient was discharged with a prescription for Lidoderm  patches, oxycodone  5 mg and prednisone  6-day taper.  She is to follow-up with her PCP if any continued problems or return to the emergency department if any severe worsening of her symptoms over the holiday weekend.      Patient's presentation is most consistent with acute complicated illness / injury requiring diagnostic workup.  FINAL CLINICAL IMPRESSION(S) / ED DIAGNOSES   Final diagnoses:  Lumbar back pain with radiculopathy affecting right lower extremity     Rx / DC Orders   ED Discharge Orders          Ordered    predniSONE  (DELTASONE ) 10 MG tablet        04/25/24 1403     oxyCODONE  (OXY IR/ROXICODONE ) 5 MG immediate release tablet  Every 6 hours PRN        04/25/24 1403    lidocaine  (LIDODERM ) 5 %  Every 24 hours        04/25/24 1404             Note:  This document was prepared using Dragon voice recognition software and may include unintentional dictation errors.   Maria Shona CROME, PA-C 04/25/24 1541    Arlander Charleston, MD 04/28/24 [phone]  "

## 2024-04-25 NOTE — Discharge Instructions (Signed)
 Continue taking your methocarbamol  and gabapentin .  A prescription for prednisone  tapering dose starting with 6 tablets today and tapering down by 1 tablet each day until completely finished.  A prescription for oxycodone  immediate release was sent to the pharmacy for you to take only as needed be taking your Percocet that was prescribed for you for your stomach pain should not be taken in addition to this medication.  Moist heat or ice to your back as needed and also Lidoderm  patches can be used to your back as well.

## 2024-05-04 ENCOUNTER — Emergency Department
Admission: EM | Admit: 2024-05-04 | Discharge: 2024-05-05 | Disposition: A | Discharge status: Home / Self Care | Source: Home / Self Care | Attending: Emergency Medicine | Admitting: Emergency Medicine

## 2024-05-04 ENCOUNTER — Other Ambulatory Visit: Payer: Self-pay

## 2024-05-04 ENCOUNTER — Emergency Department

## 2024-05-04 DIAGNOSIS — R1084 Generalized abdominal pain: Secondary | ICD-10-CM | POA: Insufficient documentation

## 2024-05-04 LAB — CBC
HCT: 32.7 % — ABNORMAL LOW (ref 36.0–46.0)
Hemoglobin: 10.7 g/dL — ABNORMAL LOW (ref 12.0–15.0)
MCH: 30.5 pg (ref 26.0–34.0)
MCHC: 32.7 g/dL (ref 30.0–36.0)
MCV: 93.2 fL (ref 80.0–100.0)
Platelets: 366 K/uL (ref 150–400)
RBC: 3.51 MIL/uL — ABNORMAL LOW (ref 3.87–5.11)
RDW: 14 % (ref 11.5–15.5)
WBC: 7.3 K/uL (ref 4.0–10.5)
nRBC: 0 % (ref 0.0–0.2)

## 2024-05-04 LAB — POC URINE PREG, ED: Preg Test, Ur: NEGATIVE

## 2024-05-04 LAB — COMPREHENSIVE METABOLIC PANEL WITH GFR
ALT: 23 U/L (ref 0–44)
AST: 20 U/L (ref 15–41)
Albumin: 4.4 g/dL (ref 3.5–5.0)
Alkaline Phosphatase: 115 U/L (ref 38–126)
Anion gap: 9 (ref 5–15)
BUN: 9 mg/dL (ref 6–20)
CO2: 20 mmol/L — ABNORMAL LOW (ref 22–32)
Calcium: 8.4 mg/dL — ABNORMAL LOW (ref 8.9–10.3)
Chloride: 110 mmol/L (ref 98–111)
Creatinine, Ser: 0.5 mg/dL (ref 0.44–1.00)
GFR, Estimated: >60 mL/min
Glucose, Bld: 77 mg/dL (ref 70–99)
Potassium: 4 mmol/L (ref 3.5–5.1)
Sodium: 139 mmol/L (ref 135–145)
Total Bilirubin: 0.3 mg/dL (ref 0.0–1.2)
Total Protein: 7.1 g/dL (ref 6.5–8.1)

## 2024-05-04 LAB — URINALYSIS, ROUTINE W REFLEX MICROSCOPIC
Bilirubin Urine: NEGATIVE
Glucose, UA: NEGATIVE mg/dL
Ketones, ur: NEGATIVE mg/dL
Leukocytes,Ua: NEGATIVE
Nitrite: NEGATIVE
Protein, ur: NEGATIVE mg/dL
Specific Gravity, Urine: 1.018 (ref 1.005–1.030)
pH: 5 (ref 5.0–8.0)

## 2024-05-04 LAB — LIPASE, BLOOD: Lipase: 17 U/L (ref 11–51)

## 2024-05-04 MED ORDER — SODIUM CHLORIDE 0.9 % IV BOLUS
1000.0000 mL | Freq: Once | INTRAVENOUS | Status: AC
  Administered 2024-05-04: 1000 mL via INTRAVENOUS

## 2024-05-04 MED ORDER — IOHEXOL 350 MG/ML SOLN
100.0000 mL | Freq: Once | INTRAVENOUS | Status: AC | PRN
  Administered 2024-05-04: 100 mL via INTRAVENOUS

## 2024-05-04 MED ORDER — DIPHENHYDRAMINE HCL 50 MG/ML IJ SOLN
12.5000 mg | Freq: Once | INTRAMUSCULAR | Status: AC
  Administered 2024-05-04: 12.5 mg via INTRAMUSCULAR
  Filled 2024-05-04: qty 1

## 2024-05-04 MED ORDER — MORPHINE SULFATE (PF) 4 MG/ML IV SOLN
4.0000 mg | Freq: Once | INTRAVENOUS | Status: AC
  Administered 2024-05-04: 4 mg via INTRAVENOUS
  Filled 2024-05-04: qty 1

## 2024-05-04 MED ORDER — ONDANSETRON HCL 4 MG/2ML IJ SOLN
4.0000 mg | Freq: Once | INTRAMUSCULAR | Status: AC
  Administered 2024-05-04: 4 mg via INTRAVENOUS
  Filled 2024-05-04: qty 2

## 2024-05-04 NOTE — ED Provider Notes (Signed)
 "  [facility] Provider Note    Event Date/Time   First MD Initiated Contact with Patient 05/04/24 2159     (approximate)   History   Abdominal Pain   HPI  Maria Fuller is a 38 y.o. female who presents to the emergency department today because of concerns for abdominal pain and possible small bowel obstruction.  Patient states she has history of previous small bowel obstructions.  Per chart review was admitted to the hospital roughly 1 month ago for the same.  She has history of bariatric surgery and hernia mesh.  She states she does have some chronic abdominal pain.  However today she started having more generalized abdominal pain.  She has felt nauseous although has not had any vomiting.  Did have a bowel movement in the morning however is unsure if she has been passing much gas since then.  She denies any fevers.     Physical Exam   Triage Vital Signs: ED Triage Vitals [05/04/24 2149]  Encounter Vitals Group     BP 139/71     Girls Systolic BP Percentile      Girls Diastolic BP Percentile      Boys Systolic BP Percentile      Boys Diastolic BP Percentile      Pulse Rate 66     Resp 18     Temp 98.3 F (36.8 C)     Temp Source Oral     SpO2 98 %     Weight 254 lb (115.2 kg)     Height 6' 1 (1.854 m)     Head Circumference      Peak Flow      Pain Score 7     Pain Loc      Pain Education      Exclude from Growth Chart     Most recent vital signs: Vitals:   05/04/24 2149  BP: 139/71  Pulse: 66  Resp: 18  Temp: 98.3 F (36.8 C)  SpO2: 98%   General: Awake, alert, oriented. CV:  Good peripheral perfusion. Regular rate and rhythm. Resp:  Normal effort. Lungs clear. Abd:  No distention. Diffusely tender to palpation. No rebound. No guarding.  ED Results / Procedures / Treatments   Labs (all labs ordered are listed, but only abnormal results are displayed) Labs Reviewed  CBC - Abnormal; Notable for the following components:       Result Value   RBC 3.51 (*)    Hemoglobin 10.7 (*)    HCT 32.7 (*)    All other components within normal limits  URINALYSIS, ROUTINE W REFLEX MICROSCOPIC - Abnormal; Notable for the following components:   Color, Urine YELLOW (*)    APPearance HAZY (*)    Hgb urine dipstick SMALL (*)    Bacteria, UA RARE (*)    All other components within normal limits  LIPASE, BLOOD  COMPREHENSIVE METABOLIC PANEL WITH GFR  POC URINE PREG, ED     EKG  None   RADIOLOGY CT abd/pel pending at time of sign out.  PROCEDURES:  Critical Care performed: No    MEDICATIONS ORDERED IN ED: Medications  sodium chloride  0.9 % bolus 1,000 mL (has no administration in time range)  ondansetron  (ZOFRAN ) injection 4 mg (has no administration in time range)  morphine  (PF) 4 MG/ML injection 4 mg (has no administration in time range)     IMPRESSION / MDM / ASSESSMENT AND PLAN / ED COURSE  I  reviewed the triage vital signs and the nursing notes.                              Differential diagnosis includes, but is not limited to, SBO, gastroenteritis, intraabdominal infection  Patient's presentation is most consistent with acute presentation with potential threat to life or bodily function.  Patient presented to the emergency department today with concern for abdominal pain and nausea.  Patient with history of small bowel obstruction.  On exam abdomen is soft however diffusely tender to palpation.  Given history of SBO will obtain CT scan.  Will give patient IV fluids and medication to help with symptoms.  CT abd/pel pending at time of sign out   FINAL CLINICAL IMPRESSION(S) / ED DIAGNOSES   Final diagnoses:  Generalized abdominal pain      Note:  This document was prepared using Dragon voice recognition software and may include unintentional dictation errors.    Floy Roberts, MD 05/04/24 2303  "

## 2024-05-04 NOTE — ED Notes (Signed)
 Pt reported itching at IV site after receiving morphine  IV. Provider notified and gave orders for benadryl .

## 2024-05-04 NOTE — ED Triage Notes (Addendum)
 Pt presents for RLQ abdominal pain with nausea and vomiting. Denies known fevers but endorsing chills. Hx small bowel obstructions and states this feels the same. Last BM today- no abnormalities. Took Zofran  and Oxycontin  without relief.

## 2024-05-05 MED ORDER — HYDROMORPHONE HCL 1 MG/ML IJ SOLN
1.0000 mg | INTRAMUSCULAR | Status: DC | PRN
  Administered 2024-05-05: 1 mg via INTRAVENOUS
  Filled 2024-05-05: qty 1

## 2024-05-05 NOTE — Discharge Instructions (Addendum)
 Fortunately your evaluation in the emerged permit did not show any emergency conditions obstructions or other problems that would require hospitalization or surgery at this time.  Please call your bariatric surgeon in the morning for follow-up appointment.  Thank you for choosing us  for your health care today!  Please see your primary doctor this week for a follow up appointment.   If you have any new, worsening, or unexpected symptoms call your doctor right away or come back to the emergency department for reevaluation.  It was my pleasure to care for you today.   Ginnie EDISON Cyrena, MD

## 2024-05-05 NOTE — ED Provider Notes (Addendum)
 Pt reassessed. Exam and symptoms consistent with obstruction, hx of the same. CT with duodenal malrotation. General surgeon Dr Tye paged and notified.  I discussed with Dr. Tye who reviewed imaging, patient history and previous imaging.  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  Unchanged from prior.  He reviewed the CT scan and sees no acute abnormalities.  I discussed these findings with the patient reports his understanding, I see no rationale for admission at this time.  Mildly distended and tender abdomen throughout but with no peritoneal signs.  She understands she will follow-up first thing in the morning with her bariatric surgery clinic in the morning.  She understands to return with any new or worsening symptoms.   Cyrena Mylar, MD 05/05/24 [id number]    Cyrena Mylar, MD 05/05/24 [phone]

## 2024-05-07 ENCOUNTER — Emergency Department

## 2024-05-07 ENCOUNTER — Inpatient Hospital Stay
Admission: EM | Admit: 2024-05-07 | Discharge: 2024-05-11 | DRG: 390 | Disposition: A | Discharge status: Home / Self Care | Attending: Surgery | Admitting: Surgery

## 2024-05-07 ENCOUNTER — Other Ambulatory Visit: Payer: Self-pay

## 2024-05-07 DIAGNOSIS — Z9884 Bariatric surgery status: Secondary | ICD-10-CM | POA: Diagnosis not present

## 2024-05-07 DIAGNOSIS — Z803 Family history of malignant neoplasm of breast: Secondary | ICD-10-CM | POA: Diagnosis not present

## 2024-05-07 DIAGNOSIS — K56609 Unspecified intestinal obstruction, unspecified as to partial versus complete obstruction: Principal | ICD-10-CM | POA: Diagnosis present

## 2024-05-07 DIAGNOSIS — I10 Essential (primary) hypertension: Secondary | ICD-10-CM | POA: Diagnosis present

## 2024-05-07 DIAGNOSIS — Z975 Presence of (intrauterine) contraceptive device: Secondary | ICD-10-CM | POA: Diagnosis not present

## 2024-05-07 DIAGNOSIS — Z833 Family history of diabetes mellitus: Secondary | ICD-10-CM

## 2024-05-07 DIAGNOSIS — G8929 Other chronic pain: Secondary | ICD-10-CM | POA: Diagnosis present

## 2024-05-07 DIAGNOSIS — Z881 Allergy status to other antibiotic agents status: Secondary | ICD-10-CM

## 2024-05-07 DIAGNOSIS — K56601 Complete intestinal obstruction, unspecified as to cause: Secondary | ICD-10-CM | POA: Diagnosis present

## 2024-05-07 DIAGNOSIS — Z6833 Body mass index (BMI) 33.0-33.9, adult: Secondary | ICD-10-CM

## 2024-05-07 DIAGNOSIS — Z880 Allergy status to penicillin: Secondary | ICD-10-CM | POA: Diagnosis not present

## 2024-05-07 DIAGNOSIS — Z888 Allergy status to other drugs, medicaments and biological substances status: Secondary | ICD-10-CM | POA: Diagnosis not present

## 2024-05-07 DIAGNOSIS — Z818 Family history of other mental and behavioral disorders: Secondary | ICD-10-CM | POA: Diagnosis not present

## 2024-05-07 DIAGNOSIS — Z87442 Personal history of urinary calculi: Secondary | ICD-10-CM

## 2024-05-07 DIAGNOSIS — E119 Type 2 diabetes mellitus without complications: Secondary | ICD-10-CM | POA: Diagnosis present

## 2024-05-07 DIAGNOSIS — Z8052 Family history of malignant neoplasm of bladder: Secondary | ICD-10-CM

## 2024-05-07 DIAGNOSIS — Z8661 Personal history of infections of the central nervous system: Secondary | ICD-10-CM | POA: Diagnosis not present

## 2024-05-07 DIAGNOSIS — E7800 Pure hypercholesterolemia, unspecified: Secondary | ICD-10-CM | POA: Diagnosis present

## 2024-05-07 DIAGNOSIS — E669 Obesity, unspecified: Secondary | ICD-10-CM | POA: Diagnosis present

## 2024-05-07 DIAGNOSIS — Z79899 Other long term (current) drug therapy: Secondary | ICD-10-CM | POA: Diagnosis not present

## 2024-05-07 DIAGNOSIS — K219 Gastro-esophageal reflux disease without esophagitis: Secondary | ICD-10-CM | POA: Diagnosis present

## 2024-05-07 DIAGNOSIS — Z9049 Acquired absence of other specified parts of digestive tract: Secondary | ICD-10-CM

## 2024-05-07 LAB — COMPREHENSIVE METABOLIC PANEL WITH GFR
ALT: 24 U/L (ref 0–44)
AST: 22 U/L (ref 15–41)
Albumin: 4.2 g/dL (ref 3.5–5.0)
Alkaline Phosphatase: 113 U/L (ref 38–126)
Anion gap: 10 (ref 5–15)
BUN: 10 mg/dL (ref 6–20)
CO2: 23 mmol/L (ref 22–32)
Calcium: 7.7 mg/dL — ABNORMAL LOW (ref 8.9–10.3)
Chloride: 108 mmol/L (ref 98–111)
Creatinine, Ser: 0.49 mg/dL (ref 0.44–1.00)
GFR, Estimated: >60 mL/min
Glucose, Bld: 77 mg/dL (ref 70–99)
Potassium: 3.5 mmol/L (ref 3.5–5.1)
Sodium: 140 mmol/L (ref 135–145)
Total Bilirubin: 0.4 mg/dL (ref 0.0–1.2)
Total Protein: 6.8 g/dL (ref 6.5–8.1)

## 2024-05-07 LAB — CBC
HCT: 31.2 % — ABNORMAL LOW (ref 36.0–46.0)
Hemoglobin: 10.1 g/dL — ABNORMAL LOW (ref 12.0–15.0)
MCH: 30 pg (ref 26.0–34.0)
MCHC: 32.4 g/dL (ref 30.0–36.0)
MCV: 92.6 fL (ref 80.0–100.0)
Platelets: 333 K/uL (ref 150–400)
RBC: 3.37 MIL/uL — ABNORMAL LOW (ref 3.87–5.11)
RDW: 14.2 % (ref 11.5–15.5)
WBC: 7.2 K/uL (ref 4.0–10.5)
nRBC: 0 % (ref 0.0–0.2)

## 2024-05-07 LAB — LIPASE, BLOOD: Lipase: 22 U/L (ref 11–51)

## 2024-05-07 LAB — URINALYSIS, ROUTINE W REFLEX MICROSCOPIC
Bilirubin Urine: NEGATIVE
Glucose, UA: NEGATIVE mg/dL
Hgb urine dipstick: NEGATIVE
Ketones, ur: NEGATIVE mg/dL
Leukocytes,Ua: NEGATIVE
Nitrite: NEGATIVE
Protein, ur: NEGATIVE mg/dL
Specific Gravity, Urine: 1.01 (ref 1.005–1.030)
pH: 5 (ref 5.0–8.0)

## 2024-05-07 LAB — PREGNANCY, URINE: Preg Test, Ur: NEGATIVE

## 2024-05-07 LAB — POC URINE PREG, ED: Preg Test, Ur: NEGATIVE

## 2024-05-07 MED ORDER — PROMETHAZINE HCL 25 MG RE SUPP
25.0000 mg | Freq: Four times a day (QID) | RECTAL | Status: DC | PRN
  Filled 2024-05-07: qty 1

## 2024-05-07 MED ORDER — LACTATED RINGERS IV BOLUS
1000.0000 mL | Freq: Once | INTRAVENOUS | Status: AC
  Administered 2024-05-07: 1000 mL via INTRAVENOUS

## 2024-05-07 MED ORDER — SCOPOLAMINE 1 MG/3DAYS TD PT72: 1.0000 | MEDICATED_PATCH | TRANSDERMAL | Status: DC | PRN

## 2024-05-07 MED ORDER — MORPHINE SULFATE (PF) 4 MG/ML IV SOLN
8.0000 mg | Freq: Once | INTRAVENOUS | Status: AC
  Administered 2024-05-07: 8 mg via INTRAVENOUS
  Filled 2024-05-07: qty 2

## 2024-05-07 MED ORDER — SERTRALINE HCL 50 MG PO TABS: 100.0000 mg | ORAL_TABLET | Freq: Two times a day (BID) | ORAL | Status: DC

## 2024-05-07 MED ORDER — MORPHINE SULFATE (PF) 2 MG/ML IV SOLN
2.0000 mg | INTRAVENOUS | Status: DC | PRN
  Administered 2024-05-08 – 2024-05-10 (×8): 2 mg via INTRAVENOUS
  Filled 2024-05-07 (×8): qty 1

## 2024-05-07 MED ORDER — ACETAMINOPHEN 325 MG PO TABS: 650.0000 mg | ORAL_TABLET | Freq: Three times a day (TID) | ORAL | Status: DC | PRN

## 2024-05-07 MED ORDER — SODIUM CHLORIDE 0.9 % IV SOLN: INTRAVENOUS | Status: AC

## 2024-05-07 MED ORDER — IOHEXOL 300 MG/ML  SOLN
100.0000 mL | Freq: Once | INTRAMUSCULAR | Status: AC | PRN
  Administered 2024-05-07: 100 mL via INTRAVENOUS

## 2024-05-07 MED ORDER — TRAMADOL HCL 50 MG PO TABS
50.0000 mg | ORAL_TABLET | Freq: Four times a day (QID) | ORAL | Status: DC | PRN
  Administered 2024-05-09 – 2024-05-11 (×2): 50 mg via ORAL
  Filled 2024-05-07 (×2): qty 1

## 2024-05-07 MED ORDER — PANTOPRAZOLE SODIUM 40 MG PO TBEC
40.0000 mg | DELAYED_RELEASE_TABLET | Freq: Two times a day (BID) | ORAL | Status: DC
  Administered 2024-05-08 – 2024-05-11 (×7): 40 mg via ORAL
  Filled 2024-05-07 (×7): qty 1

## 2024-05-07 MED ORDER — ENOXAPARIN SODIUM 60 MG/0.6ML IJ SOSY
0.5000 mg/kg | PREFILLED_SYRINGE | INTRAMUSCULAR | Status: DC
  Administered 2024-05-09 – 2024-05-11 (×3): 57.5 mg via SUBCUTANEOUS
  Filled 2024-05-07 (×4): qty 0.6

## 2024-05-07 MED ORDER — FUROSEMIDE 20 MG PO TABS: 20.0000 mg | ORAL_TABLET | Freq: Every day | ORAL | Status: DC | PRN

## 2024-05-07 MED ORDER — DIATRIZOATE MEGLUMINE & SODIUM 66-10 % PO SOLN
90.0000 mL | Freq: Once | ORAL | Status: AC
  Administered 2024-05-07: 90 mL via NASOGASTRIC

## 2024-05-07 MED ORDER — CLONAZEPAM 0.5 MG PO TABS
0.5000 mg | ORAL_TABLET | Freq: Every day | ORAL | Status: DC
  Administered 2024-05-07 – 2024-05-11 (×5): 0.5 mg via ORAL
  Filled 2024-05-07 (×5): qty 1

## 2024-05-07 MED ORDER — HYDROXYZINE HCL 25 MG PO TABS
25.0000 mg | ORAL_TABLET | Freq: Three times a day (TID) | ORAL | Status: DC | PRN
  Administered 2024-05-08: 25 mg via ORAL
  Filled 2024-05-07: qty 1

## 2024-05-07 MED ORDER — ONDANSETRON 4 MG PO TBDP: 4.0000 mg | ORAL_TABLET | Freq: Four times a day (QID) | ORAL | Status: DC | PRN

## 2024-05-07 MED ORDER — GABAPENTIN 300 MG PO CAPS
600.0000 mg | ORAL_CAPSULE | Freq: Three times a day (TID) | ORAL | Status: DC
  Administered 2024-05-08 – 2024-05-11 (×10): 600 mg via ORAL
  Filled 2024-05-07 (×10): qty 2

## 2024-05-07 MED ORDER — ONDANSETRON HCL 4 MG/2ML IJ SOLN
4.0000 mg | Freq: Four times a day (QID) | INTRAMUSCULAR | Status: DC | PRN
  Administered 2024-05-08 – 2024-05-09 (×3): 4 mg via INTRAVENOUS
  Filled 2024-05-07 (×3): qty 2

## 2024-05-07 MED ORDER — HYDROCODONE-ACETAMINOPHEN 5-325 MG PO TABS: 1.0000 | ORAL_TABLET | Freq: Three times a day (TID) | ORAL | Status: DC | PRN

## 2024-05-07 MED ORDER — ONDANSETRON HCL 4 MG/2ML IJ SOLN
4.0000 mg | Freq: Once | INTRAMUSCULAR | Status: AC
  Administered 2024-05-07: 4 mg via INTRAVENOUS
  Filled 2024-05-07: qty 2

## 2024-05-07 MED ORDER — DIPHENHYDRAMINE HCL 50 MG/ML IJ SOLN
25.0000 mg | Freq: Once | INTRAMUSCULAR | Status: AC
  Administered 2024-05-07: 25 mg via INTRAVENOUS
  Filled 2024-05-07: qty 1

## 2024-05-07 MED ORDER — OXYCODONE-ACETAMINOPHEN 5-325 MG PO TABS
1.0000 | ORAL_TABLET | Freq: Four times a day (QID) | ORAL | Status: DC | PRN
  Administered 2024-05-08 – 2024-05-11 (×8): 1 via ORAL
  Filled 2024-05-07 (×8): qty 1

## 2024-05-07 MED ORDER — METHOCARBAMOL 500 MG PO TABS
500.0000 mg | ORAL_TABLET | Freq: Three times a day (TID) | ORAL | Status: DC
  Administered 2024-05-08 – 2024-05-11 (×10): 500 mg via ORAL
  Filled 2024-05-07 (×10): qty 1

## 2024-05-07 MED ORDER — DOCUSATE SODIUM 100 MG PO CAPS: 100.0000 mg | ORAL_CAPSULE | Freq: Two times a day (BID) | ORAL | Status: DC | PRN

## 2024-05-07 MED ORDER — BUPROPION HCL ER (XL) 150 MG PO TB24
150.0000 mg | ORAL_TABLET | ORAL | Status: DC
  Administered 2024-05-08: 150 mg via ORAL
  Filled 2024-05-07: qty 1

## 2024-05-07 MED ORDER — DIATRIZOATE MEGLUMINE & SODIUM 66-10 % PO SOLN: 90.0000 mL | Freq: Once | ORAL | Status: DC

## 2024-05-07 MED ORDER — TRAZODONE HCL 50 MG PO TABS
50.0000 mg | ORAL_TABLET | Freq: Every day | ORAL | Status: DC
  Administered 2024-05-08 – 2024-05-10 (×4): 50 mg via ORAL
  Filled 2024-05-07 (×4): qty 1

## 2024-05-07 NOTE — ED Notes (Signed)
 Instilled

## 2024-05-07 NOTE — Progress Notes (Signed)
 PHARMACIST - PHYSICIAN COMMUNICATION  CONCERNING:  Enoxaparin  (Lovenox ) for DVT Prophylaxis    RECOMMENDATION: Patient was prescribed enoxaprin 40mg  q24 hours for VTE prophylaxis.   Filed Weights   05/07/24 1642  Weight: 115.2 kg (253 lb 15.5 oz)    Body mass index is 33.51 kg/m.  Estimated Creatinine Clearance: 136.1 mL/min (by C-G formula based on SCr of 0.49 mg/dL).   Based on [facility] policy patient is candidate for enoxaparin  0.5mg /kg TBW SQ every 24 hours based on BMI being >30.  DESCRIPTION: Pharmacy has adjusted enoxaparin  dose per [facility] policy.  Patient is now receiving enoxaparin  57.5 mg every 24 hours    Damien Napoleon, PharmD Clinical Pharmacist  05/07/2024 7:40 PM

## 2024-05-07 NOTE — ED Provider Notes (Signed)
 "  [facility] Provider Note    Event Date/Time   First MD Initiated Contact with Patient 05/07/24 1708     (approximate)   History   Abdominal Pain   HPI  Maria Fuller is a 39 y.o. female who presents to the ED for evaluation of Abdominal Pain   Review medical DC summary from November where she was admitted 2 days for medical management of SBO.  History of gastric bypass, chronic abdominal pain and multiple surgeries.  13 CT abdomens last year  Patient presents to the ED for evaluation of worsening abdominal pain, nausea and vomiting.  Last bowel movement was yesterday morning  Physical Exam   Triage Vital Signs: ED Triage Vitals  Encounter Vitals Group     BP 05/07/24 1645 (!) 144/80     Girls Systolic BP Percentile --      Girls Diastolic BP Percentile --      Boys Systolic BP Percentile --      Boys Diastolic BP Percentile --      Pulse Rate 05/07/24 1645 (!) 56     Resp 05/07/24 1645 20     Temp 05/07/24 1645 98.4 F (36.9 C)     Temp Source 05/07/24 1645 Oral     SpO2 05/07/24 1645 100 %     Weight 05/07/24 1642 253 lb 15.5 oz (115.2 kg)     Height 05/07/24 1642 6' 1 (1.854 m)     Head Circumference --      Peak Flow --      Pain Score 05/07/24 1641 8     Pain Loc --      Pain Education --      Exclude from Growth Chart --     Most recent vital signs: Vitals:   05/07/24 1645  BP: (!) 144/80  Pulse: (!) 56  Resp: 20  Temp: 98.4 F (36.9 C)  SpO2: 100%    General: Awake, no distress.  CV:  Good peripheral perfusion.  Resp:  Normal effort.  Abd:  No distention.  Mild tenderness throughout the right sided and lower abdomen, no peritoneal features MSK:  No deformity noted.  Neuro:  No focal deficits appreciated. Other:     ED Results / Procedures / Treatments   Labs (all labs ordered are listed, but only abnormal results are displayed) Labs Reviewed  COMPREHENSIVE METABOLIC PANEL WITH GFR - Abnormal; Notable for the  following components:      Result Value   Calcium 7.7 (*)    All other components within normal limits  CBC - Abnormal; Notable for the following components:   RBC 3.37 (*)    Hemoglobin 10.1 (*)    HCT 31.2 (*)    All other components within normal limits  URINALYSIS, ROUTINE W REFLEX MICROSCOPIC - Abnormal; Notable for the following components:   Color, Urine YELLOW (*)    APPearance CLEAR (*)    All other components within normal limits  LIPASE, BLOOD  PREGNANCY, URINE  POC URINE PREG, ED    EKG   RADIOLOGY CT abdomen/pelvis interpreted by me with signs of SBO  Official radiology report(s): CT ABDOMEN PELVIS W CONTRAST Addendum Date: 05/07/2024 ADDENDUM REPORT: 05/07/2024 18:30 ADDENDUM: Critical Value/emergent results were called by telephone at the time of interpretation on 05/07/2024 at 6:30 pm to provider [facility] , who verbally acknowledged these results. Electronically Signed   By: Leita Birmingham M.D.   On: 05/07/2024 18:30   Result  Date: 05/07/2024 CLINICAL DATA:  Evaluate small bowel obstruction. Worsening pain with nausea and vomiting. Gastric bypass 03/29/2024. EXAM: CT ABDOMEN AND PELVIS WITH CONTRAST TECHNIQUE: Multidetector CT imaging of the abdomen and pelvis was performed using the standard protocol following bolus administration of intravenous contrast. RADIATION DOSE REDUCTION: This exam was performed according to the departmental dose-optimization program which includes automated exposure control, adjustment of the mA and/or kV according to patient size and/or use of iterative reconstruction technique. CONTRAST:  OMNIPAQUE  IOHEXOL  300 MG/ML  SOLN COMPARISON:  05/04/2024. FINDINGS: Lower chest: Atelectasis is present at the lung bases. Hepatobiliary: No focal liver abnormality is seen. Status post cholecystectomy. No biliary dilatation. Pancreas: Unremarkable. No pancreatic ductal dilatation or surrounding inflammatory changes. Spleen: Normal in size without focal  abnormality. Adrenals/Urinary Tract: The adrenal glands are within normal limits. The kidneys enhance symmetrically. There is a cyst in the mid left kidney. A punctate renal calculus is noted in the right kidney. No hydronephrosis bilaterally. The bladder is unremarkable. Stomach/Bowel: Gastric bypass surgical changes are noted. No bowel obstruction, free air, or pneumatosis is seen. Appendix is not seen. There is a patulous loop of small bowel in the anterior abdomen with a associated anastomotic sites. The loop of small bowel distal to this appears increasingly distended from the prior exam measuring 5.8 cm with a transition point at the distal anastomotic site, axial image 39. Vascular/Lymphatic: No significant vascular findings are present. No enlarged abdominal or pelvic lymph nodes. Reproductive: An IUD is noted in the uterus.  No adnexal mass. Other: Small amount of free fluid is noted in the cul-de-sac. 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 No acute osseous abnormality is seen. IMPRESSION: 1. Status post gastric surgery with small bowel resection and multiple anastomotic sites. There is a distended loop of small bowel with debris fluid levels and transition point at a distal anastomotic site, best seen on axial image 39, concerning for small bowel obstruction. 2. Right renal calculus. Electronically Signed: By: Leita Birmingham M.D. On: 05/07/2024 18:23    PROCEDURES and INTERVENTIONS:  Procedures  Medications  diatrizoate  meglumine -sodium (GASTROGRAFIN ) 66-10 % solution 90 mL (has no administration in time range)  morphine  (PF) 4 MG/ML injection 8 mg (8 mg Intravenous Given 05/07/24 1738)  ondansetron  (ZOFRAN ) injection 4 mg (4 mg Intravenous Given 05/07/24 1738)  lactated ringers  bolus 1,000 mL (1,000 mLs Intravenous New Bag/Given 05/07/24 1739)  diphenhydrAMINE  (BENADRYL ) injection 25 mg (25 mg Intravenous Given 05/07/24 1745)  iohexol  (OMNIPAQUE ) 300 MG/ML  solution 100 mL (100 mLs Intravenous Contrast Given 05/07/24 1800)     IMPRESSION / MDM / ASSESSMENT AND PLAN / ED COURSE  I reviewed the triage vital signs and the nursing notes.  Differential diagnosis includes, but is not limited to, SBO, ileus, chronic pain, drug-seeking, constipation  {Patient presents with symptoms of an acute illness or injury that is potentially life-threatening.  Patient with multiple abdominal surgeries and SBO's presents with signs of an additional SBO requiring surgical admission.  Localized tenderness without peritoneal features.  Blood work without leukocytosis or significant metabolic derangements.  Negative UA.  CT concerning for SBO, consulted surgery who recommends NG tube and admission.  Clinical Course as of 05/07/24 1847  Sat May 07, 2024  1730 I discussed with the patient her high number of CT scans and we discussed radiation risks discussed risks and benefits she would like to go ahead and get another CT today to look for SBO [DS]  1830 Call from rads, SBO at anastomotic site.  [DS]  1832 Page surgery [DS]  8164 I consult with Dr. Tye who recommends NG tube placement, he will evaluate for anticipated admission to his service [DS]  1843 Updated patient of this and she is agreeable [DS]    Clinical Course User Index [DS] Claudene Rover, MD     FINAL CLINICAL IMPRESSION(S) / ED DIAGNOSES   Final diagnoses:  Small bowel obstruction (HCC)     Rx / DC Orders   ED Discharge Orders     None        Note:  This document was prepared using Dragon voice recognition software and may include unintentional dictation errors.   Claudene Rover, MD 05/07/24 [phone]  "

## 2024-05-07 NOTE — ED Triage Notes (Signed)
 Pt to ED for generalized abdominal pain since 12/31 (seen here for same on 12/31), worse since today. Hx SBO in 11/25 and gastric bypass. Pt in NAD, skin dry. Pain is 8/10 and sharp. Endorses N/V.

## 2024-05-07 NOTE — H&P (Signed)
 Subjective:   CC: SBO  HPI:  Maria Fuller is a 39 y.o. female who is consulted by Claudene for evaluation of above cc.  Chronic issues, recent exacerbation a few days ago.  Last BM yesterday.     Past Medical History:  has a past medical history of Anemia, Bradycardia, Depression, Diabetes mellitus without complication (HCC), Headache, High cholesterol, History of kidney stones, Hypertension, Incarcerated incisional hernia (07/27/2021), Low BP, Meningitis (2015), Partial small bowel obstruction (HCC) (08/04/2019), PCOS (polycystic ovarian syndrome), Preterm labor, Recurrent ventral incisional hernia, Sleep apnea, Small bowel obstruction (HCC), Syncope, and Vertigo (couple years ago).  Past Surgical History:  has a past surgical history that includes Cholecystectomy; Cesarean section; Tonsillectomy; Gastric bypass (04/2015); Cesarean section (N/A, 05/07/2016); Ureteroscopy with holmium laser lithotripsy (Right, 09/19/2016); Cystoscopy with stent placement (Right, 09/19/2016); LEFT HEART CATH AND CORONARY ANGIOGRAPHY (Left, 04/07/2018); Laparoscopic ovarian cystectomy (Right, 09/28/2018); Flexible sigmoidoscopy (N/A, 07/28/2019); small bowel obstruction (07/2019); Laparoscopic unilateral salpingo oophorectomy (Right, 07/25/2021); XI robotic assisted ventral hernia (N/A, 07/27/2021); Cystoscopy with stent placement (Left, 04/18/2022); Cystoscopy/ureteroscopy/holmium laser/stent placement (Left, 05/08/2022); Extracorporeal shock wave lithotripsy (Left, 05/15/2022); Ventral hernia repair; XI robotic assisted ventral hernia (N/A, 07/07/2022); Insertion of mesh (N/A, 07/07/2022); Esophagogastroduodenoscopy (egd) with propofol  (N/A, 11/23/2022); Esophagogastroduodenoscopy (egd) with propofol  (N/A, 11/26/2022); biopsy (11/26/2022); Tonsilectomy, adenoidectomy, bilateral myringotomy and tubes; Diagnostic laparoscopy; Colon surgery; Oophorectomy; Hernia repair; Colonoscopy with propofol  (N/A, 03/16/2023); and  polypectomy (03/16/2023).  Family History: family history includes Bladder Cancer in her father; Breast cancer in her paternal grandmother; Depression in her father; Diabetes in her father, paternal grandfather, and paternal grandmother; Endometriosis in her sister; Polycystic ovary syndrome in her mother and sister.  Social History:  reports that she has never smoked. She has never been exposed to tobacco smoke. She has never used smokeless tobacco. She reports that she does not drink alcohol and does not use drugs.  Current Medications:  Prior to Admission medications  Medication Sig Start Date End Date Taking? Authorizing Provider  buPROPion  (WELLBUTRIN  XL) 150 MG 24 hr tablet Take 150 mg by mouth every morning. 06/17/21   [provider]  clonazePAM  (KLONOPIN ) 0.5 MG tablet Take 0.5 mg by mouth daily.    [provider]  dicyclomine  (BENTYL ) 10 MG capsule Take 1 capsule (10 mg total) by mouth 4 (four) times daily -  before meals and at bedtime. 08/10/23   Menshew, Candida LULLA Kings, PA-C  furosemide  (LASIX ) 20 MG tablet Take 1 tablet (20 mg total) by mouth daily as needed. 02/22/24   Gollan, Timothy J, MD  gabapentin  (NEURONTIN ) 600 MG tablet Take 600 mg by mouth 3 (three) times daily. 02/20/24   [provider]  hydrOXYzine  (ATARAX ) 25 MG tablet Take 1 tablet by mouth 3 (three) times daily as needed. 12/17/22   [provider]  levonorgestrel  (MIRENA ) 20 MCG/24HR IUD 1 each by Intrauterine route once.    [provider]  methocarbamol  (ROBAXIN ) 500 MG tablet Take 500 mg by mouth 3 (three) times daily.    [provider]  nystatin (MYCOSTATIN/NYSTOP) powder Apply 1 Application topically as needed. 02/12/24 02/11/25  [provider]  ondansetron  (ZOFRAN -ODT) 4 MG disintegrating tablet Take 1 tablet (4 mg total) by mouth every 8 (eight) hours as needed for nausea or vomiting. 08/10/23   Menshew, Candida LULLA Kings, PA-C  ondansetron  (ZOFRAN -ODT)  4 MG disintegrating tablet Take 1 tablet (4 mg total) by mouth every 8 (eight) hours as needed. 09/12/23   Margrette, Myah A, PA-C  oxyCODONE  (  OXY IR/ROXICODONE ) 5 MG immediate release tablet Take 1 tablet (5 mg total) by mouth every 6 (six) hours as needed. 04/25/24   Saunders Shona CROME, PA-C  oxyCODONE -acetaminophen  (PERCOCET) 5-325 MG tablet Take 1 tablet by mouth every 6 (six) hours as needed for severe pain (pain score 7-10). 03/16/24   Bradler, Evan K, MD  pantoprazole  (PROTONIX ) 40 MG tablet Take 1 tablet (40 mg total) by mouth 2 (two) times daily before a meal. 11/26/22   Marsa Edelman, DO  polyethylene glycol powder (GLYCOLAX /MIRALAX ) 17 GM/SCOOP powder Take 17 g by mouth daily. 06/23/23   Dorothyann Drivers, MD  potassium chloride  SA (KLOR-CON  M) 20 MEQ tablet Take 1 tablet (20 mEq total) by mouth daily as needed. 02/22/24   Gollan, Timothy J, MD  predniSONE  (DELTASONE ) 10 MG tablet Take 6 tablets  today, on day 2 take 5 tablets, day 3 take 4 tablets, day 4 take 3 tablets, day 5 take  2 tablets and 1 tablet the last day 04/25/24   Summers, Rhonda L, PA-C  promethazine  (PHENERGAN ) 25 MG suppository Place 1 suppository (25 mg total) rectally every 6 (six) hours as needed for nausea or vomiting. 11/17/23   Bradler, Evan K, MD  scopolamine  (TRANSDERM-SCOP) 1 MG/3DAYS 1 patch every three (3) days as needed. 08/11/22   [provider]  sertraline  (ZOLOFT ) 100 MG tablet Take 100 mg by mouth 2 (two) times daily.    [provider]  traZODone  (DESYREL ) 50 MG tablet Take 50 mg by mouth at bedtime.  09/06/18   [provider]    Allergies:  Allergies as of 05/07/2024 - Review Complete 05/07/2024  Allergen Reaction Noted   Cefuroxime Dermatitis 02/22/2024   Ceftin [cefuroxime axetil] Rash 03/03/2018   Penicillins Rash and Other (See Comments) 06/26/2014   Prochlorperazine  Other (See Comments) and Anxiety 02/16/2022    ROS:  Pertinent positives and negatives noted in HPI    Objective:     BP (!) 144/80 (BP Location: Left Arm)   Pulse (!) 56   Temp 98.4 F (36.9 C) (Oral)   Resp 20   Ht 6' 1 (1.854 m)   Wt 115.2 kg   SpO2 100%   BMI 33.51 kg/m    Constitutional :  alert, cooperative, appears stated age, and no distress  Respiratory:  Clear to auscultation bilaterally  Cardiovascular:  Regular rate and rhythm  Gastrointestinal: Soft, no guarding TTP periumbilical region.   Skin: Cool and moist  Psychiatric: Normal affect, non-agitated, not confused       LABS:     Latest Ref Rng & Units 05/07/2024    4:45 PM 05/04/2024    9:52 PM 03/21/2024    8:32 AM  CMP  Glucose 70 - 99 mg/dL 77  77  74   BUN 6 - 20 mg/dL 10  9  9    Creatinine 0.44 - 1.00 mg/dL 9.50  9.49  9.54   Sodium 135 - 145 mmol/L 140  139  140   Potassium 3.5 - 5.1 mmol/L 3.5  4.0  3.2   Chloride 98 - 111 mmol/L 108  110  106   CO2 22 - 32 mmol/L 23  20  25    Calcium 8.9 - 10.3 mg/dL 7.7  8.4  8.0   Total Protein 6.5 - 8.1 g/dL 6.8  7.1    Total Bilirubin 0.0 - 1.2 mg/dL 0.4  0.3    Alkaline Phos 38 - 126 U/L 113  115    AST 15 -  41 U/L 22  20    ALT 0 - 44 U/L 24  23        Latest Ref Rng & Units 05/07/2024    4:45 PM 05/04/2024    9:52 PM 03/21/2024    8:32 AM  CBC  WBC 4.0 - 10.5 K/uL 7.2  7.3  3.6   Hemoglobin 12.0 - 15.0 g/dL 89.8  89.2  9.7   Hematocrit 36.0 - 46.0 % 31.2  32.7  29.9   Platelets 150 - 400 K/uL 333  366  280      RADS: CLINICAL DATA:  Evaluate small bowel obstruction. Worsening pain with nausea and vomiting. Gastric bypass 03/29/2024.   EXAM: CT ABDOMEN AND PELVIS WITH CONTRAST   TECHNIQUE: Multidetector CT imaging of the abdomen and pelvis was performed using the standard protocol following bolus administration of intravenous contrast.   RADIATION DOSE REDUCTION: This exam was performed according to the departmental dose-optimization program which includes automated exposure control, adjustment of the mA and/or kV according to patient  size and/or use of iterative reconstruction technique.   CONTRAST:  OMNIPAQUE  IOHEXOL  300 MG/ML  SOLN   COMPARISON:  05/04/2024.   FINDINGS: Lower chest: Atelectasis is present at the lung bases.   Hepatobiliary: No focal liver abnormality is seen. Status post cholecystectomy. No biliary dilatation.   Pancreas: Unremarkable. No pancreatic ductal dilatation or surrounding inflammatory changes.   Spleen: Normal in size without focal abnormality.   Adrenals/Urinary Tract: The adrenal glands are within normal limits. The kidneys enhance symmetrically. There is a cyst in the mid left kidney. A punctate renal calculus is noted in the right kidney. No hydronephrosis bilaterally. The bladder is unremarkable.   Stomach/Bowel: Gastric bypass surgical changes are noted. No bowel obstruction, free air, or pneumatosis is seen. Appendix is not seen. There is a patulous loop of small bowel in the anterior abdomen with a associated anastomotic sites. The loop of small bowel distal to this appears increasingly distended from the prior exam measuring 5.8 cm with a transition point at the distal anastomotic site, axial image 39.   Vascular/Lymphatic: No significant vascular findings are present. No enlarged abdominal or pelvic lymph nodes.   Reproductive: An IUD is noted in the uterus.  No adnexal mass.   Other: Small amount of free fluid is noted in the cul-de-sac.   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 No acute osseous abnormality is seen.   IMPRESSION: 1. Status post gastric surgery with small bowel resection and multiple anastomotic sites. There is a distended loop of small bowel with debris fluid levels and transition point at a distal anastomotic site, best seen on axial image 39, concerning for small bowel obstruction. 2. Right renal calculus.   Electronically Signed: By: Leita Birmingham M.D. On: 05/07/2024 18:23     Assessment:   SBO  Plan:   NPO, sips with meds for chronic issues. NG, IVF, SBFT.    Discussed how if surgical intervention is needed, will need transfer since surgery required will be beyond scope of practice here at [facility].  She was pending outpt f/u with [facility] before this latest visit  The patient verbalized understanding and all questions were answered to the patient's satisfaction.  labs/images/medications/previous chart entries reviewed personally and relevant changes/updates noted above.

## 2024-05-07 NOTE — ED Notes (Signed)
 NGT to suction until Gastrografin  instilled via NGT at 2215, NGT clamped and xray called, patient to floor and report given to RN, protocol sheet sent with patient along with NGT syringe and adapter

## 2024-05-07 NOTE — ED Notes (Deleted)
 NGT to suction until 2215,

## 2024-05-08 ENCOUNTER — Inpatient Hospital Stay

## 2024-05-08 DIAGNOSIS — K56609 Unspecified intestinal obstruction, unspecified as to partial versus complete obstruction: Secondary | ICD-10-CM | POA: Diagnosis not present

## 2024-05-08 LAB — BASIC METABOLIC PANEL WITH GFR
Anion gap: 6 (ref 5–15)
BUN: 9 mg/dL (ref 6–20)
CO2: 25 mmol/L (ref 22–32)
Calcium: 7.7 mg/dL — ABNORMAL LOW (ref 8.9–10.3)
Chloride: 111 mmol/L (ref 98–111)
Creatinine, Ser: 0.51 mg/dL (ref 0.44–1.00)
GFR, Estimated: >60 mL/min
Glucose, Bld: 79 mg/dL (ref 70–99)
Potassium: 3.5 mmol/L (ref 3.5–5.1)
Sodium: 141 mmol/L (ref 135–145)

## 2024-05-08 LAB — CBC
HCT: 28.9 % — ABNORMAL LOW (ref 36.0–46.0)
Hemoglobin: 9.3 g/dL — ABNORMAL LOW (ref 12.0–15.0)
MCH: 30.1 pg (ref 26.0–34.0)
MCHC: 32.2 g/dL (ref 30.0–36.0)
MCV: 93.5 fL (ref 80.0–100.0)
Platelets: 292 K/uL (ref 150–400)
RBC: 3.09 MIL/uL — ABNORMAL LOW (ref 3.87–5.11)
RDW: 13.9 % (ref 11.5–15.5)
WBC: 5.4 K/uL (ref 4.0–10.5)
nRBC: 0 % (ref 0.0–0.2)

## 2024-05-08 MED ORDER — SERTRALINE HCL 50 MG PO TABS
100.0000 mg | ORAL_TABLET | Freq: Two times a day (BID) | ORAL | Status: DC
  Administered 2024-05-08 – 2024-05-11 (×7): 100 mg
  Filled 2024-05-08 (×7): qty 2

## 2024-05-08 MED ORDER — SERTRALINE HCL 50 MG PO TABS: 100.0000 mg | ORAL_TABLET | Freq: Two times a day (BID) | ORAL | Status: DC

## 2024-05-08 MED ORDER — SODIUM CHLORIDE 0.9 % IV SOLN: INTRAVENOUS | Status: AC

## 2024-05-08 NOTE — Plan of Care (Signed)

## 2024-05-08 NOTE — Progress Notes (Signed)
 05/08/2024  Subjective: No acute events overnight.  Patient reports feeling less distended in her abdomen although still reports having some tenderness in the right lower quadrant.  She reports started having small amount of flatus this morning but denies any true consistent bowel function yet.  NG tube output not recorded.  KUB this morning done with still mild diffuse distention of the small and large bowel.  There is some contrast from the Gastrografin  in the right abdomen but I think this is within the small bowel.  Vital signs: Temp:  [97.9 F (36.6 C)-98.7 F (37.1 C)] 97.9 F (36.6 C) (01/04 0830) Pulse Rate:  [50-70] 50 (01/04 0830) Resp:  [16-20] 19 (01/04 0830) BP: (112-144)/(69-80) 117/74 (01/04 0830) SpO2:  [97 %-100 %] 100 % (01/04 0830) Weight:  [884.7 kg] 115.2 kg (01/03 1642)   Intake/Output: 01/03 0701 - 01/04 0700 In: 1000 [IV Piggyback:1000] Out: -     Physical Exam: Constitutional: No acute distress Abdomen: Soft, nondistended, with mild tenderness to palpation in the right lower quadrant.  No diffuse peritonitis.  NG tube in place currently clamped as she had just received medication.  Labs:  Recent Labs    05/07/24 1645 05/08/24 0542  WBC 7.2 5.4  HGB 10.1* 9.3*  HCT 31.2* 28.9*  PLT 333 292   Recent Labs    05/07/24 1645 05/08/24 0542  NA 140 141  K 3.5 3.5  CL 108 111  CO2 23 25  GLUCOSE 77 79  BUN 10 9  CREATININE 0.49 0.51  CALCIUM 7.7* 7.7*   No results for input(s): LABPROT, INR in the last 72 hours.  Imaging: DG Abd Portable 1V-Small Bowel Obstruction Protocol-initial, 8 hr delay Result Date: 05/08/2024 EXAM: 1 VIEW XRAY OF THE ABDOMEN 05/08/2024 06:32:00 AM COMPARISON: Abdominal series dated 05/07/2024. CLINICAL HISTORY: FINDINGS: LINES, TUBES AND DEVICES: Nasogastric tube remains in the mid stomach. BOWEL: There is mild diffuse gaseous distention of the small and large bowel. SOFT TISSUES: Surgical clips are noted in the gallbladder  fossa and left pelvis. BONES: No acute fracture. IMPRESSION: 1. Mild diffuse gaseous distention of the small and large bowel, similar to prior. 2. Nasogastric tube tip and side port in the mid stomach. Electronically signed by: Evalene Coho MD 05/08/2024 07:01 AM EST RP Workstation: [id number]   DG Abd Portable 1V-Small Bowel Protocol-Position Verification Result Date: 05/07/2024 EXAM: UPRIGHT AND SUPINE XRAY VIEWS OF THE ABDOMEN AND 1 VIEW(S) OF THE CHEST 05/07/2024 07:14:00 PM COMPARISON: CT abdomen and pelvis 05/07/2024 and abdominal radiographs 03/20/2024. CLINICAL HISTORY: [id number] Encounter for imaging study to confirm nasogastric (NG) tube placement [phone] Encounter for imaging study to confirm nasogastric (NG) tube placement. FINDINGS: Limited field of view for tube placement verification purposes. An enteric tube is present with the tip projecting over the left upper quadrant consistent with the location in the body of the stomach. Gas-filled small and large bowel without abnormal distention. No free intraabdominal air. Colonic interposition under the right hemidiaphragm. Residual contrast material in the renal collecting systems. Lung bases are clear. IMPRESSION: 1. Enteric tube tip projects over the left upper quadrant, consistent with the body of the stomach. Electronically signed by: Elsie Gravely MD 05/07/2024 07:36 PM EST RP Workstation: [id number]   CT ABDOMEN PELVIS W CONTRAST Addendum Date: 05/07/2024 ADDENDUM REPORT: 05/07/2024 18:30 ADDENDUM: Critical Value/emergent results were called by telephone at the time of interpretation on 05/07/2024 at 6:30 pm to provider [facility] , who verbally acknowledged these results. Electronically Signed   By:  Leita Birmingham M.D.   On: 05/07/2024 18:30   Result Date: 05/07/2024 CLINICAL DATA:  Evaluate small bowel obstruction. Worsening pain with nausea and vomiting. Gastric bypass 03/29/2024. EXAM: CT ABDOMEN AND PELVIS WITH CONTRAST TECHNIQUE:  Multidetector CT imaging of the abdomen and pelvis was performed using the standard protocol following bolus administration of intravenous contrast. RADIATION DOSE REDUCTION: This exam was performed according to the departmental dose-optimization program which includes automated exposure control, adjustment of the mA and/or kV according to patient size and/or use of iterative reconstruction technique. CONTRAST:  OMNIPAQUE  IOHEXOL  300 MG/ML  SOLN COMPARISON:  05/04/2024. FINDINGS: Lower chest: Atelectasis is present at the lung bases. Hepatobiliary: No focal liver abnormality is seen. Status post cholecystectomy. No biliary dilatation. Pancreas: Unremarkable. No pancreatic ductal dilatation or surrounding inflammatory changes. Spleen: Normal in size without focal abnormality. Adrenals/Urinary Tract: The adrenal glands are within normal limits. The kidneys enhance symmetrically. There is a cyst in the mid left kidney. A punctate renal calculus is noted in the right kidney. No hydronephrosis bilaterally. The bladder is unremarkable. Stomach/Bowel: Gastric bypass surgical changes are noted. No bowel obstruction, free air, or pneumatosis is seen. Appendix is not seen. There is a patulous loop of small bowel in the anterior abdomen with a associated anastomotic sites. The loop of small bowel distal to this appears increasingly distended from the prior exam measuring 5.8 cm with a transition point at the distal anastomotic site, axial image 39. Vascular/Lymphatic: No significant vascular findings are present. No enlarged abdominal or pelvic lymph nodes. Reproductive: An IUD is noted in the uterus.  No adnexal mass. Other: Small amount of free fluid is noted in the cul-de-sac. 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 No acute osseous abnormality is seen. IMPRESSION: 1. Status post gastric surgery with small bowel resection and multiple anastomotic sites. There is a distended  loop of small bowel with debris fluid levels and transition point at a distal anastomotic site, best seen on axial image 39, concerning for small bowel obstruction. 2. Right renal calculus. Electronically Signed: By: Leita Birmingham M.D. On: 05/07/2024 18:23    Assessment/Plan: This is a 39 y.o. female with small bowel obstruction.  - Discussed with patient the findings on her KUB from this morning.  She reports starting to have some flatus but denies any true consistent bowel function yet.  NG output not recorded.  Discussed with patient that for now we will keep her with NG tube to suction, n.p.o., with IV fluid hydration. - Will order repeat KUB for tomorrow morning.  If she has regained bowel function discussed with her that we could start potentially doing a clamping trial of her NG tube but all depends on how she is doing clinically.   I spent 35 minutes dedicated to the care of this patient on the date of this encounter to include pre-visit review of records, face-to-face time with the patient discussing diagnosis and management, and any post-visit coordination of care.  Aloysius Sheree Plant, MD [facility]

## 2024-05-09 ENCOUNTER — Inpatient Hospital Stay

## 2024-05-09 LAB — CBC
HCT: 29.3 % — ABNORMAL LOW (ref 36.0–46.0)
Hemoglobin: 9.9 g/dL — ABNORMAL LOW (ref 12.0–15.0)
MCH: 30.8 pg (ref 26.0–34.0)
MCHC: 33.8 g/dL (ref 30.0–36.0)
MCV: 91.3 fL (ref 80.0–100.0)
Platelets: 300 K/uL (ref 150–400)
RBC: 3.21 MIL/uL — ABNORMAL LOW (ref 3.87–5.11)
RDW: 13.8 % (ref 11.5–15.5)
WBC: 7.2 K/uL (ref 4.0–10.5)
nRBC: 0 % (ref 0.0–0.2)

## 2024-05-09 LAB — BASIC METABOLIC PANEL WITH GFR
Anion gap: 8 (ref 5–15)
BUN: 8 mg/dL (ref 6–20)
CO2: 24 mmol/L (ref 22–32)
Calcium: 7.9 mg/dL — ABNORMAL LOW (ref 8.9–10.3)
Chloride: 109 mmol/L (ref 98–111)
Creatinine, Ser: 0.49 mg/dL (ref 0.44–1.00)
GFR, Estimated: >60 mL/min
Glucose, Bld: 74 mg/dL (ref 70–99)
Potassium: 3.5 mmol/L (ref 3.5–5.1)
Sodium: 141 mmol/L (ref 135–145)

## 2024-05-09 MED ORDER — BUPROPION HCL ER (XL) 150 MG PO TB24
150.0000 mg | ORAL_TABLET | Freq: Every morning | ORAL | Status: DC
  Administered 2024-05-09 – 2024-05-11 (×3): 150 mg via ORAL
  Filled 2024-05-09 (×3): qty 1

## 2024-05-09 NOTE — Progress Notes (Signed)
 [facility]- General Surgery  SURGICAL PROGRESS NOTE  Hospital Day(s): 2.   Interval History:  Patient seen and examined. No acute events or new complaints overnight.  Patient reports feeling better. Feeling sore on right lower quadrant. Denies any nausea or vomiting. Admits passing a lot of gas this morning. No bowel movement reported. Waiting on abdominal x-ray report from this AM.    Vital signs in last 24 hours: [min-max] current  Temp:  [97.9 F (36.6 C)-98.4 F (36.9 C)] 97.9 F (36.6 C) (01/05 0539) Pulse Rate:  [47-56] 56 (01/05 0539) Resp:  [18-20] 20 (01/05 0539) BP: (117-140)/(74-86) 137/86 (01/05 0539) SpO2:  [94 %-100 %] 94 % (01/05 0539)     Height: 6' 1 (185.4 cm) Weight: 115.2 kg BMI (Calculated): 33.51   Intake/Output last 2 shifts:  01/04 0701 - 01/05 0700 In: 2441.3 [I.V.:2441.3] Out: 225 [Emesis/NG output:225]   Physical Exam:  Constitutional: alert, cooperative and no distress  Respiratory: breathing non-labored at rest  Cardiovascular: regular rate and sinus rhythm  Gastrointestinal: soft, mildly tender on RLQ, and non-distended  Labs:     Latest Ref Rng & Units 05/09/2024    4:55 AM 05/08/2024    5:42 AM 05/07/2024    4:45 PM  CBC  WBC 4.0 - 10.5 K/uL 7.2  5.4  7.2   Hemoglobin 12.0 - 15.0 g/dL 9.9  9.3  89.8   Hematocrit 36.0 - 46.0 % 29.3  28.9  31.2   Platelets 150 - 400 K/uL 300  292  333       Latest Ref Rng & Units 05/09/2024    4:55 AM 05/08/2024    5:42 AM 05/07/2024    4:45 PM  CMP  Glucose 70 - 99 mg/dL 74  79  77   BUN 6 - 20 mg/dL 8  9  10    Creatinine 0.44 - 1.00 mg/dL 9.50  9.48  9.50   Sodium 135 - 145 mmol/L 141  141  140   Potassium 3.5 - 5.1 mmol/L 3.5  3.5  3.5   Chloride 98 - 111 mmol/L 109  111  108   CO2 22 - 32 mmol/L 24  25  23    Calcium 8.9 - 10.3 mg/dL 7.9  7.7  7.7   Total Protein 6.5 - 8.1 g/dL   6.8   Total Bilirubin 0.0 - 1.2 mg/dL   0.4   Alkaline Phos 38 - 126 U/L   113   AST 15 - 41 U/L   22   ALT 0 - 44  U/L   24     Imaging studies: No new pertinent imaging studies   Assessment/Plan:  39 y.o. female with small bowel obstruction, complicated by pertinent comorbidities including chronic abdominal pain, diabetes mellitus, hyperlipidemia, obesity, GERD, history of gastric bypass.    - No fever not tachycardic, appears comfortable during encounter this morning.  Denies any worsening pain.  Although no bowel movement, patient has been experiencing flatulence.   - Reviewed abdominal x-ray images from this morning, appears to have contrast in colon. Final report still pending. Plan for NGT clamp trial this morning.  Will possibly removed NG tube and start liquid diet this afternoon.  - Continue pain management  - Encouraged to continue ambulate   -- Lindsie Simar Barrientos PA-C

## 2024-05-10 ENCOUNTER — Inpatient Hospital Stay

## 2024-05-10 LAB — CBC
HCT: 28.9 % — ABNORMAL LOW (ref 36.0–46.0)
Hemoglobin: 9.9 g/dL — ABNORMAL LOW (ref 12.0–15.0)
MCH: 30.9 pg (ref 26.0–34.0)
MCHC: 34.3 g/dL (ref 30.0–36.0)
MCV: 90.3 fL (ref 80.0–100.0)
Platelets: 283 K/uL (ref 150–400)
RBC: 3.2 MIL/uL — ABNORMAL LOW (ref 3.87–5.11)
RDW: 13.5 % (ref 11.5–15.5)
WBC: 4.7 K/uL (ref 4.0–10.5)
nRBC: 0 % (ref 0.0–0.2)

## 2024-05-10 LAB — BASIC METABOLIC PANEL WITH GFR
Anion gap: 9 (ref 5–15)
BUN: 6 mg/dL (ref 6–20)
CO2: 24 mmol/L (ref 22–32)
Calcium: 7.9 mg/dL — ABNORMAL LOW (ref 8.9–10.3)
Chloride: 109 mmol/L (ref 98–111)
Creatinine, Ser: 0.47 mg/dL (ref 0.44–1.00)
GFR, Estimated: >60 mL/min
Glucose, Bld: 79 mg/dL (ref 70–99)
Potassium: 3.5 mmol/L (ref 3.5–5.1)
Sodium: 142 mmol/L (ref 135–145)

## 2024-05-10 MED ORDER — IOHEXOL 9 MG/ML PO SOLN
500.0000 mL | ORAL | Status: AC
  Administered 2024-05-10 (×2): 500 mL via ORAL

## 2024-05-10 MED ORDER — IOHEXOL 300 MG/ML  SOLN
100.0000 mL | Freq: Once | INTRAMUSCULAR | Status: AC | PRN
  Administered 2024-05-10: 100 mL via INTRAVENOUS

## 2024-05-10 NOTE — Progress Notes (Signed)
 [facility]- General Surgery  SURGICAL PROGRESS NOTE  Hospital Day(s): 3.   Interval History:  Reports doing overall well.  Abdominal x-ray from yesterday showed no evidence of bowel obstruction. Continues to have bowel movements and is passing gas. Has tolerated clear liquid diet. Denies any nausea or vomiting. Still having RLQ pain worse with movement and pressure but overall controlled with pain medication.  Vital signs in last 24 hours: [min-max] current  Temp:  [97.7 F (36.5 C)-98.6 F (37 C)] 98.4 F (36.9 C) (01/06 0800) Pulse Rate:  [47-57] 50 (01/06 0800) Resp:  [16] 16 (01/06 0414) BP: (115-133)/(70-81) 118/71 (01/06 0800) SpO2:  [95 %-99 %] 99 % (01/06 0800)     Height: 6' 1 (185.4 cm) Weight: 115.2 kg BMI (Calculated): 33.51   Intake/Output last 2 shifts:  01/05 0701 - 01/06 0700 In: 2400 [P.O.:2160] Out: -    Physical Exam:  Constitutional: alert, cooperative and no distress  Respiratory: breathing non-labored at rest  Cardiovascular: regular rate and sinus rhythm  Gastrointestinal: soft, tender suprapubic and RLQ, and non-distended  Labs:     Latest Ref Rng & Units 05/10/2024    5:03 AM 05/09/2024    4:55 AM 05/08/2024    5:42 AM  CBC  WBC 4.0 - 10.5 K/uL 4.7  7.2  5.4   Hemoglobin 12.0 - 15.0 g/dL 9.9  9.9  9.3   Hematocrit 36.0 - 46.0 % 28.9  29.3  28.9   Platelets 150 - 400 K/uL 283  300  292       Latest Ref Rng & Units 05/10/2024    5:03 AM 05/09/2024    4:55 AM 05/08/2024    5:42 AM  CMP  Glucose 70 - 99 mg/dL 79  74  79   BUN 6 - 20 mg/dL 6  8  9    Creatinine 0.44 - 1.00 mg/dL 9.52  9.50  9.48   Sodium 135 - 145 mmol/L 142  141  141   Potassium 3.5 - 5.1 mmol/L 3.5  3.5  3.5   Chloride 98 - 111 mmol/L 109  109  111   CO2 22 - 32 mmol/L 24  24  25    Calcium 8.9 - 10.3 mg/dL 7.9  7.9  7.7     Imaging studies: No new pertinent imaging studies   Assessment/Plan:  39 y.o. female with  small bowel obstruction, complicated by pertinent  comorbidities including chronic abdominal pain, diabetes mellitus, hyperlipidemia, obesity, GERD, history of gastric bypass.    - Stable vital signs, no fever not tachycardic.  Still having right lower quadrant pain, but denies any worsening pain.  Having good return of GI function.  - Advance to full liquid diet.  Will likely advance to regular diet for lunch.  If patient starts experiencing worsening abdominal pain, will consider reimaging.  - Encouraged to continue to ambulate  - Continue pain management  -- Lili Harts Barrientos PA-C

## 2024-05-10 NOTE — Plan of Care (Signed)

## 2024-05-11 LAB — BASIC METABOLIC PANEL WITH GFR
Anion gap: 8 (ref 5–15)
BUN: 6 mg/dL (ref 6–20)
CO2: 26 mmol/L (ref 22–32)
Calcium: 8.5 mg/dL — ABNORMAL LOW (ref 8.9–10.3)
Chloride: 108 mmol/L (ref 98–111)
Creatinine, Ser: 0.48 mg/dL (ref 0.44–1.00)
GFR, Estimated: >60 mL/min
Glucose, Bld: 82 mg/dL (ref 70–99)
Potassium: 3.6 mmol/L (ref 3.5–5.1)
Sodium: 141 mmol/L (ref 135–145)

## 2024-05-11 LAB — CBC
HCT: 32.7 % — ABNORMAL LOW (ref 36.0–46.0)
Hemoglobin: 10.5 g/dL — ABNORMAL LOW (ref 12.0–15.0)
MCH: 29.8 pg (ref 26.0–34.0)
MCHC: 32.1 g/dL (ref 30.0–36.0)
MCV: 92.9 fL (ref 80.0–100.0)
Platelets: 333 K/uL (ref 150–400)
RBC: 3.52 MIL/uL — ABNORMAL LOW (ref 3.87–5.11)
RDW: 14 % (ref 11.5–15.5)
WBC: 4.3 K/uL (ref 4.0–10.5)
nRBC: 0 % (ref 0.0–0.2)

## 2024-05-11 NOTE — TOC CM/SW Note (Signed)
 Transition of Care [facility]) - Inpatient Brief Assessment   Patient Details  Name: Maria Fuller MRN: [id number] Date of Birth: July 26, 1985  [facility]) CM/SW Contact:    Corean ONEIDA Haddock, RN Phone Number: 05/11/2024, 9:06 AM   Clinical Narrative:  Transition of Care Department [facility]) has reviewed patient and no TOC needs have been identified at this time.  If new patient transition needs arise, please place a TOC consult.  Patient states that she lives at home with her parents, husband, and children.  She states that her father or husband will transport at discharge.   She states she uses [facility] pharmacy and denies issues obtaining her medications.  No TOC needs identified    Transition of Care Asessment: Insurance and Status: Insurance coverage has been reviewed Patient has primary care physician: Yes     Prior/Current Home Services: No current home services Social Drivers of Health Review: SDOH reviewed no interventions necessary Readmission risk has been reviewed: Yes Transition of care needs: no transition of care needs at this time

## 2024-05-11 NOTE — Discharge Instructions (Addendum)
-  Recommend to keep your follow-up with [facility] in February 2026

## 2024-05-11 NOTE — Plan of Care (Signed)

## 2024-05-11 NOTE — Plan of Care (Signed)

## 2024-05-11 NOTE — Discharge Summary (Signed)
 [facility]-General Surgery  SURGICAL DISCHARGE SUMMARY  Patient ID: Maria Fuller MRN: [id number] DOB/AGE: Nov 15, 1985 39 y.o.  Admit date: 05/07/2024 Discharge date: 05/11/2024  Discharge Diagnoses Patient Active Problem List   Diagnosis Date Noted   Small bowel obstruction (HCC) 03/19/2024   Depression with anxiety 03/19/2024   Obesity (BMI 30-39.9) 03/19/2024   Normocytic anemia 03/19/2024   Lower extremity edema 02/20/2024   Elevated brain natriuretic peptide (BNP) level 02/20/2024   SBO (small bowel obstruction) (HCC) 08/02/2023   Epigastric pain 11/24/2022   Food bolus obstruction of intestine (HCC) 11/23/2022   Depression 11/23/2022   Bradycardia 04/19/2022   Left ureteral stone 04/18/2022   Febrile urinary tract infection 04/18/2022   Major depressive disorder, recurrent, moderate (HCC) 11/16/2020   Internal hernia 08/04/2019   Right ovarian cyst 09/09/2018   Secondary oligomenorrhea 05/25/2018   Rectal pain 05/25/2018   PCOS (polycystic ovarian syndrome) 05/25/2018   Menometrorrhagia 05/25/2018   Abdominal pain, chronic, right lower quadrant 02/09/2018   Hydronephrosis 09/22/2016   Left renal stone 09/22/2016   Left flank pain 12/11/2015   Right flank pain 10/06/2015   Status post bariatric surgery 09/13/2015   History of hypertension 08/30/2015   History of PCOS 08/30/2015   Vitamin D deficiency 04/20/2015   Diabetes mellitus (HCC) 03/27/2015   Dysmenorrhea 05/25/2014   Rectal bleeding 05/25/2014   Contraception 04/07/2014   Dysfunction of both eustachian tubes 04/07/2014   Migraines 04/07/2014   Hypothyroidism 07/22/2013   Major depressive disorder, single episode 07/22/2013   HTN (hypertension) 07/15/2013   Headache 07/10/2013   Anxiety state 03/05/2013   Morbid obesity with BMI of 50.0-59.9, adult (HCC) 03/05/2013   Preventative health care 03/05/2013   Morbid obesity (HCC) 03/05/2013   History of diabetes mellitus, type II 03/04/2013   Diabetes  mellitus type 2, uncontrolled 03/04/2013   Obstructive sleep apnea 09/14/2012   Hyperlipidemia 07/28/2012    Consultants None   Procedures None    Hospital Course:  Patient presented to [facility] ED on 05/07/2024 with abdominal pain.  In the ED, patient was hypertensive with BP 144/80, afebrile, with a HR of 56 bpm.  No leukocytosis indicated on labs.  CMP indicated hypocalcemia levels, otherwise rest of lab results reassuring.  Lipase normal at 22.  CT of abdomen showed distended loop of small bowel with transition point at the distal anastomotic site concerning for small bowel obstruction.  Patient is status post gastric surgery with small bowel resection and multiple anastomotic sites. Patient was admitted for observation and management of small bowel obstruction.  Patient had NG tube placed and was NPO until return of GI function.  Small bowel protocol was performed the following day with no significant change in abdominal x-ray. 05/09/2024-Patient had multiple bowel movements and reported flatulence.  NG tube was clamped with little to no residual.  She was started on clear liquid diet and was slowly advance to regular diet.   05/10/2024-Although patient was tolerating regular diet and was still experiencing flatulence, she complained of persistent right lower quadrant pain.  CT of abdomen pelvis was ordered with oral and IV contrast which showed mildly dilated small bowel loops favoring ileus versus enteritis, with oral contrast seen throughout colon. No other acute findings. 05/11/2024- Patient was still having bowel movements and passing gas.  She was still tolerating regular diet, denying any nausea or vomiting.  She is comfortable with going home. Abdominal discomfort was overall better controlled with pain medication. Patient was admitted in November 2025  for medical management of SBO. Encouraged to follow-up with [facility] bariatric surgery and discussed the reoccurrence of SBO. Patient has an  appointment scheduled in February 2026.    Physical Examination:  Constitutional: alert, in no acute distress Pulmonary: CTA bilaterally, normal breath sounds Cardiac: regular rate and rhythm Gastrointestinal: soft, mildly tender on RLQ, and non-distended   Allergies as of 05/11/2024       Reactions   Cefuroxime Dermatitis   Ceftin [cefuroxime Axetil] Rash   Tolerated cefazolin  before   Penicillins Rash, Other (See Comments)   TOLERATED CEFAZOLIN  BEFORE   Prochlorperazine  Other (See Comments), Anxiety   Panic attack        Medication List     TAKE these medications    buPROPion  150 MG 24 hr tablet Commonly known as: WELLBUTRIN  XL Take 150 mg by mouth every morning.   clonazePAM  0.5 MG tablet Commonly known as: KLONOPIN  Take 0.5 mg by mouth daily.   dicyclomine  10 MG capsule Commonly known as: BENTYL  Take 1 capsule (10 mg total) by mouth 4 (four) times daily -  before meals and at bedtime.   furosemide  20 MG tablet Commonly known as: LASIX  Take 1 tablet (20 mg total) by mouth daily as needed.   gabapentin  600 MG tablet Commonly known as: NEURONTIN  Take 600 mg by mouth 3 (three) times daily.   hydrOXYzine  25 MG tablet Commonly known as: ATARAX  Take 1 tablet by mouth 3 (three) times daily as needed.   levonorgestrel  20 MCG/24HR IUD Commonly known as: MIRENA  1 each by Intrauterine route once.   methocarbamol  500 MG tablet Commonly known as: ROBAXIN  Take 1,000 mg by mouth 3 (three) times daily.   nystatin powder Commonly known as: MYCOSTATIN/NYSTOP Apply 1 Application topically as needed.   ondansetron  4 MG disintegrating tablet Commonly known as: ZOFRAN -ODT Take 1 tablet (4 mg total) by mouth every 8 (eight) hours as needed.   oxyCODONE  5 MG immediate release tablet Commonly known as: Oxy IR/ROXICODONE  Take 1 tablet (5 mg total) by mouth every 6 (six) hours as needed.   oxyCODONE -acetaminophen  5-325 MG tablet Commonly known as:  PERCOCET/ROXICET Take 1 tablet by mouth every 8 (eight) hours as needed for severe pain (pain score 7-10).   pantoprazole  40 MG tablet Commonly known as: PROTONIX  Take 1 tablet (40 mg total) by mouth 2 (two) times daily before a meal.   polyethylene glycol powder 17 GM/SCOOP powder Commonly known as: GLYCOLAX /MIRALAX  Take 17 g by mouth daily.   potassium chloride  SA 20 MEQ tablet Commonly known as: KLOR-CON  M Take 1 tablet (20 mEq total) by mouth daily as needed.   predniSONE  10 MG tablet Commonly known as: DELTASONE  Take 6 tablets  today, on day 2 take 5 tablets, day 3 take 4 tablets, day 4 take 3 tablets, day 5 take  2 tablets and 1 tablet the last day   promethazine  25 MG suppository Commonly known as: PHENERGAN  Place 1 suppository (25 mg total) rectally every 6 (six) hours as needed for nausea or vomiting.   scopolamine  1 MG/3DAYS Commonly known as: TRANSDERM-SCOP 1 patch every three (3) days as needed.   sertraline  100 MG tablet Commonly known as: ZOLOFT  Take 100 mg by mouth 2 (two) times daily.   traZODone  50 MG tablet Commonly known as: DESYREL  Take 50 mg by mouth at bedtime.            Time spent on discharge management including discussion of hospital course, clinical condition, outpatient instructions, prescriptions, and follow up  with the patient and members of the medical team: >30 minutes  Corianna Avallone Barrientos PA-C

## 2024-06-03 ENCOUNTER — Emergency Department

## 2024-06-03 ENCOUNTER — Other Ambulatory Visit: Payer: Self-pay

## 2024-06-03 ENCOUNTER — Inpatient Hospital Stay: Admission: EM | Admit: 2024-06-03 | Discharge: 2024-06-05 | DRG: 390 | Disposition: A

## 2024-06-03 ENCOUNTER — Encounter: Payer: Self-pay | Admitting: Emergency Medicine

## 2024-06-03 DIAGNOSIS — Z6832 Body mass index (BMI) 32.0-32.9, adult: Secondary | ICD-10-CM | POA: Diagnosis not present

## 2024-06-03 DIAGNOSIS — Z8661 Personal history of infections of the central nervous system: Secondary | ICD-10-CM | POA: Diagnosis not present

## 2024-06-03 DIAGNOSIS — G4733 Obstructive sleep apnea (adult) (pediatric): Secondary | ICD-10-CM | POA: Diagnosis present

## 2024-06-03 DIAGNOSIS — E119 Type 2 diabetes mellitus without complications: Secondary | ICD-10-CM | POA: Diagnosis present

## 2024-06-03 DIAGNOSIS — Z833 Family history of diabetes mellitus: Secondary | ICD-10-CM | POA: Diagnosis not present

## 2024-06-03 DIAGNOSIS — E785 Hyperlipidemia, unspecified: Secondary | ICD-10-CM | POA: Diagnosis not present

## 2024-06-03 DIAGNOSIS — F32A Depression, unspecified: Secondary | ICD-10-CM | POA: Diagnosis present

## 2024-06-03 DIAGNOSIS — F419 Anxiety disorder, unspecified: Secondary | ICD-10-CM | POA: Diagnosis present

## 2024-06-03 DIAGNOSIS — Z888 Allergy status to other drugs, medicaments and biological substances status: Secondary | ICD-10-CM | POA: Diagnosis not present

## 2024-06-03 DIAGNOSIS — Z79899 Other long term (current) drug therapy: Secondary | ICD-10-CM | POA: Diagnosis not present

## 2024-06-03 DIAGNOSIS — E669 Obesity, unspecified: Secondary | ICD-10-CM | POA: Diagnosis present

## 2024-06-03 DIAGNOSIS — Z9049 Acquired absence of other specified parts of digestive tract: Secondary | ICD-10-CM | POA: Diagnosis not present

## 2024-06-03 DIAGNOSIS — I10 Essential (primary) hypertension: Secondary | ICD-10-CM | POA: Diagnosis present

## 2024-06-03 DIAGNOSIS — Z87442 Personal history of urinary calculi: Secondary | ICD-10-CM | POA: Diagnosis not present

## 2024-06-03 DIAGNOSIS — K561 Intussusception: Secondary | ICD-10-CM | POA: Diagnosis present

## 2024-06-03 DIAGNOSIS — F418 Other specified anxiety disorders: Secondary | ICD-10-CM | POA: Diagnosis not present

## 2024-06-03 DIAGNOSIS — Z975 Presence of (intrauterine) contraceptive device: Secondary | ICD-10-CM

## 2024-06-03 DIAGNOSIS — Z9884 Bariatric surgery status: Secondary | ICD-10-CM

## 2024-06-03 DIAGNOSIS — Z880 Allergy status to penicillin: Secondary | ICD-10-CM

## 2024-06-03 DIAGNOSIS — Z8719 Personal history of other diseases of the digestive system: Secondary | ICD-10-CM | POA: Diagnosis not present

## 2024-06-03 DIAGNOSIS — R109 Unspecified abdominal pain: Secondary | ICD-10-CM | POA: Diagnosis present

## 2024-06-03 DIAGNOSIS — Z881 Allergy status to other antibiotic agents status: Secondary | ICD-10-CM | POA: Diagnosis not present

## 2024-06-03 DIAGNOSIS — E7800 Pure hypercholesterolemia, unspecified: Secondary | ICD-10-CM | POA: Diagnosis present

## 2024-06-03 LAB — COMPREHENSIVE METABOLIC PANEL WITH GFR
ALT: 16 U/L (ref 0–44)
AST: 18 U/L (ref 15–41)
Albumin: 4.4 g/dL (ref 3.5–5.0)
Alkaline Phosphatase: 124 U/L (ref 38–126)
Anion gap: 7 (ref 5–15)
BUN: 13 mg/dL (ref 6–20)
CO2: 21 mmol/L — ABNORMAL LOW (ref 22–32)
Calcium: 8.1 mg/dL — ABNORMAL LOW (ref 8.9–10.3)
Chloride: 110 mmol/L (ref 98–111)
Creatinine, Ser: 0.55 mg/dL (ref 0.44–1.00)
GFR, Estimated: >60 mL/min
Glucose, Bld: 91 mg/dL (ref 70–99)
Potassium: 3.9 mmol/L (ref 3.5–5.1)
Sodium: 138 mmol/L (ref 135–145)
Total Bilirubin: 0.3 mg/dL (ref 0.0–1.2)
Total Protein: 6.9 g/dL (ref 6.5–8.1)

## 2024-06-03 LAB — CBC WITH DIFFERENTIAL/PLATELET
Abs Immature Granulocytes: 0.02 10*3/uL (ref 0.00–0.07)
Basophils Absolute: 0 10*3/uL (ref 0.0–0.1)
Basophils Relative: 1 %
Eosinophils Absolute: 0.2 10*3/uL (ref 0.0–0.5)
Eosinophils Relative: 3 %
HCT: 31.6 % — ABNORMAL LOW (ref 36.0–46.0)
Hemoglobin: 10.2 g/dL — ABNORMAL LOW (ref 12.0–15.0)
Immature Granulocytes: 0 %
Lymphocytes Relative: 28 %
Lymphs Abs: 1.8 10*3/uL (ref 0.7–4.0)
MCH: 30.1 pg (ref 26.0–34.0)
MCHC: 32.3 g/dL (ref 30.0–36.0)
MCV: 93.2 fL (ref 80.0–100.0)
Monocytes Absolute: 0.4 10*3/uL (ref 0.1–1.0)
Monocytes Relative: 7 %
Neutro Abs: 3.9 10*3/uL (ref 1.7–7.7)
Neutrophils Relative %: 61 %
Platelets: 301 10*3/uL (ref 150–400)
RBC: 3.39 MIL/uL — ABNORMAL LOW (ref 3.87–5.11)
RDW: 14.5 % (ref 11.5–15.5)
WBC: 6.3 10*3/uL (ref 4.0–10.5)
nRBC: 0 % (ref 0.0–0.2)

## 2024-06-03 LAB — URINALYSIS, ROUTINE W REFLEX MICROSCOPIC
Bilirubin Urine: NEGATIVE
Glucose, UA: NEGATIVE mg/dL
Hgb urine dipstick: NEGATIVE
Ketones, ur: NEGATIVE mg/dL
Leukocytes,Ua: NEGATIVE
Nitrite: NEGATIVE
Protein, ur: NEGATIVE mg/dL
Specific Gravity, Urine: 1.021 (ref 1.005–1.030)
pH: 5 (ref 5.0–8.0)

## 2024-06-03 LAB — LIPASE, BLOOD: Lipase: 19 U/L (ref 11–51)

## 2024-06-03 MED ORDER — ENOXAPARIN SODIUM 60 MG/0.6ML IJ SOSY
0.5000 mg/kg | PREFILLED_SYRINGE | INTRAMUSCULAR | Status: DC
  Administered 2024-06-03: 55 mg via SUBCUTANEOUS
  Filled 2024-06-03: qty 0.6

## 2024-06-03 MED ORDER — IOHEXOL 300 MG/ML  SOLN
100.0000 mL | Freq: Once | INTRAMUSCULAR | Status: AC | PRN
  Administered 2024-06-03: 100 mL via INTRAVENOUS

## 2024-06-03 MED ORDER — ACETAMINOPHEN 325 MG PO TABS
650.0000 mg | ORAL_TABLET | Freq: Four times a day (QID) | ORAL | Status: DC | PRN
  Administered 2024-06-04: 650 mg via ORAL
  Filled 2024-06-03: qty 2

## 2024-06-03 MED ORDER — MORPHINE SULFATE (PF) 2 MG/ML IV SOLN
2.0000 mg | INTRAVENOUS | Status: DC | PRN
  Administered 2024-06-04 – 2024-06-05 (×7): 2 mg via INTRAVENOUS
  Filled 2024-06-03 (×7): qty 1

## 2024-06-03 MED ORDER — SODIUM CHLORIDE 0.9 % IV SOLN: Freq: Once | INTRAVENOUS | Status: AC

## 2024-06-03 MED ORDER — ONDANSETRON HCL 4 MG PO TABS: 4.0000 mg | ORAL_TABLET | Freq: Four times a day (QID) | ORAL | Status: DC | PRN

## 2024-06-03 MED ORDER — SODIUM CHLORIDE 0.9 % IV SOLN: INTRAVENOUS | Status: AC

## 2024-06-03 MED ORDER — PANTOPRAZOLE SODIUM 40 MG PO TBEC
40.0000 mg | DELAYED_RELEASE_TABLET | Freq: Two times a day (BID) | ORAL | Status: DC
  Administered 2024-06-04 – 2024-06-05 (×3): 40 mg via ORAL
  Filled 2024-06-03 (×3): qty 1

## 2024-06-03 MED ORDER — CLONAZEPAM 0.5 MG PO TABS
0.5000 mg | ORAL_TABLET | Freq: Every day | ORAL | Status: DC
  Administered 2024-06-04 – 2024-06-05 (×2): 0.5 mg via ORAL
  Filled 2024-06-03 (×2): qty 1

## 2024-06-03 MED ORDER — MORPHINE SULFATE (PF) 4 MG/ML IV SOLN
4.0000 mg | Freq: Once | INTRAVENOUS | Status: AC
  Administered 2024-06-03: 4 mg via INTRAVENOUS
  Filled 2024-06-03: qty 1

## 2024-06-03 MED ORDER — ACETAMINOPHEN 650 MG RE SUPP: 650.0000 mg | Freq: Four times a day (QID) | RECTAL | Status: DC | PRN

## 2024-06-03 MED ORDER — METHOCARBAMOL 500 MG PO TABS
1000.0000 mg | ORAL_TABLET | Freq: Three times a day (TID) | ORAL | Status: DC
  Administered 2024-06-03 – 2024-06-05 (×5): 1000 mg via ORAL
  Filled 2024-06-03 (×5): qty 2

## 2024-06-03 MED ORDER — ONDANSETRON HCL 4 MG/2ML IJ SOLN
4.0000 mg | Freq: Once | INTRAMUSCULAR | Status: AC
  Administered 2024-06-03: 4 mg via INTRAVENOUS
  Filled 2024-06-03: qty 2

## 2024-06-03 MED ORDER — ONDANSETRON HCL 4 MG/2ML IJ SOLN
4.0000 mg | Freq: Four times a day (QID) | INTRAMUSCULAR | Status: DC | PRN
  Administered 2024-06-04 (×2): 4 mg via INTRAVENOUS
  Filled 2024-06-03 (×2): qty 2

## 2024-06-03 MED ORDER — TRAZODONE HCL 50 MG PO TABS
50.0000 mg | ORAL_TABLET | Freq: Every day | ORAL | Status: DC
  Administered 2024-06-03 – 2024-06-04 (×2): 50 mg via ORAL
  Filled 2024-06-03 (×2): qty 1

## 2024-06-03 NOTE — ED Provider Triage Note (Signed)
 Emergency Medicine Provider Triage Evaluation Note  Maria Fuller , a 39 y.o. female  was evaluated in triage.  Pt complains of abdominal pain.  History of multiple bowel obstructions.  States symptoms are the same.  Sent here by her doctor to primary care.  Review of Systems  Positive:  Negative:   Physical Exam  There were no vitals taken for this visit. Gen:   Awake, no distress   Resp:  Normal effort  MSK:   Moves extremities without difficulty  Other:    Medical Decision Making  Medically screening exam initiated at 1:55 PM.  Appropriate orders placed.  Kjersti Dittmer Ferrufino was informed that the remainder of the evaluation will be completed by another provider, this initial triage assessment does not replace that evaluation, and the importance of remaining in the ED until their evaluation is complete.  Maji ordered.  Patient's had multiple CTs.  Will do plain x-ray in hopes of seeing bowel obstruction without having to do CT   Gasper Devere ORN, PA-C 06/03/24 1356

## 2024-06-03 NOTE — Progress Notes (Signed)
 Anticoagulation monitoring(Lovenox ):  39 yo  female ordered Lovenox  40 mg Q24h    Filed Weights   06/03/24 1406  Weight: 112 kg (247 lb)   BMI 32.6    Lab Results  Component Value Date   CREATININE 0.55 06/03/2024   CREATININE 0.48 05/11/2024   CREATININE 0.47 05/10/2024   Estimated Creatinine Clearance: 134.1 mL/min (by C-G formula based on SCr of 0.55 mg/dL). Hemoglobin & Hematocrit     Component Value Date/Time   HGB 10.2 (L) 06/03/2024 1358   HGB 15.1 07/29/2011 0332   HCT 31.6 (L) 06/03/2024 1358   HCT 44.1 07/29/2011 0332     Per Protocol for Patient with estCrcl > 30 ml/min and BMI > 30, will transition to Lovenox  55 mg Q24h.

## 2024-06-03 NOTE — ED Provider Notes (Signed)
 "  [facility] Provider Note    Event Date/Time   First MD Initiated Contact with Patient 06/03/24 1551     (approximate)   History   Abdominal Pain and Emesis   HPI  Maria Fuller is a 39 y.o. female with a history of gastric bypass, small bowel obstructions in the past, diabetes who presents with complaints of abdominal pain, nausea vomiting.  She is concerned that she is developing a small bowel obstruction again.  Symptoms started yesterday.  Patient was discharged on May 11, 2024 after treatment for small bowel obstruction     Physical Exam   Triage Vital Signs: ED Triage Vitals  Encounter Vitals Group     BP 06/03/24 1408 (!) 157/97     Girls Systolic BP Percentile --      Girls Diastolic BP Percentile --      Boys Systolic BP Percentile --      Boys Diastolic BP Percentile --      Pulse Rate 06/03/24 1408 71     Resp 06/03/24 1408 18     Temp 06/03/24 1408 98.3 F (36.8 C)     Temp src --      SpO2 06/03/24 1408 99 %     Weight 06/03/24 1406 112 kg (247 lb)     Height 06/03/24 1406 1.854 m (6' 1)     Head Circumference --      Peak Flow --      Pain Score 06/03/24 1406 8     Pain Loc --      Pain Education --      Exclude from Growth Chart --     Most recent vital signs: Vitals:   06/03/24 1408 06/03/24 1827  BP: (!) 157/97 (!) 150/90  Pulse: 71 68  Resp: 18 18  Temp: 98.3 F (36.8 C) 98 F (36.7 C)  SpO2: 99% 99%     General: Awake, no distress.  CV:  Good peripheral perfusion.  Resp:  Normal effort.  Abd:  Mild periumbilical tenderness to palpation, no significant distention Other:     ED Results / Procedures / Treatments   Labs (all labs ordered are listed, but only abnormal results are displayed) Labs Reviewed  COMPREHENSIVE METABOLIC PANEL WITH GFR - Abnormal; Notable for the following components:      Result Value   CO2 21 (*)    Calcium 8.1 (*)    All other components within normal limits  CBC WITH  DIFFERENTIAL/PLATELET - Abnormal; Notable for the following components:   RBC 3.39 (*)    Hemoglobin 10.2 (*)    HCT 31.6 (*)    All other components within normal limits  URINALYSIS, ROUTINE W REFLEX MICROSCOPIC - Abnormal; Notable for the following components:   Color, Urine YELLOW (*)    APPearance HAZY (*)    All other components within normal limits  LIPASE, BLOOD  CBC  CREATININE, SERUM  POC URINE PREG, ED     EKG     RADIOLOGY KUB viewed by me, no clear small bowel distention    PROCEDURES:  Critical Care performed:   Procedures   MEDICATIONS ORDERED IN ED: Medications  enoxaparin  (LOVENOX ) injection 55 mg (has no administration in time range)  0.9 %  sodium chloride  infusion (has no administration in time range)  acetaminophen  (TYLENOL ) tablet 650 mg (has no administration in time range)    Or  acetaminophen  (TYLENOL ) suppository 650 mg (has no administration in time  range)  ondansetron  (ZOFRAN ) tablet 4 mg (has no administration in time range)    Or  ondansetron  (ZOFRAN ) injection 4 mg (has no administration in time range)  morphine  (PF) 4 MG/ML injection 4 mg (4 mg Intravenous Given 06/03/24 1703)  ondansetron  (ZOFRAN ) injection 4 mg (4 mg Intravenous Given 06/03/24 1703)  0.9 %  sodium chloride  infusion (0 mLs Intravenous Stopped 06/03/24 2114)  iohexol  (OMNIPAQUE ) 300 MG/ML solution 100 mL (100 mLs Intravenous Contrast Given 06/03/24 1913)  0.9 %  sodium chloride  infusion (0 mLs Intravenous Stopped 06/03/24 2239)  morphine  (PF) 4 MG/ML injection 4 mg (4 mg Intravenous Given 06/03/24 2144)     IMPRESSION / MDM / ASSESSMENT AND PLAN / ED COURSE  I reviewed the triage vital signs and the nursing notes. Patient's presentation is most consistent with acute presentation with potential threat to life or bodily function.  Patient presents with abdominal pain, nausea vomiting as detailed above, differential includes small bowel obstruction, enteritis, ileus  Via  shared decision making she opted for CT abdomen pelvis, will treat with IV morphine , IV Zofran , IV fluids  CT scan demonstrates intussusception, I consulted Dr. Jordis of general surgery who recommends admission to medicine for monitoring and he will see the patient in the morning  Discussed with hospitalist for admission      FINAL CLINICAL IMPRESSION(S) / ED DIAGNOSES   Final diagnoses:  Intussusception intestine (HCC)     Rx / DC Orders   ED Discharge Orders     None        Note:  This document was prepared using Dragon voice recognition software and may include unintentional dictation errors.   Arlander Charleston, MD 06/03/24 2241  "

## 2024-06-04 ENCOUNTER — Inpatient Hospital Stay

## 2024-06-04 DIAGNOSIS — F418 Other specified anxiety disorders: Secondary | ICD-10-CM

## 2024-06-04 DIAGNOSIS — Z9884 Bariatric surgery status: Secondary | ICD-10-CM

## 2024-06-04 DIAGNOSIS — G4733 Obstructive sleep apnea (adult) (pediatric): Secondary | ICD-10-CM

## 2024-06-04 DIAGNOSIS — E119 Type 2 diabetes mellitus without complications: Secondary | ICD-10-CM

## 2024-06-04 DIAGNOSIS — K561 Intussusception: Secondary | ICD-10-CM | POA: Diagnosis not present

## 2024-06-04 DIAGNOSIS — E785 Hyperlipidemia, unspecified: Secondary | ICD-10-CM

## 2024-06-04 DIAGNOSIS — I10 Essential (primary) hypertension: Secondary | ICD-10-CM

## 2024-06-04 LAB — MAGNESIUM: Magnesium: 1.8 mg/dL (ref 1.7–2.4)

## 2024-06-04 LAB — CREATININE, SERUM
Creatinine, Ser: 0.51 mg/dL (ref 0.44–1.00)
GFR, Estimated: >60 mL/min

## 2024-06-04 LAB — CBC
HCT: 28.2 % — ABNORMAL LOW (ref 36.0–46.0)
Hemoglobin: 9.4 g/dL — ABNORMAL LOW (ref 12.0–15.0)
MCH: 30.4 pg (ref 26.0–34.0)
MCHC: 33.3 g/dL (ref 30.0–36.0)
MCV: 91.3 fL (ref 80.0–100.0)
Platelets: 269 10*3/uL (ref 150–400)
RBC: 3.09 MIL/uL — ABNORMAL LOW (ref 3.87–5.11)
RDW: 14.4 % (ref 11.5–15.5)
WBC: 7 10*3/uL (ref 4.0–10.5)
nRBC: 0 % (ref 0.0–0.2)

## 2024-06-04 LAB — HEMOGLOBIN A1C
Hgb A1c MFr Bld: 5 % (ref 4.8–5.6)
Mean Plasma Glucose: 96.8 mg/dL

## 2024-06-04 MED ORDER — SERTRALINE HCL 50 MG PO TABS
100.0000 mg | ORAL_TABLET | Freq: Two times a day (BID) | ORAL | Status: DC
  Administered 2024-06-04 – 2024-06-05 (×3): 100 mg via ORAL
  Filled 2024-06-04 (×3): qty 2

## 2024-06-04 MED ORDER — GABAPENTIN 300 MG PO CAPS
600.0000 mg | ORAL_CAPSULE | Freq: Three times a day (TID) | ORAL | Status: DC
  Administered 2024-06-05: 600 mg via ORAL
  Filled 2024-06-04 (×2): qty 2

## 2024-06-04 MED ORDER — BUPROPION HCL ER (XL) 150 MG PO TB24
150.0000 mg | ORAL_TABLET | ORAL | Status: DC
  Administered 2024-06-04 – 2024-06-05 (×2): 150 mg via ORAL
  Filled 2024-06-04 (×2): qty 1

## 2024-06-04 MED ORDER — HYDRALAZINE HCL 20 MG/ML IJ SOLN: 10.0000 mg | Freq: Three times a day (TID) | INTRAMUSCULAR | Status: DC

## 2024-06-04 MED ORDER — HYDROXYZINE HCL 25 MG PO TABS
25.0000 mg | ORAL_TABLET | Freq: Three times a day (TID) | ORAL | Status: DC | PRN
  Administered 2024-06-05: 25 mg via ORAL
  Filled 2024-06-04: qty 1

## 2024-06-04 MED ORDER — IOHEXOL 300 MG/ML  SOLN
100.0000 mL | Freq: Once | INTRAMUSCULAR | Status: AC | PRN
  Administered 2024-06-04: 100 mL via INTRAVENOUS

## 2024-06-04 MED ORDER — IOHEXOL 9 MG/ML PO SOLN
500.0000 mL | ORAL | Status: AC
  Administered 2024-06-04 (×2): 500 mL via ORAL

## 2024-06-04 MED ORDER — HYDRALAZINE HCL 20 MG/ML IJ SOLN: 5.0000 mg | Freq: Three times a day (TID) | INTRAMUSCULAR | Status: DC

## 2024-06-04 NOTE — Progress Notes (Signed)
 " Progress Note   Patient: Maria Fuller FMW:[id number] DOB: [birth year] DOA: 06/03/2024     1 DOS: the patient was seen and examined on 06/04/2024   Brief hospital course: Partly taken from H&P.   Socorro Kanitz Tingler is a 39 y.o. female with medical history significant of  bypass surgery,  recurrent small bowel obstruction, depression/anxiety,  DM type 2, and PCOS who presented to the hospital with complaint with complaint of abdominal pain, associated with nausea and vomiting.  Patient with recent admission less than a month ago with similar symptoms and was diagnosed with ileitis, symptoms improved with NG tube decompression. History of recurrent admissions for similar reasons.  On presentation vital and labs stable,. Abdominal X-ray which demonstrated diffuse gaseous diltation of the colon. No definite small bowel dilatation is identified. CT abdomen demonstrated 5 cm segment of small bowel- small bowel intussusception in the right lower quadrant. No evidence of obstruction or definitive pathologic lead point.   General surgery was consulted.  1/31: Vital stable, repeat KUB with mild distention of colon likely due to ileitis, seems stable as compared to the prior studies.  Had a very small amount of diarrhea this morning, diet advanced to full liquid.  Assessment and Plan: * Small bowel intussusception (HCC) Repeat KUB with mildly dilated bowel, seems stable.  Had a small amount of diarrhea.  General surgery was consulted-pending formal consult. No more nausea, vomiting or pain. -Diet advanced to full liquid -Follow-up formal surgery recommendations - Continue with supportive care  HTN (hypertension) Blood pressure currently within goal. -Home Lasix  was held -Patient is not on any other medications at this time  Diabetes mellitus without complication (HCC) Diabetes was mentioned in her history, not on any medications at home.  CBG within goal. -Check A1c  Depression with  anxiety On multiple medications at home which are being continued  Obstructive sleep apnea - Continue CPAP at night   Subjective: Patient was seen and examined today.  Denies any more nausea, vomiting or abdominal pain.  Had a small amount of diarrhea earlier in the morning.  She wants to go home, asking to restart her home anxiety medications.  Physical Exam: Vitals:   06/03/24 2256 06/03/24 2300 06/04/24 0341 06/04/24 0958  BP: (!) 148/90  112/70 124/80  Pulse: 60  (!) 52 (!) 49  Resp: 16  16 16   Temp: 98.8 F (37.1 C)  97.7 F (36.5 C) 98.9 F (37.2 C)  TempSrc: Oral     SpO2: 100%  99% 98%  Weight: 115.3 kg 115.3 kg    Height: 6' 1 (1.854 m) 6' 1 (1.854 m)     General.  Obese lady, in no acute distress. Pulmonary.  Lungs clear bilaterally, normal respiratory effort. CV.  Regular rate and rhythm, no JVD, rub or murmur. Abdomen.  Soft, nontender, nondistended, BS positive. CNS.  Alert and oriented .  No focal neurologic deficit. Extremities.  No edema,  pulses intact and symmetrical. Psychiatry.  Judgment and insight appears normal.   Data Reviewed: Prior data reviewed  Family Communication: Discussed with patient  Disposition: Status is: Inpatient Remains inpatient appropriate because: Severity of illness  Planned Discharge Destination: Home  Time spent: 50 minutes  This record has been created using Conservation officer, historic buildings. Errors have been sought and corrected,but may not always be located. Such creation errors do not reflect on the standard of care.   Author: Amaryllis Dare, MD 06/04/2024 3:39 PM  For on call review [email or web address].  "

## 2024-06-04 NOTE — Assessment & Plan Note (Signed)
 Diabetes was mentioned in her history, not on any medications at home.  CBG within goal. -Check A1c

## 2024-06-04 NOTE — Assessment & Plan Note (Signed)
 On multiple medications at home which are being continued

## 2024-06-04 NOTE — Assessment & Plan Note (Signed)
 Repeat KUB with mildly dilated bowel, seems stable.  Had a small amount of diarrhea.  General surgery was consulted-pending formal consult. No more nausea, vomiting or pain. -Diet advanced to full liquid -Follow-up formal surgery recommendations - Continue with supportive care

## 2024-06-04 NOTE — Assessment & Plan Note (Signed)
-  Continue CPAP at night

## 2024-06-04 NOTE — Hospital Course (Addendum)
 Partly taken from H&P.   Maria Fuller is a 39 y.o. female with medical history significant of  bypass surgery,  recurrent small bowel obstruction, depression/anxiety,  DM type 2, and PCOS who presented to the hospital with complaint with complaint of abdominal pain, associated with nausea and vomiting.  Patient with recent admission less than a month ago with similar symptoms and was diagnosed with ileitis, symptoms improved with NG tube decompression. History of recurrent admissions for similar reasons.  On presentation vital and labs stable,. Abdominal X-ray which demonstrated diffuse gaseous diltation of the colon. No definite small bowel dilatation is identified. CT abdomen demonstrated 5 cm segment of small bowel- small bowel intussusception in the right lower quadrant. No evidence of obstruction or definitive pathologic lead point.   General surgery was consulted.  1/31: Vital stable, repeat KUB with mild distention of colon likely due to ileitis, seems stable as compared to the prior studies.  Had a very small amount of diarrhea this morning, diet advanced to full liquid.  2/1: Remained hemodynamically stable, symptoms improved, able to tolerate advancement in diet.  Repeat imaging multiple nondilated air-filled loops of large and small bowel.  Repeat CT with no obstruction or intussusception.  Discussed with general surgery and they are recommending followed up with her [facility] surgeons ordered her gastric bypass.  Patient is being discharged with no new medications and advised to follow-up with her providers for further assistance.  She will continue with her home medications.

## 2024-06-04 NOTE — Plan of Care (Signed)

## 2024-06-04 NOTE — Consult Note (Signed)
 Patient ID: Maria Fuller, female   DOB: Mar 07, 1986, 39 y.o.   MRN: [id number]  HPI [name] is a 39 y.o. female seen in consultation for abd pain. 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 She now follows up at [facility] and has had recurrent episode of obstructions that are self limited   Pt is s/p 08/28/23 Diagnostic Laparoscopy  SHe does have chronic RLQ pain, intermittent dull, moderate w nausea and vomiting.intermittent, she did experience some more today and yesterday, CT pers reviewed w ? intussusception , No evidence of obstruction or ischemia Cbc demonstrated wbc 6.3,  hb/hct 10.2/31.6, and platelet 301. Chemistry demonstrated Na 138, K 3.9, Cl 110, bicarb 21, Bun/Cr 13/0.55 and glucose 91.  Toleraiting clears   HPI  Past Medical History:  Diagnosis Date   Anemia    slight   Bradycardia    Depression    Diabetes mellitus without complication (HCC)    resolved with weight loss from gastric bypass   Headache    migraines. resolved since gastric bypass   High cholesterol    resolved since weight loss surgery   History of kidney stones    last week   Hypertension    resolved since gastric bypass, severe pre-eclampsia with G1   Incarcerated incisional hernia 07/27/2021   Low BP    Meningitis 2015   Partial small bowel obstruction (HCC) 08/04/2019   PCOS (polycystic ovarian syndrome)    Preterm labor    Recurrent ventral incisional hernia    Sleep apnea    had gastric bypass surgery and now resolved   Small bowel obstruction (HCC)    Syncope    Vertigo couple years ago    Past Surgical History:  Procedure Laterality Date   BIOPSY  11/26/2022   Procedure: BIOPSY;  Surgeon: Unk Corinn Skiff, MD;  Location: [facility] ENDOSCOPY;  Service: Gastroenterology;;   CESAREAN SECTION     CESAREAN SECTION N/A 05/07/2016   Procedure: CESAREAN SECTION;  Surgeon: Lamar SHAUNNA Lesches, MD;  Location: [facility] ORS;  Service: Obstetrics;  Laterality: N/A;  Time of birth: [birth year] Sex: Female Weight: 3940 kg, 8 lb 11 oz   CHOLECYSTECTOMY     COLON SURGERY     COLONOSCOPY WITH PROPOFOL  N/A 03/16/2023   Procedure: COLONOSCOPY WITH PROPOFOL ;  Surgeon: Maryruth Ole DASEN, MD;  Location: [facility] ENDOSCOPY;  Service: Endoscopy;  Laterality: N/A;   CYSTOSCOPY WITH STENT PLACEMENT Right 09/19/2016   Procedure: CYSTOSCOPY WITH STENT PLACEMENT;  Surgeon: Chauncey Redell Agent, MD;  Location: [facility] OR[facility];  Service: Urology;  Laterality: Right;   CYSTOSCOPY WITH STENT PLACEMENT Left 04/18/2022   Procedure: CYSTOSCOPY WITH STENT PLACEMENT;  Surgeon: Watt Rush, MD;  Location: [facility];  Service: Urology;  Laterality: Left;   CYSTOSCOPY/URETEROSCOPY/HOLMIUM LASER/STENT PLACEMENT Left 05/08/2022   Procedure: CYSTOSCOPY/URETEROSCOPY/HOLMIUM LASER/STENT EXCHANGE;  Surgeon: Penne Knee, MD;  Location: [facility];  Service: Urology;  Laterality: Left;   DIAGNOSTIC LAPAROSCOPY     ESOPHAGOGASTRODUODENOSCOPY (EGD) WITH PROPOFOL  N/A 11/23/2022   Procedure: ESOPHAGOGASTRODUODENOSCOPY (EGD) WITH PROPOFOL ;  Surgeon: Therisa Bi, MD;  Location: [facility] ENDOSCOPY;  Service: Gastroenterology;  Laterality: N/A;   ESOPHAGOGASTRODUODENOSCOPY (EGD) WITH PROPOFOL  N/A 11/26/2022   Procedure: ESOPHAGOGASTRODUODENOSCOPY (EGD) WITH PROPOFOL ;  Surgeon: Unk Corinn Skiff, MD;  Location: [facility];  Service: Gastroenterology;  Laterality: N/A;   EXTRACORPOREAL SHOCK WAVE LITHOTRIPSY Left 05/15/2022   Procedure: EXTRACORPOREAL SHOCK WAVE  LITHOTRIPSY (ESWL);  Surgeon: Penne Knee, MD;  Location: [facility];  Service: Urology;  Laterality: Left;   FLEXIBLE SIGMOIDOSCOPY N/A 07/28/2019   Procedure: FLEXIBLE SIGMOIDOSCOPY;  Surgeon: Unk Corinn Skiff, MD;  Location: [facility];  Service: Endoscopy;  Laterality: N/A;   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   HERNIA REPAIR     INSERTION OF MESH N/A  07/07/2022   Procedure: INSERTION OF MESH;  Surgeon: Lane Shope, MD;  Location: [facility];  Service: General;  Laterality: N/A;   LAPAROSCOPIC OVARIAN CYSTECTOMY Right 09/28/2018   Procedure: LAPAROSCOPIC RIGHT OVARIAN CYSTECTOMY;  Surgeon: Arloa Lamar SQUIBB, MD;  Location: [facility];  Service: Gynecology;  Laterality: Right;   LAPAROSCOPIC UNILATERAL SALPINGO OOPHERECTOMY Right 07/25/2021   Procedure: LAPAROSCOPIC UNILATERAL SALPINGO OOPHORECTOMY;  Surgeon: Arloa Lamar SQUIBB, MD;  Location: [facility];  Service: Gynecology;  Laterality: Right;   LEFT HEART CATH AND CORONARY ANGIOGRAPHY Left 04/07/2018   Procedure: LEFT HEART CATH AND CORONARY ANGIOGRAPHY;  Surgeon: Florencio Cara BIRCH, MD;  Location: [facility];  Service: Cardiovascular;  Laterality: Left;   OOPHORECTOMY     POLYPECTOMY  03/16/2023   Procedure: POLYPECTOMY;  Surgeon: Maryruth Ole DASEN, MD;  Location: [facility] ENDOSCOPY;  Service: Endoscopy;;   small bowel obstruction  07/2019   TONSILECTOMY, ADENOIDECTOMY, BILATERAL MYRINGOTOMY AND TUBES     TONSILLECTOMY     URETEROSCOPY WITH HOLMIUM LASER LITHOTRIPSY Right 09/19/2016   Procedure: URETEROSCOPY WITH HOLMIUM LASER LITHOTRIPSY;  Surgeon: Chauncey Redell Agent, MD;  Location: [facility];  Service: Urology;  Laterality: Right;   VENTRAL HERNIA REPAIR     scheduled for 07/07/2022   XI ROBOTIC ASSISTED VENTRAL HERNIA N/A 07/27/2021   Procedure: XI ROBOTIC ASSISTED INCARCERATED INCISIONAL HERNIA REPAIR;  Surgeon: Lane Shope, MD;  Location: [facility];  Service: General;  Laterality: N/A;   XI ROBOTIC ASSISTED VENTRAL HERNIA N/A 07/07/2022   Procedure: XI ROBOTIC ASSISTED VENTRAL HERNIA, recurrent incisional with mesh;  Surgeon: Lane Shope, MD;  Location: [facility] OR[facility];  Service: General;  Laterality: N/A;    Family History  Problem Relation Age of Onset   Polycystic ovary syndrome Mother    Diabetes Father    Bladder Cancer Father    Depression Father    Endometriosis  Sister    Polycystic ovary syndrome Sister    Breast cancer Paternal Grandmother    Diabetes Paternal Grandmother    Diabetes Paternal Grandfather    Kidney cancer Neg Hx    Prostate cancer Neg Hx     Social History Social History[1]  Allergies[2]  Current Facility-Administered Medications  Medication Dose Route Frequency Provider Last Rate Last Admin   0.9 %  sodium chloride  infusion   Intravenous Continuous Stephens, Tiona K, MD 100 mL/hr at 06/04/24 0810 New Bag at 06/04/24 0810   acetaminophen  (TYLENOL ) tablet 650 mg  650 mg Oral Q6H PRN Stephens, Tiona K, MD       Or   acetaminophen  (TYLENOL ) suppository 650 mg  650 mg Rectal Q6H PRN Stephens, Tiona K, MD       clonazePAM  (KLONOPIN ) tablet 0.5 mg  0.5 mg Oral Daily Stephens, Tiona K, MD       enoxaparin  (LOVENOX ) injection 55 mg  0.5 mg/kg Subcutaneous Q24H Stephens, Tiona K, MD   55 mg at 06/03/24 2322   methocarbamol  (ROBAXIN ) tablet 1,000 mg  1,000 mg Oral TID Stephens, Tiona K, MD   1,000 mg at 06/03/24 2322   morphine  (PF) 2 MG/ML injection 2  mg  2 mg Intravenous Q4H PRN Stephens, Tiona K, MD   2 mg at 06/04/24 9240   ondansetron  (ZOFRAN ) tablet 4 mg  4 mg Oral Q6H PRN Stephens, Tiona K, MD       Or   ondansetron  (ZOFRAN ) injection 4 mg  4 mg Intravenous Q6H PRN Stephens, Tiona K, MD   4 mg at 06/04/24 0130   pantoprazole  (PROTONIX ) EC tablet 40 mg  40 mg Oral BID AC Stephens, Tiona K, MD       traZODone  (DESYREL ) tablet 50 mg  50 mg Oral QHS Stephens, Tiona K, MD   50 mg at 06/03/24 2322     Review of Systems Full ROS  was asked and was negative except for the information on the HPI  Physical Exam Blood pressure 124/80, pulse (!) 49, temperature 98.9 F (37.2 C), resp. rate 16, height 6' 1 (1.854 m), weight 115.3 kg, SpO2 98%. CONSTITUTIONAL: NAD. EYES: Pupils are equal, round, Sclera are non-icteric. EARS, NOSE, MOUTH AND THROAT: The oropharynx is clear. The oral mucosa is pink and moist. Hearing is intact to  voice. LYMPH NODES:  Lymph nodes in the neck are normal. RESPIRATORY:  Lungs are clear. There is normal respiratory effort, with equal breath sounds bilaterally, and without pathologic use of accessory muscles. CARDIOVASCULAR: Heart is regular without murmurs, gallops, or rubs. GI: The abdomen is  soft, mild tenderness RLQ w/o rebound There are no palpable masses. There is no hepatosplenomegaly. There are normal bowel sounds  GU: Rectal deferred.   MUSCULOSKELETAL: Normal muscle strength and tone. No cyanosis or edema.   SKIN: Turgor is good and there are no pathologic skin lesions or ulcers. NEUROLOGIC: Motor and sensation is grossly normal. Cranial nerves are grossly intact. PSYCH:  Oriented to person, place and time. Affect is normal.  Data Reviewed I have personally reviewed the patient's imaging, laboratory findings and medical records.    Assessment/Plan 39 yo hx duodenal switch and recurrent abd pain w ? Intussusception, no need for emergent intervention. We will order repeat CT w po contrast to further assess area. Complex situation as her prior bariatric surgery makes SB evaluation tricky and difficult. SHe has already seen [facility] and if not emergent may need to go back to them for potential revision. D/W the pt in detail fortunately she does not need emergent intervention at this time  We will continue to follow I personally spent a total of 75 minutes in the care of the patient today including performing a medically appropriate exam/evaluation, counseling and educating, placing orders, referring and communicating with other health care professionals, documenting clinical information in the EHR, independently interpreting and reviewing images studies and coordinating care.    Laneta Luna, MD FACS General Surgeon 06/04/2024, 10:20 AM      [1]  Social History Tobacco Use   Smoking status: Never    Passive exposure: Never   Smokeless tobacco: Never  Vaping Use    Vaping status: Never Used  Substance Use Topics   Alcohol use: No   Drug use: No  [2]  Allergies Allergen Reactions   Cefuroxime Dermatitis   Ceftin [Cefuroxime Axetil] Rash    Tolerated cefazolin  before   Penicillins Rash and Other (See Comments)    TOLERATED CEFAZOLIN  BEFORE   Prochlorperazine  Other (See Comments) and Anxiety    Panic attack

## 2024-06-04 NOTE — Progress Notes (Signed)
" ° °  RUR: low risk  CM review chart, anticipate no needs. Following for discharge planning.   Maria Fuller CM   06/04/24 0840  TOC Brief Assessment  Insurance and Status Reviewed  Patient has primary care physician Yes  Prior/Current Home Services No current home services  Social Drivers of Health Review SDOH reviewed no interventions necessary  Readmission risk has been reviewed Yes  Transition of care needs no transition of care needs at this time    "

## 2024-06-05 ENCOUNTER — Inpatient Hospital Stay

## 2024-06-05 DIAGNOSIS — K561 Intussusception: Secondary | ICD-10-CM | POA: Diagnosis not present

## 2024-06-05 LAB — CBC
HCT: 28.2 % — ABNORMAL LOW (ref 36.0–46.0)
Hemoglobin: 9.3 g/dL — ABNORMAL LOW (ref 12.0–15.0)
MCH: 30 pg (ref 26.0–34.0)
MCHC: 33 g/dL (ref 30.0–36.0)
MCV: 91 fL (ref 80.0–100.0)
Platelets: 254 10*3/uL (ref 150–400)
RBC: 3.1 MIL/uL — ABNORMAL LOW (ref 3.87–5.11)
RDW: 14.3 % (ref 11.5–15.5)
WBC: 4.2 10*3/uL (ref 4.0–10.5)
nRBC: 0 % (ref 0.0–0.2)

## 2024-06-05 LAB — BASIC METABOLIC PANEL WITH GFR
Anion gap: 8 (ref 5–15)
BUN: 6 mg/dL (ref 6–20)
CO2: 23 mmol/L (ref 22–32)
Calcium: 7.7 mg/dL — ABNORMAL LOW (ref 8.9–10.3)
Chloride: 109 mmol/L (ref 98–111)
Creatinine, Ser: 0.53 mg/dL (ref 0.44–1.00)
GFR, Estimated: >60 mL/min
Glucose, Bld: 76 mg/dL (ref 70–99)
Potassium: 3.4 mmol/L — ABNORMAL LOW (ref 3.5–5.1)
Sodium: 140 mmol/L (ref 135–145)

## 2024-06-05 MED ORDER — POTASSIUM CHLORIDE CRYS ER 20 MEQ PO TBCR
30.0000 meq | EXTENDED_RELEASE_TABLET | Freq: Once | ORAL | Status: AC
  Administered 2024-06-05: 30 meq via ORAL
  Filled 2024-06-05: qty 1

## 2024-06-05 NOTE — Plan of Care (Signed)

## 2024-06-05 NOTE — Progress Notes (Signed)
 CC: Intussusception  Subjective: Repeat is good CT scan personally reviewed showing resolution of potential intussusception.  Patient is clinically doing well tolerating diet and ready to go home  Objective: Vital signs in last 24 hours: Temp:  [97.7 F (36.5 C)-98.6 F (37 C)] 98.2 F (36.8 C) (02/01 0923) Pulse Rate:  [49-56] 56 (02/01 0923) Resp:  [16-18] 18 (02/01 0923) BP: (127-138)/(79-89) 128/89 (02/01 0923) SpO2:  [97 %-100 %] 98 % (02/01 0923) Last BM Date : 06/02/24  Intake/Output from previous day: 01/31 0701 - 02/01 0700 In: 1626.9 [I.V.:1626.9] Out: -  Intake/Output this shift: No intake/output data recorded.  Physical exam:  NAD alert Abd: soft, nt N o rebound  Lab Results: CBC  Recent Labs    06/03/24 2336 06/05/24 0553  WBC 7.0 4.2  HGB 9.4* 9.3*  HCT 28.2* 28.2*  PLT 269 254   BMET Recent Labs    06/03/24 1358 06/03/24 2336 06/05/24 0553  NA 138  --  140  K 3.9  --  3.4*  CL 110  --  109  CO2 21*  --  23  GLUCOSE 91  --  76  BUN 13  --  6  CREATININE 0.55 0.51 0.53  CALCIUM 8.1*  --  7.7*   PT/INR No results for input(s): LABPROT, INR in the last 72 hours. ABG No results for input(s): PHART, HCO3 in the last 72 hours.  Invalid input(s): PCO2, PO2  Studies/Results: DG ABD ACUTE 2+V W 1V CHEST Result Date: 06/05/2024 EXAM: UPRIGHT AND SUPINE XRAY VIEWS OF THE ABDOMEN AND FRONTAL VIEW OF THE CHEST 06/05/2024 07:26:00 AM COMPARISON: None available. CLINICAL HISTORY: Small bowel obstruction (HCC). Shortness of breath. FINDINGS: LUNGS AND PLEURA: Low lung volumes with bibasilar atelectasis. No consolidation or pulmonary edema. No pleural effusion or pneumothorax. HEART AND MEDIASTINUM: No acute abnormality of the cardiac and mediastinal silhouettes. BOWEL: There are numerous nondilated air-filled loops of small and large bowel. Multiple air-fluid levels are identified on the upright projection radiograph the loop contrast material  is identified within right upper quadrant bowel loops in the projection of the liver. SABRA PERITONEUM AND SOFT TISSUES: Surgical clips are present in the right lower quadrant and right upper quadrant. An IUD overlies the pelvis. No abnormal calcifications. No free air. BONES: No acute osseous abnormality. IMPRESSION: 1. Multiple nondilated air-filled loops of large and small bowel with air-fluid levels on the upright AP projection radiograph. Imaging findings are nonspecific with differential considerations including acute diarrheal state, ileus, or mechanical obstruction. Clinical correlation is recommended, and if there are concerns for bowel obstruction, then repeat CT of the abdomen and pelvis would be advised as clinically indicated. 2. Low lung volumes with bibasilar atelectasis. Electronically signed by: Waddell Calk MD 06/05/2024 07:44 AM EST RP Workstation: [id number]   CT ABDOMEN PELVIS W CONTRAST Result Date: 06/04/2024 EXAM: CT ABDOMEN AND PELVIS WITH CONTRAST 06/04/2024 10:37:39 PM TECHNIQUE: CT of the abdomen and pelvis was performed with the administration of 100 mL of iohexol  (OMNIPAQUE ) 300 MG/ML solution. Multiplanar reformatted images are provided for review. Automated exposure control, iterative reconstruction, and/or weight-based adjustment of the mA/kV was utilized to reduce the radiation dose to as low as reasonably achievable. COMPARISON: CT abdomen and pelvis 05/07/2024. CLINICAL HISTORY: Bowel obstruction suspected. Suspected bowel obstruction. FINDINGS: LOWER CHEST: There is atelectasis in the lung bases. LIVER: The liver is unremarkable. GALLBLADDER AND BILE DUCTS: Gallbladder surgically absent. No biliary ductal dilatation. SPLEEN: No acute abnormality. PANCREAS: No acute abnormality. ADRENAL GLANDS:  No acute abnormality. KIDNEYS, URETERS AND BLADDER: There is a cyst in the left kidney measuring 14 mm. Per consensus, no follow-up is needed for simple Bosniak type 1 and 2 renal cysts,  unless the patient has a malignancy history or risk factors. No stones in the kidneys or ureters. No hydronephrosis. No perinephric or periureteral stranding. Urinary bladder is unremarkable. GI AND BOWEL: Postsurgical changes in the right colon. The appendix is not seen. There are also postsurgical changes in the stomach. Previously identified intussusception in the right lower quadrant is no longer seen. There is no bowel obstruction. PERITONEUM AND RETROPERITONEUM: There is a small amount of simple free fluid in the pelvis. No free air. VASCULATURE: Aorta is normal in caliber. LYMPH NODES: No lymphadenopathy. REPRODUCTIVE ORGANS: There is an IUD in the uterus. Multiloculated low attenuation/cystic area seen in the left adnexa likely related to the left ovary measuring 4.2 x 6.4 cm, similar to prior study. BONES AND SOFT TISSUES: Injection hematoma seen in the subcutaneous tissues of the anterior right abdominal wall which is unchanged. 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 IMPRESSION: 1. No evidence of bowel obstruction. Previously identified intussusception in the right lower quadrant is no longer seen. 2. Multiloculated low attenuation/cystic area in the left adnexa, likely related to the left ovary, measuring 4.2 x 6.4 cm, similar to prior study. This should be further evaluated with pelvic ultrasound. Electronically signed by: Greig Pique MD 06/04/2024 10:56 PM EST RP Workstation: [id number]   Abd 1 View (KUB) Result Date: 06/04/2024 EXAM: 1 VIEW XRAY OF THE ABDOMEN 06/04/2024 05:28:18 AM COMPARISON: 06/03/2024 CLINICAL HISTORY: Ileus (HCC). ICD10: K56.7 Ileus, unspecified. FINDINGS: BOWEL: Mild gaseous distention of the colon similar to prior studies, likely due to ileus. SOFT TISSUES: Surgical clips in right abdomen. IUD in pelvis. No abnormal calcifications. BONES: No acute fracture. IMPRESSION: 1. Mild gaseous  distention of the colon likely due to ileus, similar to prior studies. Electronically signed by: Waddell Calk MD 06/04/2024 07:25 AM EST RP Workstation: [id number]   CT ABDOMEN PELVIS W CONTRAST Result Date: 06/03/2024 EXAM: CT ABDOMEN AND PELVIS WITH CONTRAST 06/03/2024 07:18:30 PM TECHNIQUE: CT of the abdomen and pelvis was performed with the administration of 100 mL of iohexol  (OMNIPAQUE ) 300 MG/ML solution. Multiplanar reformatted images are provided for review. Automated exposure control, iterative reconstruction, and/or weight-based adjustment of the mA/kV was utilized to reduce the radiation dose to as low as reasonably achievable. COMPARISON: 05/10/2024 CLINICAL HISTORY: Bowel obstruction suspected. FINDINGS: LOWER CHEST: No acute abnormality. LIVER: The liver is unremarkable. GALLBLADDER AND BILE DUCTS: Cholecystectomy. No biliary ductal dilatation. SPLEEN: No acute abnormality. PANCREAS: No acute abnormality. ADRENAL GLANDS: No acute abnormality. KIDNEYS, URETERS AND BLADDER: No stones in the kidneys or ureters. No hydronephrosis. No perinephric or periureteral stranding. Urinary bladder is unremarkable. GI AND BOWEL: Postoperative change about the stomach. 5 cm small bowel - small bowel intussusception in the right lower quadrant (series 2 image 63 and series 5 image 45). A pathologic lead point is not definitively identified. No evidence of obstruction or ischemia. Postoperative change about the right colon. PERITONEUM AND RETROPERITONEUM: No ascites. No free air. VASCULATURE: Aorta is normal in caliber. LYMPH NODES: No lymphadenopathy. REPRODUCTIVE ORGANS: IUD in the uterus. BONES AND SOFT TISSUES: No acute osseous abnormality. Chronic L5 spondylolysis with mild spondylolisthesis. IMPRESSION: 1. 5 cm segment of small bowel-small bowel intussusception in the right lower quadrant. No evidence of obstruction or definitive pathologic  lead point. Short follow-up imaging recommended to ensure resolution.  Electronically signed by: Norman Gatlin MD 06/03/2024 08:47 PM EST RP Workstation: [id number]   DG Abdomen 1 View Result Date: 06/03/2024 CLINICAL DATA:  Generalized abdominal pain since last night. Prior history of bowel obstruction. EXAM: ABDOMEN - 1 VIEW COMPARISON:  05/09/2024 FINDINGS: Diffuse gaseous dilatation of the colon. No definite small bowel dilatation is identified. IUD seen within the central pelvis. No acute osseous abnormality. IMPRESSION: Diffuse gaseous dilatation of the colon, possibly due to ileus. No small bowel dilatation identified. Electronically Signed   By: Aliene Lloyd M.D.   On: 06/03/2024 15:56    Anti-infectives: Anti-infectives (From admission, onward)    None       Assessment/Plan: Resolved intussusception.  Encouraged to follow-up with [facility].  No need for surgical intervention I personally spent a total of 35 minutes in the care of the patient today including performing a medically appropriate exam/evaluation, counseling and educating, placing orders, referring and communicating with other health care professionals, documenting clinical information in the EHR, independently interpreting and reviewing images studies and coordinating care.     Laneta Luna, MD, [facility]  06/05/2024

## 2024-06-05 NOTE — Plan of Care (Signed)
" °  Problem: Clinical Measurements: Goal: Ability to maintain clinical measurements within normal limits will improve Outcome: Progressing   Problem: Pain Managment: Goal: General experience of comfort will improve and/or be controlled Outcome: Progressing   Problem: Elimination: Goal: Will not experience complications related to bowel motility Outcome: Progressing   "
